# Patient Record
Sex: Male | Born: 1945 | Race: White | Hispanic: No | Marital: Married | Smoking: Former smoker
Health system: Southern US, Community
[De-identification: ages and names within clinical notes are randomized; demographics above are authoritative.]

## PROBLEM LIST (undated history)

## (undated) DIAGNOSIS — S6991XA Unspecified injury of right wrist, hand and finger(s), initial encounter: Secondary | ICD-10-CM

## (undated) DIAGNOSIS — K219 Gastro-esophageal reflux disease without esophagitis: Secondary | ICD-10-CM

## (undated) DIAGNOSIS — H269 Unspecified cataract: Secondary | ICD-10-CM

## (undated) DIAGNOSIS — IMO0002 Reserved for concepts with insufficient information to code with codable children: Secondary | ICD-10-CM

## (undated) DIAGNOSIS — Z87442 Personal history of urinary calculi: Secondary | ICD-10-CM

## (undated) DIAGNOSIS — I4892 Unspecified atrial flutter: Secondary | ICD-10-CM

## (undated) DIAGNOSIS — M1990 Unspecified osteoarthritis, unspecified site: Secondary | ICD-10-CM

## (undated) DIAGNOSIS — J8410 Pulmonary fibrosis, unspecified: Secondary | ICD-10-CM

## (undated) DIAGNOSIS — T7840XA Allergy, unspecified, initial encounter: Secondary | ICD-10-CM

## (undated) DIAGNOSIS — Z8719 Personal history of other diseases of the digestive system: Secondary | ICD-10-CM

## (undated) DIAGNOSIS — E785 Hyperlipidemia, unspecified: Secondary | ICD-10-CM

## (undated) DIAGNOSIS — I499 Cardiac arrhythmia, unspecified: Secondary | ICD-10-CM

## (undated) DIAGNOSIS — N200 Calculus of kidney: Secondary | ICD-10-CM

## (undated) HISTORY — DX: Reserved for concepts with insufficient information to code with codable children: IMO0002

## (undated) HISTORY — PX: HAND SURGERY: SHX662

## (undated) HISTORY — PX: UPPER GASTROINTESTINAL ENDOSCOPY: SHX188

## (undated) HISTORY — PX: TONSILLECTOMY: SUR1361

## (undated) HISTORY — DX: Gastro-esophageal reflux disease without esophagitis: K21.9

## (undated) HISTORY — DX: Hyperlipidemia, unspecified: E78.5

## (undated) HISTORY — DX: Unspecified cataract: H26.9

## (undated) HISTORY — DX: Unspecified atrial flutter: I48.92

## (undated) HISTORY — DX: Unspecified injury of right wrist, hand and finger(s), initial encounter: S69.91XA

## (undated) HISTORY — DX: Allergy, unspecified, initial encounter: T78.40XA

## (undated) HISTORY — DX: Pulmonary fibrosis, unspecified: J84.10

## (undated) HISTORY — PX: POLYPECTOMY: SHX149

---

## 2005-12-14 HISTORY — PX: ESOPHAGOGASTRODUODENOSCOPY: SHX1529

## 2005-12-14 LAB — HM COLONOSCOPY: HM Colonoscopy: 2007

## 2007-12-29 ENCOUNTER — Emergency Department (HOSPITAL_COMMUNITY): Admission: EM | Admit: 2007-12-29 | Discharge: 2007-12-29 | Payer: Self-pay | Admitting: Emergency Medicine

## 2007-12-30 ENCOUNTER — Emergency Department (HOSPITAL_COMMUNITY): Admission: EM | Admit: 2007-12-30 | Discharge: 2007-12-30 | Payer: Self-pay | Admitting: Emergency Medicine

## 2009-02-05 ENCOUNTER — Encounter: Payer: Self-pay | Admitting: Family Medicine

## 2010-01-29 ENCOUNTER — Ambulatory Visit: Payer: Self-pay | Admitting: Family Medicine

## 2010-01-29 DIAGNOSIS — K219 Gastro-esophageal reflux disease without esophagitis: Secondary | ICD-10-CM | POA: Insufficient documentation

## 2010-12-17 ENCOUNTER — Other Ambulatory Visit: Payer: Self-pay | Admitting: Family Medicine

## 2010-12-17 ENCOUNTER — Ambulatory Visit
Admission: RE | Admit: 2010-12-17 | Discharge: 2010-12-17 | Payer: Self-pay | Source: Home / Self Care | Attending: Family Medicine | Admitting: Family Medicine

## 2010-12-17 LAB — LIPID PANEL
Cholesterol: 212 mg/dL — ABNORMAL HIGH (ref 0–200)
HDL: 53.6 mg/dL (ref >39.00)
Total CHOL/HDL Ratio: 4
Triglycerides: 81 mg/dL (ref 0.0–149.0)
VLDL: 16.2 mg/dL (ref 0.0–40.0)

## 2010-12-17 LAB — CBC WITH DIFFERENTIAL/PLATELET
Basophils Absolute: 0 10*3/uL (ref 0.0–0.1)
Basophils Relative: 0.6 % (ref 0.0–3.0)
Eosinophils Absolute: 0.2 10*3/uL (ref 0.0–0.7)
Eosinophils Relative: 3 % (ref 0.0–5.0)
HCT: 45.5 % (ref 39.0–52.0)
Hemoglobin: 15.5 g/dL (ref 13.0–17.0)
Lymphocytes Relative: 34.6 % (ref 12.0–46.0)
Lymphs Abs: 2.8 10*3/uL (ref 0.7–4.0)
MCHC: 34.1 g/dL (ref 30.0–36.0)
MCV: 96.8 fl (ref 78.0–100.0)
Monocytes Absolute: 0.9 10*3/uL (ref 0.1–1.0)
Monocytes Relative: 10.6 % (ref 3.0–12.0)
Neutro Abs: 4.1 10*3/uL (ref 1.4–7.7)
Neutrophils Relative %: 51.2 % (ref 43.0–77.0)
Platelets: 262 10*3/uL (ref 150.0–400.0)
RBC: 4.7 Mil/uL (ref 4.22–5.81)
RDW: 13.8 % (ref 11.5–14.6)
WBC: 8.1 10*3/uL (ref 4.5–10.5)

## 2010-12-17 LAB — CONVERTED CEMR LAB
Blood in Urine, dipstick: NEGATIVE
Glucose, Urine, Semiquant: NEGATIVE
Nitrite: NEGATIVE
Specific Gravity, Urine: >=1.03
Urobilinogen, UA: 0.2
WBC Urine, dipstick: NEGATIVE
pH: 5.5

## 2010-12-17 LAB — TSH: TSH: 1.58 u[IU]/mL (ref 0.35–5.50)

## 2010-12-17 LAB — PSA: PSA: 0.67 ng/mL (ref 0.10–4.00)

## 2010-12-17 LAB — LDL CHOLESTEROL, DIRECT: Direct LDL: 137.9 mg/dL

## 2010-12-17 LAB — BASIC METABOLIC PANEL
BUN: 17 mg/dL (ref 6–23)
CO2: 30 mEq/L (ref 19–32)
Calcium: 9.3 mg/dL (ref 8.4–10.5)
Chloride: 101 mEq/L (ref 96–112)
Creatinine, Ser: 0.9 mg/dL (ref 0.4–1.5)
GFR: 89.28 mL/min (ref >60.00)
Glucose, Bld: 93 mg/dL (ref 70–99)
Potassium: 4.7 mEq/L (ref 3.5–5.1)
Sodium: 138 mEq/L (ref 135–145)

## 2010-12-17 LAB — HEPATIC FUNCTION PANEL
ALT: 16 U/L (ref 0–53)
AST: 26 U/L (ref 0–37)
Albumin: 3.9 g/dL (ref 3.5–5.2)
Alkaline Phosphatase: 54 U/L (ref 39–117)
Bilirubin, Direct: 0.1 mg/dL (ref 0.0–0.3)
Total Bilirubin: 0.8 mg/dL (ref 0.3–1.2)
Total Protein: 7.3 g/dL (ref 6.0–8.3)

## 2010-12-24 ENCOUNTER — Ambulatory Visit: Admit: 2010-12-24 | Payer: Self-pay | Admitting: Family Medicine

## 2011-01-10 ENCOUNTER — Encounter: Payer: Self-pay | Admitting: Family Medicine

## 2011-01-15 NOTE — Letter (Signed)
Summary: Records from [facility] [place]  Records from [facility] [place]   Imported By: Laural Benes 02/18/2010 11:09:19  _____________________________________________________________________  External Attachment:    Type:   Image     Comment:   External Document

## 2011-01-15 NOTE — Assessment & Plan Note (Signed)
Summary: NEW PT EST // RS   Vital Signs:  Patient profile:   65 year old male Height:      67.5 inches Weight:      188 pounds BMI:     29.12 Temp:     98.4 degrees F oral Pulse rate:   87 / minute BP sitting:   122 / 74  (left arm) Cuff size:   large  Vitals Entered By: Townsend Roger, [place] (January 29, 2010 2:49 PM) CC: establish   History of Present Illness: 65 yr old male to establish with [place] after transfering from Dr. Carolann Littler at what was then called [facility]. He is doing well in general but he is still strugglig with his right hand and wrist. This was damaged on 12-29-07 on the job when the hand was caught in a piece of equipment. He sustained severe crush injuries, fractures, and burns. he had a number of surgeries to repair the damage, and he lost the distal 2/3 of the right 4th finger. This was taken care of by Dr. Vernona Rieger of the Plastic Surgery Dept. at [facility]. The last time he saw Dr. Vernona Rieger was in June 2010. 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 He attempted to return to work with limitations, but he was unable to do anything since he is right handed, and this injury has left him with almost no use of the hand. He has numbness, weakness, and very limited ROM of the hand and fingers. He cannot grip very well. Of course he had extensive PT and OT for over a year, but he was able to regain almost no use of this hand. He is considering applying for permanent total disability but has not done so yet. Also, he has a hx of severe GERD. He has been taking Pantoprazole once a day for some time in the mornings, but he gets a lot of breakthrough symptoms in the evenings and during the nights.   Preventive Screening-Counseling & Management  Alcohol-Tobacco     Smoking Status: quit  Current Medications (verified): 1)  Protonix 40 Mg  Tbec (Pantoprazole Sodium) .... Once Daily  Allergies  (verified): No Known Drug Allergies  Past History:  Past Medical History: Ulcers, resolved GERD permanent damage to the right hand after a workplace injury on 12-29-07  Past Surgical History: multiple surgeries to the right hand in 2009 and 2010 per Dr. Vernona Rieger at [facility] colonoscopy 2007, several benign polyps removed, repeat in 5 yrs EGD in 2007, GERD only  Family History: Reviewed history and no changes required. Family History Diabetes 1st degree relative Family History of Prostate CA 1st degree relative <50  Social History: Reviewed history and no changes required. Married Former Smoker (quit in late 1980s) Alcohol use-no disabled Smoking Status:  quit  Review of Systems  The patient denies anorexia, fever, weight loss, weight gain, vision loss, decreased hearing, hoarseness, chest pain, syncope, dyspnea on exertion, peripheral edema, prolonged cough, headaches, hemoptysis, abdominal pain, melena, hematochezia, hematuria, incontinence, genital sores, muscle weakness, suspicious skin lesions, transient blindness, difficulty walking, depression, unusual weight change, abnormal bleeding, enlarged lymph nodes, angioedema, breast masses, and testicular masses.    Physical Exam  General:  Well-developed,well-nourished,in no acute distress; alert,appropriate and cooperative throughout examination Neck:  No deformities, masses, or tenderness noted. Lungs:  Normal respiratory effort, chest expands symmetrically. Lungs are clear to auscultation, no crackles or wheezes. Heart:  Normal rate and regular rhythm. S1 and S2 normal without gallop, murmur, click, rub or other extra sounds. Abdomen:  Bowel sounds positive,abdomen soft and non-tender without masses, organomegaly or hernias noted. Extremities:  right hand shows extensive burn scars, is missing the distal end of the 4th finger, fair  grip strength, unable to fully flex or extend the fingers   Impression &  Recommendations:  Problem # 1:  CRUSHING INJURY OF HAND (ICD-927.20)  Problem # 2:  BURN, THIRD DEGREE, HAND (ICD-944.30)  Problem # 3:  GERD (ICD-530.81)  His updated medication list for this problem includes:    Protonix 40 Mg Tbec (Pantoprazole sodium) .Marland Kitchen..Marland Kitchen Two times a day  Complete Medication List: 1)  Protonix 40 Mg Tbec (Pantoprazole sodium) .... Two times a day  Patient Instructions: 1)  get old records. we will have him come back soon for a cpx with labs. Increase Pantoprazole to two times a day  Prescriptions: PROTONIX 40 MG  TBEC (PANTOPRAZOLE SODIUM) two times a day  #180 x 3   Entered and Authorized by:   [name] MD   Signed by:   [name] MD on 01/29/2010   Method used:   Print then Give to Patient   RxID:   [id number]

## 2011-01-26 ENCOUNTER — Encounter: Payer: Self-pay | Admitting: Family Medicine

## 2011-01-26 ENCOUNTER — Ambulatory Visit (INDEPENDENT_AMBULATORY_CARE_PROVIDER_SITE_OTHER): Payer: PRIVATE HEALTH INSURANCE | Admitting: Family Medicine

## 2011-01-26 VITALS — BP 130/80 | HR 70 | Ht 68.0 in | Wt 192.0 lb

## 2011-01-26 DIAGNOSIS — K219 Gastro-esophageal reflux disease without esophagitis: Secondary | ICD-10-CM

## 2011-01-26 DIAGNOSIS — M79609 Pain in unspecified limb: Secondary | ICD-10-CM

## 2011-01-26 DIAGNOSIS — M79643 Pain in unspecified hand: Secondary | ICD-10-CM

## 2011-01-26 DIAGNOSIS — G4700 Insomnia, unspecified: Secondary | ICD-10-CM

## 2011-01-26 DIAGNOSIS — Z0000 Encounter for general adult medical examination without abnormal findings: Secondary | ICD-10-CM

## 2011-01-26 MED ORDER — PANTOPRAZOLE SODIUM 40 MG PO TBEC: 40.0000 mg | DELAYED_RELEASE_TABLET | Freq: Every day | ORAL | Status: DC

## 2011-01-26 MED ORDER — ZOLPIDEM TARTRATE 5 MG PO TABS: 5.0000 mg | ORAL_TABLET | Freq: Every evening | ORAL | Status: DC | PRN

## 2011-01-26 MED ORDER — CELECOXIB 200 MG PO CAPS: 200.0000 mg | ORAL_CAPSULE | Freq: Every day | ORAL | Status: DC

## 2011-01-26 NOTE — Progress Notes (Signed)
  Subjective:    Patient ID: Tanner Bishop, male    DOB: 1946/11/17, 65 y.o.   MRN: [id number]  HPI 65 yr old male for a cpx. He is doing well except for his chronic right hand pain. He was recently approved for disability, and he has settled his Worker's Comp claim.    Review of Systems  Constitutional: Negative.  Negative for fever, diaphoresis, activity change, appetite change, fatigue and unexpected weight change.  HENT: Negative.  Negative for hearing loss, ear pain, nosebleeds, congestion, sore throat, trouble swallowing, neck pain, neck stiffness, voice change and tinnitus.   Eyes: Negative.  Negative for photophobia, pain, discharge, redness and visual disturbance.  Respiratory: Negative.  Negative for apnea, cough, choking, chest tightness, shortness of breath, wheezing and stridor.   Cardiovascular: Negative.  Negative for chest pain, palpitations and leg swelling.  Gastrointestinal: Negative.  Negative for nausea, vomiting, abdominal pain, diarrhea, constipation, blood in stool, abdominal distention and rectal pain.  Genitourinary: Negative.  Negative for dysuria, urgency, frequency, flank pain, scrotal swelling, enuresis, difficulty urinating and testicular pain.  Musculoskeletal: Negative.  Negative for myalgias, back pain, joint swelling, arthralgias and gait problem.  Skin: Negative.  Negative for color change, pallor, rash and wound.  Neurological: Negative.  Negative for dizziness, tremors, seizures, syncope, speech difficulty, weakness, light-headedness, numbness and headaches.  Hematological: Negative.  Negative for adenopathy. Does not bruise/bleed easily.  Psychiatric/Behavioral: Negative.  Negative for hallucinations, behavioral problems, confusion, sleep disturbance, dysphoric mood and agitation. The patient is not nervous/anxious.        Objective:   Physical Exam  Constitutional: He appears well-developed and well-nourished. No distress.  HENT:  Head: Normocephalic  and atraumatic.  Right Ear: External ear normal.  Left Ear: External ear normal.  Nose: Nose normal.  Mouth/Throat: Oropharynx is clear and moist. No oropharyngeal exudate.  Eyes: Conjunctivae and EOM are normal. Pupils are equal, round, and reactive to light. Right eye exhibits no discharge. Left eye exhibits no discharge. No scleral icterus.  Neck: Normal range of motion. Neck supple. No JVD present. No thyromegaly present.  Cardiovascular: Normal rate, regular rhythm, normal heart sounds and intact distal pulses.  Exam reveals no gallop and no friction rub.   No murmur heard.      EKG normal   Pulmonary/Chest: Effort normal and breath sounds normal. No stridor. No respiratory distress. He has no wheezes. He has no rales. He exhibits no tenderness.  Abdominal: Soft. Normal appearance and bowel sounds are normal. He exhibits no distension, no abdominal bruit, no ascites and no mass. There is no hepatosplenomegaly. There is no tenderness. There is no rigidity, no rebound and no guarding. No hernia.  Musculoskeletal: Normal range of motion. He exhibits no edema and no tenderness.  Lymphadenopathy:    He has no cervical adenopathy.  Neurological: He is alert. He has normal reflexes. No cranial nerve deficit. He exhibits normal muscle tone. Coordination normal.  Skin: Skin is warm and dry. No rash noted. He is not diaphoretic. No erythema. No pallor.  Psychiatric: He has a normal mood and affect. His behavior is normal. Judgment and thought content normal.          Assessment & Plan:  Doing well. Refilled his meds.

## 2011-01-26 NOTE — Progress Notes (Signed)
  Subjective:    Patient ID: Tanner Bishop, male    DOB: July 03, 1946, 65 y.o.   MRN: [id number]  HPI    Review of Systems     Objective:   Physical Exam  Cardiovascular:       EKG normal           Assessment & Plan:

## 2011-02-23 ENCOUNTER — Other Ambulatory Visit: Payer: Self-pay | Admitting: *Deleted

## 2011-02-23 DIAGNOSIS — K219 Gastro-esophageal reflux disease without esophagitis: Secondary | ICD-10-CM

## 2011-02-23 NOTE — Telephone Encounter (Signed)
Protonix rx refill was written for qd but should have been written for bid.  Please correct and call in a new rx.

## 2011-02-24 MED ORDER — PANTOPRAZOLE SODIUM 40 MG PO TBEC: 40.0000 mg | DELAYED_RELEASE_TABLET | Freq: Two times a day (BID) | ORAL | Status: DC

## 2011-02-24 NOTE — Telephone Encounter (Signed)
rx for protonix changed to bid and resubmitted.

## 2011-07-06 ENCOUNTER — Telehealth: Payer: Self-pay | Admitting: Family Medicine

## 2011-07-06 NOTE — Telephone Encounter (Signed)
Pt now uses [employer] ins,and ins will not cover Pantoprazole 40 mg #60 without dr approval. Pt says that the insurance allowed pt 1 refill of this med, but will not fill any more until rcvng approval from dr.

## 2011-07-08 NOTE — Telephone Encounter (Signed)
I spoke with pt and he is going to call insurance company and get them to fax a form that states what they need.

## 2012-01-25 ENCOUNTER — Other Ambulatory Visit (INDEPENDENT_AMBULATORY_CARE_PROVIDER_SITE_OTHER): Payer: Medicare Other

## 2012-01-25 DIAGNOSIS — Z125 Encounter for screening for malignant neoplasm of prostate: Secondary | ICD-10-CM

## 2012-01-25 DIAGNOSIS — Z79899 Other long term (current) drug therapy: Secondary | ICD-10-CM

## 2012-01-25 DIAGNOSIS — Z0000 Encounter for general adult medical examination without abnormal findings: Secondary | ICD-10-CM

## 2012-01-25 LAB — BASIC METABOLIC PANEL
BUN: 16 mg/dL (ref 6–23)
CO2: 31 mEq/L (ref 19–32)
Calcium: 9 mg/dL (ref 8.4–10.5)
Chloride: 102 mEq/L (ref 96–112)
Creatinine, Ser: 1 mg/dL (ref 0.4–1.5)
GFR: 79.79 mL/min (ref >60.00)
Glucose, Bld: 98 mg/dL (ref 70–99)
Potassium: 4.8 mEq/L (ref 3.5–5.1)
Sodium: 138 mEq/L (ref 135–145)

## 2012-01-25 LAB — HEPATIC FUNCTION PANEL
ALT: 13 U/L (ref 0–53)
AST: 23 U/L (ref 0–37)
Albumin: 3.8 g/dL (ref 3.5–5.2)
Alkaline Phosphatase: 47 U/L (ref 39–117)
Bilirubin, Direct: 0.1 mg/dL (ref 0.0–0.3)
Total Bilirubin: 0.6 mg/dL (ref 0.3–1.2)
Total Protein: 6.9 g/dL (ref 6.0–8.3)

## 2012-01-25 LAB — POCT URINALYSIS DIPSTICK
Bilirubin, UA: NEGATIVE
Blood, UA: NEGATIVE
Glucose, UA: NEGATIVE
Ketones, UA: NEGATIVE
Leukocytes, UA: NEGATIVE
Nitrite, UA: NEGATIVE
Protein, UA: NEGATIVE
Spec Grav, UA: <=1.005
Urobilinogen, UA: 0.2
pH, UA: 6.5

## 2012-01-25 LAB — CBC WITH DIFFERENTIAL/PLATELET
Basophils Absolute: 0 10*3/uL (ref 0.0–0.1)
Basophils Relative: 0.6 % (ref 0.0–3.0)
Eosinophils Absolute: 0.2 10*3/uL (ref 0.0–0.7)
Eosinophils Relative: 2.3 % (ref 0.0–5.0)
HCT: 42.7 % (ref 39.0–52.0)
Hemoglobin: 14.4 g/dL (ref 13.0–17.0)
Lymphocytes Relative: 28.1 % (ref 12.0–46.0)
Lymphs Abs: 2.3 10*3/uL (ref 0.7–4.0)
MCHC: 33.8 g/dL (ref 30.0–36.0)
MCV: 96.5 fl (ref 78.0–100.0)
Monocytes Absolute: 0.8 10*3/uL (ref 0.1–1.0)
Monocytes Relative: 10.4 % (ref 3.0–12.0)
Neutro Abs: 4.7 10*3/uL (ref 1.4–7.7)
Neutrophils Relative %: 58.6 % (ref 43.0–77.0)
Platelets: 239 10*3/uL (ref 150.0–400.0)
RBC: 4.43 Mil/uL (ref 4.22–5.81)
RDW: 13.9 % (ref 11.5–14.6)
WBC: 8 10*3/uL (ref 4.5–10.5)

## 2012-01-25 LAB — PSA: PSA: 0.58 ng/mL (ref 0.10–4.00)

## 2012-01-25 LAB — TSH: TSH: 1.68 u[IU]/mL (ref 0.35–5.50)

## 2012-01-25 LAB — LIPID PANEL
Cholesterol: 189 mg/dL (ref 0–200)
HDL: 56.2 mg/dL (ref >39.00)
LDL Cholesterol: 122 mg/dL — ABNORMAL HIGH (ref 0–99)
Total CHOL/HDL Ratio: 3
Triglycerides: 53 mg/dL (ref 0.0–149.0)
VLDL: 10.6 mg/dL (ref 0.0–40.0)

## 2012-01-27 NOTE — Progress Notes (Signed)
Quick Note:  Spoke with pt ______

## 2012-02-01 ENCOUNTER — Ambulatory Visit (INDEPENDENT_AMBULATORY_CARE_PROVIDER_SITE_OTHER): Payer: Medicare Other | Admitting: Family Medicine

## 2012-02-01 ENCOUNTER — Encounter: Payer: Self-pay | Admitting: Family Medicine

## 2012-02-01 VITALS — BP 112/68 | HR 89 | Temp 98.2°F | Ht 69.0 in | Wt 188.0 lb

## 2012-02-01 DIAGNOSIS — K219 Gastro-esophageal reflux disease without esophagitis: Secondary | ICD-10-CM

## 2012-02-01 DIAGNOSIS — D126 Benign neoplasm of colon, unspecified: Secondary | ICD-10-CM

## 2012-02-01 DIAGNOSIS — Z0000 Encounter for general adult medical examination without abnormal findings: Secondary | ICD-10-CM

## 2012-02-01 DIAGNOSIS — K635 Polyp of colon: Secondary | ICD-10-CM

## 2012-02-01 DIAGNOSIS — G4700 Insomnia, unspecified: Secondary | ICD-10-CM

## 2012-02-01 MED ORDER — CELECOXIB 200 MG PO CAPS: 200.0000 mg | ORAL_CAPSULE | Freq: Every day | ORAL | Status: DC | PRN

## 2012-02-01 MED ORDER — ZOLPIDEM TARTRATE 5 MG PO TABS: 5.0000 mg | ORAL_TABLET | Freq: Every evening | ORAL | Status: DC | PRN

## 2012-02-01 MED ORDER — DICLOFENAC SODIUM 1 % TD GEL: 1.0000 "application " | Freq: Four times a day (QID) | TRANSDERMAL | Status: DC

## 2012-02-01 MED ORDER — PANTOPRAZOLE SODIUM 40 MG PO TBEC: 40.0000 mg | DELAYED_RELEASE_TABLET | Freq: Two times a day (BID) | ORAL | Status: DC

## 2012-02-01 NOTE — Progress Notes (Signed)
  Subjective:    Patient ID: Tanner Bishop, male    DOB: 12-03-1946, 66 y.o.   MRN: [id number]  HPI 66 yr old male for a cpx. He feels well but asks if there is a topical pain medicine he can use on his right hand. He has been on Celebrex for years.    Review of Systems  Constitutional: Negative.   HENT: Negative.   Eyes: Negative.   Respiratory: Negative.   Cardiovascular: Negative.   Gastrointestinal: Negative.   Genitourinary: Negative.   Musculoskeletal: Negative.   Skin: Negative.   Neurological: Negative.   Hematological: Negative.   Psychiatric/Behavioral: Negative.        Objective:   Physical Exam  Constitutional: He is oriented to person, place, and time. He appears well-developed and well-nourished. No distress.  HENT:  Head: Normocephalic and atraumatic.  Right Ear: External ear normal.  Left Ear: External ear normal.  Nose: Nose normal.  Mouth/Throat: Oropharynx is clear and moist. No oropharyngeal exudate.  Eyes: Conjunctivae and EOM are normal. Pupils are equal, round, and reactive to light. Right eye exhibits no discharge. Left eye exhibits no discharge. No scleral icterus.  Neck: Neck supple. No JVD present. No tracheal deviation present. No thyromegaly present.  Cardiovascular: Normal rate, regular rhythm, normal heart sounds and intact distal pulses.  Exam reveals no gallop and no friction rub.   No murmur heard.      EKG normal   Pulmonary/Chest: Effort normal and breath sounds normal. No respiratory distress. He has no wheezes. He has no rales. He exhibits no tenderness.  Abdominal: Soft. Bowel sounds are normal. He exhibits no distension and no mass. There is no tenderness. There is no rebound and no guarding.  Genitourinary: Rectum normal, prostate normal and penis normal. Guaiac negative stool. No penile tenderness.  Musculoskeletal: Normal range of motion. He exhibits no edema and no tenderness.  Lymphadenopathy:    He has no cervical adenopathy.    Neurological: He is alert and oriented to person, place, and time. He has normal reflexes. No cranial nerve deficit. He exhibits normal muscle tone. Coordination normal.  Skin: Skin is warm and dry. No rash noted. He is not diaphoretic. No erythema. No pallor.  Psychiatric: He has a normal mood and affect. His behavior is normal. Judgment and thought content normal.          Assessment & Plan:  Well exam. Try Diclofenac gel for his hand pain. Set up another colonoscopy.

## 2012-02-04 ENCOUNTER — Encounter: Payer: Self-pay | Admitting: Gastroenterology

## 2012-02-10 ENCOUNTER — Encounter: Payer: Self-pay | Admitting: Gastroenterology

## 2012-02-10 ENCOUNTER — Ambulatory Visit (AMBULATORY_SURGERY_CENTER): Payer: Medicare Other | Admitting: *Deleted

## 2012-02-10 VITALS — Ht 69.0 in | Wt 188.2 lb

## 2012-02-10 DIAGNOSIS — Z1211 Encounter for screening for malignant neoplasm of colon: Secondary | ICD-10-CM

## 2012-02-10 MED ORDER — MOVIPREP 100 G PO SOLR: ORAL | Status: DC

## 2012-02-24 ENCOUNTER — Encounter: Payer: Self-pay | Admitting: Gastroenterology

## 2012-02-24 ENCOUNTER — Ambulatory Visit (AMBULATORY_SURGERY_CENTER): Payer: Medicare Other | Admitting: Gastroenterology

## 2012-02-24 VITALS — BP 126/65 | HR 61 | Temp 96.0°F | Resp 22 | Ht 69.0 in | Wt 188.0 lb

## 2012-02-24 DIAGNOSIS — D126 Benign neoplasm of colon, unspecified: Secondary | ICD-10-CM

## 2012-02-24 DIAGNOSIS — Z800 Family history of malignant neoplasm of digestive organs: Secondary | ICD-10-CM

## 2012-02-24 DIAGNOSIS — Z1211 Encounter for screening for malignant neoplasm of colon: Secondary | ICD-10-CM

## 2012-02-24 MED ORDER — SODIUM CHLORIDE 0.9 % IV SOLN: 500.0000 mL | INTRAVENOUS | Status: DC

## 2012-02-24 NOTE — Patient Instructions (Signed)
YOU HAD AN ENDOSCOPIC PROCEDURE TODAY AT [facility]: Refer to the procedure report that was given to you for any specific questions about what was found during the examination.  If the procedure report does not answer your questions, please call your gastroenterologist to clarify.  If you requested that your care partner not be given the details of your procedure findings, then the procedure report has been included in a sealed envelope for you to review at your convenience later.  YOU SHOULD EXPECT: Some feelings of bloating in the abdomen. Passage of more gas than usual.  Walking can help get rid of the air that was put into your GI tract during the procedure and reduce the bloating. If you had a lower endoscopy (such as a colonoscopy or flexible sigmoidoscopy) you may notice spotting of blood in your stool or on the toilet paper. If you underwent a bowel prep for your procedure, then you may not have a normal bowel movement for a few days.  DIET: Your first meal following the procedure should be a light meal and then it is ok to progress to your normal diet.  A half-sandwich or bowl of soup is an example of a good first meal.  Heavy or fried foods are harder to digest and may make you feel nauseous or bloated.  Likewise meals heavy in dairy and vegetables can cause extra gas to form and this can also increase the bloating.  Drink plenty of fluids but you should avoid alcoholic beverages for 24 hours.  ACTIVITY: Your care partner should take you home directly after the procedure.  You should plan to take it easy, moving slowly for the rest of the day.  You can resume normal activity the day after the procedure however you should NOT DRIVE or use heavy machinery for 24 hours (because of the sedation medicines used during the test).    SYMPTOMS TO REPORT IMMEDIATELY: A gastroenterologist can be reached at any hour.  During normal business hours, 8:30 AM to 5:00 PM Monday through Friday,  call [phone].  After hours and on weekends, please call the GI answering service at [phone] who will take a message and have the physician on call contact you.   Following lower endoscopy (colonoscopy or flexible sigmoidoscopy):  Excessive amounts of blood in the stool  Significant tenderness or worsening of abdominal pains  Swelling of the abdomen that is new, acute  Fever of 100F or higher            FOLLOW UP: If any biopsies were taken you will be contacted by phone or by letter within the next 1-3 weeks.  Call your gastroenterologist if you have not heard about the biopsies in 3 weeks.  Our staff will call the home number listed on your records the next business day following your procedure to check on you and address any questions or concerns that you may have at that time regarding the information given to you following your procedure. This is a courtesy call and so if there is no answer at the home number and we have not heard from you through the emergency physician on call, we will assume that you have returned to your regular daily activities without incident.  SIGNATURES/CONFIDENTIALITY: You and/or your care partner have signed paperwork which will be entered into your electronic medical record.  These signatures attest to the fact that that the information above on your After Visit Summary has been reviewed and  is understood.  Full responsibility of the confidentiality of this discharge information lies with you and/or your care-partner.    Information on polyps given to you.

## 2012-02-24 NOTE — Progress Notes (Signed)
Propofol was administered by Levin Erp, CRNA. Maw  The pt tolerated the colonoscopy very well. Maw  Hung second bag of normal saline 0.9% 500 ml @ 09:15. Maw

## 2012-02-24 NOTE — Op Note (Signed)
[place] [address]. [place],   [id number]  COLONOSCOPY PROCEDURE REPORT  PATIENT:  Tanner Bishop, Tanner Bishop  MR#:  [id number] BIRTHDATE:  09/26/1946, 65 yrs. old  GENDER:  male ENDOSCOPIST:  Sandy Salaam. Deatra Ina, MD REF. BY: PROCEDURE DATE:  02/24/2012 PROCEDURE:  Colonoscopy with snare polypectomy ASA CLASS:  Class II INDICATIONS:  Screening, family history of colon cancer, history of pre-cancerous (adenomatous) colon polyps Sister 2008 colonoscopy - polyps MEDICATIONS:   MAC sedation, administered by CRNA propofol 193mIV  DESCRIPTION OF PROCEDURE:   After the risks benefits and alternatives of the procedure were thoroughly explained, informed consent was obtained.  No rectal exam performed. The [id number] was introduced through the anus and advanced to the cecum, which was identified by the ileocecal valve, without limitations.  The quality of the prep was good, using MoviPrep. The instrument was then slowly withdrawn as the colon was fully examined. <<PROCEDUREIMAGES>>  FINDINGS:  Four polyps were found. 1 polyp measuring 363min hepatic flexure, transverse, descending colon and sigmoid - removed with cold polypectomy snare and submitted to pathology (see image4, image5, image6, and image7).  Melanosis coli was found.  This was otherwise a normal examination of the colon (see image2 and image8).   Retroflexed views in the rectum revealed no abnormalities.    The time to cecum =  1) 7.0  minutes. The scope was then withdrawn in  1) 11.25  minutes from the cecum and the procedure completed. COMPLICATIONS:  None ENDOSCOPIC IMPRESSION: 1) Four polyps 2) Melanosis 3) Otherwise normal examination RECOMMENDATIONS: 1) Given your significant family history of colon cancer, you should have a repeat colonoscopy in 5 years REPEAT EXAM:  In 5 year(s) for Colonoscopy.  ______________________________ RoSandy SalaamKaDeatra InaMD  CC:  StLaurey Morale MD  n. eSLorrin MaisSandy SalaamKaplan at 02/24/2012 09:30 AM  OxGayla [id number]

## 2012-02-24 NOTE — Progress Notes (Signed)
Patient did not experience any of the following events: a burn prior to discharge; a fall within the facility; wrong site/side/patient/procedure/implant event; or a hospital transfer or hospital admission upon discharge from the facility. [phone]) Patient did not have preoperative order for IV antibiotic SSI prophylaxis. [phone])

## 2012-02-25 ENCOUNTER — Telehealth: Payer: Self-pay | Admitting: *Deleted

## 2012-02-25 NOTE — Telephone Encounter (Signed)
  Follow up Call-  Call back number 02/24/2012  Post procedure Call Back phone  # [phone]  Permission to leave phone message Yes     Patient questions:  Do you have a fever, pain , or abdominal swelling? no Pain Score  0 *  Have you tolerated food without any problems? yes  Have you been able to return to your normal activities? no  Do you have any questions about your discharge instructions: Diet   no Medications  no Follow up visit  no  Do you have questions or concerns about your Care? yes  Actions: * If pain score is 4 or above: No action needed, pain <4.

## 2012-02-29 ENCOUNTER — Telehealth: Payer: Self-pay | Admitting: Gastroenterology

## 2012-02-29 MED ORDER — HYDROCORTISONE ACETATE 25 MG RE SUPP: 25.0000 mg | Freq: Two times a day (BID) | RECTAL | Status: AC

## 2012-02-29 NOTE — Telephone Encounter (Signed)
Anusol HC suppositories one every morning and each bedtime for 7-10 days

## 2012-02-29 NOTE — Telephone Encounter (Signed)
Pt states that since his colon he is having rectal itching. He has tried prep. h and vaseline and states they have not helped. Pt wants to know what he can use. Please advise.

## 2012-02-29 NOTE — Telephone Encounter (Signed)
Pt aware and rx sent to pharmacy.

## 2012-03-01 ENCOUNTER — Encounter: Payer: Self-pay | Admitting: Gastroenterology

## 2012-09-13 DIAGNOSIS — Z0279 Encounter for issue of other medical certificate: Secondary | ICD-10-CM

## 2013-02-01 ENCOUNTER — Other Ambulatory Visit (INDEPENDENT_AMBULATORY_CARE_PROVIDER_SITE_OTHER): Payer: Medicare Other

## 2013-02-01 DIAGNOSIS — Z0000 Encounter for general adult medical examination without abnormal findings: Secondary | ICD-10-CM

## 2013-02-01 LAB — LIPID PANEL
Cholesterol: 184 mg/dL (ref 0–200)
HDL: 51.4 mg/dL (ref >39.00)
LDL Cholesterol: 117 mg/dL — ABNORMAL HIGH (ref 0–99)
Total CHOL/HDL Ratio: 4
Triglycerides: 77 mg/dL (ref 0.0–149.0)
VLDL: 15.4 mg/dL (ref 0.0–40.0)

## 2013-02-01 LAB — HEPATIC FUNCTION PANEL
ALT: 15 U/L (ref 0–53)
AST: 26 U/L (ref 0–37)
Albumin: 3.9 g/dL (ref 3.5–5.2)
Alkaline Phosphatase: 49 U/L (ref 39–117)
Bilirubin, Direct: 0.1 mg/dL (ref 0.0–0.3)
Total Bilirubin: 0.7 mg/dL (ref 0.3–1.2)
Total Protein: 7.1 g/dL (ref 6.0–8.3)

## 2013-02-01 LAB — CBC WITH DIFFERENTIAL/PLATELET
Basophils Absolute: 0 10*3/uL (ref 0.0–0.1)
Basophils Relative: 0.4 % (ref 0.0–3.0)
Eosinophils Absolute: 0.3 10*3/uL (ref 0.0–0.7)
Eosinophils Relative: 3.1 % (ref 0.0–5.0)
HCT: 43.5 % (ref 39.0–52.0)
Hemoglobin: 14.6 g/dL (ref 13.0–17.0)
Lymphocytes Relative: 28 % (ref 12.0–46.0)
Lymphs Abs: 2.3 10*3/uL (ref 0.7–4.0)
MCHC: 33.6 g/dL (ref 30.0–36.0)
MCV: 94.9 fl (ref 78.0–100.0)
Monocytes Absolute: 0.9 10*3/uL (ref 0.1–1.0)
Monocytes Relative: 11 % (ref 3.0–12.0)
Neutro Abs: 4.6 10*3/uL (ref 1.4–7.7)
Neutrophils Relative %: 57.5 % (ref 43.0–77.0)
Platelets: 274 10*3/uL (ref 150.0–400.0)
RBC: 4.58 Mil/uL (ref 4.22–5.81)
RDW: 13.9 % (ref 11.5–14.6)
WBC: 8.1 10*3/uL (ref 4.5–10.5)

## 2013-02-01 LAB — POCT URINALYSIS DIPSTICK
Bilirubin, UA: NEGATIVE
Blood, UA: NEGATIVE
Glucose, UA: NEGATIVE
Ketones, UA: NEGATIVE
Leukocytes, UA: NEGATIVE
Nitrite, UA: NEGATIVE
Protein, UA: NEGATIVE
Spec Grav, UA: 1.01
Urobilinogen, UA: 0.2
pH, UA: 6.5

## 2013-02-01 LAB — BASIC METABOLIC PANEL
BUN: 15 mg/dL (ref 6–23)
CO2: 28 mEq/L (ref 19–32)
Calcium: 9.3 mg/dL (ref 8.4–10.5)
Chloride: 102 mEq/L (ref 96–112)
Creatinine, Ser: 1 mg/dL (ref 0.4–1.5)
GFR: 78.63 mL/min (ref >60.00)
Glucose, Bld: 101 mg/dL — ABNORMAL HIGH (ref 70–99)
Potassium: 4.8 mEq/L (ref 3.5–5.1)
Sodium: 137 mEq/L (ref 135–145)

## 2013-02-01 LAB — PSA: PSA: 0.76 ng/mL (ref 0.10–4.00)

## 2013-02-01 LAB — TSH: TSH: 1.66 u[IU]/mL (ref 0.35–5.50)

## 2013-02-06 NOTE — Progress Notes (Signed)
Quick Note:  Pt has CPE on 02/08/13 will go over then. ______

## 2013-02-08 ENCOUNTER — Ambulatory Visit (INDEPENDENT_AMBULATORY_CARE_PROVIDER_SITE_OTHER)
Admission: RE | Admit: 2013-02-08 | Discharge: 2013-02-08 | Disposition: A | Discharge status: Home / Self Care | Payer: Medicare Other | Source: Ambulatory Visit | Attending: Family Medicine | Admitting: Family Medicine

## 2013-02-08 ENCOUNTER — Encounter: Payer: Self-pay | Admitting: Family Medicine

## 2013-02-08 ENCOUNTER — Ambulatory Visit (INDEPENDENT_AMBULATORY_CARE_PROVIDER_SITE_OTHER): Payer: Medicare Other | Admitting: Family Medicine

## 2013-02-08 VITALS — BP 114/80 | HR 90 | Temp 98.2°F | Ht 68.5 in | Wt 185.0 lb

## 2013-02-08 DIAGNOSIS — R059 Cough, unspecified: Secondary | ICD-10-CM

## 2013-02-08 DIAGNOSIS — K219 Gastro-esophageal reflux disease without esophagitis: Secondary | ICD-10-CM

## 2013-02-08 DIAGNOSIS — Z0000 Encounter for general adult medical examination without abnormal findings: Secondary | ICD-10-CM

## 2013-02-08 DIAGNOSIS — J849 Interstitial pulmonary disease, unspecified: Secondary | ICD-10-CM

## 2013-02-08 DIAGNOSIS — R05 Cough: Secondary | ICD-10-CM

## 2013-02-08 DIAGNOSIS — J8410 Pulmonary fibrosis, unspecified: Secondary | ICD-10-CM

## 2013-02-08 DIAGNOSIS — G4700 Insomnia, unspecified: Secondary | ICD-10-CM

## 2013-02-08 MED ORDER — ZOLPIDEM TARTRATE 5 MG PO TABS: 5.0000 mg | ORAL_TABLET | Freq: Every evening | ORAL | Status: DC | PRN

## 2013-02-08 MED ORDER — PANTOPRAZOLE SODIUM 40 MG PO TBEC: 40.0000 mg | DELAYED_RELEASE_TABLET | Freq: Two times a day (BID) | ORAL | Status: DC

## 2013-02-08 NOTE — Progress Notes (Signed)
  Subjective:    Patient ID: Tanner Bishop, male    DOB: [birth year], 67 y.o.   MRN: [id number]  HPI 67 yr old male for a cpx. He feel well in general but he again mentions a dry cough. He has had this for several years but it had not been much of a concern for him until lately. Over the past few months the cough has become more pronounced, and it mainly bothers him after exercise. He walks about 5 miles on his treadmill every day, and he has no problems while he is exercising. No coughing, no chest pain, no wheezing, no SOB. Then after he stops he will often begin to cough. This is never productive. He has never smoked and has never been exposed to smoke.    Review of Systems  Constitutional: Negative.   HENT: Negative.   Eyes: Negative.   Respiratory: Positive for cough. Negative for apnea, choking, chest tightness, shortness of breath, wheezing and stridor.   Cardiovascular: Negative.   Gastrointestinal: Negative.   Genitourinary: Negative.   Musculoskeletal: Negative.   Skin: Negative.   Neurological: Negative.   Psychiatric/Behavioral: Negative.        Objective:   Physical Exam  Constitutional: He is oriented to person, place, and time. He appears well-developed and well-nourished. No distress.  HENT:  Head: Normocephalic and atraumatic.  Right Ear: External ear normal.  Left Ear: External ear normal.  Nose: Nose normal.  Mouth/Throat: Oropharynx is clear and moist. No oropharyngeal exudate.  Eyes: Conjunctivae and EOM are normal. Pupils are equal, round, and reactive to light. Right eye exhibits no discharge. Left eye exhibits no discharge. No scleral icterus.  Neck: Neck supple. No JVD present. No tracheal deviation present. No thyromegaly present.  Cardiovascular: Normal rate, regular rhythm, normal heart sounds and intact distal pulses.  Exam reveals no gallop and no friction rub.   No murmur heard. EKG normal   Pulmonary/Chest: Effort normal and breath sounds normal. No  respiratory distress. He has no wheezes. He exhibits no tenderness.  There is a patch of dry crackles at the left posterior lung base which is new   Abdominal: Soft. Bowel sounds are normal. He exhibits no distension and no mass. There is no tenderness. There is no rebound and no guarding.  Genitourinary: Rectum normal, prostate normal and penis normal. Guaiac negative stool. No penile tenderness.  Musculoskeletal: Normal range of motion. He exhibits no edema and no tenderness.  Lymphadenopathy:    He has no cervical adenopathy.  Neurological: He is alert and oriented to person, place, and time. He has normal reflexes. No cranial nerve deficit. He exhibits normal muscle tone. Coordination normal.  Skin: Skin is warm and dry. No rash noted. He is not diaphoretic. No erythema. No pallor.  Psychiatric: He has a normal mood and affect. His behavior is normal. Judgment and thought content normal.          Assessment & Plan:  Well exam. He seems to be doing well except for the cough and now some new crackles in the LLL. We will get a CXR today to evaluate

## 2013-02-09 NOTE — Addendum Note (Signed)
Addended by: Alysia Penna A on: 02/09/2013 12:35 PM   Modules accepted: Orders

## 2013-02-10 NOTE — Progress Notes (Signed)
Quick Note:  I spoke with pt ______ 

## 2013-02-14 ENCOUNTER — Ambulatory Visit (INDEPENDENT_AMBULATORY_CARE_PROVIDER_SITE_OTHER)
Admission: RE | Admit: 2013-02-14 | Discharge: 2013-02-14 | Disposition: A | Discharge status: Home / Self Care | Payer: Medicare Other | Source: Ambulatory Visit | Attending: Family Medicine | Admitting: Family Medicine

## 2013-02-14 DIAGNOSIS — R05 Cough: Secondary | ICD-10-CM

## 2013-02-14 DIAGNOSIS — R059 Cough, unspecified: Secondary | ICD-10-CM

## 2013-02-14 MED ORDER — IOHEXOL 300 MG/ML  SOLN
80.0000 mL | Freq: Once | INTRAMUSCULAR | Status: AC | PRN
  Administered 2013-02-14: 80 mL via INTRAVENOUS

## 2013-02-15 NOTE — Progress Notes (Signed)
Quick Note:  I spoke with pt ______

## 2013-02-15 NOTE — Addendum Note (Signed)
Addended by: Alysia Penna A on: 02/15/2013 10:44 AM   Modules accepted: Orders

## 2013-02-16 ENCOUNTER — Other Ambulatory Visit (INDEPENDENT_AMBULATORY_CARE_PROVIDER_SITE_OTHER): Payer: Medicare Other

## 2013-02-16 ENCOUNTER — Ambulatory Visit (INDEPENDENT_AMBULATORY_CARE_PROVIDER_SITE_OTHER): Payer: Medicare Other | Admitting: Internal Medicine

## 2013-02-16 ENCOUNTER — Encounter: Payer: Self-pay | Admitting: Internal Medicine

## 2013-02-16 VITALS — BP 118/72 | HR 77 | Temp 98.0°F | Ht 68.0 in | Wt 188.2 lb

## 2013-02-16 DIAGNOSIS — R05 Cough: Secondary | ICD-10-CM

## 2013-02-16 DIAGNOSIS — J8410 Pulmonary fibrosis, unspecified: Secondary | ICD-10-CM

## 2013-02-16 DIAGNOSIS — R059 Cough, unspecified: Secondary | ICD-10-CM

## 2013-02-16 DIAGNOSIS — R053 Chronic cough: Secondary | ICD-10-CM

## 2013-02-16 DIAGNOSIS — J849 Interstitial pulmonary disease, unspecified: Secondary | ICD-10-CM

## 2013-02-16 LAB — RHEUMATOID FACTOR: Rhuematoid fact SerPl-aCnc: <10 IU/mL (ref <=14)

## 2013-02-16 LAB — CK: Total CK: 204 U/L (ref 7–232)

## 2013-02-16 LAB — SEDIMENTATION RATE: Sed Rate: 29 mm/hr — ABNORMAL HIGH (ref 0–22)

## 2013-02-16 MED ORDER — FLUTICASONE PROPIONATE 50 MCG/ACT NA SUSP: 2.0000 | Freq: Every day | NASAL | Status: DC

## 2013-02-16 NOTE — Patient Instructions (Addendum)
You have a condition called pulmonary fibrosis otherwise called interstitial lung diseaes But we do not know what type this is To figure this out do blood work and breathing test Return to see me after tests  Please  take generic fluticasone inhaler 2 squirts each nostril daily to see if this can help with cough  #Followup after tests, next 3 weeks

## 2013-02-16 NOTE — Assessment & Plan Note (Signed)
Probably due to interstitial lung disease. For the time being we'll start inhaled nasal steroid to cover for postnasal drip and empiric treatment

## 2013-02-16 NOTE — Addendum Note (Signed)
Addended by: Lilli Few on: 02/16/2013 04:35 PM   Modules accepted: Orders

## 2013-02-16 NOTE — Progress Notes (Signed)
Subjective:    Patient ID: Tanner Bishop, male    DOB: 1946/11/14, 67 y.o.   MRN: [id number]  HPI  PCP is Tanner Morale, MD   IOV 02/16/2013 c/o nagging cough. Insidious onset x 2 years. Progressive past year. Cough rated as severe at time. Triggered by aerosol sprays and lying flat. Cough improved nothing. Cough not improved with GERD Rx. Cough is associated with clearing but no tickle. He feels is deeper in lungs  Last year feb 2013 apparently cough was present but lung exam was clear and ppi doubled but cough did not clear. This year annual physical stated cough not better (feb 2014). Exam revealed crackles for first time per hx. This resuled in imaging and therefore here (imaging showed ILD)  #Sinus  - denies sinus issues or pnd  #allergies   - denies allergies  #GI  - has GERD x several years since 54s. On ppi that keeps it under control. 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 Started at age 9. 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, "different finishing chemicals" - Denies asbestos exposure, gold plate work, berrylium, oil and gas, titanium exposure - Denies mold exposure at home, denies using humidifier use - Denis birds as pet - Denies amiodarone use, or nitrafurantoin, chemotherapy or radiation   #Systemic disease  - denies RA, SLE,collagen vascular disease   Tanner Bishop Reflux Symptom Index (> 13-15 suggestive of LPR cough) 0 -> 5  =  none ->severe problem  Hoarseness of problem with voice 0  Clearing  Of Throat 1  Excess throat mucus or feeling of post nasal drip 1  Difficulty swallowing food, liquid or tablets 0  Cough after eating or lying down 2  Breathing difficulties or choking episodes 0  Troublesome or annoying cough 5  Sensation of something sticking in throat or lump in  throat 0  Heartburn, chest pain, indigestion, or stomach acid coming up 0  TOTAL 9     Kouffman Reflux v Neurogenic Cough Differentiator Reflux  Do you awaken from a sound sleep coughing violently?                            With trouble breathing? n  Do you have choking episodes when you cannot  Get enough air, gasping for air ?              n  Do you usually cough when you lie down into  The bed, or when you just lie down to rest ?                          n  Do you usually cough after meals or eating?         y  Do you cough when (or after) you bend over?    n  GERD SCORE  1  Kouffman Reflux v Neurogenic Cough Differentiator Neurogenic  Do you more-or-less cough all day long? y  Does change of temperature make you cough? y  Does laughing or chuckling cause you to cough? y  Do fumes (perfume, automobile fumes, burned  Toast, etc.,) cause you to cough ?      n  Does speaking, singing, or talking on the phone  cause you to cough   ?               n  Neurogenic/Airway score 3        Past Medical History  Diagnosis Date  . Ulcer     unresolved  . GERD (gastroesophageal reflux disease)   . Injury of right hand     permanent damage after workplace 2009 and 2010 [facility]     Family History  Problem Relation Age of Onset  . Diabetes    . Cancer      prostate  . Liver cancer Mother   . Diabetes Sister   . Lung cancer Sister   . Heart disease Brother   . Diabetes Brother   . Colon cancer Brother      History   Social History  . Marital Status: Married    Spouse Name: N/A    Number of Children: N/A  . Years of Education: N/A   Occupational History  . LABORER   . Disabled     Social History Main Topics  . Smoking status: Former Smoker -- 1.00 packs/day for 16 years    Types: Cigarettes    Quit date: 12/14/1978  . Smokeless tobacco: Never Used  . Alcohol Use: No  . Drug Use: No  . Sexually Active: Not on file   Other Topics Concern   . Not on file   Social History Narrative  . No narrative on file     No Known Allergies   Outpatient Prescriptions Prior to Visit  Medication Sig Dispense Refill  . bacitracin ophthalmic ointment Topically as needed to rt. hand      . diclofenac sodium (VOLTAREN) 1 % GEL Apply 1 application topically 4 (four) times daily.  100 g  11  . MOVIPREP 100 G SOLR movi prep as directed  1 each  0  . pantoprazole (PROTONIX) 40 MG tablet Take 1 tablet (40 mg total) by mouth 2 (two) times daily.  60 tablet  11  . zolpidem (AMBIEN) 5 MG tablet Take 1 tablet (5 mg total) by mouth at bedtime as needed.  30 tablet  5   No facility-administered medications prior to visit.       Review of Systems  Constitutional: Negative for fever and unexpected weight change.  HENT: Negative for ear pain, nosebleeds, congestion, sore throat, rhinorrhea, sneezing, trouble swallowing, dental problem, postnasal drip and sinus pressure.   Eyes: Negative for redness and itching.  Respiratory: Positive for cough. Negative for chest tightness, shortness of breath and wheezing.   Cardiovascular: Negative for palpitations and leg swelling.  Gastrointestinal: Negative for nausea and vomiting.  Genitourinary: Negative for dysuria.  Musculoskeletal: Negative for joint swelling.  Skin: Negative for rash.  Neurological: Negative for headaches.  Hematological: Does not bruise/bleed easily.  Psychiatric/Behavioral: Negative for dysphoric mood. The patient is not nervous/anxious.        Objective:   Physical Exam  Nursing note and vitals reviewed. Constitutional: He is oriented to person, place, and time. He appears well-developed and well-nourished. No distress.  HENT:  Head: Normocephalic and atraumatic.  Right Ear: External ear normal.  Left Ear: External ear normal.  Mouth/Throat: Oropharynx is clear and moist. No oropharyngeal exudate.  Mild post nasal drip  Eyes: Conjunctivae and EOM are normal. Pupils are  equal, round, and reactive to light. Right eye exhibits no discharge. Left eye exhibits no discharge. No scleral icterus.  Neck: Normal range of motion.  Neck supple. No JVD present. No tracheal deviation present. No thyromegaly present.  Cardiovascular: Normal rate, regular rhythm and intact distal pulses.  Exam reveals no gallop and no friction rub.   No murmur heard. Pulmonary/Chest: Effort normal. No respiratory distress. He has no wheezes. He has rales. He exhibits no tenderness.  Crackles half way up posteriorly  Abdominal: Soft. Bowel sounds are normal. He exhibits no distension and no mass. There is no tenderness. There is no rebound and no guarding.  Musculoskeletal: Normal range of motion. He exhibits no edema and no tenderness.  Lymphadenopathy:    He has no cervical adenopathy.  Neurological: He is alert and oriented to person, place, and time. He has normal reflexes. No cranial nerve deficit. Coordination normal.  Skin: Skin is warm and dry. No rash noted. He is not diaphoretic. No erythema. No pallor.  Psychiatric: He has a normal mood and affect. His behavior is normal. Judgment and thought content normal.          Assessment & Plan:

## 2013-02-16 NOTE — Assessment & Plan Note (Signed)
Discussed disease 1. Gave diagnosis of interstitial lung disease commonly pulmonary fibrosis 2. Explained that mouth is  An umbrella term with varied etiology and differential diagnosis (denies dust, fumes, birds, smoking, asbestos exposure) IPF, NSIP, autoimmune and various prognosis. 3. Explained etiologic workup involves autoimmune panel  4. seveity. determination needs pulmonary function testing 5. explained that new therapeutic options are available (aka pirfenidone)  Once he heard all this he became overwhelmed and in a matter of minutes was in tears. He said that he has always been healthy and is the first time he is dealt with her chronic disease. After this, had to get his wife from the waiting room and go over all of the above again. They both are in tears.  With time and therapeutic listening and validating emotions they were able to get some understanding of the above   PLAN   - Get autoimmune panel  - Get pulmonary function test -Review after the above to discuss further at which time I will address possible lung biopsy [currently that too overwhelmed to have this discussion]

## 2013-02-17 LAB — ANGIOTENSIN CONVERTING ENZYME: Angiotensin-Converting Enzyme: 31 U/L (ref 8–52)

## 2013-02-19 LAB — ANA: Anti Nuclear Antibody(ANA): NEGATIVE

## 2013-02-19 LAB — CYCLIC CITRUL PEPTIDE ANTIBODY, IGG: Cyclic Citrullin Peptide Ab: <2 U/mL (ref 0.0–5.0)

## 2013-02-20 LAB — SJOGRENS SYNDROME-A EXTRACTABLE NUCLEAR ANTIBODY: SSA (Ro) (ENA) Antibody, IgG: 16 AU/mL (ref <30)

## 2013-02-20 LAB — ANCA SCREEN W REFLEX TITER
Atypical p-ANCA Screen: NEGATIVE
c-ANCA Screen: NEGATIVE
p-ANCA Screen: NEGATIVE

## 2013-02-20 LAB — SJOGRENS SYNDROME-B EXTRACTABLE NUCLEAR ANTIBODY: SSB (La) (ENA) Antibody, IgG: 9 AU/mL (ref <30)

## 2013-02-20 LAB — ANTI-SCLERODERMA ANTIBODY: Scleroderma (Scl-70) (ENA) Antibody, IgG: <1 AU/mL (ref <30)

## 2013-02-23 LAB — HYPERSENSITIVITY PNUEMONITIS PROFILE

## 2013-03-03 ENCOUNTER — Ambulatory Visit (INDEPENDENT_AMBULATORY_CARE_PROVIDER_SITE_OTHER): Payer: Medicare Other | Admitting: Internal Medicine

## 2013-03-03 ENCOUNTER — Ambulatory Visit (HOSPITAL_COMMUNITY)
Admission: RE | Admit: 2013-03-03 | Discharge: 2013-03-03 | Disposition: A | Discharge status: Home / Self Care | Payer: Medicare Other | Source: Ambulatory Visit | Attending: Internal Medicine | Admitting: Internal Medicine

## 2013-03-03 ENCOUNTER — Encounter: Payer: Self-pay | Admitting: Internal Medicine

## 2013-03-03 VITALS — BP 130/70 | HR 76 | Temp 97.9°F | Ht 68.0 in | Wt 188.0 lb

## 2013-03-03 DIAGNOSIS — K219 Gastro-esophageal reflux disease without esophagitis: Secondary | ICD-10-CM

## 2013-03-03 DIAGNOSIS — J849 Interstitial pulmonary disease, unspecified: Secondary | ICD-10-CM

## 2013-03-03 DIAGNOSIS — R0989 Other specified symptoms and signs involving the circulatory and respiratory systems: Secondary | ICD-10-CM | POA: Insufficient documentation

## 2013-03-03 DIAGNOSIS — R059 Cough, unspecified: Secondary | ICD-10-CM | POA: Insufficient documentation

## 2013-03-03 DIAGNOSIS — R0609 Other forms of dyspnea: Secondary | ICD-10-CM | POA: Insufficient documentation

## 2013-03-03 DIAGNOSIS — J8410 Pulmonary fibrosis, unspecified: Secondary | ICD-10-CM | POA: Insufficient documentation

## 2013-03-03 DIAGNOSIS — R1310 Dysphagia, unspecified: Secondary | ICD-10-CM

## 2013-03-03 DIAGNOSIS — R05 Cough: Secondary | ICD-10-CM | POA: Insufficient documentation

## 2013-03-03 LAB — PULMONARY FUNCTION TEST

## 2013-03-03 MED ORDER — HYDROCOD POLST-CHLORPHEN POLST 10-8 MG/5ML PO LQCR: 5.0000 mL | Freq: Two times a day (BID) | ORAL | Status: DC | PRN

## 2013-03-03 MED ORDER — ALBUTEROL SULFATE (5 MG/ML) 0.5% IN NEBU
2.5000 mg | INHALATION_SOLUTION | Freq: Once | RESPIRATORY_TRACT | Status: AC
  Administered 2013-03-03: 2.5 mg via RESPIRATORY_TRACT

## 2013-03-03 NOTE — Patient Instructions (Addendum)
You have Intersititial Lung disease - Ddx is IPF, NSIP, Hypersensitivity Pneumonitis, Chronic eosinophilic pneumonia Refer you to thoracic surgery for lung biopsy Refer you to GI for choking issues Watch what you eat and eat slowly Please try tussionex 29m bid for cough Continue prilosec Return to see me after lung biopsy

## 2013-03-06 ENCOUNTER — Telehealth: Payer: Self-pay | Admitting: Gastroenterology

## 2013-03-06 DIAGNOSIS — R1310 Dysphagia, unspecified: Secondary | ICD-10-CM | POA: Insufficient documentation

## 2013-03-06 NOTE — Assessment & Plan Note (Signed)
Discussed disease 1. Gave diagnosis of interstitial lung disease not otherwise specified. Explained this is really an umbrella term the does not mean anything specific in terms of etiology, prognosis, treatment options without establishing a specific diagnosis 2. Explained varied etiology and varied differential diagnosis such as autoimmune lung disease [ruled out by blood test] and other differential diagnoses that includes diseases that most patients have never heard of. These are NSIP, chronic eosinophilic pneumonitis, hypersensitivity pneumonitis, sarcoidosis, idiopathic pulmonary fibrosis (IPF) which has a genetic component, BOOP/COP, and that smokers specific diagnosis such as RB-ILD, DIP. These are only a few of the many. Of these, explained IPF is the most severe disease which is progressive and fatal with a median survival of 3-7 years. 3. Explained etiologic workup involves autoimmune panel but this is negative in [place] 4. Explained because  autoimmune panel is negative patient definitely needs lung biopsy   - Gen. process of video-assisted thoracoscopic lung biopsy with the heel of greater than 90% and risk of general anesthesia, pain, hospitalization for a few days, bronchopleural fistula, local site infection, post biopsy neuralgia and rarely death and risk for pulmonary embolism explained 6. Explained symptom control possible; for cough with antitussives. For dyspnea with rehabilitation and hypoxemia with oxygen. But because currently his main symptom is cough we will prescribe Tussionex 7. explained specific treatment depends on specific diagnosis. Explained IPF has a new treatment called Pirfenidone as of 03/06/2013 the delay is a progression of fibrosis with potential mortality benefit  Based on all this they want to proceed with lung biopsy which I have highly recommended Followup after lung biopsy  > 50% of this > 25 min visit spent in face to face counseling (15 min visit  converted to 25 min)

## 2013-03-06 NOTE — Assessment & Plan Note (Signed)
It is known that acid reflux is associated with interstitial lung disease. There is no hiatal hernia and a CT scan of the chest. However he is describing possible dysphagia. In addition he is on daily proton pump inhibitor. Given the dysphagia for which the daughter is expressing significant concern I will refer him to gastroenterology services. He should have an expeditious workup. Family and patient agree with plan

## 2013-03-06 NOTE — Telephone Encounter (Signed)
Spoke with pt to schedule an OV with Dr. Deatra Ina. Pt states he does not want to schedule an appt until after his biopsy is done. Pt will call back to schedule.

## 2013-03-06 NOTE — Progress Notes (Signed)
Subjective:    Patient ID: Tanner Bishop, male    DOB: 1946-11-22, 67 y.o.   MRN: [id number]  HPI   IOV 02/16/2013 c/o nagging cough. Insidious onset x 2 years. Progressive past year. Cough rated as severe at time. Triggered by aerosol sprays and lying flat. Cough improved nothing. Cough not improved with GERD Rx. Cough is associated with clearing but no tickle. He feels is deeper in lungs  Last year feb 2013 apparently cough was present but lung exam was clear and ppi doubled but cough did not clear. This year annual physical stated cough not better (feb 2014). Exam revealed crackles for first time per hx. This resuled in imaging and therefore here (imaging showed ILD)  #Sinus  - denies sinus issues or pnd  #allergies   - denies allergies  #GI  - has GERD x several years since 28s. On ppi that keeps it under control. 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 Started at age 70. 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, "different finishing chemicals" - Denies asbestos exposure, gold plate work, berrylium, oil and gas, titanium exposure - Denies mold exposure at home, denies using humidifier use - Denis birds as pet - Denies amiodarone use, or nitrafurantoin, chemotherapy or radiation   #Systemic disease  - denies RA, SLE,collagen vascular disease   Dr Lorenza Cambridge Reflux Symptom Index (> 13-15 suggestive of LPR cough) 0 -> 5  =  none ->severe problem  Hoarseness of problem with voice 0  Clearing  Of Throat 1  Excess throat mucus or feeling of post nasal drip 1  Difficulty swallowing food, liquid or tablets 0  Cough after eating or lying down 2  Breathing difficulties or choking episodes 0  Troublesome or annoying cough 5  Sensation of something sticking in throat or lump in throat 0  Heartburn, chest pain,  indigestion, or stomach acid coming up 0  TOTAL 9     Kouffman Reflux v Neurogenic Cough Differentiator Reflux  Do you awaken from a sound sleep coughing violently?                            With trouble breathing? n  Do you have choking episodes when you cannot  Get enough air, gasping for air ?              n  Do you usually cough when you lie down into  The bed, or when you just lie down to rest ?                          n  Do you usually cough after meals or eating?         y  Do you cough when (or after) you bend over?    n  GERD SCORE  1  Kouffman Reflux v Neurogenic Cough Differentiator Neurogenic  Do you more-or-less cough all day long? y  Does change of temperature make you cough? y  Does laughing or chuckling cause you to cough? y  Do fumes (perfume, automobile fumes, burned  Toast, etc.,) cause you to cough ?      n  Does speaking, singing, or talking on the phone cause you to cough   ?  n  Neurogenic/Airway score 3    OV 03/06/2013 [name] presents. following workup for interstitial lung disease. He had serum autoimmune antibodies and hypersensitivity pneumonitis profile. All this is negative. Essentially he does not have autoimmune lung disease. He did a pulmonary function test with a forced vital capacity 66%. However he coughed a lot that the accuracy of this is questionable. He did not have diffusion capacity assessment because of his cough.  He is here to discuss the next step in his evaluation and management. Wife is here with him. Daughter Ladell Heads accompanies them for the first time. Daughter is extremely concerned about the possibility of idiopathic pulmonary fibrosis [IPF]. Daughter has medical background and asks very very intelligent questions. She has extremely good insight about interstitial lung disease in general. They're also requesting palliative treatment of cough while going to work up.  The also bring up a new issue that apparently  when patient eats his significant amount of dysphagia/choking. It is unclear at this stage if this is related to cough as the primary phenomena or dysphagia as primary phenomena. He is never been evaluated for this.  Review of Systems    positive for cough otherwise negative Objective:   Physical Exam  Pleasant, alert and oriented. Has capacity. Has basal crackles.  Rest of  exam not done. This is a discussion only visit         Assessment & Plan:

## 2013-03-13 ENCOUNTER — Other Ambulatory Visit: Payer: Self-pay | Admitting: *Deleted

## 2013-03-13 DIAGNOSIS — J984 Other disorders of lung: Secondary | ICD-10-CM

## 2013-03-14 ENCOUNTER — Institutional Professional Consult (permissible substitution) (INDEPENDENT_AMBULATORY_CARE_PROVIDER_SITE_OTHER): Payer: Self-pay | Admitting: Thoracic Surgery (Cardiothoracic Vascular Surgery)

## 2013-03-14 ENCOUNTER — Other Ambulatory Visit: Payer: Self-pay | Admitting: *Deleted

## 2013-03-14 ENCOUNTER — Encounter: Payer: Self-pay | Admitting: Thoracic Surgery (Cardiothoracic Vascular Surgery)

## 2013-03-14 VITALS — BP 132/82 | HR 65 | Resp 16 | Ht 69.0 in | Wt 185.0 lb

## 2013-03-14 DIAGNOSIS — J849 Interstitial pulmonary disease, unspecified: Secondary | ICD-10-CM

## 2013-03-14 DIAGNOSIS — J8410 Pulmonary fibrosis, unspecified: Secondary | ICD-10-CM

## 2013-03-14 DIAGNOSIS — K219 Gastro-esophageal reflux disease without esophagitis: Secondary | ICD-10-CM

## 2013-03-14 NOTE — Progress Notes (Signed)
PCP is [name], MD Referring Provider is Brand Males, MD  Chief Complaint  Patient presents with  . Cough    ILD...referred for VATS BX    HPI: 67 year old male presents with chief complaint of cough.  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 About 2 years ago he developed a dry cough. This has been persistent ever since. He tried cough medication which helped a little. But it never completely resolved. Sometimes aggravated by exertion and lying flat. He also has paroxysms of coughing. He denies any shortness of breath except towards the end of his 5 miles on the treadmill that he does daily. He's not had any chest pain. He denies any travel to any  unusual locations. He does not have birds for pets. He does have a history of reflux dating back to 88s. Doubling of his proton pump inhibitor did not improve his cough.  He saw [name] and had a CT of the chest done. It showed interstitial lung disease bilaterally. He now presents for consideration for lung biopsy.  Past Medical History  Diagnosis Date  . Ulcer     unresolved  . GERD (gastroesophageal reflux disease)   . Injury of right hand     permanent damage after workplace 2009 and 2010 Dr Langston [facility]    Past Surgical History  Procedure Laterality Date  . Colonoscopy  02-24-12    adenomatous polyps removed, repeat in 5 yrs, per Dr. Deatra Ina   . Esophagogastroduodenoscopy  2007    Family History  Problem Relation Age of Onset  . Diabetes    . Cancer      prostate  . Liver cancer Mother   . Diabetes Sister   . Lung cancer Sister   . Heart disease Brother   . Diabetes Brother   . Colon cancer Brother     Social History History  Substance Use Topics  . Smoking status: Former Smoker -- 1.00 packs/day for 16 years    Types: Cigarettes    Quit date: 12/14/1978  . Smokeless tobacco: Never Used  . Alcohol Use: No    Current  Outpatient Prescriptions  Medication Sig Dispense Refill  . bacitracin ophthalmic ointment Topically as needed to rt. hand      . chlorpheniramine-HYDROcodone (TUSSIONEX) 10-8 MG/5ML LQCR Take 5 mLs by mouth every 12 (twelve) hours as needed.  140 mL  0  . diclofenac sodium (VOLTAREN) 1 % GEL Apply 1 application topically 4 (four) times daily.  100 g  11  . fluticasone (FLONASE) 50 MCG/ACT nasal spray Place 2 sprays into the nose daily.  16 g  2  . pantoprazole (PROTONIX) 40 MG tablet Take 1 tablet (40 mg total) by mouth 2 (two) times daily.  60 tablet  11  . zolpidem (AMBIEN) 5 MG tablet Take 1 tablet (5 mg total) by mouth at bedtime as needed.  30 tablet  5   No current facility-administered medications for this visit.    No Known Allergies  Review of Systems  Constitutional: Negative for activity change and appetite change.  Respiratory: Positive for cough.   Gastrointestinal:       Reflux and some difficulty swallowing  All other systems reviewed and are negative.    BP 132/82  Pulse 65  Resp 16  Ht 5' 9" (1.753 m)  Wt 185 lb (83.915 kg)  BMI 27.31 kg/m2  SpO2 99% Physical Exam  Vitals reviewed. Constitutional:  He is oriented to person, place, and time. He appears well-developed and well-nourished. No distress.  HENT:  Head: Normocephalic and atraumatic.  Eyes: EOM are normal. Pupils are equal, round, and reactive to light.  Neck: Neck supple. No thyromegaly present.  Cardiovascular: Normal rate, regular rhythm, normal heart sounds and intact distal pulses.  Exam reveals no gallop and no friction rub.   No murmur heard. Pulmonary/Chest: Effort normal. He has no wheezes. He has rales.  Abdominal: Soft. There is no tenderness.  Musculoskeletal: He exhibits no edema.  Lymphadenopathy:    He has no cervical adenopathy.  Neurological: He is alert and oriented to person, place, and time. No cranial nerve deficit.  Skin: Skin is warm and dry.     Diagnostic Tests: CT of  chest 02/14/2013 CT CHEST WITH CONTRAST  Technique: Multidetector CT imaging of the chest was performed  following the standard protocol during bolus administration of  intravenous contrast.  Contrast: 52m OMNIPAQUE IOHEXOL 300 MG/ML SOLN  Comparison: Chest x-ray 02/08/2013.  Findings:  Mediastinum: Heart size is mildly enlarged (with dilatation of the  left ventricle). There is no significant pericardial fluid,  thickening or pericardial calcification. Numerous reactive size  mediastinal and bilateral hilar lymph nodes. No pathologically  enlarged mediastinal or hilar lymph nodes. Esophagus is  unremarkable in appearance.  Lungs/Pleura: Throughout the lungs bilaterally in a strong basal  distribution there are patchy areas of ground-glass attenuation  with some subpleural reticulation and traction bronchiectasis as  well as peripheral bronchiolectasis. At this time, there is no  definite honeycombing confidently identified (the findings in the  lower lobes are attributable entirely to bronchiectasis at this  time). No acute consolidative air space disease. No pleural  effusions. Inspiratory and expiratory imaging is unremarkable.  Upper Abdomen: Unremarkable.  Musculoskeletal: There are no aggressive appearing lytic or blastic  lesions noted in the visualized portions of the skeleton.  IMPRESSION:  1. The appearance of the lungs is compatible with an underlying  interstitial lung disease, as detailed above. Given the  predominance of ground-glass attenuation and lack of frank  honeycombing at this time, the findings are most concerning for  nonspecific interstitial pneumonia (NSIP). However, there is a  very strong craniocaudal gradient such that the possibility of  early usual interstitial pneumonia (UIP) is not entirely excluded.  Attention on a repeat high resolution chest CT in 1 year would be  useful to assess for temporal progression of disease.  2. Borderline enlarged  mediastinal and hilar lymph nodes are  presumably reactive in the setting of interstitial lung disease.  3. Cardiomegaly with left ventricular dilatation.  Original Report Authenticated By: DVinnie Langton M.D.   Impression: 67year old gentleman with interstitial lung disease of uncertain etiology. He needs a lung biopsy to clarify the type of interstitial lung disease and guide treatment. I discussed the general nature of the procedure with the patient and his wife. They understand that it is a diagnostic and therapeutic procedure. We discussed the indications, risks, benefits, and alternatives. They understand that the risks include, but are not limited to, death, MI, DVT, PE, bleeding, possible need for transfusion, infection, cardiac arrhythmias, prolonged air leaks, as well as the possibility of unforeseeable complications. They do understand it is possible that there could be a failure to make a definitive diagnosis. She understands and accepts the risks and wishes to proceed.  Plan: Left VATS, lung biopsy on Wednesday, 03/22/2013.

## 2013-03-17 ENCOUNTER — Encounter (HOSPITAL_COMMUNITY): Payer: Self-pay

## 2013-03-20 ENCOUNTER — Ambulatory Visit (HOSPITAL_COMMUNITY)
Admission: RE | Admit: 2013-03-20 | Discharge: 2013-03-20 | Disposition: A | Discharge status: Home / Self Care | Payer: Medicare Other | Source: Ambulatory Visit | Attending: Thoracic Surgery (Cardiothoracic Vascular Surgery) | Admitting: Thoracic Surgery (Cardiothoracic Vascular Surgery)

## 2013-03-20 ENCOUNTER — Encounter (HOSPITAL_COMMUNITY)
Admission: RE | Admit: 2013-03-20 | Discharge: 2013-03-20 | Disposition: A | Discharge status: Home / Self Care | Payer: Medicare Other | Source: Ambulatory Visit | Attending: Thoracic Surgery (Cardiothoracic Vascular Surgery) | Admitting: Thoracic Surgery (Cardiothoracic Vascular Surgery)

## 2013-03-20 ENCOUNTER — Encounter (HOSPITAL_COMMUNITY): Payer: Self-pay

## 2013-03-20 VITALS — BP 137/77 | HR 67 | Temp 97.6°F | Resp 18 | Ht 69.0 in | Wt 188.1 lb

## 2013-03-20 DIAGNOSIS — Z01810 Encounter for preprocedural cardiovascular examination: Secondary | ICD-10-CM | POA: Insufficient documentation

## 2013-03-20 DIAGNOSIS — Z01812 Encounter for preprocedural laboratory examination: Secondary | ICD-10-CM | POA: Insufficient documentation

## 2013-03-20 DIAGNOSIS — J849 Interstitial pulmonary disease, unspecified: Secondary | ICD-10-CM

## 2013-03-20 DIAGNOSIS — J8410 Pulmonary fibrosis, unspecified: Secondary | ICD-10-CM | POA: Insufficient documentation

## 2013-03-20 DIAGNOSIS — Z01818 Encounter for other preprocedural examination: Secondary | ICD-10-CM | POA: Insufficient documentation

## 2013-03-20 HISTORY — DX: Unspecified osteoarthritis, unspecified site: M19.90

## 2013-03-20 LAB — SURGICAL PCR SCREEN
MRSA, PCR: NEGATIVE
Staphylococcus aureus: NEGATIVE

## 2013-03-20 LAB — BLOOD GAS, ARTERIAL
Acid-Base Excess: 2.2 mmol/L — ABNORMAL HIGH (ref 0.0–2.0)
Bicarbonate: 26.7 mEq/L — ABNORMAL HIGH (ref 20.0–24.0)
Drawn by: 206361
FIO2: 0.21 %
O2 Saturation: 95.5 %
Patient temperature: 98.6
TCO2: 28 mmol/L (ref 0–100)
pCO2 arterial: 45 mmHg (ref 35.0–45.0)
pH, Arterial: 7.39 (ref 7.350–7.450)
pO2, Arterial: 77.3 mmHg — ABNORMAL LOW (ref 80.0–100.0)

## 2013-03-20 LAB — CBC
HCT: 43.2 % (ref 39.0–52.0)
Hemoglobin: 14.9 g/dL (ref 13.0–17.0)
MCH: 31.5 pg (ref 26.0–34.0)
MCHC: 34.5 g/dL (ref 30.0–36.0)
MCV: 91.3 fL (ref 78.0–100.0)
Platelets: 203 10*3/uL (ref 150–400)
RBC: 4.73 MIL/uL (ref 4.22–5.81)
RDW: 13.1 % (ref 11.5–15.5)
WBC: 7.9 10*3/uL (ref 4.0–10.5)

## 2013-03-20 LAB — COMPREHENSIVE METABOLIC PANEL
ALT: 9 U/L (ref 0–53)
AST: 24 U/L (ref 0–37)
Albumin: 3.8 g/dL (ref 3.5–5.2)
Alkaline Phosphatase: 54 U/L (ref 39–117)
BUN: 14 mg/dL (ref 6–23)
CO2: 26 mEq/L (ref 19–32)
Calcium: 9.3 mg/dL (ref 8.4–10.5)
Chloride: 104 mEq/L (ref 96–112)
Creatinine, Ser: 0.97 mg/dL (ref 0.50–1.35)
GFR calc Af Amer: >90 mL/min (ref >90)
GFR calc non Af Amer: 85 mL/min — ABNORMAL LOW (ref >90)
Glucose, Bld: 102 mg/dL — ABNORMAL HIGH (ref 70–99)
Potassium: 4.8 mEq/L (ref 3.5–5.1)
Sodium: 138 mEq/L (ref 135–145)
Total Bilirubin: 0.2 mg/dL — ABNORMAL LOW (ref 0.3–1.2)
Total Protein: 7.5 g/dL (ref 6.0–8.3)

## 2013-03-20 LAB — PROTIME-INR
INR: 0.96 (ref 0.00–1.49)
Prothrombin Time: 12.7 seconds (ref 11.6–15.2)

## 2013-03-20 LAB — URINALYSIS, ROUTINE W REFLEX MICROSCOPIC
Bilirubin Urine: NEGATIVE
Glucose, UA: NEGATIVE mg/dL
Hgb urine dipstick: NEGATIVE
Ketones, ur: NEGATIVE mg/dL
Leukocytes, UA: NEGATIVE
Nitrite: NEGATIVE
Protein, ur: NEGATIVE mg/dL
Specific Gravity, Urine: 1.011 (ref 1.005–1.030)
Urobilinogen, UA: 0.2 mg/dL (ref 0.0–1.0)
pH: 6 (ref 5.0–8.0)

## 2013-03-20 LAB — TYPE AND SCREEN
ABO/RH(D): A POS
Antibody Screen: NEGATIVE

## 2013-03-20 LAB — APTT: aPTT: 30 seconds (ref 24–37)

## 2013-03-20 LAB — ABO/RH: ABO/RH(D): A POS

## 2013-03-20 NOTE — Pre-Procedure Instructions (Signed)
Cortlin Votta  03/20/2013   Your procedure is scheduled on:  03-22-2013  Wednesday   Report to [facility] at 6:30 AM. [place] to 3rd floor   Call this number if you have problems the morning of surgery: [phone]   Remember:   Do not eat food or drink liquids after midnight.   Take these medicines the morning of surgery with A SIP OF WATER: pantoprazole(Protonix)   Do not wear jewelry  Do not wear lotions, powders, or perfumes. You may wear deodorant.  Do not shave 48 hours prior to surgery. Men may shave face and neck.  Do not bring valuables to the hospital.  Contacts, dentures or bridgework may not be worn into surgery.  Leave suitcase in the car. After surgery it may be brought to your room.   For patients admitted to the hospital, checkout time is 11:00 AM the day of discharge.    Patients discharged the day of surgery will not be allowed to drive home.   Name and phone number of your driver:    Special Instructions: Shower using CHG 2 nights before surgery and the night before surgery.  If you shower the day of surgery use CHG.  Use special wash - you have one bottle of CHG for all showers.  You should use approximately 1/3 of the bottle for each shower.   Please read over the following fact sheets that you were given: Pain Booklet, Coughing and Deep Breathing and Surgical Site Infection Prevention

## 2013-03-21 MED ORDER — DEXTROSE 5 % IV SOLN
1.5000 g | INTRAVENOUS | Status: AC
  Administered 2013-03-22: 1.5 g via INTRAVENOUS
  Filled 2013-03-21: qty 1.5

## 2013-03-22 ENCOUNTER — Encounter (HOSPITAL_COMMUNITY): Payer: Self-pay | Admitting: Certified Registered"

## 2013-03-22 ENCOUNTER — Inpatient Hospital Stay (HOSPITAL_COMMUNITY): Payer: Medicare Other

## 2013-03-22 ENCOUNTER — Encounter (HOSPITAL_COMMUNITY)
Admission: RE | Disposition: A | Discharge status: Home / Self Care | Payer: Self-pay | Source: Ambulatory Visit | Attending: Thoracic Surgery (Cardiothoracic Vascular Surgery)

## 2013-03-22 ENCOUNTER — Ambulatory Visit (HOSPITAL_COMMUNITY): Payer: Medicare Other

## 2013-03-22 ENCOUNTER — Inpatient Hospital Stay (HOSPITAL_COMMUNITY)
Admission: RE | Admit: 2013-03-22 | Discharge: 2013-03-24 | DRG: 168 | Disposition: A | Discharge status: Home / Self Care | Payer: Medicare Other | Source: Ambulatory Visit | Attending: Thoracic Surgery (Cardiothoracic Vascular Surgery) | Admitting: Thoracic Surgery (Cardiothoracic Vascular Surgery)

## 2013-03-22 ENCOUNTER — Ambulatory Visit (HOSPITAL_COMMUNITY): Payer: Medicare Other | Admitting: Certified Registered"

## 2013-03-22 DIAGNOSIS — Z87891 Personal history of nicotine dependence: Secondary | ICD-10-CM

## 2013-03-22 DIAGNOSIS — J84112 Idiopathic pulmonary fibrosis: Secondary | ICD-10-CM | POA: Insufficient documentation

## 2013-03-22 DIAGNOSIS — J849 Interstitial pulmonary disease, unspecified: Secondary | ICD-10-CM

## 2013-03-22 DIAGNOSIS — Z79899 Other long term (current) drug therapy: Secondary | ICD-10-CM

## 2013-03-22 DIAGNOSIS — K219 Gastro-esophageal reflux disease without esophagitis: Secondary | ICD-10-CM | POA: Diagnosis present

## 2013-03-22 DIAGNOSIS — J8410 Pulmonary fibrosis, unspecified: Principal | ICD-10-CM | POA: Diagnosis present

## 2013-03-22 DIAGNOSIS — R599 Enlarged lymph nodes, unspecified: Secondary | ICD-10-CM | POA: Diagnosis present

## 2013-03-22 DIAGNOSIS — Z801 Family history of malignant neoplasm of trachea, bronchus and lung: Secondary | ICD-10-CM

## 2013-03-22 DIAGNOSIS — I517 Cardiomegaly: Secondary | ICD-10-CM | POA: Diagnosis present

## 2013-03-22 HISTORY — PX: VIDEO ASSISTED THORACOSCOPY: SHX5073

## 2013-03-22 HISTORY — PX: LUNG BIOPSY: SHX5088

## 2013-03-22 SURGERY — VIDEO ASSISTED THORACOSCOPY
Anesthesia: General | Site: Chest | Laterality: Left | Wound class: Clean Contaminated

## 2013-03-22 MED ORDER — KETOROLAC TROMETHAMINE 30 MG/ML IJ SOLN
30.0000 mg | Freq: Four times a day (QID) | INTRAMUSCULAR | Status: DC
  Administered 2013-03-22 – 2013-03-24 (×7): 30 mg via INTRAVENOUS
  Filled 2013-03-22 (×15): qty 1

## 2013-03-22 MED ORDER — OXYCODONE-ACETAMINOPHEN 5-325 MG PO TABS: 1.0000 | ORAL_TABLET | ORAL | Status: DC | PRN

## 2013-03-22 MED ORDER — 0.9 % SODIUM CHLORIDE (POUR BTL) OPTIME
TOPICAL | Status: DC | PRN
  Administered 2013-03-22: 1000 mL

## 2013-03-22 MED ORDER — FENTANYL 10 MCG/ML IV SOLN
INTRAVENOUS | Status: DC
  Administered 2013-03-22: 13:00:00 via INTRAVENOUS
  Administered 2013-03-22: 170 ug via INTRAVENOUS
  Administered 2013-03-22: 80 ug via INTRAVENOUS
  Administered 2013-03-22: 20 ug via INTRAVENOUS
  Administered 2013-03-23: 03:00:00 via INTRAVENOUS
  Administered 2013-03-23: 70 ug via INTRAVENOUS
  Administered 2013-03-23 (×2): 30 ug via INTRAVENOUS
  Administered 2013-03-23: 210 ug via INTRAVENOUS
  Administered 2013-03-24: 70 ug via INTRAVENOUS
  Filled 2013-03-22 (×2): qty 50

## 2013-03-22 MED ORDER — VECURONIUM BROMIDE 10 MG IV SOLR
INTRAVENOUS | Status: DC | PRN
  Administered 2013-03-22 (×2): 2 mg via INTRAVENOUS

## 2013-03-22 MED ORDER — ROCURONIUM BROMIDE 100 MG/10ML IV SOLN
INTRAVENOUS | Status: DC | PRN
Start: 2013-03-22 — End: 2013-03-22
  Administered 2013-03-22: 50 mg via INTRAVENOUS

## 2013-03-22 MED ORDER — MIDAZOLAM HCL 5 MG/5ML IJ SOLN
INTRAMUSCULAR | Status: DC | PRN
  Administered 2013-03-22 (×2): 1 mg via INTRAVENOUS

## 2013-03-22 MED ORDER — ONDANSETRON HCL 4 MG/2ML IJ SOLN
INTRAMUSCULAR | Status: DC | PRN
  Administered 2013-03-22: 4 mg via INTRAVENOUS

## 2013-03-22 MED ORDER — TRAMADOL HCL 50 MG PO TABS: 50.0000 mg | ORAL_TABLET | Freq: Four times a day (QID) | ORAL | Status: DC | PRN

## 2013-03-22 MED ORDER — ACETAMINOPHEN 10 MG/ML IV SOLN
1000.0000 mg | Freq: Four times a day (QID) | INTRAVENOUS | Status: AC
  Administered 2013-03-22 – 2013-03-23 (×4): 1000 mg via INTRAVENOUS
  Filled 2013-03-22 (×4): qty 100

## 2013-03-22 MED ORDER — SODIUM CHLORIDE 0.9 % IJ SOLN: 9.0000 mL | INTRAMUSCULAR | Status: DC | PRN

## 2013-03-22 MED ORDER — LACTATED RINGERS IV SOLN
INTRAVENOUS | Status: DC | PRN
  Administered 2013-03-22: 08:00:00 via INTRAVENOUS

## 2013-03-22 MED ORDER — KETOROLAC TROMETHAMINE 30 MG/ML IJ SOLN
INTRAMUSCULAR | Status: AC
  Administered 2013-03-22: 30 mg
  Filled 2013-03-22: qty 1

## 2013-03-22 MED ORDER — BISACODYL 5 MG PO TBEC
10.0000 mg | DELAYED_RELEASE_TABLET | Freq: Every day | ORAL | Status: DC
  Administered 2013-03-23: 10 mg via ORAL
  Filled 2013-03-22: qty 2

## 2013-03-22 MED ORDER — HYDROMORPHONE HCL PF 1 MG/ML IJ SOLN
INTRAMUSCULAR | Status: AC
  Filled 2013-03-22: qty 1

## 2013-03-22 MED ORDER — LIDOCAINE HCL (CARDIAC) 20 MG/ML IV SOLN
INTRAVENOUS | Status: DC | PRN
  Administered 2013-03-22: 50 mg via INTRAVENOUS

## 2013-03-22 MED ORDER — ACETAMINOPHEN 10 MG/ML IV SOLN: 1000.0000 mg | Freq: Once | INTRAVENOUS | Status: DC | PRN

## 2013-03-22 MED ORDER — ALBUTEROL SULFATE (5 MG/ML) 0.5% IN NEBU
INHALATION_SOLUTION | RESPIRATORY_TRACT | Status: AC
  Administered 2013-03-22: 13:00:00
  Filled 2013-03-22: qty 0.5

## 2013-03-22 MED ORDER — MIDAZOLAM HCL 2 MG/2ML IJ SOLN
INTRAMUSCULAR | Status: AC
  Administered 2013-03-22: 0.5 mg
  Filled 2013-03-22: qty 2

## 2013-03-22 MED ORDER — DEXTROSE 5 % IV SOLN
1.5000 g | Freq: Two times a day (BID) | INTRAVENOUS | Status: AC
  Administered 2013-03-22 – 2013-03-23 (×2): 1.5 g via INTRAVENOUS
  Filled 2013-03-22 (×2): qty 1.5

## 2013-03-22 MED ORDER — SUFENTANIL CITRATE 50 MCG/ML IV SOLN
INTRAVENOUS | Status: DC | PRN
Start: 2013-03-22 — End: 2013-03-22
  Administered 2013-03-22 (×3): 5 ug via INTRAVENOUS
  Administered 2013-03-22: 15 ug via INTRAVENOUS

## 2013-03-22 MED ORDER — PANTOPRAZOLE SODIUM 40 MG PO TBEC
40.0000 mg | DELAYED_RELEASE_TABLET | Freq: Two times a day (BID) | ORAL | Status: DC
  Administered 2013-03-22 – 2013-03-23 (×3): 40 mg via ORAL
  Filled 2013-03-22 (×3): qty 1

## 2013-03-22 MED ORDER — HYDROMORPHONE HCL PF 1 MG/ML IJ SOLN
0.2500 mg | INTRAMUSCULAR | Status: DC | PRN
  Administered 2013-03-22 (×4): 0.5 mg via INTRAVENOUS

## 2013-03-22 MED ORDER — ZOLPIDEM TARTRATE 5 MG PO TABS
5.0000 mg | ORAL_TABLET | Freq: Every evening | ORAL | Status: DC | PRN
  Administered 2013-03-22 – 2013-03-23 (×2): 5 mg via ORAL
  Filled 2013-03-22 (×2): qty 1

## 2013-03-22 MED ORDER — OXYCODONE HCL 5 MG PO TABS: 5.0000 mg | ORAL_TABLET | ORAL | Status: AC | PRN

## 2013-03-22 MED ORDER — ONDANSETRON HCL 4 MG/2ML IJ SOLN: 4.0000 mg | Freq: Once | INTRAMUSCULAR | Status: DC | PRN

## 2013-03-22 MED ORDER — DEXTROSE-NACL 5-0.9 % IV SOLN
INTRAVENOUS | Status: DC
  Administered 2013-03-23: 12:00:00 via INTRAVENOUS

## 2013-03-22 MED ORDER — DICLOFENAC SODIUM 1 % TD GEL
2.0000 g | Freq: Every day | TRANSDERMAL | Status: DC
  Administered 2013-03-22 – 2013-03-23 (×2): 2 g via TOPICAL
  Filled 2013-03-22: qty 100

## 2013-03-22 MED ORDER — DIPHENHYDRAMINE HCL 25 MG PO TABS
25.0000 mg | ORAL_TABLET | Freq: Four times a day (QID) | ORAL | Status: DC | PRN
  Filled 2013-03-22: qty 1

## 2013-03-22 MED ORDER — POTASSIUM CHLORIDE 10 MEQ/50ML IV SOLN: 10.0000 meq | Freq: Every day | INTRAVENOUS | Status: DC | PRN

## 2013-03-22 MED ORDER — PROPOFOL 10 MG/ML IV BOLUS
INTRAVENOUS | Status: DC | PRN
  Administered 2013-03-22: 125 mg via INTRAVENOUS

## 2013-03-22 MED ORDER — NALOXONE HCL 0.4 MG/ML IJ SOLN: 0.4000 mg | INTRAMUSCULAR | Status: DC | PRN

## 2013-03-22 MED ORDER — ONDANSETRON HCL 4 MG/2ML IJ SOLN: 4.0000 mg | Freq: Four times a day (QID) | INTRAMUSCULAR | Status: DC | PRN

## 2013-03-22 SURGICAL SUPPLY — 73 items
APPLIER CLIP ROT 10 11.4 M/L (STAPLE)
APR CLP MED LRG 11.4X10 (STAPLE)
BAG SPEC RTRVL LRG 6X4 10 (ENDOMECHANICALS)
CANISTER SUCTION 2500CC (MISCELLANEOUS) ×5 IMPLANT
CATH KIT ON Q 5IN SLV (PAIN MANAGEMENT) IMPLANT
CATH THORACIC 28FR (CATHETERS) IMPLANT
CATH THORACIC 28FR RT ANG (CATHETERS) ×2 IMPLANT
CATH THORACIC 36FR (CATHETERS) IMPLANT
CATH THORACIC 36FR RT ANG (CATHETERS) IMPLANT
CLIP APPLIE ROT 10 11.4 M/L (STAPLE) IMPLANT
CLIP TI MEDIUM 6 (CLIP) ×2 IMPLANT
CLOTH BEACON ORANGE TIMEOUT ST (SAFETY) ×3 IMPLANT
CONN Y 3/8X3/8X3/8  BEN (MISCELLANEOUS)
CONN Y 3/8X3/8X3/8 BEN (MISCELLANEOUS) ×2 IMPLANT
CONT SPEC 4OZ CLIKSEAL STRL BL (MISCELLANEOUS) ×7 IMPLANT
DRAPE LAPAROSCOPIC ABDOMINAL (DRAPES) ×3 IMPLANT
DRAPE WARM FLUID 44X44 (DRAPE) ×4 IMPLANT
ELECT REM PT RETURN 9FT ADLT (ELECTROSURGICAL) ×3
ELECTRODE REM PT RTRN 9FT ADLT (ELECTROSURGICAL) ×2 IMPLANT
GLOVE BIOGEL PI IND STRL 6 (GLOVE) IMPLANT
GLOVE BIOGEL PI IND STRL 7.0 (GLOVE) IMPLANT
GLOVE BIOGEL PI INDICATOR 6 (GLOVE) ×1
GLOVE BIOGEL PI INDICATOR 7.0 (GLOVE) ×4
GLOVE EUDERMIC 7 POWDERFREE (GLOVE) ×6 IMPLANT
GOWN PREVENTION PLUS XLARGE (GOWN DISPOSABLE) ×3 IMPLANT
GOWN STRL NON-REIN LRG LVL3 (GOWN DISPOSABLE) ×7 IMPLANT
HANDLE STAPLE ENDO GIA SHORT (STAPLE) ×1
HEMOSTAT SURGICEL 2X14 (HEMOSTASIS) IMPLANT
KIT BASIN OR (CUSTOM PROCEDURE TRAY) ×3 IMPLANT
KIT ROOM TURNOVER OR (KITS) ×3 IMPLANT
KIT SUCTION CATH 14FR (SUCTIONS) ×3 IMPLANT
NS IRRIG 1000ML POUR BTL (IV SOLUTION) ×6 IMPLANT
PACK CHEST (CUSTOM PROCEDURE TRAY) ×3 IMPLANT
PAD ARMBOARD 7.5X6 YLW CONV (MISCELLANEOUS) ×6 IMPLANT
POUCH ENDO CATCH II 15MM (MISCELLANEOUS) IMPLANT
POUCH SPECIMEN RETRIEVAL 10MM (ENDOMECHANICALS) IMPLANT
RELOAD EGIA 45 MED/THCK PURPLE (STAPLE) ×2 IMPLANT
RELOAD EGIA 60 MED/THCK PURPLE (STAPLE) ×9 IMPLANT
RELOAD STAPLE 60 MED/THCK ART (STAPLE) IMPLANT
SEALANT PROGEL (MISCELLANEOUS) IMPLANT
SEALANT SURG COSEAL 4ML (VASCULAR PRODUCTS) IMPLANT
SEALANT SURG COSEAL 8ML (VASCULAR PRODUCTS) IMPLANT
SOLUTION ANTI FOG 6CC (MISCELLANEOUS) ×6 IMPLANT
SPECIMEN JAR MEDIUM (MISCELLANEOUS) ×3 IMPLANT
SPONGE GAUZE 4X4 12PLY (GAUZE/BANDAGES/DRESSINGS) ×3 IMPLANT
SPONGE INTESTINAL PEANUT (DISPOSABLE) IMPLANT
STAPLER ENDO GIA 12 SHRT THIN (STAPLE) IMPLANT
STAPLER ENDO GIA 12MM SHORT (STAPLE) ×2 IMPLANT
SUT PROLENE 4 0 RB 1 (SUTURE)
SUT PROLENE 4-0 RB1 .5 CRCL 36 (SUTURE) IMPLANT
SUT SILK  1 MH (SUTURE) ×2
SUT SILK 1 MH (SUTURE) ×4 IMPLANT
SUT SILK 2 0SH CR/8 30 (SUTURE) IMPLANT
SUT SILK 3 0SH CR/8 30 (SUTURE) IMPLANT
SUT VIC AB 1 CTX 36 (SUTURE) ×3
SUT VIC AB 1 CTX36XBRD ANBCTR (SUTURE) IMPLANT
SUT VIC AB 2-0 CTX 36 (SUTURE) ×1 IMPLANT
SUT VIC AB 2-0 UR6 27 (SUTURE) ×1 IMPLANT
SUT VIC AB 3-0 MH 27 (SUTURE) IMPLANT
SUT VIC AB 3-0 X1 27 (SUTURE) ×5 IMPLANT
SUT VICRYL 2 TP 1 (SUTURE) IMPLANT
SWAB COLLECTION DEVICE MRSA (MISCELLANEOUS) IMPLANT
SYSTEM SAHARA CHEST DRAIN ATS (WOUND CARE) ×3 IMPLANT
TAPE CLOTH 4X10 WHT NS (GAUZE/BANDAGES/DRESSINGS) ×3 IMPLANT
TAPE CLOTH SURG 4X10 WHT LF (GAUZE/BANDAGES/DRESSINGS) ×1 IMPLANT
TIP APPLICATOR SPRAY EXTEND 16 (VASCULAR PRODUCTS) IMPLANT
TOWEL OR 17X24 6PK STRL BLUE (TOWEL DISPOSABLE) ×3 IMPLANT
TOWEL OR 17X26 10 PK STRL BLUE (TOWEL DISPOSABLE) ×3 IMPLANT
TRAP SPECIMEN MUCOUS 40CC (MISCELLANEOUS) IMPLANT
TRAY FOLEY CATH 14FRSI W/METER (CATHETERS) ×3 IMPLANT
TUBE ANAEROBIC SPECIMEN COL (MISCELLANEOUS) IMPLANT
TUNNELER SHEATH ON-Q 16GX12 DP (PAIN MANAGEMENT) IMPLANT
WATER STERILE IRR 1000ML POUR (IV SOLUTION) ×6 IMPLANT

## 2013-03-22 NOTE — H&P (View-Only) (Signed)
PCP is [name], MD Referring Provider is Brand Males, MD  Chief Complaint  Patient presents with  . Cough    ILD...referred for VATS BX    HPI: 67 year old male presents with chief complaint of cough.  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 About 2 years ago he developed a dry cough. This has been persistent ever since. He tried cough medication which helped a little. But it never completely resolved. Sometimes aggravated by exertion and lying flat. He also has paroxysms of coughing. He denies any shortness of breath except towards the end of his 5 miles on the treadmill that he does daily. He's not had any chest pain. He denies any travel to any  unusual locations. He does not have birds for pets. He does have a history of reflux dating back to 88s. Doubling of his proton pump inhibitor did not improve his cough.  He saw [name] and had a CT of the chest done. It showed interstitial lung disease bilaterally. He now presents for consideration for lung biopsy.  Past Medical History  Diagnosis Date  . Ulcer     unresolved  . GERD (gastroesophageal reflux disease)   . Injury of right hand     permanent damage after workplace 2009 and 2010 Dr Langston [facility]    Past Surgical History  Procedure Laterality Date  . Colonoscopy  02-24-12    adenomatous polyps removed, repeat in 5 yrs, per Dr. Deatra Ina   . Esophagogastroduodenoscopy  2007    Family History  Problem Relation Age of Onset  . Diabetes    . Cancer      prostate  . Liver cancer Mother   . Diabetes Sister   . Lung cancer Sister   . Heart disease Brother   . Diabetes Brother   . Colon cancer Brother     Social History History  Substance Use Topics  . Smoking status: Former Smoker -- 1.00 packs/day for 16 years    Types: Cigarettes    Quit date: 12/14/1978  . Smokeless tobacco: Never Used  . Alcohol Use: No    Current  Outpatient Prescriptions  Medication Sig Dispense Refill  . bacitracin ophthalmic ointment Topically as needed to rt. hand      . chlorpheniramine-HYDROcodone (TUSSIONEX) 10-8 MG/5ML LQCR Take 5 mLs by mouth every 12 (twelve) hours as needed.  140 mL  0  . diclofenac sodium (VOLTAREN) 1 % GEL Apply 1 application topically 4 (four) times daily.  100 g  11  . fluticasone (FLONASE) 50 MCG/ACT nasal spray Place 2 sprays into the nose daily.  16 g  2  . pantoprazole (PROTONIX) 40 MG tablet Take 1 tablet (40 mg total) by mouth 2 (two) times daily.  60 tablet  11  . zolpidem (AMBIEN) 5 MG tablet Take 1 tablet (5 mg total) by mouth at bedtime as needed.  30 tablet  5   No current facility-administered medications for this visit.    No Known Allergies  Review of Systems  Constitutional: Negative for activity change and appetite change.  Respiratory: Positive for cough.   Gastrointestinal:       Reflux and some difficulty swallowing  All other systems reviewed and are negative.    BP 132/82  Pulse 65  Resp 16  Ht 5' 9" (1.753 m)  Wt 185 lb (83.915 kg)  BMI 27.31 kg/m2  SpO2 99% Physical Exam  Vitals reviewed. Constitutional:  He is oriented to person, place, and time. He appears well-developed and well-nourished. No distress.  HENT:  Head: Normocephalic and atraumatic.  Eyes: EOM are normal. Pupils are equal, round, and reactive to light.  Neck: Neck supple. No thyromegaly present.  Cardiovascular: Normal rate, regular rhythm, normal heart sounds and intact distal pulses.  Exam reveals no gallop and no friction rub.   No murmur heard. Pulmonary/Chest: Effort normal. He has no wheezes. He has rales.  Abdominal: Soft. There is no tenderness.  Musculoskeletal: He exhibits no edema.  Lymphadenopathy:    He has no cervical adenopathy.  Neurological: He is alert and oriented to person, place, and time. No cranial nerve deficit.  Skin: Skin is warm and dry.     Diagnostic Tests: CT of  chest 02/14/2013 CT CHEST WITH CONTRAST  Technique: Multidetector CT imaging of the chest was performed  following the standard protocol during bolus administration of  intravenous contrast.  Contrast: 52m OMNIPAQUE IOHEXOL 300 MG/ML SOLN  Comparison: Chest x-ray 02/08/2013.  Findings:  Mediastinum: Heart size is mildly enlarged (with dilatation of the  left ventricle). There is no significant pericardial fluid,  thickening or pericardial calcification. Numerous reactive size  mediastinal and bilateral hilar lymph nodes. No pathologically  enlarged mediastinal or hilar lymph nodes. Esophagus is  unremarkable in appearance.  Lungs/Pleura: Throughout the lungs bilaterally in a strong basal  distribution there are patchy areas of ground-glass attenuation  with some subpleural reticulation and traction bronchiectasis as  well as peripheral bronchiolectasis. At this time, there is no  definite honeycombing confidently identified (the findings in the  lower lobes are attributable entirely to bronchiectasis at this  time). No acute consolidative air space disease. No pleural  effusions. Inspiratory and expiratory imaging is unremarkable.  Upper Abdomen: Unremarkable.  Musculoskeletal: There are no aggressive appearing lytic or blastic  lesions noted in the visualized portions of the skeleton.  IMPRESSION:  1. The appearance of the lungs is compatible with an underlying  interstitial lung disease, as detailed above. Given the  predominance of ground-glass attenuation and lack of frank  honeycombing at this time, the findings are most concerning for  nonspecific interstitial pneumonia (NSIP). However, there is a  very strong craniocaudal gradient such that the possibility of  early usual interstitial pneumonia (UIP) is not entirely excluded.  Attention on a repeat high resolution chest CT in 1 year would be  useful to assess for temporal progression of disease.  2. Borderline enlarged  mediastinal and hilar lymph nodes are  presumably reactive in the setting of interstitial lung disease.  3. Cardiomegaly with left ventricular dilatation.  Original Report Authenticated By: DVinnie Langton M.D.   Impression: 67year old gentleman with interstitial lung disease of uncertain etiology. He needs a lung biopsy to clarify the type of interstitial lung disease and guide treatment. I discussed the general nature of the procedure with the patient and his wife. They understand that it is a diagnostic and therapeutic procedure. We discussed the indications, risks, benefits, and alternatives. They understand that the risks include, but are not limited to, death, MI, DVT, PE, bleeding, possible need for transfusion, infection, cardiac arrhythmias, prolonged air leaks, as well as the possibility of unforeseeable complications. They do understand it is possible that there could be a failure to make a definitive diagnosis. She understands and accepts the risks and wishes to proceed.  Plan: Left VATS, lung biopsy on Wednesday, 03/22/2013.

## 2013-03-22 NOTE — Brief Op Note (Addendum)
03/22/2013  10:30 AM  PATIENT:  Tanner Bishop  67 y.o. male  PRE-OPERATIVE DIAGNOSIS:  interstital lung disease  POST-OPERATIVE DIAGNOSIS:  Interstitial lung disease  PROCEDURE:  Procedure(s): VIDEO ASSISTED THORACOSCOPY LUNG BIOPSY(WEDGE RESECTION X2 LINGULA AND LLL)  SURGEON:  Surgeon(s): Melrose Nakayama, MD  PHYSICIAN ASSISTANT: WAYNE GOLD PA-C  ANESTHESIA:   general  SPECIMEN:  Source of Specimen:  LEFT LINGULA  AND LLL  DISPOSITION OF SPECIMEN:  Pathology  DRAINS: 1 Chest Tube(s) in the LEFT CHEST   PATIENT CONDITION:  PACU - hemodynamically stable.  PRE-OPERATIVE WEIGHT: 85.3kg  FROZEN:  ILD  COMPLICATIONS: NO KNOWN  Frozen + interstitial lung disease

## 2013-03-22 NOTE — Interval H&P Note (Signed)
History and Physical Interval Note:  03/22/2013 8:19 AM  Tanner Bishop  has presented today for surgery, with the diagnosis of interstital lung disease  The various methods of treatment have been discussed with the patient and family. After consideration of risks, benefits and other options for treatment, the patient has consented to  Procedure(s): VIDEO ASSISTED THORACOSCOPY (Left) LUNG BIOPSY (Left) as a surgical intervention .  The patient's history has been reviewed, patient examined, no change in status, stable for surgery.  I have reviewed the patient's chart and labs.  Questions were answered to the patient's satisfaction.     HENDRICKSON,STEVEN C

## 2013-03-22 NOTE — Anesthesia Preprocedure Evaluation (Addendum)
Anesthesia Evaluation  Patient identified by MRN, date of birth, ID band Patient awake    Reviewed: Allergy & Precautions, H&P , NPO status , Patient's Chart, lab work & pertinent test results  Airway Mallampati: II TM Distance: >3 FB Neck ROM: Full    Dental  (+) Teeth Intact, Partial Upper and Dental Advisory Given,    Pulmonary          Cardiovascular     Neuro/Psych    GI/Hepatic GERD-  Medicated and Controlled,  Endo/Other    Renal/GU      Musculoskeletal   Abdominal   Peds  Hematology   Anesthesia Other Findings   Reproductive/Obstetrics                           Anesthesia Physical Anesthesia Plan  ASA: II  Anesthesia Plan: General   Post-op Pain Management:    Induction: Intravenous  Airway Management Planned: Double Lumen EBT  Additional Equipment: Arterial line and CVP  Intra-op Plan:   Post-operative Plan: Extubation in OR  Informed Consent: I have reviewed the patients History and Physical, chart, labs and discussed the procedure including the risks, benefits and alternatives for the proposed anesthesia with the patient or authorized representative who has indicated his/her understanding and acceptance.   Dental advisory given  Plan Discussed with: CRNA, Anesthesiologist and Surgeon  Anesthesia Plan Comments:         Anesthesia Quick Evaluation

## 2013-03-22 NOTE — Anesthesia Postprocedure Evaluation (Signed)
  Anesthesia Post-op Note  Patient: Tanner Bishop  Procedure(s) Performed: Procedure(s): VIDEO ASSISTED THORACOSCOPY (Left) LUNG BIOPSY (Left)  Patient Location: PACU  Anesthesia Type:General  Level of Consciousness: awake, alert  and oriented  Airway and Oxygen Therapy: Patient Spontanous Breathing and Patient connected to nasal cannula oxygen  Post-op Pain: mild  Post-op Assessment: Post-op Vital signs reviewed, Patient's Cardiovascular Status Stable, Respiratory Function Stable and Pain level controlled  Post-op Vital Signs: stable  Complications: No apparent anesthesia complications

## 2013-03-22 NOTE — Preoperative (Signed)
Beta Blockers   Reason not to administer Beta Blockers:Not Applicable 

## 2013-03-22 NOTE — Anesthesia Procedure Notes (Signed)
ProceduresRIJ Dual Lumen CVP: [id number]: The patient was identified and consent obtained.  TO was performed, and full barrier precautions were used.  The skin was anesthetized with lidocaine.  Once the vein was located with the 22 ga. needle using ultrasound guidance , the wire was inserted into the vein.  The wire location was confirmed with ultrasound.  The tissue was dilated and the catheter was carefully inserted, then sutured in place. A dressing was applied. The patient tolerated the procedure well. CE

## 2013-03-22 NOTE — Transfer of Care (Signed)
Immediate Anesthesia Transfer of Care Note  Patient: Tanner Bishop  Procedure(s) Performed: Procedure(s): VIDEO ASSISTED THORACOSCOPY (Left) LUNG BIOPSY (Left)  Patient Location: PACU  Anesthesia Type:General  Level of Consciousness: awake  Airway & Oxygen Therapy: Patient Spontanous Breathing and non-rebreather face mask  Post-op Assessment: Report given to PACU RN, Post -op Vital signs reviewed and stable and Patient moving all extremities  Post vital signs: Reviewed and stable  Complications: No apparent anesthesia complications

## 2013-03-22 NOTE — Progress Notes (Signed)
ngt placed without incident and patient tolerated well. It was placed to decompress the abdomen. kub was done.  Dr. Roxan Hockey dcd the ngt while i was at lunch.

## 2013-03-22 NOTE — Progress Notes (Signed)
Utilization review completed

## 2013-03-23 ENCOUNTER — Inpatient Hospital Stay (HOSPITAL_COMMUNITY): Payer: Medicare Other

## 2013-03-23 ENCOUNTER — Encounter (HOSPITAL_COMMUNITY): Payer: Self-pay | Admitting: Thoracic Surgery (Cardiothoracic Vascular Surgery)

## 2013-03-23 ENCOUNTER — Telehealth: Payer: Self-pay | Admitting: Internal Medicine

## 2013-03-23 LAB — CBC
HCT: 37.7 % — ABNORMAL LOW (ref 39.0–52.0)
Hemoglobin: 12.8 g/dL — ABNORMAL LOW (ref 13.0–17.0)
MCH: 31.8 pg (ref 26.0–34.0)
MCHC: 34 g/dL (ref 30.0–36.0)
MCV: 93.5 fL (ref 78.0–100.0)
Platelets: 209 10*3/uL (ref 150–400)
RBC: 4.03 MIL/uL — ABNORMAL LOW (ref 4.22–5.81)
RDW: 13 % (ref 11.5–15.5)
WBC: 10.2 10*3/uL (ref 4.0–10.5)

## 2013-03-23 LAB — BASIC METABOLIC PANEL
BUN: 12 mg/dL (ref 6–23)
CO2: 27 mEq/L (ref 19–32)
Calcium: 8.3 mg/dL — ABNORMAL LOW (ref 8.4–10.5)
Chloride: 103 mEq/L (ref 96–112)
Creatinine, Ser: 0.9 mg/dL (ref 0.50–1.35)
GFR calc Af Amer: >90 mL/min (ref >90)
GFR calc non Af Amer: 87 mL/min — ABNORMAL LOW (ref >90)
Glucose, Bld: 119 mg/dL — ABNORMAL HIGH (ref 70–99)
Potassium: 3.6 mEq/L (ref 3.5–5.1)
Sodium: 135 mEq/L (ref 135–145)

## 2013-03-23 LAB — BLOOD GAS, ARTERIAL
Acid-Base Excess: 2.2 mmol/L — ABNORMAL HIGH (ref 0.0–2.0)
Bicarbonate: 27.1 mEq/L — ABNORMAL HIGH (ref 20.0–24.0)
Drawn by: 283401
O2 Content: 3 L/min
O2 Saturation: 99.5 %
Patient temperature: 98.6
TCO2: 28.5 mmol/L (ref 0–100)
pCO2 arterial: 48.2 mmHg — ABNORMAL HIGH (ref 35.0–45.0)
pH, Arterial: 7.368 (ref 7.350–7.450)
pO2, Arterial: 110 mmHg — ABNORMAL HIGH (ref 80.0–100.0)

## 2013-03-23 MED ORDER — LEVALBUTEROL HCL 0.63 MG/3ML IN NEBU
0.6300 mg | INHALATION_SOLUTION | Freq: Three times a day (TID) | RESPIRATORY_TRACT | Status: DC
  Administered 2013-03-23 (×2): 0.63 mg via RESPIRATORY_TRACT
  Filled 2013-03-23 (×6): qty 3

## 2013-03-23 MED ORDER — PANTOPRAZOLE SODIUM 40 MG PO TBEC: 40.0000 mg | DELAYED_RELEASE_TABLET | Freq: Two times a day (BID) | ORAL | Status: DC

## 2013-03-23 NOTE — Telephone Encounter (Signed)
Had VATS lung bx yesterday. Doing well today. Pls give fu appt to dsicuss bx results; first avail < 3 weeks - 4 weeks.  Thanks  Dr. Brand Males, M.D., [facility].C.P Pulmonary and Critical Care Medicine Staff Physician [facility] Pager: [phone], If no answer or between  15:00h - 7:00h: call [phone]  03/23/2013 1:39 PM

## 2013-03-23 NOTE — Op Note (Signed)
NAME:  JUNE, VACHA NO.:  [id number]  MEDICAL RECORD NO.:  [id number]  LOCATION:  3306                         FACILITY:  [facility]  PHYSICIAN:  Revonda Standard. Roxan Hockey, M.D.DATE OF BIRTH:  1946-08-23  DATE OF PROCEDURE:  03/22/2013 DATE OF DISCHARGE:                              OPERATIVE REPORT   PREOPERATIVE DIAGNOSIS:  Interstitial lung disease.  POSTOPERATIVE DIAGNOSIS:  Interstitial lung disease.  PROCEDURE:  Left video-assisted thoracoscopy and lung biopsy x2 (lingula and left lower lobe.)  SURGEON:  Remo Lipps C. Roxan Hockey, M.D.  ASSISTANT:  Margues Giovanni, P.A.-C.  ANESTHESIA:  General.  FINDINGS:  Adhesions of lower lobe to diaphragm and lateral chest wall. Lingula and lower lobe areas that were biopsied were relatively grayish and faintly nodular.  There were no discrete masses.  Frozen section revealed interstitial lung disease, will need to be sent out for final pathologic diagnosis.  CLINICAL NOTE:  Tanner Bishop is a 67 year old gentleman with a chronic cough.  Workup has revealed interstitial lung disease, and he now presents for a lung biopsy for diagnostic purposes.  The indications, risks, benefits, and alternatives were discussed in detail with the patient.  He understood and accepted the risks of surgery and understood the surgery was strictly diagnostic, and wished to proceed.  OPERATIVE NOTE:  Tanner Bishop was brought to the preoperative holding area on March 22, 2013.  There Anesthesia placed a central line and arterial blood pressure monitoring line.  He was taken to the operating room, anesthetized, and intubated.  Intravenous antibiotics were administered.  PAS hose were placed for DVT prophylaxis.  A Foley catheter was placed.  He was placed in a right lateral decubitus position and the left chest was prepped and draped in usual sterile fashion.  Single lung ventilation of the right lung was carried out and was tolerated well  throughout the procedure.  A small port incision was made in the 8th intercostal space in the midaxillary line and was carried through the skin and subcutaneous tissue.  The chest was entered bluntly using a hemostat.  Port was inserted.  Thoracoscope was placed in the chest.  There was no cross ventilation of the left lung.  The entire lung had a slightly grayish appearance particularly more notable at the bases.  There were some adhesions of the lower lobe to the diaphragm as well as the lateral chest wall.  A small utility incision was made approximately 4 cm in length.  It was carried through the skin and subcutaneous tissue.  The serratus muscles were separated.  The intercostal muscles were divided.  The lingula was grasped, this was one the more prominent areas of disease on the CT scan, and biopsy was performed with sequential firings of an Endo-GIA stapler.  Purple cartridges were used for all staple lines.  The specimen was removed and sent for frozen section.  There was good hemostasis at the staple line. Next, the left lower lobe was inspected.  There were dense adhesions posteriorly and laterally and no attempt was made to take these down. Biopsy was obtained of the lower anterior portion of the lower lobe with sequential firings of an Endo-GIA stapler.  The  specimen was removed and sent for permanent pathology.  There was good hemostasis of the staple line.  The frozen section subsequently returned with interstitial lung disease.  There were some atypical cells noted as well.  Final diagnosis will depend on permanent pathology.  Final inspection was made of the staple line.  There was again good hemostasis.  A 28-French chest tube was placed through the original port incision and secured with a #1 silk suture.  The left lung was reinflated.  The scope was removed.  The small utility incision was closed with #1 Vicryl fascial closure followed by a 2-0 Vicryl subcutaneous  tissue closure and 3-0 Vicryl subcuticular skin closure.  All sponge, needle, and instrument counts were correct at the end of the procedure.  The patient was extubated in the operating room and taken to the postanesthetic care unit in good condition.     Revonda Standard Roxan Hockey, M.D.     SCH/MEDQ  D:  03/22/2013  T:  03/23/2013  Job:  [id number]

## 2013-03-23 NOTE — Progress Notes (Addendum)
TCTS DAILY PROGRESS NOTE                   [address]            [place] [address]          [phone]      1 Day Post-Op Procedure(s) (LRB): VIDEO ASSISTED THORACOSCOPY (Left) LUNG BIOPSY (Left)  Total Length of Stay:  LOS: 1 day   Subjective: Feels pretty well, minimal pain, not SOB  Objective: Vital signs in last 24 hours: Temp:  [97.6 F (36.4 C)-98.8 F (37.1 C)] 98.8 F (37.1 C) (04/10 0400) Pulse Rate:  [64-91] 70 (04/10 0400) Cardiac Rhythm:  [-] Normal sinus rhythm (04/10 0400) Resp:  [14-25] 19 (04/10 0719) BP: (99-136)/(55-66) 102/56 mmHg (04/10 0400) SpO2:  [90 %-100 %] 98 % (04/10 0719) Arterial Line BP: (82-162)/(53-71) 108/71 mmHg (04/10 0400) Weight:  [197 lb 15.6 oz (89.8 kg)] 197 lb 15.6 oz (89.8 kg) (04/09 1436)  Filed Weights   03/22/13 1436  Weight: 197 lb 15.6 oz (89.8 kg)    Weight change:    Hemodynamic parameters for last 24 hours:    Intake/Output from previous day: 04/09 0701 - 04/10 0700 In: 2840 [P.O.:240; I.V.:2600] Out: 995 [Urine:775; Blood:50; Chest Tube:170]  Intake/Output this shift:    Current Meds: Scheduled Meds: . acetaminophen  1,000 mg Intravenous Q6H  . bisacodyl  10 mg Oral Daily  . cefUROXime (ZINACEF)  IV  1.5 g Intravenous Q12H  . diclofenac sodium  2 g Topical QHS  . fentaNYL   Intravenous Q4H  . ketorolac  30 mg Intravenous Q6H  . pantoprazole  40 mg Oral BID   Continuous Infusions: . dextrose 5 % and 0.9% NaCl 100 mL/hr at 03/23/13 0600   PRN Meds:.diphenhydrAMINE, naloxone, ondansetron (ZOFRAN) IV, oxyCODONE, oxyCODONE-acetaminophen, potassium chloride, sodium chloride, traMADol, zolpidem  General appearance: alert, cooperative and no distress Heart: regular rate and rhythm Lungs: + wheeze mostly in lower fields Abdomen: benign Extremities: no  edema Wound: dressings ok  Lab Results: CBC: Recent Labs  03/20/13 1324 03/23/13 0400  WBC 7.9 10.2  HGB 14.9 12.8*  HCT 43.2  37.7*  PLT 203 209   BMET:  Recent Labs  03/20/13 1324 03/23/13 0400  NA 138 135  K 4.8 3.6  CL 104 103  CO2 26 27  GLUCOSE 102* 119*  BUN 14 12  CREATININE 0.97 0.90  CALCIUM 9.3 8.3*    PT/INR:  Recent Labs  03/20/13 1324  LABPROT 12.7  INR 0.96   Radiology: Dg Chest Portable 1 View  03/22/2013  *RADIOLOGY REPORT*  Clinical Data: Follow-up video assisted thoracoscopy.  PORTABLE CHEST - 1 VIEW  Comparison: Same day  Findings: Left chest tube remains in place.  Nasogastric tube has been placed, entering the stomach.  Right internal jugular central line is in place with its tip in the SVC above the right atrium. There is a tiny amount of pleural air at the apex on the left. Chest wall air remains evident.  There is mild basilar volume loss.  IMPRESSION: No complications seen postoperatively.  Tiny amount of pleural air on the left at the apex.  Mild basilar volume loss.  Nasogastric tube tip enters the stomach.   Original Report Authenticated By: Nelson Chimes, M.D.    Dg Chest Portable 1 View  03/22/2013  *RADIOLOGY REPORT*  Clinical Data: Postop, shortness of breath  PORTABLE CHEST - 1 VIEW  Comparison: 03/20/2013  Findings:  Small left apical pneumothorax.  Indwelling left apical chest tube. Associated subcutaneous emphysema along the left lateral chest wall.  Low lung volumes.  Chronic interstitial markings.  Left basilar opacity, possibly atelectasis.  The heart is top normal in size.  Right IJ venous catheter with its tip at the cavoatrial junction.  IMPRESSION: Small left apical pneumothorax.  Indwelling left chest tube.   Original Report Authenticated By: Julian Hy, M.D.    Dg Abd Portable 1v  03/22/2013  *RADIOLOGY REPORT*  Clinical Data: 67 year old male NG tube placement.  Postop.  PORTABLE ABDOMEN - 1 VIEW  Comparison: Portable chest radiograph from the same day.  Findings: Portable AP supine view 1338 hours.  NG tube tip in the midline epigastric region. Visualized bowel  gas pattern is nonobstructed.  Pelvic phleboliths. No acute osseous abnormality identified.  IMPRESSION: NG tube tip projects in the midline at the level of the gastric body.   Original Report Authenticated By: Roselyn Reef, M.D.    Chest tube-  no air leak, 170 cc  Assessment/Plan: S/P Procedure(s) (LRB): VIDEO ASSISTED THORACOSCOPY (Left) LUNG BIOPSY (Left)  1 doing well 2 pulm toilet/xopenex nebs 3 chest tube to H2O seal 4 d/c foley, a line 5 decrease ivf to 50cc/hr 6 cont pca for now    GOLD,WAYNE E 03/23/2013 7:31 AM  patietn seen and examined. Agree with above No air leak on water seal- will dc CT

## 2013-03-24 ENCOUNTER — Inpatient Hospital Stay (HOSPITAL_COMMUNITY): Payer: Medicare Other

## 2013-03-24 LAB — CBC
HCT: 36.3 % — ABNORMAL LOW (ref 39.0–52.0)
Hemoglobin: 12.7 g/dL — ABNORMAL LOW (ref 13.0–17.0)
MCH: 32.4 pg (ref 26.0–34.0)
MCHC: 35 g/dL (ref 30.0–36.0)
MCV: 92.6 fL (ref 78.0–100.0)
Platelets: 196 10*3/uL (ref 150–400)
RBC: 3.92 MIL/uL — ABNORMAL LOW (ref 4.22–5.81)
RDW: 13 % (ref 11.5–15.5)
WBC: 9.3 10*3/uL (ref 4.0–10.5)

## 2013-03-24 LAB — COMPREHENSIVE METABOLIC PANEL
ALT: 9 U/L (ref 0–53)
AST: 22 U/L (ref 0–37)
Albumin: 2.9 g/dL — ABNORMAL LOW (ref 3.5–5.2)
Alkaline Phosphatase: 47 U/L (ref 39–117)
BUN: 10 mg/dL (ref 6–23)
CO2: 30 mEq/L (ref 19–32)
Calcium: 8.4 mg/dL (ref 8.4–10.5)
Chloride: 105 mEq/L (ref 96–112)
Creatinine, Ser: 0.96 mg/dL (ref 0.50–1.35)
GFR calc Af Amer: >90 mL/min (ref >90)
GFR calc non Af Amer: 85 mL/min — ABNORMAL LOW (ref >90)
Glucose, Bld: 108 mg/dL — ABNORMAL HIGH (ref 70–99)
Potassium: 3.9 mEq/L (ref 3.5–5.1)
Sodium: 139 mEq/L (ref 135–145)
Total Bilirubin: 0.4 mg/dL (ref 0.3–1.2)
Total Protein: 6.1 g/dL (ref 6.0–8.3)

## 2013-03-24 MED ORDER — OXYCODONE-ACETAMINOPHEN 5-325 MG PO TABS: 1.0000 | ORAL_TABLET | ORAL | Status: DC | PRN

## 2013-03-24 NOTE — Discharge Summary (Signed)
[facility] [facility]       [place] [address]             [phone]     Tanner Bishop 24-Sep-1946 67 y.o. [id number]  03/22/2013   Tanner Nakayama, MD  interstital lung disease  HPI: At time of consultation 67 year old male presents with chief complaint of cough.  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 About 2 years ago he developed a dry cough. This has been persistent ever since. He tried cough medication which helped a little. But it never completely resolved. Sometimes aggravated by exertion and lying flat. He also has paroxysms of coughing. He denies any shortness of breath except towards the end of his 5 miles on the treadmill that he does daily. He's not had any chest pain. He denies any travel to any unusual locations. He does not have birds for pets. He does have a history of reflux dating back to 11s. Doubling of his proton pump inhibitor did not improve his cough.  He saw Tanner Bishop and had a CT of the chest done. It showed interstitial lung disease bilaterally. He now presents for consideration for lung biopsy. Tanner Bishop evaluated the patient and his studies and he was admitted for the procedure.  Past Medical History   Diagnosis  Date   .  Ulcer      unresolved   .  GERD (gastroesophageal reflux disease)    .  Injury of right hand      permanent damage after workplace 2009 and 2010 Dr [facility]    Past Surgical History   Procedure  Laterality  Date   .  Colonoscopy   02-24-12     adenomatous polyps removed, repeat in 5 yrs, per Dr. Deatra Ina   .  Esophagogastroduodenoscopy   2007    Family History   Problem  Relation  Age of Onset   .  Diabetes     .  Cancer       prostate   .  Liver cancer  Mother    .  Diabetes  Sister    .  Lung cancer  Sister    .  Heart disease  Brother    .  Diabetes  Brother    .  Colon cancer  Brother     Social History  History     Substance Use Topics   .  Smoking status:  Former Smoker -- 1.00 packs/day for 16 years     Types:  Cigarettes     Quit date:  12/14/1978   .  Smokeless tobacco:  Never Used   .  Alcohol Use:  No    Current Outpatient Prescriptions   Medication  Sig  Dispense  Refill   .  bacitracin ophthalmic ointment  Topically as needed to rt. hand     .  chlorpheniramine-HYDROcodone (TUSSIONEX) 10-8 MG/5ML LQCR  Take 5 mLs by mouth every 12 (twelve) hours as needed.  140 mL  0   .  diclofenac sodium (VOLTAREN) 1 % GEL  Apply 1 application topically 4 (four) times daily.  100 g  11   .  fluticasone (FLONASE) 50 MCG/ACT nasal spray  Place 2 sprays into the nose daily.  16 g  2   .  pantoprazole (PROTONIX) 40 MG tablet  Take 1 tablet (40 mg total) by mouth 2 (two) times daily.  60 tablet  11   .  zolpidem (AMBIEN) 5 MG tablet  Take 1 tablet (5 mg total) by mouth at bedtime as needed.  30 tablet  5    No current facility-administered medications for this visit.    No Known Allergies  Hospital Course:  The patient was admitted to the hospital and taken to the operating room on 03/22/2013 and underwent Procedure(s): DATE OF PROCEDURE: 03/22/2013  DATE OF DISCHARGE:  OPERATIVE REPORT  PREOPERATIVE DIAGNOSIS: Interstitial lung disease.  POSTOPERATIVE DIAGNOSIS: Interstitial lung disease.  PROCEDURE: Left video-assisted thoracoscopy and lung biopsy x2 (lingula  and left lower lobe.)  SURGEON: Remo Lipps C. Roxan Bishop, M.D.  ASSISTANT: Tanner Bishop, P.A.-C.  ANESTHESIA: General.  FINDINGS: Adhesions of lower lobe to diaphragm and lateral chest wall.  Lingula and lower lobe areas that were biopsied were relatively grayish  and faintly nodular. There were no discrete masses. Frozen section  revealed interstitial lung disease, will need to be sent out for final  pathologic diagnosis.  Postoperative hospital course:  Patient progressed nicely. All routine lines, monitors and drainage devices were  discontinued in the standard fashion. He was weaned from oxygen. Incisions were healing without evidence of infection. He is tolerating gradually increasing activities using standard protocols. Laboratory values were stable. Pathology is currently pending and was sent to an outside consultation laboratory . He was felt to be stable for discharge.    Recent Labs  03/23/13 0400 03/24/13 0430  NA 135 139  K 3.6 3.9  CL 103 105  CO2 27 30  GLUCOSE 119* 108*  BUN 12 10  CALCIUM 8.3* 8.4    Recent Labs  03/23/13 0400 03/24/13 0430  WBC 10.2 9.3  HGB 12.8* 12.7*  HCT 37.7* 36.3*  PLT 209 196   No results found for this basename: INR,  in the last 72 hours   Discharge Instructions:  The patient is discharged to home with extensive instructions on wound care and progressive ambulation.  They are instructed not to drive or perform any heavy lifting until returning to see the physician in his office.  Discharge Diagnosis:  interstital lung disease  Secondary Diagnosis: Patient Active Problem List  Diagnosis  . GERD  . CRUSHING INJURY OF HAND  . BURN, THIRD DEGREE, HAND  . ILD (interstitial lung disease)  . Chronic cough  . Dysphagia, unspecified   Past Medical History  Diagnosis Date  . Ulcer     unresolved  . GERD (gastroesophageal reflux disease)   . Injury of right hand     permanent damage after workplace 2009 and 2010 Dr Tanner Bishop [facility]  . Arthritis        Medication List    TAKE these medications       diclofenac sodium 1 % Gel  Commonly known as:  VOLTAREN  Apply 1 g topically at bedtime. To hands     diphenhydrAMINE 25 MG tablet  Commonly known as:  BENADRYL  Take 25 mg by mouth every 6 (six) hours as needed for allergies or sleep.     oxyCODONE-acetaminophen 5-325 MG per tablet  Commonly known as:  PERCOCET/ROXICET  Take 1-2 tablets by mouth every 4 (four) hours as needed.     pantoprazole 40 MG tablet  Commonly known as:  PROTONIX   Take 1 tablet (40 mg total) by mouth 2 (two) times daily.     zolpidem 5 MG tablet  Commonly known as:  AMBIEN  Take 1 tablet (5 mg total) by mouth at bedtime as needed.  Follow-up Information   Follow up with Tanner Nakayama, MD. (office will contact you about appointment and suture removal)    Contact information:   [address] [place] [address] [phone]       Follow up with [facility], MD. (call for appt 3 weeks)    Contact information:   [place] [address] [phone]      Disposition: Discharge home  Patient's condition is [place], PA-C 03/24/2013  7:42 AM

## 2013-03-24 NOTE — Progress Notes (Addendum)
[facility] DAILY PROGRESS NOTE                   [address]            [place] [address]          [phone]      2 Days Post-Op Procedure(s) (LRB): VIDEO ASSISTED THORACOSCOPY (Left) LUNG BIOPSY (Left)  Total Length of Stay:  LOS: 2 days   Subjective: Looks and feels well  Objective: Vital signs in last 24 hours: Temp:  [97.3 F (36.3 C)-99.4 F (37.4 C)] 99.4 F (37.4 C) (04/11 0400) Pulse Rate:  [67-72] 72 (04/10 1956) Cardiac Rhythm:  [-] Normal sinus rhythm (04/11 0359) Resp:  [17-25] 17 (04/11 0400) BP: (113-137)/(54-82) 137/82 mmHg (04/11 0359) SpO2:  [94 %-98 %] 95 % (04/11 0400) Arterial Line BP: (97)/(70) 97/70 mmHg (04/10 0725)  Filed Weights   03/22/13 1436  Weight: 197 lb 15.6 oz (89.8 kg)    Weight change:    Hemodynamic parameters for last 24 hours:    Intake/Output from previous day: 04/10 0701 - 04/11 0700 In: 2339.7 [P.O.:840; I.V.:1499.7] Out: 840 [Urine:800; Chest Tube:40]  Intake/Output this shift:    Current Meds: Scheduled Meds: . bisacodyl  10 mg Oral Daily  . diclofenac sodium  2 g Topical QHS  . fentaNYL   Intravenous Q4H  . ketorolac  30 mg Intravenous Q6H  . levalbuterol  0.63 mg Nebulization TID  . pantoprazole  40 mg Oral BID   Continuous Infusions: . dextrose 5 % and 0.9% NaCl 50 mL/hr at 03/24/13 0600   PRN Meds:.diphenhydrAMINE, naloxone, ondansetron (ZOFRAN) IV, oxyCODONE-acetaminophen, potassium chloride, sodium chloride, traMADol, zolpidem  General appearance: alert, cooperative and no distress Heart: regular rate and rhythm Lungs: crackles L>R bases Abdomen: benign Extremities: no edema Wound: healing well  Lab Results: CBC: Recent Labs  03/23/13 0400 03/24/13 0430  WBC 10.2 9.3  HGB 12.8* 12.7*  HCT 37.7* 36.3*  PLT 209 196   BMET:  Recent Labs  03/23/13 0400 03/24/13 0430  NA 135 139  K 3.6 3.9  CL 103 105  CO2 27 30  GLUCOSE 119* 108*  BUN 12 10  CREATININE 0.90 0.96    CALCIUM 8.3* 8.4    PT/INR: No results found for this basename: LABPROT, INR,  in the last 72 hours Radiology: Dg Chest Port 1 View  03/23/2013  *RADIOLOGY REPORT*  Clinical Data: Chest tube removal.  PORTABLE CHEST - 1 VIEW  Comparison: Plain film of the chest 03/23/2013 and 01/20/2019 7:00 a.m.  Findings: Left chest tube has been removed.  There is a small left pneumothorax, 5% or less, which is unchanged.  Left effusion basilar airspace disease are also unchanged.  Right lung is clear. Heart size appears enlarged.  IMPRESSION: Status post left chest tube removal with a small left pneumothorax, 5% less, which does not appear notably changed.   Original Report Authenticated By: Orlean Patten, M.D.    Dg Chest Port 1 View  03/23/2013  *RADIOLOGY REPORT*  Clinical Data: Chest tube, follow-up  PORTABLE CHEST - 1 VIEW  Comparison: Portable chest x-ray of 03/22/2013  Findings: A small left apical pneumothorax remains with left chest tube present.  The lungs are not as well aerated and there may be mild pulmonary vascular congestion present with basilar atelectasis as well.  The right IJ central venous line tip overlies the right atrium and NG tube has been removed. Mild cardiomegaly is stable.  IMPRESSION:  1.  No change in small left apical pneumothorax with left chest tube remaining. 2.  Diminished aeration with increased basilar atelectasis. 3.  Probable mild pulmonary vascular congestion.   Original Report Authenticated By: Ivar Drape, M.D.    Dg Chest Portable 1 View  03/22/2013  *RADIOLOGY REPORT*  Clinical Data: Follow-up video assisted thoracoscopy.  PORTABLE CHEST - 1 VIEW  Comparison: Same day  Findings: Left chest tube remains in place.  Nasogastric tube has been placed, entering the stomach.  Right internal jugular central line is in place with its tip in the SVC above the right atrium. There is a tiny amount of pleural air at the apex on the left. Chest wall air remains evident.  There is mild  basilar volume loss.  IMPRESSION: No complications seen postoperatively.  Tiny amount of pleural air on the left at the apex.  Mild basilar volume loss.  Nasogastric tube tip enters the stomach.   Original Report Authenticated By: Nelson Chimes, M.D.    Dg Chest Portable 1 View  03/22/2013  *RADIOLOGY REPORT*  Clinical Data: Postop, shortness of breath  PORTABLE CHEST - 1 VIEW  Comparison: 03/20/2013  Findings: Small left apical pneumothorax.  Indwelling left apical chest tube. Associated subcutaneous emphysema along the left lateral chest wall.  Low lung volumes.  Chronic interstitial markings.  Left basilar opacity, possibly atelectasis.  The heart is top normal in size.  Right IJ venous catheter with its tip at the cavoatrial junction.  IMPRESSION: Small left apical pneumothorax.  Indwelling left chest tube.   Original Report Authenticated By: Julian Hy, M.D.    Dg Abd Portable 1v  03/22/2013  *RADIOLOGY REPORT*  Clinical Data: 67 year old male NG tube placement.  Postop.  PORTABLE ABDOMEN - 1 VIEW  Comparison: Portable chest radiograph from the same day.  Findings: Portable AP supine view 1338 hours.  NG tube tip in the midline epigastric region. Visualized bowel gas pattern is nonobstructed.  Pelvic phleboliths. No acute osseous abnormality identified.  IMPRESSION: NG tube tip projects in the midline at the level of the gastric body.   Original Report Authenticated By: Roselyn Reef, M.D.      Assessment/Plan: S/P Procedure(s) (LRB): VIDEO ASSISTED THORACOSCOPY (Left) LUNG BIOPSY (Left) Plan for discharge: see discharge orders     GOLD,WAYNE E 03/24/2013 7:24 AM  Patient seen and examined. Agree with above Path still pending

## 2013-03-24 NOTE — Progress Notes (Signed)
Discharge instructions explained and discussed with patient and wife. Pt discharge home with wife and belongings via w/c. Prescriptions, f/u appointment, exit care note on after care for thorascopy given. No complains voiced at this time. Suezanne Cheshire

## 2013-03-29 NOTE — Telephone Encounter (Signed)
ATCx2, line rang several times, NA no voicemail. WCB.  Bing, CMA

## 2013-04-03 ENCOUNTER — Encounter (INDEPENDENT_AMBULATORY_CARE_PROVIDER_SITE_OTHER): Payer: Medicare Other

## 2013-04-03 DIAGNOSIS — D381 Neoplasm of uncertain behavior of trachea, bronchus and lung: Secondary | ICD-10-CM

## 2013-04-04 NOTE — Telephone Encounter (Signed)
[id number]. [place] Bing, CMA

## 2013-04-04 NOTE — Telephone Encounter (Signed)
appt set for 04-18-13. [place] Bing, CMA

## 2013-04-11 ENCOUNTER — Ambulatory Visit: Payer: Medicare Other | Admitting: Thoracic Surgery (Cardiothoracic Vascular Surgery)

## 2013-04-14 ENCOUNTER — Ambulatory Visit
Admission: RE | Admit: 2013-04-14 | Discharge: 2013-04-14 | Disposition: A | Discharge status: Home / Self Care | Payer: Medicare Other | Source: Ambulatory Visit | Attending: Thoracic Surgery (Cardiothoracic Vascular Surgery) | Admitting: Thoracic Surgery (Cardiothoracic Vascular Surgery)

## 2013-04-14 ENCOUNTER — Encounter: Payer: Self-pay | Admitting: Thoracic Surgery (Cardiothoracic Vascular Surgery)

## 2013-04-14 ENCOUNTER — Ambulatory Visit (INDEPENDENT_AMBULATORY_CARE_PROVIDER_SITE_OTHER): Payer: Self-pay | Admitting: Thoracic Surgery (Cardiothoracic Vascular Surgery)

## 2013-04-14 ENCOUNTER — Telehealth: Payer: Self-pay | Admitting: Internal Medicine

## 2013-04-14 VITALS — BP 132/85 | HR 77 | Resp 20 | Ht 68.0 in | Wt 197.0 lb

## 2013-04-14 DIAGNOSIS — Z9889 Other specified postprocedural states: Secondary | ICD-10-CM

## 2013-04-14 DIAGNOSIS — J849 Interstitial pulmonary disease, unspecified: Secondary | ICD-10-CM

## 2013-04-14 DIAGNOSIS — J984 Other disorders of lung: Secondary | ICD-10-CM

## 2013-04-14 DIAGNOSIS — J8410 Pulmonary fibrosis, unspecified: Secondary | ICD-10-CM

## 2013-04-14 NOTE — Telephone Encounter (Signed)
xxx

## 2013-04-14 NOTE — Progress Notes (Signed)
  HPI:  [name] returns for a scheduled postoperative followup visit. He had a left lung biopsy for interstitial disease on April 9. He did well postoperatively. Since discharge she's been doing well. He does have some incisional pain but is not taking any narcotics. He is back to walking 5 miles a day. He has started driving without any problems. He wants to know if he can begin lifting.pm.  Past Medical History  Diagnosis Date  . Ulcer     unresolved  . GERD (gastroesophageal reflux disease)   . Injury of right hand     permanent damage after workplace 2009 and 2010 Dr Vernona Rieger [facility]  . Arthritis       Current Outpatient Prescriptions  Medication Sig Dispense Refill  . diclofenac sodium (VOLTAREN) 1 % GEL Apply 1 g topically at bedtime. To hands      . diphenhydrAMINE (BENADRYL) 25 MG tablet Take 25 mg by mouth every 6 (six) hours as needed for allergies or sleep.      . pantoprazole (PROTONIX) 40 MG tablet Take 1 tablet (40 mg total) by mouth 2 (two) times daily.  60 tablet  11  . zolpidem (AMBIEN) 5 MG tablet Take 1 tablet (5 mg total) by mouth at bedtime as needed.  30 tablet  5   No current facility-administered medications for this visit.    Physical Exam BP 132/85  Pulse 77  Resp 20  Ht _0  (1.727 m)  Wt 197 lb (89.359 kg)  BMI 29.96 kg/m2  SpO72 80% 67 year old male in no acute distress Incisions well healed Lungs with basilar crackles  Diagnostic Tests: Chest x-ray 04/14/13 CHEST - 2 VIEW  Comparison: Portable chest x-rays 03/24/2013 dating back to  03/22/2013. Preoperative two-view chest x-ray 03/20/2013.  Findings: Cardiac silhouette mildly enlarged but stable. Hilar and  mediastinal contours otherwise unremarkable and unchanged.  Interval resolution of the left pneumothorax since 04/11.  Atelectasis at the left lung base. Stable chronic mild  interstitial lung disease. Lungs otherwise clear. No visible  pleural effusions. Degenerative  changes involving the thoracic  spine.  IMPRESSION:  Mild atelectasis in the left lower lobe. No acute cardiopulmonary  disease otherwise. Interval resolution of the left pneumothorax  since 03/24/2013.  Pathology  Usual interstitial pneumonia   Impression: 67 year old gentleman status post lung biopsy for interstitial lung disease. He is doing very well at this point in time. He does have some incisional discomfort, but is not requiring any narcotics. He has started driving and has had no issues related to that. At this point he can resume full activities but did advise him to build into new activities gradually.   Plan:  He has an appointment with [name] next week to discuss treatment of UIP  I will be happy to see him back any time if I can be of any further assistance with his care.

## 2013-04-18 ENCOUNTER — Ambulatory Visit (INDEPENDENT_AMBULATORY_CARE_PROVIDER_SITE_OTHER): Payer: Medicare Other | Admitting: Internal Medicine

## 2013-04-18 ENCOUNTER — Encounter: Payer: Self-pay | Admitting: Internal Medicine

## 2013-04-18 VITALS — BP 128/78 | HR 78 | Temp 98.0°F | Ht 69.0 in | Wt 185.6 lb

## 2013-04-18 DIAGNOSIS — R059 Cough, unspecified: Secondary | ICD-10-CM

## 2013-04-18 DIAGNOSIS — R053 Chronic cough: Secondary | ICD-10-CM

## 2013-04-18 DIAGNOSIS — J84112 Idiopathic pulmonary fibrosis: Secondary | ICD-10-CM

## 2013-04-18 DIAGNOSIS — R05 Cough: Secondary | ICD-10-CM

## 2013-04-18 MED ORDER — MORPHINE SULFATE 10 MG/5ML PO SOLN: 4.0000 mg | Freq: Two times a day (BID) | ORAL | Status: DC | PRN

## 2013-04-18 NOTE — Progress Notes (Signed)
Subjective:    Patient ID: Tanner Bishop, male    DOB: 1946/08/06, 67 y.o.   MRN: [id number]  HPI IOV 02/16/2013 c/o nagging cough. Insidious onset x 2 years. Progressive past year. Cough rated as severe at time. Triggered by aerosol sprays and lying flat. Cough improved nothing. Cough not improved with GERD Rx. Cough is associated with clearing but no tickle. He feels is deeper in lungs  Last year feb 2013 apparently cough was present but lung exam was clear and ppi doubled but cough did not clear. This year annual physical stated cough not better (feb 2014). Exam revealed crackles for first time per hx. This resuled in imaging and therefore here (imaging showed ILD)  #Sinus  - denies sinus issues or pnd  #allergies   - denies allergies  #GI  - has GERD x several years since 65s. On ppi that keeps it under control. 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 Started at age 86. 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, "different finishing chemicals" - Denies asbestos exposure, gold plate work, berrylium, oil and gas, titanium exposure - Denies mold exposure at home, denies using humidifier use - Denis birds as pet - Denies amiodarone use, or nitrafurantoin, chemotherapy or radiation   #Systemic disease  - denies RA, SLE,collagen vascular disease   Dr Lorenza Cambridge Reflux Symptom Index (> 13-15 suggestive of LPR cough) 0 -> 5  =  none ->severe problem  Hoarseness of problem with voice 0  Clearing  Of Throat 1  Excess throat mucus or feeling of post nasal drip 1  Difficulty swallowing food, liquid or tablets 0  Cough after eating or lying down 2  Breathing difficulties or choking episodes 0  Troublesome or annoying cough 5  Sensation of something sticking in throat or lump in throat 0  Heartburn, chest pain,  indigestion, or stomach acid coming up 0  TOTAL 9     Kouffman Reflux v Neurogenic Cough Differentiator Reflux  Do you awaken from a sound sleep coughing violently?                            With trouble breathing? n  Do you have choking episodes when you cannot  Get enough air, gasping for air ?              n  Do you usually cough when you lie down into  The bed, or when you just lie down to rest ?                          n  Do you usually cough after meals or eating?         y  Do you cough when (or after) you bend over?    n  GERD SCORE  1  Kouffman Reflux v Neurogenic Cough Differentiator Neurogenic  Do you more-or-less cough all day long? y  Does change of temperature make you cough? y  Does laughing or chuckling cause you to cough? y  Do fumes (perfume, automobile fumes, burned  Toast, etc.,) cause you to cough ?      n  Does speaking, singing, or talking on the phone cause you to cough   ?  n  Neurogenic/Airway score 3    OV 03/06/2013 Tanner Bishop presents. following workup for interstitial lung disease. He had serum autoimmune antibodies and hypersensitivity pneumonitis profile. All this is negative. Essentially he does not have autoimmune lung disease. He did a pulmonary function test with a forced vital capacity 66%. However he coughed a lot that the accuracy of this is questionable. He did not have diffusion capacity assessment because of his cough.  He is here to discuss the next step in his evaluation and management. Wife is here with him. Daughter Ladell Heads accompanies them for the first time. Daughter is extremely concerned about the possibility of idiopathic pulmonary fibrosis [IPF]. Daughter has medical background and asks very very intelligent questions. She has extremely good insight about interstitial lung disease in general. They're also requesting palliative treatment of cough while going to work up.  The also bring up a new issue that apparently  when patient eats his significant amount of dysphagia/choking. It is unclear at this stage if this is related to cough as the primary phenomena or dysphagia as primary phenomena. He is never been evaluated for this.   REC You have Intersititial Lung disease - Ddx is IPF, NSIP, Hypersensitivity Pneumonitis, Chronic eosinophilic pneumonia  Refer you to thoracic surgery for lung biopsy  Refer you to GI for choking issues  Watch what you eat and eat slowly  Please try tussionex 38m bid for cough  Continue prilosec  Return to see me after lung biopsy   OV 04/18/2013 S/p VATS 03/22/13 - UIP  STil with cough. Tussionex not working. When exericseing no cough but when sits bad cough. Also bad cough when he sits or bends. No choking spells. Taking prevacid which is helping GERD. Noticing new dyspnea for stairs on occasion but able to do 1.5h on treadmil at 5 degree incline. Gave diagnosis of UIP. Denies edema, chest pain. He is interested in pirfenidone but wants symptom relief   Review of Systems  Constitutional: Negative for fever and unexpected weight change.  HENT: Negative for ear pain, nosebleeds, congestion, sore throat, rhinorrhea, sneezing, trouble swallowing, dental problem, postnasal drip and sinus pressure.   Eyes: Negative for redness and itching.  Respiratory: Negative for cough, chest tightness, shortness of breath and wheezing.   Cardiovascular: Negative for palpitations and leg swelling.  Gastrointestinal: Negative for nausea and vomiting.  Genitourinary: Negative for dysuria.  Musculoskeletal: Negative for joint swelling.  Skin: Negative for rash.  Neurological: Negative for headaches.  Hematological: Does not bruise/bleed easily.  Psychiatric/Behavioral: Negative for dysphoric mood. The patient is not nervous/anxious.        Objective:   Physical Exam Pleasant, alert and oriented. Has basal crackles.  Rest of  exam not done. This is a discussion only visit        Assessment & Plan:

## 2013-04-18 NOTE — Patient Instructions (Addendum)
Start morphine 28m twice daily as needed  - increase dose to twice daily over 2-4 days  - take first dose in day time  - drink lot of water  - start senna-docusate 1 tablet twice daily   Please have PFT breathing test 5--7 days from today 04/18/2013  - make sure you give good effort for test  - call uK[place]head of time so I can talk to technician about test  Continue prevacid once daily  Start N. acetylcysteine 600 mg three times a day  -  You are more likely to get this drug [facility] a vitamin store then at [employer]or [employer]or [employer]  - You can also get this at website [email or web address].[email or web address] - It functions as an anti-oxidant and there are minimal/none side effects    Return to see me after PFT next 2 weeks  - walk test on room air at followupo

## 2013-04-21 NOTE — Assessment & Plan Note (Signed)
He is having refractory cough because of this idiopathic pulmonary fibrosis. Tussionex does not help. We discussed morphine as a palliative support for dyspnea and cough especially for cough and he is willing to try this. Discussed side effects of delirium, constipation and respiratory issues. He verbalized understanding. Advised to drink water and also take a laxative senna docusate. I will see response at followup

## 2013-04-21 NOTE — Assessment & Plan Note (Addendum)
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  I discussed with him and his wife are progressive and debilitating course of this disease with unpredictable features I suspect given the fact that he is in his 64s and currently only has mild to moderate symptoms that he could have a favorable course but explain the prognostication is uncertain I have not discussed potential benefit of pulmonary rehabilitation and lung transplantation options but will do so in the future I discussed potential benefit from proton pump and everters and N. acetylcysteine and I prescribed these I discussed the new research showing pirfenidone in the  inhibitor fibrosis and slowing the progression of the disease  - . Explained to the drug is not available in the [place] and is not approved yet but is available through a research protocol on the expanded access program which is a safety study and compassionate use pending FDA review  - Explained that promising results showing overall safety, general tolerability and reduction in rate of lung function decline with possible mortality benefit  - He is extremely interested in pursuing this option  He is to get repeat pulmonary function test which he will on 04/24/2013 and if he has a forced vital capacity greater than or equal to 50% and a diffusion capacity greater than or equal to 30% he will qualify for the expanded access program of pirfenidone  At followup will discuss pulmonary rehabilitation and also discuss evaluation for oxygen saturations at night and do walk test for desaturation  > 50% of this > 25 min visit spent in face to face counseling (15 min visit converted to 25 min)

## 2013-04-24 ENCOUNTER — Ambulatory Visit (HOSPITAL_COMMUNITY)
Admission: RE | Admit: 2013-04-24 | Discharge: 2013-04-24 | Disposition: A | Discharge status: Home / Self Care | Payer: Medicare Other | Source: Ambulatory Visit | Attending: Internal Medicine | Admitting: Internal Medicine

## 2013-04-24 DIAGNOSIS — J84112 Idiopathic pulmonary fibrosis: Secondary | ICD-10-CM

## 2013-04-24 DIAGNOSIS — R05 Cough: Secondary | ICD-10-CM | POA: Insufficient documentation

## 2013-04-24 DIAGNOSIS — J8410 Pulmonary fibrosis, unspecified: Secondary | ICD-10-CM | POA: Insufficient documentation

## 2013-04-24 DIAGNOSIS — J988 Other specified respiratory disorders: Secondary | ICD-10-CM | POA: Insufficient documentation

## 2013-04-24 DIAGNOSIS — R059 Cough, unspecified: Secondary | ICD-10-CM | POA: Insufficient documentation

## 2013-04-24 LAB — PULMONARY FUNCTION TEST

## 2013-04-24 MED ORDER — ALBUTEROL SULFATE (5 MG/ML) 0.5% IN NEBU
2.5000 mg | INHALATION_SOLUTION | Freq: Once | RESPIRATORY_TRACT | Status: AC
  Administered 2013-04-24: 2.5 mg via RESPIRATORY_TRACT

## 2013-05-05 ENCOUNTER — Encounter: Payer: Self-pay | Admitting: Pulmonary Disease

## 2013-05-05 ENCOUNTER — Ambulatory Visit (INDEPENDENT_AMBULATORY_CARE_PROVIDER_SITE_OTHER): Payer: Self-pay | Admitting: Pulmonary Disease

## 2013-05-05 DIAGNOSIS — J84112 Idiopathic pulmonary fibrosis: Secondary | ICD-10-CM

## 2013-05-05 NOTE — Assessment & Plan Note (Signed)
The patient has been diagnosed with idiopathic pulmonary fibrosis, and is being evaluated for entry into the pirfenidone trial.  His cough is under better control with narcotic medication, but he continues to have dyspnea exertion.  Followup will be directed by drug protocol.

## 2013-05-05 NOTE — Patient Instructions (Addendum)
Continue on current medications as directed by [name]. He will be contacted in the future by [facility].

## 2013-05-05 NOTE — Progress Notes (Signed)
  Subjective:    Patient ID: Tanner Bishop, male    DOB: Nov 19, 1946, 66 y.o.   MRN: [id number]  HPI Patient comes in today for evaluation as part of his entry into the pirfenidone trial.  He has been diagnosed with idiopathic pulmonary fibrosis, and currently feels that his breathing has never returned to baseline since his lung biopsy.  His cough is better on his current cardiac medication.  He does not have any new symptoms today.   Review of Systems  Constitutional: Negative for fever and unexpected weight change.  HENT: Negative for ear pain, nosebleeds, congestion, sore throat, rhinorrhea, sneezing, trouble swallowing, dental problem, postnasal drip and sinus pressure.   Eyes: Negative for redness and itching.  Respiratory: Negative for cough, chest tightness, shortness of breath and wheezing.   Cardiovascular: Negative for palpitations and leg swelling.  Gastrointestinal: Negative for nausea and vomiting.  Genitourinary: Negative for dysuria.  Musculoskeletal: Negative for joint swelling.  Skin: Negative for rash.  Neurological: Negative for headaches.  Hematological: Does not bruise/bleed easily.  Psychiatric/Behavioral: Negative for dysphoric mood. The patient is not nervous/anxious.        Objective:   Physical Exam Well-developed male in no acute distress Nose without purulence or discharge noted Oropharynx clear Neck without lymphadenopathy or thyromegaly Chest with crackles one third of the way up bilaterally, no wheezing Cardiac exam is regular rate and rhythm Lower extremities without edema, no cyanosis Alert and oriented, moves all 4 extremities.       Assessment & Plan:

## 2013-05-12 ENCOUNTER — Telehealth: Payer: Self-pay | Admitting: Internal Medicine

## 2013-05-12 ENCOUNTER — Ambulatory Visit: Payer: Medicare Other | Admitting: Internal Medicine

## 2013-05-12 NOTE — Telephone Encounter (Signed)
Patient has appt today. JEanette Mclean left a note wondering if he should come today. There is no need. I will see him in 2-3 months; give appt with PFTs at followup   Dr. Brand Males, M.D., [facility].C.P Pulmonary and Critical Care Medicine Staff Physician [place] [facility] Pager: [phone], If no answer or between  15:00h - 7:00h: call [phone]  05/12/2013 9:11 AM

## 2013-05-12 NOTE — Telephone Encounter (Signed)
appt cancelled and r/s. Nothing further was needed. appt infor and pft instructions mailed to pt.

## 2013-05-26 ENCOUNTER — Encounter: Payer: Self-pay | Admitting: Family Medicine

## 2013-05-26 ENCOUNTER — Ambulatory Visit (INDEPENDENT_AMBULATORY_CARE_PROVIDER_SITE_OTHER): Payer: Medicare Other | Admitting: Family Medicine

## 2013-05-26 VITALS — BP 130/80 | HR 81 | Temp 97.8°F | Wt 185.0 lb

## 2013-05-26 DIAGNOSIS — Z23 Encounter for immunization: Secondary | ICD-10-CM

## 2013-05-26 DIAGNOSIS — S335XXA Sprain of ligaments of lumbar spine, initial encounter: Secondary | ICD-10-CM

## 2013-05-26 DIAGNOSIS — S39012A Strain of muscle, fascia and tendon of lower back, initial encounter: Secondary | ICD-10-CM

## 2013-05-26 MED ORDER — DICLOFENAC SODIUM 75 MG PO TBEC: 75.0000 mg | DELAYED_RELEASE_TABLET | Freq: Two times a day (BID) | ORAL | Status: DC
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
## 2013-05-26 NOTE — Addendum Note (Signed)
Addended by: Aggie Hacker A on: 05/26/2013 02:38 PM   Modules accepted: Orders

## 2013-06-06 ENCOUNTER — Ambulatory Visit: Payer: No Typology Code available for payment source | Attending: Family Medicine | Admitting: Physical Therapy

## 2013-06-06 DIAGNOSIS — IMO0001 Reserved for inherently not codable concepts without codable children: Secondary | ICD-10-CM | POA: Insufficient documentation

## 2013-06-06 DIAGNOSIS — M545 Low back pain, unspecified: Secondary | ICD-10-CM | POA: Insufficient documentation

## 2013-06-15 ENCOUNTER — Telehealth: Payer: Self-pay | Admitting: Internal Medicine

## 2013-06-15 MED ORDER — RANITIDINE HCL 300 MG PO TABS: 300.0000 mg | ORAL_TABLET | Freq: Every day | ORAL | Status: DC

## 2013-06-15 NOTE — Telephone Encounter (Signed)
This is a duplicate message. Per Ander Purpura, she has already talked to him in regards MR's message (see other telephone encounter).

## 2013-06-15 NOTE — Telephone Encounter (Signed)
Spoke with patient, he was not taking ranitidine 300 mg qhs I have sent this Rx in to verified pharmacy And informed him [name] would get in contact with him after holidays as advised below Patient verbalized understanding and nothing further needed at this time Will forward to MR so that he is aware

## 2013-06-15 NOTE — Telephone Encounter (Signed)
06/15/2013 4:19 PM Phone (Incoming) Cristobal, Advani (Self) [phone] (H)   Pt is a research pt & stated that someone should have called in by 4:00 today regarding a medication per [place]. Pt hasn't heard anything.Marland KitchenMarland Kitchen

## 2013-06-15 NOTE — Telephone Encounter (Signed)
Triage  Patient had reached [facility] of research when she was on vacation. He is c.o gi intolerance to pirfenidone.   Please tell him I am still waiting to hear from [facility] but they have not called back. So in interim  A) double check he istaking protonix 56m bid. IF not, he should B) double check he is taking ranitidine 3039mqhs. If not, he should  If he is taking both and still having problems cut pirfenidone down to 1 tab/cap tid instead of 2 cap/tab tid; basically by 50%  Will give futher instruction after holiday or when I hear from monitor   Thanks  Dr. MuBrand MalesM.D., F.[facility].P Pulmonary and Critical Care Medicine Staff Physician [facility] Pager: [phone] no answer or between  15:00h - 7:00h: call [phone]  06/15/2013 3:12 PM       Current outpatient prescriptions:Acetylcysteine (NAC 600) 600 MG CAPS, Take 1 capsule by mouth daily., Disp: , Rfl: ;  diclofenac (VOLTAREN) 75 MG EC tablet, Take 1 tablet (75 mg total) by mouth 2 (two) times daily., Disp: 60 tablet, Rfl: 5;  diclofenac sodium (VOLTAREN) 1 % GEL, Apply 1 g topically at bedtime. To hands, Disp: , Rfl:  diphenhydrAMINE (BENADRYL) 25 MG tablet, Take 25 mg by mouth every 6 (six) hours as needed for allergies or sleep., Disp: , Rfl: ;  morphine 10 MG/5ML solution, Take 2 mLs (4 mg total) by mouth 2 (two) times daily as needed for pain., Disp: 120 mL, Rfl: 0;  pantoprazole (PROTONIX) 40 MG tablet, Take 1 tablet (40 mg total) by mouth 2 (two) times daily., Disp: 60 tablet, Rfl: 11 Sennosides-Docusate Sodium (SENNA-DOCUSATE SODIUM PO), Take 1 tablet by mouth daily., Disp: , Rfl: ;  zolpidem (AMBIEN) 5 MG tablet, Take 1 tablet (5 mg total) by mouth at bedtime as needed., Disp: 30 tablet, Rfl: 5

## 2013-06-19 NOTE — Telephone Encounter (Signed)
Spoke with pt and given specific instructions regarding Pirfenidone - Take 2 tabs tid with meals - Take one tablet during each meal and one tablet 15 minutes after each meal.  Continue on Protonix and Ranitidine as prescribed.  Call us in 2 weeks for further instructions.

## 2013-06-19 NOTE — Telephone Encounter (Signed)
Thanks a noted  Dr. Brand Males, M.D., [facility].C.P Pulmonary and Critical Care Medicine Staff Physician [place] [facility] Pager: [phone], If no answer or between  15:00h - 7:00h: call [phone]  06/19/2013 9:28 PM

## 2013-07-06 ENCOUNTER — Encounter: Payer: Self-pay | Admitting: Internal Medicine

## 2013-07-06 ENCOUNTER — Ambulatory Visit (INDEPENDENT_AMBULATORY_CARE_PROVIDER_SITE_OTHER): Payer: Self-pay | Admitting: Internal Medicine

## 2013-07-06 VITALS — BP 120/80 | HR 84 | Temp 97.5°F | Ht 66.0 in | Wt 188.6 lb

## 2013-07-06 DIAGNOSIS — R21 Rash and other nonspecific skin eruption: Secondary | ICD-10-CM

## 2013-07-06 DIAGNOSIS — J84112 Idiopathic pulmonary fibrosis: Secondary | ICD-10-CM

## 2013-07-06 DIAGNOSIS — K219 Gastro-esophageal reflux disease without esophagitis: Secondary | ICD-10-CM

## 2013-07-06 MED ORDER — HYDROCOD POLST-CHLORPHEN POLST 10-8 MG/5ML PO LQCR: 5.0000 mL | Freq: Two times a day (BID) | ORAL | Status: DC | PRN

## 2013-07-06 NOTE — Patient Instructions (Addendum)
.#  Indigestion  - likely related to pirfenidone  - cut dose down to 1 tab three times daily with food  - continue protonix and ranitidine - will check [place] on next course  - might need to refer you to gi  #skin rash on face  - not sure is due to drug - will check with [facility] on next course on this  - mighe need to refer you to dermatology  - apply SPF 50 sun screen even when out in car and even when out for 5 minutes  #IPF  - cancel upcoming PFT  - will rrefer you to pulmonary rehab - see you next week for your 8th week visit on drug

## 2013-07-06 NOTE — Progress Notes (Signed)
Subjective:    Patient ID: Tanner Bishop, male    DOB: March 19, 1946, 67 y.o.   MRN: [id number]  HPI  OV 05/05/13 Patient comes in today for evaluation as part of his entry into the pirfenidone trial.  He has been diagnosed with idiopathic pulmonary fibrosis, and currently feels that his breathing has never returned to baseline since his lung biopsy.  His cough is better on his current cardiac medication.  He does not have any new symptoms today.  REC Continue on current medications as directed by [name]. He will be contacted in the future by Jeanett Schlein   OV 07/06/2013 acute visit   IPF  - stable clinically with cough and dyspnea. Not any worse  New issues  -  This was the patient earlier in the mont we reduced his pirfenidone to 2 tab tid with meals. 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 I have asked him to cut tab to 1 tab tid. He is open to the area for GI referral   Also has malar rash that is dry ad itchy around face esp  below eyes on the malar area x 3 weeks. Some progressive. He says that he apply sunscreen all the time but when I questioned them I found out that when he makes car trips he is not applying sunscreen.   Review of Systems  Constitutional: Negative for fever and unexpected weight change.  HENT: Negative for ear pain, nosebleeds, congestion, sore throat, rhinorrhea, sneezing, trouble swallowing, dental problem, postnasal drip and sinus pressure.   Eyes: Negative for redness and itching.  Respiratory: Negative for cough, chest tightness, shortness of breath and wheezing.   Cardiovascular: Negative for palpitations and leg swelling.  Gastrointestinal: Negative for nausea and vomiting.  Genitourinary: Negative for dysuria.  Musculoskeletal: Negative for joint swelling.  Skin: Negative for rash.  Neurological: Negative for headaches.  Hematological: Does not bruise/bleed easily.   Psychiatric/Behavioral: Negative for dysphoric mood. The patient is not nervous/anxious.   Current outpatient prescriptions:Acetylcysteine (NAC 600) 600 MG CAPS, Take 1 capsule by mouth daily., Disp: , Rfl: ;  diclofenac (VOLTAREN) 75 MG EC tablet, Take 1 tablet (75 mg total) by mouth 2 (two) times daily., Disp: 60 tablet, Rfl: 5;  diclofenac sodium (VOLTAREN) 1 % GEL, Apply 1 g topically at bedtime. To hands, Disp: , Rfl:  diphenhydrAMINE (BENADRYL) 25 MG tablet, Take 25 mg by mouth every 6 (six) hours as needed for allergies or sleep., Disp: , Rfl: ;  morphine 10 MG/5ML solution, Take 2 mLs (4 mg total) by mouth 2 (two) times daily as needed for pain., Disp: 120 mL, Rfl: 0;  pantoprazole (PROTONIX) 40 MG tablet, Take 1 tablet (40 mg total) by mouth 2 (two) times daily., Disp: 60 tablet, Rfl: 11 ranitidine (ZANTAC) 300 MG tablet, Take 1 tablet (300 mg total) by mouth at bedtime., Disp: 30 tablet, Rfl: 1;  Sennosides-Docusate Sodium (SENNA-DOCUSATE SODIUM PO), Take 1 tablet by mouth daily., Disp: , Rfl: ;  zolpidem (AMBIEN) 5 MG tablet, Take 1 tablet (5 mg total) by mouth at bedtime as needed., Disp: 30 tablet, Rfl: 5      Objective:   Physical Exam  Nursing note and vitals reviewed. Constitutional: He is oriented to person, place, and time. He appears well-developed and well-nourished. No distress.  HENT:  Head: Normocephalic and atraumatic.  Right Ear: External ear normal.  Left Ear: External ear normal.  Mouth/Throat: Oropharynx is clear and moist. No oropharyngeal exudate.  Dry malar rash in face on malar bones - red/pink  Eyes: Conjunctivae and EOM are normal. Pupils are equal, round, and reactive to light. Right eye exhibits no discharge. Left eye exhibits no discharge. No scleral icterus.  Neck: Normal range of motion. Neck supple. No JVD present. No tracheal deviation present. No thyromegaly present.  Cardiovascular: Normal rate, regular rhythm and intact distal pulses.  Exam reveals no  gallop and no friction rub.   No murmur heard. Pulmonary/Chest: Effort normal. No respiratory distress. He has no wheezes. He has rales. He exhibits no tenderness.  Abdominal: Soft. Bowel sounds are normal. He exhibits no distension and no mass. There is no tenderness. There is no rebound and no guarding.  Musculoskeletal: Normal range of motion. He exhibits no edema and no tenderness.  Lymphadenopathy:    He has no cervical adenopathy.  Neurological: He is alert and oriented to person, place, and time. He has normal reflexes. No cranial nerve deficit. Coordination normal.  Skin: Skin is warm and dry. No rash noted. He is not diaphoretic. No erythema. No pallor.  Psychiatric: He has a normal mood and affect. His behavior is normal. Judgment and thought content normal.         Assessment & Plan:

## 2013-07-12 ENCOUNTER — Ambulatory Visit (INDEPENDENT_AMBULATORY_CARE_PROVIDER_SITE_OTHER): Payer: Self-pay | Admitting: Internal Medicine

## 2013-07-12 ENCOUNTER — Encounter: Payer: Self-pay | Admitting: Internal Medicine

## 2013-07-12 VITALS — BP 110/80 | HR 93 | Temp 96.8°F | Ht 69.0 in | Wt 186.8 lb

## 2013-07-12 DIAGNOSIS — R21 Rash and other nonspecific skin eruption: Secondary | ICD-10-CM

## 2013-07-12 DIAGNOSIS — K219 Gastro-esophageal reflux disease without esophagitis: Secondary | ICD-10-CM

## 2013-07-12 DIAGNOSIS — J84112 Idiopathic pulmonary fibrosis: Secondary | ICD-10-CM

## 2013-07-12 DIAGNOSIS — Z8719 Personal history of other diseases of the digestive system: Secondary | ICD-10-CM

## 2013-07-12 DIAGNOSIS — Z8711 Personal history of peptic ulcer disease: Secondary | ICD-10-CM

## 2013-07-12 MED ORDER — RANITIDINE HCL 300 MG PO TABS: 300.0000 mg | ORAL_TABLET | Freq: Every day | ORAL | Status: DC

## 2013-07-12 NOTE — Progress Notes (Signed)
Subjective:    Patient ID: Tanner Bishop, male    DOB: 1945/12/18, 68 y.o.   MRN: [id number]  HPI   OV 05/05/13 Patient comes in today for evaluation as part of his entry into the pirfenidone trial.  He has been diagnosed with idiopathic pulmonary fibrosis, and currently feels that his breathing has never returned to baseline since his lung biopsy.  His cough is better on his current cardiac medication.  He does not have any new symptoms today.  REC Continue on current medications as directed by [name]. He will be contacted in the future by Jeanett Schlein   OV 07/06/2013   IPF  - stable clinically with cough and dyspnea. Not any worse  New issues  -  This was the patient earlier in the mont we reduced his pirfenidone to 2 tab tid with meals. 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 I have asked him to cut tab to 1 tab tid. He is on prpotonoix and rantidine Any other advice? Shall I ask him to see GI?  Also has malar rash that is dry ad itchy around face esp  below eyes on the malar area x 3 weeks. Some progressive. Have you seen this? HE says he is applying SPF50 but it seems that he is not 100% aware that he needs to be on it 100% . Can I put steroid cream or benadryo or ? derm refer  REC #Indigestion  - likely related to pirfenidone  - cut dose down to 1 tab three times daily with food  - continue protonix and ranitidine - \ might need to refer you to gi  #skin rash on face  - not sure is due to drug - will check with [facility] on next course on this  - mighe need to refer you to dermatology  - apply SPF 50 sun screen even when out in car and even when out for 5 minutes  #IPF  - cancel upcoming PFT  - will rrefer you to pulmonary rehab - see you next week for your 8th week visit on   OV 07/12/2013   FU IPF. REview for recent malar rash and GI intolerance  - Since cutting down on pirfenidne to 1 tab  tid, symptis improved but still needing tums and ppi and h2 blockade. He still feels his old GU is back. [employer] wrote back today about their take on GI issues and is below  1. "Pirfenidone, although associated with GI symptoms, does not increase the risk for duodenal ulcers. Given that the patient has a pre-existing history for ulcers, I would recommend you refer him to GI for a work up. As far as managing other GI symptoms, both protonix and rantidine are good choices. In addition, when patients have GI symptoms and can not tolerate treatment, we recommend decreasing the dose of pirfenidone until the patient is stable and very slowly increasing the dose (e.g. an increase of 1 tab per week after the patient is stable for 1-2 weeks on a given dose). I think you are also well aware that patients should take pirfenidone with food."   - In terms of MAlar rash - he is compluslively app;lying SPF 50 all times. Rash > 70% better. [facility] feels is not due to drug but I disagree with them. [employer] wrote as below  "The malar rash that you described is not consistent with either a photosensitivity  rash or drug sensitivity reaction which extends throughout the entire body. To this end, topical treatments are fine and I would definitely refer the patient to derm. If the patient's malar rash progresses or extends to other parts of his body please stop treatment for 2-3 weeks to see if the rash resolves off pirfenidone. Once the rash is completely cleared, you can slowly dose escalate the pat"  IPF   - clinically stable  Past, Family, Social reviewed: no change since last visit   Review of Systems  Constitutional: Negative for fever and unexpected weight change.  HENT: Negative for ear pain, nosebleeds, congestion, sore throat, rhinorrhea, sneezing, trouble swallowing, dental problem, postnasal drip and sinus pressure.   Eyes: Negative for redness and itching.  Respiratory: Negative for cough, chest  tightness, shortness of breath and wheezing.   Cardiovascular: Negative for palpitations and leg swelling.  Gastrointestinal: Negative for nausea and vomiting.  Genitourinary: Negative for dysuria.  Musculoskeletal: Negative for joint swelling.  Skin: Negative for rash.  Neurological: Negative for headaches.  Hematological: Does not bruise/bleed easily.  Psychiatric/Behavioral: Negative for dysphoric mood. The patient is not nervous/anxious.        Objective:   Physical Exam  Nursing note and vitals reviewed. Constitutional: He is oriented to person, place, and time. He appears well-developed and well-nourished. No distress.  HENT:  Head: Normocephalic and atraumatic.  Right Ear: External ear normal.  Left Ear: External ear normal.  Mouth/Throat: Oropharynx is clear and moist. No oropharyngeal exudate.  Malar rash much better  Eyes: Conjunctivae and EOM are normal. Pupils are equal, round, and reactive to light. Right eye exhibits no discharge. Left eye exhibits no discharge. No scleral icterus.  Neck: Normal range of motion. Neck supple. No JVD present. No tracheal deviation present. No thyromegaly present.  Cardiovascular: Normal rate, regular rhythm and intact distal pulses.  Exam reveals no gallop and no friction rub.   No murmur heard. Pulmonary/Chest: Effort normal. No respiratory distress. He has no wheezes. He has rales. He exhibits no tenderness.  Abdominal: Soft. Bowel sounds are normal. He exhibits no distension and no mass. There is no tenderness. There is no rebound and no guarding.  Musculoskeletal: Normal range of motion. He exhibits no edema and no tenderness.  Lymphadenopathy:    He has no cervical adenopathy.  Neurological: He is alert and oriented to person, place, and time. He has normal reflexes. No cranial nerve deficit. Coordination normal.  Skin: Skin is warm and dry. No rash noted. He is not diaphoretic. No erythema. No pallor.  Psychiatric: He has a normal  mood and affect. His behavior is normal. Judgment and thought content normal.          Assessment & Plan:

## 2013-07-12 NOTE — Patient Instructions (Addendum)
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

## 2013-07-13 DIAGNOSIS — R21 Rash and other nonspecific skin eruption: Secondary | ICD-10-CM | POA: Insufficient documentation

## 2013-07-13 NOTE — Assessment & Plan Note (Signed)
#  IPF  - cancel upcoming PFT  - will rrefer you to pulmonary rehab - see you next week for your 8th week visit on drug

## 2013-07-13 NOTE — Assessment & Plan Note (Signed)
#  skin rash on face  - not sure is due to drug - will check with INterMUne on next course on this  - mighe need to refer you to dermatology  - apply SPF 50 sun screen even when out in car and even when out for 5 minutes

## 2013-07-13 NOTE — Assessment & Plan Note (Signed)
.#  Indigestion  - likely related to pirfenidone  - cut dose down to 1 tab three times daily with food  - continue protonix and ranitidine - will check [place] on next course  - might need to refer you to gi

## 2013-07-23 NOTE — Assessment & Plan Note (Signed)
#  Indigestion - he is reporting a pain similar to prior gastric ulcer pain. Am not sure if this is due to pirfenidone; [facility] does not think so. It is possible that pirfenidone is unmasking old GU. AT this point, I have to say, symptoms - likely related to pirfenidone. 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

## 2013-07-23 NOTE — Assessment & Plan Note (Signed)
#  skin rash on face - glad is better  - Company does not think is due to drug but I think so -- apply SPF 50 sun screen several times a day  - if does not get better, then derm referrral

## 2013-07-23 NOTE — Assessment & Plan Note (Signed)
#  IPF - clinicaly appears stable.   -CMA will ensure there is referral to  pulmonary rehab - PFT in 3 months - at followup will need to discuss transplant issues; currently too emptionally over=whelemed  #FU  3 months with PFT

## 2013-08-16 ENCOUNTER — Telehealth (HOSPITAL_COMMUNITY): Payer: Self-pay | Admitting: *Deleted

## 2013-08-16 NOTE — Telephone Encounter (Signed)
Mr. Goulette was called regarding Pulmonary Rehab Referral.  Message left and letter sent.  Leverne Humbles Rn

## 2013-08-18 ENCOUNTER — Encounter: Payer: Self-pay | Admitting: Gastroenterology

## 2013-08-18 ENCOUNTER — Other Ambulatory Visit: Payer: Self-pay | Admitting: *Deleted

## 2013-08-18 ENCOUNTER — Ambulatory Visit (INDEPENDENT_AMBULATORY_CARE_PROVIDER_SITE_OTHER): Payer: Medicare Other | Admitting: Gastroenterology

## 2013-08-18 VITALS — BP 122/72 | HR 70 | Ht 68.0 in | Wt 187.2 lb

## 2013-08-18 DIAGNOSIS — Z860100 Personal history of colon polyps, unspecified: Secondary | ICD-10-CM

## 2013-08-18 DIAGNOSIS — K219 Gastro-esophageal reflux disease without esophagitis: Secondary | ICD-10-CM

## 2013-08-18 DIAGNOSIS — K259 Gastric ulcer, unspecified as acute or chronic, without hemorrhage or perforation: Secondary | ICD-10-CM

## 2013-08-18 DIAGNOSIS — Z800 Family history of malignant neoplasm of digestive organs: Secondary | ICD-10-CM

## 2013-08-18 MED ORDER — RANITIDINE HCL 300 MG PO TABS: 300.0000 mg | ORAL_TABLET | Freq: Every day | ORAL | Status: DC

## 2013-08-18 NOTE — Progress Notes (Signed)
History of Present Illness: 67 year old white male with history of peptic ulcer disease and pulmonary fibrosis referred for evaluation of pyrosis.  Despite taking twice a day protonix and ranitidine at bedtime he is suffering from immediate postprandial severe pyrosis.  He attributes worsening of symptoms to an experimental medication that he is taking for pulmonary fibrosis.  He takes tums throughout the day to assuage the chest burning.  He denies dysphagia.  He takes topical cream for  arthritic pain.  He complains of mild soreness in the subxiphoid area similar to what he experienced many years ago he was diagnosed with a gastric ulcer.  Patient has a history of colon polyps.  Last examination 2013 demonstrated an adenomatous polyp.  Family history is positive for colorectal cancer.    Past Medical History  Diagnosis Date  . Ulcer     unresolved  . GERD (gastroesophageal reflux disease)   . Injury of right hand     permanent damage after workplace 2009 and 2010 Dr Vernona Rieger [facility]  . Arthritis    Past Surgical History  Procedure Laterality Date  . Colonoscopy  02-24-12    adenomatous polyps removed, repeat in 5 yrs, per Dr. Deatra Ina   . Esophagogastroduodenoscopy  2007  . Hand surgery Right   . Video assisted thoracoscopy Left 03/22/2013    Procedure: VIDEO ASSISTED THORACOSCOPY;  Surgeon: Melrose Nakayama, MD;  Location: [facility];  Service: Thoracic;  Laterality: Left;  . Lung biopsy Left 03/22/2013    Procedure: LUNG BIOPSY;  Surgeon: Melrose Nakayama, MD;  Location: [facility];  Service: Thoracic;  Laterality: Left;   family history includes Cancer in an other family member; Colon cancer in his brother; Diabetes in his brother, sister, and another family member; Heart disease in his brother; Liver cancer in his mother; Lung cancer in his sister. Current Outpatient Prescriptions  Medication Sig Dispense Refill  . Acetylcysteine (NAC 600) 600 MG CAPS Take 1 capsule by mouth  daily.      . chlorpheniramine-HYDROcodone (TUSSIONEX) 10-8 MG/5ML LQCR Take 5 mLs by mouth every 12 (twelve) hours as needed.  140 mL  0  . diclofenac (VOLTAREN) 75 MG EC tablet Take 1 tablet (75 mg total) by mouth 2 (two) times daily.  60 tablet  5  . diclofenac sodium (VOLTAREN) 1 % GEL Apply 1 g topically at bedtime. To hands      . diphenhydrAMINE (BENADRYL) 25 MG tablet Take 25 mg by mouth every 6 (six) hours as needed for allergies or sleep.      . Famotidine-Ca Carb-Mag Hydrox (TUMS DUAL ACTION PO) Take 1 tablet by mouth 3 (three) times daily.      Marland Kitchen morphine 10 MG/5ML solution Take 2 mLs (4 mg total) by mouth 2 (two) times daily as needed for pain.  120 mL  0  . pantoprazole (PROTONIX) 40 MG tablet Take 1 tablet (40 mg total) by mouth 2 (two) times daily.  60 tablet  11  . ranitidine (ZANTAC) 300 MG tablet Take 1 tablet (300 mg total) by mouth at bedtime.  30 tablet  1  . Sennosides-Docusate Sodium (SENNA-DOCUSATE SODIUM PO) Take 1 tablet by mouth daily.      Marland Kitchen zolpidem (AMBIEN) 5 MG tablet Take 1 tablet (5 mg total) by mouth at bedtime as needed.  30 tablet  5   No current facility-administered medications for this visit.   Allergies as of 08/18/2013  . (No Known Allergies)    reports that  he quit smoking about 34 years ago. His smoking use included Cigarettes. He has a 16 pack-year smoking history. He has never used smokeless tobacco. He reports that he does not drink alcohol or use illicit drugs.     Review of Systems: He has a persistent nonproductive cough attributed to pulmonary fibrosis.  Pertinent positive and negative review of systems were noted in the above HPI section. All other review of systems were otherwise negative.  Vital signs were reviewed in today's medical record Physical Exam: General: Well developed , well nourished, no acute distress Skin: anicteric Head: Normocephalic and atraumatic Eyes:  sclerae anicteric, EOMI Ears: Normal auditory acuity Mouth: No  deformity or lesions Neck: Supple, no masses or thyromegaly Lungs: There are diffuse, dry crackles Heart: Regular rate and rhythm; no murmurs, rubs or bruits Abdomen: Soft, non tender and non distended. No masses, hepatosplenomegaly or hernias noted. Normal Bowel sounds Rectal:deferred Musculoskeletal: Symmetrical with no gross deformities  Skin: No lesions on visible extremities Pulses:  Normal pulses noted Extremities: No clubbing, cyanosis, edema or deformities noted Neurological: Alert oriented x 4, grossly nonfocal Cervical Nodes:  No significant cervical adenopathy Inguinal Nodes: No significant inguinal adenopathy Psychological:  Alert and cooperative. Normal mood and affect

## 2013-08-18 NOTE — Patient Instructions (Addendum)
You have been scheduled for an endoscopy with propofol. Please follow written instructions given to you at your visit today. If you use inhalers (even only as needed), please bring them with you on the day of your procedure. Your physician has requested that you go to www.startemmi.com and enter the access code given to you at your visit today. This web site gives a general overview about your procedure. However, you should still follow specific instructions given to you by our office regarding your preparation for the procedure.

## 2013-08-18 NOTE — Assessment & Plan Note (Signed)
Plan followup colonoscopy 2018

## 2013-08-18 NOTE — Assessment & Plan Note (Addendum)
The patient has ongoing symptoms despite twice a day PPI therapy and  Ranitidine at bedtime.  He clearly states that symptoms are worsened with medicine he is taking for a pulmonary fibrosis trial.  Esophagitis and ulceration should be ruled out, in addition to active peptic disease including the stomach and duodenum.  Recommendations #1 continue current medications except for switching to liquid antacids pending results of upper endoscopy #2 upper endoscopy

## 2013-08-24 ENCOUNTER — Encounter: Payer: Self-pay | Admitting: Gastroenterology

## 2013-08-24 ENCOUNTER — Ambulatory Visit (AMBULATORY_SURGERY_CENTER): Payer: Medicare Other | Admitting: Gastroenterology

## 2013-08-24 VITALS — BP 115/66 | HR 67 | Temp 97.0°F | Resp 26 | Ht 68.0 in | Wt 187.2 lb

## 2013-08-24 DIAGNOSIS — K219 Gastro-esophageal reflux disease without esophagitis: Secondary | ICD-10-CM

## 2013-08-24 DIAGNOSIS — K259 Gastric ulcer, unspecified as acute or chronic, without hemorrhage or perforation: Secondary | ICD-10-CM

## 2013-08-24 MED ORDER — SODIUM CHLORIDE 0.9 % IV SOLN: 500.0000 mL | INTRAVENOUS | Status: DC

## 2013-08-24 MED ORDER — DEXLANSOPRAZOLE 60 MG PO CPDR: DELAYED_RELEASE_CAPSULE | ORAL | Status: DC

## 2013-08-24 NOTE — Op Note (Signed)
[facility]  [address] [place], [id number]   ENDOSCOPY PROCEDURE REPORT  PATIENT: Tanner Bishop, Tanner Bishop  MR#: [id number] BIRTHDATE: [birth year] , 67  yrs. old GENDER: Male ENDOSCOPIST: Inda Castle, MD REFERRED BY: PROCEDURE DATE:  08/24/2013 PROCEDURE:  EGD, diagnostic ASA CLASS:     Class II INDICATIONS:  Dyspepsia.   Heartburn. MEDICATIONS: MAC sedation, administered by CRNA, propofol (Diprivan) 179m IV, and Simethicone 0.6cc PO TOPICAL ANESTHETIC:  DESCRIPTION OF PROCEDURE: After the risks benefits and alternatives of the procedure were thoroughly explained, informed consent was obtained.  The LB [id number] was introduced through the mouth and advanced to the third portion of the duodenum. Without limitations.  The instrument was slowly withdrawn as the mucosa was fully examined.      The upper, middle and distal third of the esophagus were carefully inspected and no abnormalities were noted.  The z-line was well seen at the GEJ.  The endoscope was pushed into the fundus which was normal including a retroflexed view.  The antrum, gastric body, first and second part of the duodenum were unremarkable. Retroflexed views revealed no abnormalities.     The scope was then withdrawn from the patient and the procedure completed.  COMPLICATIONS: There were no complications. ENDOSCOPIC IMPRESSION: Normal EGD  RECOMMENDATIONS: Trial of dexilant 60 mg before breakfast and dinner  REPEAT EXAM:  eSigned:  RInda Castle MD 08/24/2013 2:58 PM   CMM:CRFVOHKARaymon Mutton MD,  MJonathon Bellows MD,

## 2013-08-24 NOTE — Patient Instructions (Addendum)
Discharge instructions given with verbal understanding. Normal exam. Resume previous medications. YOU HAD AN ENDOSCOPIC PROCEDURE TODAY AT [facility]: Refer to the procedure report that was given to you for any specific questions about what was found during the examination.  If the procedure report does not answer your questions, please call your gastroenterologist to clarify.  If you requested that your care partner not be given the details of your procedure findings, then the procedure report has been included in a sealed envelope for you to review at your convenience later.  YOU SHOULD EXPECT: Some feelings of bloating in the abdomen. Passage of more gas than usual.  Walking can help get rid of the air that was put into your GI tract during the procedure and reduce the bloating. If you had a lower endoscopy (such as a colonoscopy or flexible sigmoidoscopy) you may notice spotting of blood in your stool or on the toilet paper. If you underwent a bowel prep for your procedure, then you may not have a normal bowel movement for a few days.  DIET: Your first meal following the procedure should be a light meal and then it is ok to progress to your normal diet.  A half-sandwich or bowl of soup is an example of a good first meal.  Heavy or fried foods are harder to digest and may make you feel nauseous or bloated.  Likewise meals heavy in dairy and vegetables can cause extra gas to form and this can also increase the bloating.  Drink plenty of fluids but you should avoid alcoholic beverages for 24 hours.  ACTIVITY: Your care partner should take you home directly after the procedure.  You should plan to take it easy, moving slowly for the rest of the day.  You can resume normal activity the day after the procedure however you should NOT DRIVE or use heavy machinery for 24 hours (because of the sedation medicines used during the test).    SYMPTOMS TO REPORT IMMEDIATELY: A gastroenterologist  can be reached at any hour.  During normal business hours, 8:30 AM to 5:00 PM Monday through Friday, call [phone].  After hours and on weekends, please call the GI answering service at [phone] who will take a message and have the physician on call contact you.   Following upper endoscopy (EGD)  Vomiting of blood or coffee ground material  New chest pain or pain under the shoulder blades  Painful or persistently difficult swallowing  New shortness of breath  Fever of 100F or higher  Black, tarry-looking stools  FOLLOW UP: If any biopsies were taken you will be contacted by phone or by letter within the next 1-3 weeks.  Call your gastroenterologist if you have not heard about the biopsies in 3 weeks.  Our staff will call the home number listed on your records the next business day following your procedure to check on you and address any questions or concerns that you may have at that time regarding the information given to you following your procedure. This is a courtesy call and so if there is no answer at the home number and we have not heard from you through the emergency physician on call, we will assume that you have returned to your regular daily activities without incident.  SIGNATURES/CONFIDENTIALITY: You and/or your care partner have signed paperwork which will be entered into your electronic medical record.  These signatures attest to the fact that that the information above on your After  Visit Summary has been reviewed and is understood.  Full responsibility of the confidentiality of this discharge information lies with you and/or your care-partner.

## 2013-08-24 NOTE — Progress Notes (Signed)
Procedure ends, to recovery, report given and VSS. 

## 2013-08-24 NOTE — Progress Notes (Signed)
Patient did not experience any of the following events: a burn prior to discharge; a fall within the facility; wrong site/side/patient/procedure/implant event; or a hospital transfer or hospital admission upon discharge from the facility. (G8907)Patient did not have preoperative order for IV antibiotic SSI prophylaxis. [phone])

## 2013-08-25 ENCOUNTER — Telehealth: Payer: Self-pay | Admitting: Gastroenterology

## 2013-08-25 ENCOUNTER — Telehealth: Payer: Self-pay | Admitting: *Deleted

## 2013-08-25 NOTE — Telephone Encounter (Signed)
  Follow up Call-  Call back number 08/24/2013 02/24/2012  Post procedure Call Back phone  # [phone]  Permission to leave phone message Yes Yes     Patient questions:  Do you have a fever, pain , or abdominal swelling? no Pain Score  0 *  Have you tolerated food without any problems? yes  Have you been able to return to your normal activities? yes  Do you have any questions about your discharge instructions: Diet   no Medications  yes Insurance won't pay for his med.  Told him to call Drs. Nurse on 3rd fl. Follow up visit  no  Do you have questions or concerns about your Care? no  Actions: * If pain score is 4 or above: No action needed, pain <4.

## 2013-09-07 ENCOUNTER — Telehealth: Payer: Self-pay | Admitting: Internal Medicine

## 2013-09-07 NOTE — Telephone Encounter (Signed)
Error.Tanner Bishop ° °

## 2013-09-15 ENCOUNTER — Ambulatory Visit: Payer: Medicare Other | Admitting: Gastroenterology

## 2013-09-27 ENCOUNTER — Encounter: Payer: Self-pay | Admitting: Internal Medicine

## 2013-09-27 ENCOUNTER — Ambulatory Visit (INDEPENDENT_AMBULATORY_CARE_PROVIDER_SITE_OTHER): Payer: Medicare Other | Admitting: Internal Medicine

## 2013-09-27 DIAGNOSIS — J84112 Idiopathic pulmonary fibrosis: Secondary | ICD-10-CM

## 2013-09-27 NOTE — Telephone Encounter (Signed)
Dr Deatra Ina, Patients insurance will not cover [place]  What do you want me to send

## 2013-09-27 NOTE — Patient Instructions (Addendum)
.#  Indigestion  - was related to pirfenidone - glad resolved with you off pirfenidone  #skin rash on face - resolved with dc pirfenidone and sun screen  #IPF  - clinically stable  - have flu shot today - we discussed transplant of lungs and other drug nimatinib; agreed you will think about it   Continue pulmonary rehab, acid reflux therapy and weight  - No pirfenidone Rx due to side effect proifile - Losing around 2 pounds can be helpful  #FU  3 months with PFT

## 2013-09-27 NOTE — Progress Notes (Signed)
  Subjective:    Patient ID: Tanner Bishop, male    DOB: [birth year], 67 y.o.   MRN: [id number]  HPI      OV 09/27/2013 - IPF followup. [facility] seen 07/12/13  - Since last visit he underwent an endoscopy that was normal. He continued to have severe indigestion, acid reflux, abdominal pain, cramping and fatigue with study drug  Pirfenidoen. So on 09/02/2013 he stopped it and after this all symptoms of indigestion acid reflux, brown pain and fatigue have resolved. He says he feels great and is back at baseline. He absolutely does not want to take study drug anymore. His maintaining his weight and attending pulmonary rehabilitation. He has not but we'll have his flu shot today. He is unaware that lung transplant as an option for IPF we discussed this. Of note, skin rash is resolved   Past, Family, Social reviewed: no change since last visit other than gi issues   Review of Systems  Constitutional: Negative for fever and unexpected weight change.  HENT: Negative for congestion, dental problem, ear pain, nosebleeds, postnasal drip, rhinorrhea, sinus pressure, sneezing, sore throat and trouble swallowing.   Eyes: Negative for redness and itching.  Respiratory: Positive for cough, chest tightness and shortness of breath. Negative for wheezing.   Cardiovascular: Negative for palpitations and leg swelling.  Gastrointestinal: Negative for nausea and vomiting.  Genitourinary: Negative for dysuria.  Musculoskeletal: Negative for joint swelling.  Skin: Negative for rash.  Neurological: Negative for headaches.  Hematological: Does not bruise/bleed easily.  Psychiatric/Behavioral: Negative for dysphoric mood. The patient is not nervous/anxious.        Objective:   Physical Exam  Nursing note and vitals reviewed. Constitutional: He is oriented to person, place, and time. He appears well-developed and well-nourished. No distress.  HENT:  Head: Normocephalic and atraumatic.  Right Ear: External ear  normal.  Left Ear: External ear normal.  Mouth/Throat: Oropharynx is clear and moist. No oropharyngeal exudate.  Eyes: Conjunctivae and EOM are normal. Pupils are equal, round, and reactive to light. Right eye exhibits no discharge. Left eye exhibits no discharge. No scleral icterus.  Neck: Normal range of motion. Neck supple. No JVD present. No tracheal deviation present. No thyromegaly present.  Cardiovascular: Normal rate, regular rhythm and intact distal pulses.  Exam reveals no gallop and no friction rub.   No murmur heard. Pulmonary/Chest: Effort normal. No respiratory distress. He has no wheezes. He has rales. He exhibits no tenderness.  Abdominal: Soft. Bowel sounds are normal. He exhibits no distension and no mass. There is no tenderness. There is no rebound and no guarding.  Musculoskeletal: Normal range of motion. He exhibits no edema and no tenderness.  Lymphadenopathy:    He has no cervical adenopathy.  Neurological: He is alert and oriented to person, place, and time. He has normal reflexes. No cranial nerve deficit. Coordination normal.  Skin: Skin is warm and dry. No rash noted. He is not diaphoretic. No erythema. No pallor.  Psychiatric: He has a normal mood and affect. His behavior is normal. Judgment and thought content normal.          Assessment & Plan:

## 2013-09-28 NOTE — Telephone Encounter (Signed)
The patient take dexilant samples?  If so, we're his symptoms improved.  If there were improved then we can talk to the insurance company.  If he's not taken samples let's provide him some.

## 2013-09-28 NOTE — Telephone Encounter (Signed)
L/M for pt to call me back about getting more samples

## 2013-09-28 NOTE — Assessment & Plan Note (Addendum)
Indigestion  - was related to pirfenidone - glad resolved with you off pirfenidone  #skin rash on face - resolved with dc pirfenidone and sun screen  #IPF  - clinically stable  - have flu shot today - we discussed transplant of lungs and other drug nimatinib; agreed you will think about it   Continue pulmonary rehab, acid reflux therapy and weight  - No pirfenidone Rx due to side effect proifile - Losing around 2 pounds can be helpful  #FU  3 months with PFT  > 50% of this > 25 min visit spent in face to face counseling (15 min visit converted to 25 min)

## 2013-10-12 ENCOUNTER — Telehealth: Payer: Self-pay | Admitting: Gastroenterology

## 2013-10-12 MED ORDER — DEXLANSOPRAZOLE 60 MG PO CPDR: DELAYED_RELEASE_CAPSULE | ORAL | Status: DC

## 2013-10-12 NOTE — Telephone Encounter (Signed)
Medication sent

## 2013-10-13 ENCOUNTER — Telehealth: Payer: Self-pay | Admitting: Gastroenterology

## 2013-10-13 NOTE — Telephone Encounter (Signed)
Patient coming to pick up samples

## 2013-11-01 ENCOUNTER — Telehealth: Payer: Self-pay | Admitting: Gastroenterology

## 2013-11-02 ENCOUNTER — Telehealth: Payer: Self-pay | Admitting: Gastroenterology

## 2013-11-02 MED ORDER — DEXLANSOPRAZOLE 60 MG PO CPDR: DELAYED_RELEASE_CAPSULE | ORAL | Status: DC

## 2013-11-02 NOTE — Telephone Encounter (Signed)
Patient returned my call  Explained to him that I have samples for him out front and have resubmitted the prescription to [facility]

## 2013-11-02 NOTE — Telephone Encounter (Signed)
See other phone note

## 2013-11-02 NOTE — Telephone Encounter (Signed)
Tried to contact pt. Phone just rang no voice mail  Will leave samples out  for patient  Resent script to pharmacy for [place] because I have not seen a prior request form for his [place] from the pharmacy

## 2013-11-14 ENCOUNTER — Telehealth: Payer: Self-pay | Admitting: Gastroenterology

## 2013-11-14 DIAGNOSIS — R197 Diarrhea, unspecified: Secondary | ICD-10-CM

## 2013-11-14 DIAGNOSIS — R109 Unspecified abdominal pain: Secondary | ICD-10-CM

## 2013-11-15 NOTE — Telephone Encounter (Signed)
Working on prior [employer] for samples

## 2013-11-16 NOTE — Telephone Encounter (Signed)
Patient to pick up samples today

## 2013-11-17 MED ORDER — DICYCLOMINE HCL 10 MG PO CAPS: 10.0000 mg | ORAL_CAPSULE | Freq: Three times a day (TID) | ORAL | Status: DC

## 2013-11-17 NOTE — Telephone Encounter (Signed)
Called pt to let him know I spoke with insurance and they are faxing me a form to fill out today

## 2013-11-17 NOTE — Telephone Encounter (Signed)
Medication approved

## 2014-01-10 ENCOUNTER — Telehealth: Payer: Self-pay | Admitting: Internal Medicine

## 2014-01-10 NOTE — Telephone Encounter (Signed)
called patient to make appointment x3. Sent letter 1/28

## 2014-02-06 ENCOUNTER — Other Ambulatory Visit: Payer: Medicare Other

## 2014-02-07 ENCOUNTER — Other Ambulatory Visit (INDEPENDENT_AMBULATORY_CARE_PROVIDER_SITE_OTHER): Payer: Commercial Managed Care - HMO

## 2014-02-07 DIAGNOSIS — Z0000 Encounter for general adult medical examination without abnormal findings: Secondary | ICD-10-CM

## 2014-02-07 LAB — CBC WITH DIFFERENTIAL/PLATELET
Basophils Absolute: 0.1 10*3/uL (ref 0.0–0.1)
Basophils Relative: 0.7 % (ref 0.0–3.0)
Eosinophils Absolute: 0.2 10*3/uL (ref 0.0–0.7)
Eosinophils Relative: 2.7 % (ref 0.0–5.0)
HCT: 47.1 % (ref 39.0–52.0)
Hemoglobin: 15.4 g/dL (ref 13.0–17.0)
Lymphocytes Relative: 30.9 % (ref 12.0–46.0)
Lymphs Abs: 2.6 10*3/uL (ref 0.7–4.0)
MCHC: 32.6 g/dL (ref 30.0–36.0)
MCV: 96.8 fl (ref 78.0–100.0)
Monocytes Absolute: 0.9 10*3/uL (ref 0.1–1.0)
Monocytes Relative: 10.6 % (ref 3.0–12.0)
Neutro Abs: 4.7 10*3/uL (ref 1.4–7.7)
Neutrophils Relative %: 55.1 % (ref 43.0–77.0)
Platelets: 260 10*3/uL (ref 150.0–400.0)
RBC: 4.87 Mil/uL (ref 4.22–5.81)
RDW: 13.5 % (ref 11.5–14.6)
WBC: 8.5 10*3/uL (ref 4.5–10.5)

## 2014-02-07 LAB — BASIC METABOLIC PANEL
BUN: 17 mg/dL (ref 6–23)
CO2: 30 mEq/L (ref 19–32)
Calcium: 9.4 mg/dL (ref 8.4–10.5)
Chloride: 100 mEq/L (ref 96–112)
Creatinine, Ser: 1 mg/dL (ref 0.4–1.5)
GFR: 81.16 mL/min (ref >60.00)
Glucose, Bld: 95 mg/dL (ref 70–99)
Potassium: 4.6 mEq/L (ref 3.5–5.1)
Sodium: 136 mEq/L (ref 135–145)

## 2014-02-07 LAB — HEPATIC FUNCTION PANEL
ALT: 13 U/L (ref 0–53)
AST: 23 U/L (ref 0–37)
Albumin: 4 g/dL (ref 3.5–5.2)
Alkaline Phosphatase: 80 U/L (ref 39–117)
Bilirubin, Direct: 0.1 mg/dL (ref 0.0–0.3)
Total Bilirubin: 0.7 mg/dL (ref 0.3–1.2)
Total Protein: 7.4 g/dL (ref 6.0–8.3)

## 2014-02-07 LAB — LIPID PANEL
Cholesterol: 190 mg/dL (ref 0–200)
HDL: 50.4 mg/dL (ref >39.00)
LDL Cholesterol: 121 mg/dL — ABNORMAL HIGH (ref 0–99)
Total CHOL/HDL Ratio: 4
Triglycerides: 93 mg/dL (ref 0.0–149.0)
VLDL: 18.6 mg/dL (ref 0.0–40.0)

## 2014-02-07 LAB — POCT URINALYSIS DIPSTICK
Bilirubin, UA: NEGATIVE
Blood, UA: NEGATIVE
Clarity, UA: NEGATIVE
Color, UA: NEGATIVE
Glucose, UA: NEGATIVE
Ketones, UA: NEGATIVE
Leukocytes, UA: NEGATIVE
Nitrite, UA: NEGATIVE
Protein, UA: NEGATIVE
Spec Grav, UA: 1.01
Urobilinogen, UA: 0.2
pH, UA: 6

## 2014-02-07 LAB — PSA: PSA: 0.81 ng/mL (ref 0.10–4.00)

## 2014-02-07 LAB — TSH: TSH: 1.91 u[IU]/mL (ref 0.35–5.50)

## 2014-02-12 ENCOUNTER — Encounter: Payer: Self-pay | Admitting: Family Medicine

## 2014-02-12 ENCOUNTER — Ambulatory Visit (INDEPENDENT_AMBULATORY_CARE_PROVIDER_SITE_OTHER): Payer: Commercial Managed Care - HMO | Admitting: Family Medicine

## 2014-02-12 VITALS — BP 120/80 | HR 76 | Temp 98.2°F | Ht 68.0 in | Wt 182.0 lb

## 2014-02-12 DIAGNOSIS — K219 Gastro-esophageal reflux disease without esophagitis: Secondary | ICD-10-CM

## 2014-02-12 DIAGNOSIS — Z0000 Encounter for general adult medical examination without abnormal findings: Secondary | ICD-10-CM

## 2014-02-12 DIAGNOSIS — G4700 Insomnia, unspecified: Secondary | ICD-10-CM

## 2014-02-12 MED ORDER — PANTOPRAZOLE SODIUM 40 MG PO TBEC: 40.0000 mg | DELAYED_RELEASE_TABLET | Freq: Two times a day (BID) | ORAL | Status: DC

## 2014-02-12 MED ORDER — ZOLPIDEM TARTRATE 5 MG PO TABS: 5.0000 mg | ORAL_TABLET | Freq: Every evening | ORAL | Status: DC | PRN

## 2014-02-12 NOTE — Progress Notes (Signed)
   Subjective:    Patient ID: Tanner Bishop, male    DOB: 23-Apr-1946, 68 y.o.   MRN: [id number]  HPI 68 yr old male for a cpx. He is doing well. His IPF seems to be stable. He still walks 5 miles about 6 days a week.    Review of Systems  Constitutional: Negative.   HENT: Negative.   Eyes: Negative.   Respiratory: Negative.   Cardiovascular: Negative.   Gastrointestinal: Negative.   Genitourinary: Negative.   Musculoskeletal: Negative.   Skin: Negative.   Neurological: Negative.   Psychiatric/Behavioral: Negative.        Objective:   Physical Exam  Constitutional: He is oriented to person, place, and time. He appears well-developed and well-nourished. No distress.  HENT:  Head: Normocephalic and atraumatic.  Right Ear: External ear normal.  Left Ear: External ear normal.  Nose: Nose normal.  Mouth/Throat: Oropharynx is clear and moist. No oropharyngeal exudate.  Eyes: Conjunctivae and EOM are normal. Pupils are equal, round, and reactive to light. Right eye exhibits no discharge. Left eye exhibits no discharge. No scleral icterus.  Neck: Neck supple. No JVD present. No tracheal deviation present. No thyromegaly present.  Cardiovascular: Normal rate, regular rhythm, normal heart sounds and intact distal pulses.  Exam reveals no gallop and no friction rub.   No murmur heard. Pulmonary/Chest: Effort normal and breath sounds normal. No respiratory distress. He has no wheezes. He has no rales. He exhibits no tenderness.  Abdominal: Soft. Bowel sounds are normal. He exhibits no distension and no mass. There is no tenderness. There is no rebound and no guarding.  Genitourinary: Rectum normal, prostate normal and penis normal. Guaiac negative stool. No penile tenderness.  Musculoskeletal: Normal range of motion. He exhibits no edema and no tenderness.  Lymphadenopathy:    He has no cervical adenopathy.  Neurological: He is alert and oriented to person, place, and time. He has normal  reflexes. No cranial nerve deficit. He exhibits normal muscle tone. Coordination normal.  Skin: Skin is warm and dry. No rash noted. He is not diaphoretic. No erythema. No pallor.  Psychiatric: He has a normal mood and affect. His behavior is normal. Judgment and thought content normal.          Assessment & Plan:  Well exam.

## 2014-02-12 NOTE — Progress Notes (Signed)
Pre visit review using our clinic review tool, if applicable. No additional management support is needed unless otherwise documented below in the visit note.

## 2014-02-13 ENCOUNTER — Telehealth: Payer: Self-pay | Admitting: Family Medicine

## 2014-02-13 NOTE — Telephone Encounter (Signed)
Pt called insurance company about shingles vac. Pt needs rx for the vacc in order for them to pay. Pt prefers to PU the rx (his grandaughter works at [employer])

## 2014-02-14 NOTE — Telephone Encounter (Signed)
Script is ready for pick up and I spoke with pt.

## 2014-02-14 NOTE — Telephone Encounter (Signed)
The rx is ready

## 2014-03-02 ENCOUNTER — Other Ambulatory Visit: Payer: Self-pay | Admitting: Family Medicine

## 2014-03-12 ENCOUNTER — Telehealth: Payer: Self-pay | Admitting: Family Medicine

## 2014-03-12 NOTE — Telephone Encounter (Signed)
Pt in needing a referral for his pulmonary doctor. Dr. Shary Key, pt states insurance changed and they are requiring a referral.

## 2014-03-13 NOTE — Telephone Encounter (Signed)
Pt now has [employer] and we just need to fill out another form for him. He is already established with the below doctor.

## 2014-03-13 NOTE — Telephone Encounter (Signed)
Referral information faxed to [facility]

## 2014-04-04 ENCOUNTER — Other Ambulatory Visit (INDEPENDENT_AMBULATORY_CARE_PROVIDER_SITE_OTHER): Payer: Commercial Managed Care - HMO

## 2014-04-04 ENCOUNTER — Encounter: Payer: Self-pay | Admitting: Internal Medicine

## 2014-04-04 ENCOUNTER — Ambulatory Visit (INDEPENDENT_AMBULATORY_CARE_PROVIDER_SITE_OTHER): Payer: Commercial Managed Care - HMO | Admitting: Internal Medicine

## 2014-04-04 DIAGNOSIS — J84112 Idiopathic pulmonary fibrosis: Secondary | ICD-10-CM

## 2014-04-04 LAB — HEPATIC FUNCTION PANEL
ALT: 19 U/L (ref 0–53)
AST: 27 U/L (ref 0–37)
Albumin: 4.1 g/dL (ref 3.5–5.2)
Alkaline Phosphatase: 67 U/L (ref 39–117)
Bilirubin, Direct: 0.2 mg/dL (ref 0.0–0.3)
Total Bilirubin: 0.7 mg/dL (ref 0.3–1.2)
Total Protein: 7.8 g/dL (ref 6.0–8.3)

## 2014-04-04 LAB — BASIC METABOLIC PANEL
BUN: 19 mg/dL (ref 6–23)
CO2: 29 mEq/L (ref 19–32)
Calcium: 9.5 mg/dL (ref 8.4–10.5)
Chloride: 100 mEq/L (ref 96–112)
Creatinine, Ser: 1.1 mg/dL (ref 0.4–1.5)
GFR: 71 mL/min (ref >60.00)
Glucose, Bld: 90 mg/dL (ref 70–99)
Potassium: 4 mEq/L (ref 3.5–5.1)
Sodium: 136 mEq/L (ref 135–145)

## 2014-04-04 LAB — CBC
HCT: 46.5 % (ref 39.0–52.0)
Hemoglobin: 15.6 g/dL (ref 13.0–17.0)
MCHC: 33.6 g/dL (ref 30.0–36.0)
MCV: 94.9 fl (ref 78.0–100.0)
Platelets: 275 10*3/uL (ref 150.0–400.0)
RBC: 4.9 Mil/uL (ref 4.22–5.81)
RDW: 13.7 % (ref 11.5–14.6)
WBC: 12.4 10*3/uL — ABNORMAL HIGH (ref 4.5–10.5)

## 2014-04-04 MED ORDER — FLUTICASONE PROPIONATE 50 MCG/ACT NA SUSP: 2.0000 | Freq: Every day | NASAL | Status: DC

## 2014-04-04 NOTE — Progress Notes (Signed)
Subjective:    Patient ID: Tanner Bishop, male    DOB: 03-May-1946, 68 y.o.   MRN: [id number]  HPI    OV 09/27/2013 - IPF followup. LASt seen 07/12/13  - Since last visit he underwent an endoscopy that was normal. He continued to have severe indigestion, acid reflux, abdominal pain, cramping and fatigue with study drug  Pirfenidoen. So on 09/02/2013 he stopped it and after this all symptoms of indigestion acid reflux, brown pain and fatigue have resolved. He says he feels great and is back at baseline. He absolutely does not want to take study drug anymore. His maintaining his weight and attending pulmonary rehabilitation. He has not but we'll have his flu shot today. He is unaware that lung transplant as an option for IPF we discussed this. Of note, skin rash is resolved  .#Indigestion  - was related to pirfenidone - glad resolved with you off pirfenidone  #skin rash on face - resolved with dc pirfenidone and sun screen  #IPF  - clinically stable  - have flu shot today - we discussed transplant of lungs and other drug nimatinib; agreed you will think about it   Continue pulmonary rehab, acid reflux therapy and weight  - No pirfenidone Rx due to side effect proifile - Losing around 2 pounds can be helpful  #FU  3 months with PFT   OV 04/04/2014  Chief Complaint  Patient presents with  . Follow-up    for IPF. Pt states he has been having an increase in dry cough x 1 month.    Followup idiopathic pulmonary fibrosis. He returns after 6 months. At last visit he was intolerant to Pirfenidone and because of his asymptomatic state he refuse to switch to ofev . Now for the last 1 month is reporting insidious onset of cough and shortness of breath/fatigue/chest tightness. He still is able to do his treadmill exercises without a problem but at the end does feel symptomatic. Cough is present day and night. He mows the yard and the pollen does not seem to make cough worse. He and his wife  are concerned that symptoms are due to pulmonary fibrosis. He is now very receptive to starting preventative treatment with OFEV. He did inquire about the side effects of OFEV. He also wants to use albuterol prn  Discussed OFEV and explained  Both drugs OFEV and Esbriet only slow down progression, 1 out of 6 patients  - this means extension in quality of life but no difference in symptoms    - OFEV  - twice daily, no titration,   - no need for sunscreen but high chance of diarrhea and need lomotil , slight increase in heart attack risk and theoretical increase in bleeding risk,   - need monthly blood work for 3 months and then every 6 months  - time to firse exacerbation possibly reduced in one trial but not in another - limited worldwide experience   - ESBRIET  - he did not tolerate  Review of Systems  Constitutional: Negative for fever and unexpected weight change.  HENT: Negative for congestion, dental problem, ear pain, nosebleeds, postnasal drip, rhinorrhea, sinus pressure, sneezing, sore throat and trouble swallowing.   Eyes: Negative for redness and itching.  Respiratory: Positive for cough. Negative for chest tightness, shortness of breath and wheezing.   Cardiovascular: Negative for palpitations and leg swelling.  Gastrointestinal: Negative for nausea and vomiting.  Genitourinary: Negative for dysuria.  Musculoskeletal: Negative for joint swelling.  Skin: Negative  for rash.  Neurological: Negative for headaches.  Hematological: Does not bruise/bleed easily.  Psychiatric/Behavioral: Negative for dysphoric mood. The patient is not nervous/anxious.        Objective:   Physical Exam  Nursing note and vitals reviewed. Constitutional: He is oriented to person, place, and time. He appears well-developed and well-nourished. No distress.  HENT:  Head: Normocephalic and atraumatic.  Right Ear: External ear normal.  Left Ear: External ear normal.  Mouth/Throat: Oropharynx is  clear and moist. No oropharyngeal exudate.  Eyes: Conjunctivae and EOM are normal. Pupils are equal, round, and reactive to light. Right eye exhibits no discharge. Left eye exhibits no discharge. No scleral icterus.  Neck: Normal range of motion. Neck supple. No JVD present. No tracheal deviation present. No thyromegaly present.  Cardiovascular: Normal rate, regular rhythm and intact distal pulses.  Exam reveals no gallop and no friction rub.   No murmur heard. Pulmonary/Chest: Effort normal. No respiratory distress. He has no wheezes. He has rales. He exhibits no tenderness.  Abdominal: Soft. Bowel sounds are normal. He exhibits no distension and no mass. There is no tenderness. There is no rebound and no guarding.  Musculoskeletal: Normal range of motion. He exhibits no edema and no tenderness.  Lymphadenopathy:    He has no cervical adenopathy.  Neurological: He is alert and oriented to person, place, and time. He has normal reflexes. No cranial nerve deficit. Coordination normal.  Skin: Skin is warm and dry. No rash noted. He is not diaphoretic. No erythema. No pallor.  Psychiatric: He has a normal mood and affect. His behavior is normal. Judgment and thought content normal.          Assessment & Plan:

## 2014-04-04 NOTE — Patient Instructions (Signed)
#  IPF Likely worse; you need treatment to prevent it from getting worse Check LFT and CBC and bMET blood test today Start Ofev 185m po twice daily; [place]  - call if side effects If diarrhea, start   Diphenoxylate 5 mg 4 times/day as needed Repeat LFT in 1 month Do PFT and walk test in 1 month Continue acid reflux treament Start  take generic fluticasone inhaler 2 squirts each nostril daily Use albuterol 2 puff prn - learn technique from my CMA   #Followup Repeat LFT in 5 weeks Do full PFT in 5 weeks Do walk test in 5 weeks REturn to see me or my NP Tammy in 5 weeks

## 2014-04-06 NOTE — Assessment & Plan Note (Addendum)
Clinically progressive and now symptomatic. No lung function available this visit  PLAN Discussed OFEV and explained  Both drugs OFEV and Esbriet only slow down progression, 1 out of 6 patients  - this means extension in quality of life but no difference in symptoms    - OFEV  - twice daily, no titration,   - no need for sunscreen but high chance of diarrhea and need lomotil , slight increase in heart attack risk and theoretical increase in bleeding risk,   - need monthly blood work for 3 months and then every 6 months  - time to firse exacerbation possibly reduced in one trial but not in another - limited worldwide experience compared to esbriet   - ESBRIET  - he did not tolerate   PLAN  #IPF Likely worse; you need treatment to prevent it from getting worse Check LFT and CBC and bMET blood test today Start Ofev 154m po twice daily; [place]  - call if side effects If diarrhea, start   Diphenoxylate 5 mg 4 times/day as needed Repeat LFT in 1 month Do PFT and walk test in 1 month Continue acid reflux treament Start  take generic fluticasone inhaler 2 squirts each nostril daily Use albuterol 2 puff prn - learn technique from my CMA   #Followup Repeat LFT in 5 weeks Do full PFT in 5 weeks Do walk test in 5 weeks REturn to see me or my NP Tammy in 5 weeks

## 2014-04-11 ENCOUNTER — Telehealth: Payer: Self-pay | Admitting: Internal Medicine

## 2014-04-11 NOTE — Telephone Encounter (Signed)
Called the pharmacy back and they stated that the ofev needs a PA done.  They are faxing the papers to the fax machine up front.  Will wait for these papers.

## 2014-04-13 NOTE — Telephone Encounter (Signed)
Fas has been received and placed in MR look at for signature. Will forward to [place] to follow up on. thanks

## 2014-04-16 NOTE — Telephone Encounter (Signed)
Signed and given back to Gerline Legacy 1:58 PM 04/16/2014    Dr. Brand Males, M.D., [facility].C.P Pulmonary and Critical Care Medicine Staff Physician [facility] Pager: [phone], If no answer or between  15:00h - 7:00h: call [phone]  04/16/2014 1:58 PM

## 2014-04-17 NOTE — Telephone Encounter (Signed)
Faxed on 04-16-14. I have placed a copy in green misc folder at desk to follow-up on approval/denial. Farrell Bing, CMA

## 2014-04-20 NOTE — Telephone Encounter (Signed)
Received fax from [place]. This was approved until 12/13/14.  This has been placed in MR folder to be signed for scan.  I spoke with Sissy Hoff from # above. Made aware of approval.

## 2014-05-14 ENCOUNTER — Ambulatory Visit: Payer: Commercial Managed Care - HMO | Admitting: Internal Medicine

## 2014-05-14 ENCOUNTER — Ambulatory Visit: Payer: Commercial Managed Care - HMO

## 2014-05-14 ENCOUNTER — Other Ambulatory Visit (INDEPENDENT_AMBULATORY_CARE_PROVIDER_SITE_OTHER): Payer: Commercial Managed Care - HMO

## 2014-05-14 ENCOUNTER — Encounter: Payer: Self-pay | Admitting: Internal Medicine

## 2014-05-14 VITALS — BP 126/84 | HR 72 | Ht 67.0 in | Wt 185.0 lb

## 2014-05-14 DIAGNOSIS — J84112 Idiopathic pulmonary fibrosis: Secondary | ICD-10-CM | POA: Insufficient documentation

## 2014-05-14 LAB — CBC WITH DIFFERENTIAL/PLATELET
Basophils Absolute: 0 10*3/uL (ref 0.0–0.1)
Basophils Relative: 0.3 % (ref 0.0–3.0)
Eosinophils Absolute: 0.2 10*3/uL (ref 0.0–0.7)
Eosinophils Relative: 1.6 % (ref 0.0–5.0)
HCT: 46.5 % (ref 39.0–52.0)
Hemoglobin: 15.6 g/dL (ref 13.0–17.0)
Lymphocytes Relative: 26.7 % (ref 12.0–46.0)
Lymphs Abs: 2.9 10*3/uL (ref 0.7–4.0)
MCHC: 33.6 g/dL (ref 30.0–36.0)
MCV: 94.3 fl (ref 78.0–100.0)
Monocytes Absolute: 1 10*3/uL (ref 0.1–1.0)
Monocytes Relative: 9.4 % (ref 3.0–12.0)
Neutro Abs: 6.8 10*3/uL (ref 1.4–7.7)
Neutrophils Relative %: 62 % (ref 43.0–77.0)
Platelets: 263 10*3/uL (ref 150.0–400.0)
RBC: 4.93 Mil/uL (ref 4.22–5.81)
RDW: 13.6 % (ref 11.5–15.5)
WBC: 10.9 10*3/uL — ABNORMAL HIGH (ref 4.0–10.5)

## 2014-05-14 LAB — PULMONARY FUNCTION TEST
DL/VA % pred: 120 %
DL/VA: 5.32 ml/min/mmHg/L
DLCO unc % pred: 71 %
DLCO unc: 20.24 ml/min/mmHg
FEF 25-75 Post: 3.97 L/sec
FEF 25-75 Pre: 3.31 L/sec
FEF2575-%Change-Post: 19 %
FEF2575-%Pred-Post: 169 %
FEF2575-%Pred-Pre: 140 %
FEV1-%Change-Post: 3 %
FEV1-%Pred-Post: 80 %
FEV1-%Pred-Pre: 78 %
FEV1-Post: 2.41 L
FEV1-Pre: 2.34 L
FEV1FVC-%Change-Post: 4 %
FEV1FVC-%Pred-Pre: 116 %
FEV6-%Change-Post: -1 %
FEV6-%Pred-Post: 69 %
FEV6-%Pred-Pre: 70 %
FEV6-Post: 2.66 L
FEV6-Pre: 2.7 L
FEV6FVC-%Change-Post: 0 %
FEV6FVC-%Pred-Post: 106 %
FEV6FVC-%Pred-Pre: 105 %
FVC-%Change-Post: -1 %
FVC-%Pred-Post: 65 %
FVC-%Pred-Pre: 67 %
FVC-Post: 2.66 L
FVC-Pre: 2.71 L
Post FEV1/FVC ratio: 91 %
Post FEV6/FVC ratio: 100 %
Pre FEV1/FVC ratio: 86 %
Pre FEV6/FVC Ratio: 100 %
RV % pred: 43 %
RV: 0.96 L
TLC % pred: 57 %
TLC: 3.68 L

## 2014-05-14 LAB — BASIC METABOLIC PANEL
BUN: 14 mg/dL (ref 6–23)
CO2: 30 mEq/L (ref 19–32)
Calcium: 9.5 mg/dL (ref 8.4–10.5)
Chloride: 101 mEq/L (ref 96–112)
Creatinine, Ser: 1.1 mg/dL (ref 0.4–1.5)
GFR: 68.11 mL/min (ref >60.00)
Glucose, Bld: 96 mg/dL (ref 70–99)
Potassium: 4.6 mEq/L (ref 3.5–5.1)
Sodium: 139 mEq/L (ref 135–145)

## 2014-05-14 LAB — HEPATIC FUNCTION PANEL
ALT: 16 U/L (ref 0–53)
AST: 27 U/L (ref 0–37)
Albumin: 4.2 g/dL (ref 3.5–5.2)
Alkaline Phosphatase: 55 U/L (ref 39–117)
Bilirubin, Direct: 0.1 mg/dL (ref 0.0–0.3)
Total Bilirubin: 0.6 mg/dL (ref 0.2–1.2)
Total Protein: 7.7 g/dL (ref 6.0–8.3)

## 2014-05-14 NOTE — Progress Notes (Signed)
Subjective:    Patient ID: Tanner Bishop, male    DOB: Jul 08, 1946, 68 y.o.   MRN: [id number]  HPI  OV 09/27/2013 - IPF followup. LASt seen 07/12/13  - Since last visit he underwent an endoscopy that was normal. He continued to have severe indigestion, acid reflux, abdominal pain, cramping and fatigue with study drug  Pirfenidoen. So on 09/02/2013 he stopped it and after this all symptoms of indigestion acid reflux, brown pain and fatigue have resolved. He says he feels great and is back at baseline. He absolutely does not want to take study drug anymore. His maintaining his weight and attending pulmonary rehabilitation. He has not but we'll have his flu shot today. He is unaware that lung transplant as an option for IPF we discussed this. Of note, skin rash is resolved  .#Indigestion  - was related to pirfenidone - glad resolved with you off pirfenidone  #skin rash on face - resolved with dc pirfenidone and sun screen  #IPF  - clinically stable  - have flu shot today - we discussed transplant of lungs and other drug nimatinib; agreed you will think about it   Continue pulmonary rehab, acid reflux therapy and weight  - No pirfenidone Rx due to side effect proifile - Losing around 2 pounds can be helpful  #FU  3 months with PFT   OV 04/04/2014  Chief Complaint  Patient presents with  . Follow-up    for IPF. Pt states he has been having an increase in dry cough x 1 month.    Followup idiopathic pulmonary fibrosis. He returns after 6 months. At last visit he was intolerant to Pirfenidone and because of his asymptomatic state he refuse to switch to ofev . Now for the last 1 month is reporting insidious onset of cough and shortness of breath/fatigue/chest tightness. He still is able to do his treadmill exercises without a problem but at the end does feel symptomatic. Cough is present day and night. He mows the yard and the pollen does not seem to make cough worse. He and his wife are  concerned that symptoms are due to pulmonary fibrosis. He is now very receptive to starting preventative treatment with OFEV. He did inquire about the side effects of OFEV. He also wants to use albuterol prn  Discussed OFEV and explained  Both drugs OFEV and Esbriet only slow down progression, 1 out of 6 patients  - this means extension in quality of life but no difference in symptoms    - OFEV  - twice daily, no titration,   - no need for sunscreen but high chance of diarrhea and need lomotil , slight increase in heart attack risk and theoretical increase in bleeding risk,   - need monthly blood work for 3 months and then every 6 months  - time to firse exacerbation possibly reduced in one trial but not in another - limited worldwide experience   - ESBRIET  - he did not tolerate  #IPF Likely worse; you need treatment to prevent it from getting worse Check LFT and CBC and bMET blood test today Start Ofev 176m po twice daily; [facility] [place]  - call if side effects If diarrhea, start   Diphenoxylate 5 mg 4 times/day as needed Repeat LFT in 1 month Do PFT and walk test in 1 month Continue acid reflux treament Start  take generic fluticasone inhaler 2 squirts each nostril daily Use albuterol 2 puff prn - learn technique from my  CMA   #Followup Repeat LFT in 5 weeks Do full PFT in 5 weeks Do walk test in 5 weeks REturn to see me or my NP Tammy in 5 weeks   OV 05/14/2014  Chief Complaint  Patient presents with  . Follow-up    Pt states breathing is unchanged. C/o SOB in the evening, dry cough. Denies wheeizng and CP.     Followup idiopathic pulmonary fibrosis. He completed one month of OFEV treatment. Denies any side effects other than fatigue. Overall shortness of breath and cough is the same at mild to moderate level. Over the next month he plans to go to the [place] in [place] which is at a height higher than actual. We discussed transplant issues. He says  that he has thought about it but has made no conclusions. He is worried that having being 68 years of age transplant might increase his mortality rate (Body mass index is 28.97 kg/(m^2). and is slightly high for transplant)    Pulmonary function test 05/14/2014: FVC 2.7 L or 67%. FEV1 2.3 the/70%. Ratio of 86/116%. Total lung capacity 2.7 L or 57%. The is 20.2/71%. Do not know how it compares to before; cannot find past ones. OVerll c/w Moderate COPD  Walking desaturation to starting in her feet x3 labs on room at in the office: 05/14/2014: Did not desaturate. Heart rate stayed below 85/min   Review of Systems  Constitutional: Negative for fever and unexpected weight change.  HENT: Negative for congestion, dental problem, ear pain, nosebleeds, postnasal drip, rhinorrhea, sinus pressure, sneezing, sore throat and trouble swallowing.   Eyes: Negative for redness and itching.  Respiratory: Positive for shortness of breath. Negative for cough, chest tightness and wheezing.   Cardiovascular: Negative for palpitations and leg swelling.  Gastrointestinal: Negative for nausea and vomiting.  Genitourinary: Negative for dysuria.  Musculoskeletal: Negative for joint swelling.  Skin: Negative for rash.  Neurological: Negative for headaches.  Hematological: Does not bruise/bleed easily.  Psychiatric/Behavioral: Negative for dysphoric mood. The patient is not nervous/anxious.        Objective:   Physical Exam  Nursing note and vitals reviewed. Constitutional: He is oriented to person, place, and time. He appears well-developed and well-nourished. No distress.  HENT:  Head: Normocephalic and atraumatic.  Right Ear: External ear normal.  Left Ear: External ear normal.  Mouth/Throat: Oropharynx is clear and moist. No oropharyngeal exudate.  Eyes: Conjunctivae and EOM are normal. Pupils are equal, round, and reactive to light. Right eye exhibits no discharge. Left eye exhibits no discharge. No  scleral icterus.  Neck: Normal range of motion. Neck supple. No JVD present. No tracheal deviation present. No thyromegaly present.  Cardiovascular: Normal rate, regular rhythm and intact distal pulses.  Exam reveals no gallop and no friction rub.   No murmur heard. Pulmonary/Chest: Effort normal. No respiratory distress. He has no wheezes. He has rales. He exhibits no tenderness.  Basal crackles  Abdominal: Soft. Bowel sounds are normal. He exhibits no distension and no mass. There is no tenderness. There is no rebound and no guarding.  Musculoskeletal: Normal range of motion. He exhibits no edema and no tenderness.  Lymphadenopathy:    He has no cervical adenopathy.  Neurological: He is alert and oriented to person, place, and time. He has normal reflexes. No cranial nerve deficit. Coordination normal.  Skin: Skin is warm and dry. No rash noted. He is not diaphoretic. No erythema. No pallor.  Psychiatric: He has a normal mood and affect. His  behavior is normal. Judgment and thought content normal.    Filed Vitals:   05/14/14 1632  BP: 126/84  Pulse: 72  Height: 5' 7" (1.702 m)  Weight: 185 lb (83.915 kg)  SpO2: 96%         Assessment & Plan:   #IPF CLinically stable compared to last visit Glad you are tolerating OFEV well Fatigue could be due to OFEVheck LFT and CBC and bMET blood test today Continue  Ofev 145m po twice daily; [place]  - call if side effects If diarrhea, start   Diphenoxylate 5 mg 4 times/day as needed Repeat CBC, BMET and LFT today in 1 month Continue acid reflux treament Continue  take generic fluticasone inhaler 2 squirts each nostril daily Use albuterol 2 puff prn - learn technique from my [place] of [place] Think about the transplant issues we discussed   #Followup Repeat LFT, BMET and CBC  in 4 weeks Do spirometry with CMA in office (not Tammy Wilson) in 3 monts Do walk test in office (not  TJune Leap  in 3 months REturn to see me  In 3 months

## 2014-05-14 NOTE — Progress Notes (Signed)
PFT done today. 

## 2014-05-14 NOTE — Patient Instructions (Addendum)
#  IPF CLinically stable compared to last visit Glad you are tolerating OFEV well Fatigue could be due to OFEVheck LFT and CBC and bMET blood test today Continue  Ofev 131m po twice daily; through [place]  - call if side effects If diarrhea, start   Diphenoxylate 5 mg 4 times/day as needed Repeat CBC, BMET and LFT today in 1 month Continue acid reflux treament Continue  take generic fluticasone inhaler 2 squirts each nostril daily Use albuterol 2 puff prn - learn technique from my [place]part[place]OK [place] expect shortness of breath Think about the transplant issues we discussed   #Followup Repeat LFT, BMET and CBC  in 4 weeks Do spirometry with CMA in office (not TJune Leap in 3 monts Do walk test in office (not TJune Leap  in 3 months REturn to see me  In 3 months

## 2014-05-25 NOTE — Assessment & Plan Note (Signed)
#  IPF CLinically stable compared to last visit Glad you are tolerating OFEV well Fatigue could be due to OFEVheck LFT and CBC and bMET blood test today Continue  Ofev 150mg po twice daily; through [facility]  - call if side effects If diarrhea, start   Diphenoxylate 5 mg 4 times/day as needed Repeat CBC, BMET and LFT today in 1 month Continue acid reflux treament Continue  take generic fluticasone inhaler 2 squirts each nostril daily Use albuterol 2 puff prn - learn technique from my CMA OK to go [place] mountains but expect shortness of breath Think about the transplant issues we discussed   #Followup Repeat LFT, BMET and CBC  in 4 weeks Do spirometry with CMA in office (not Tammy Wilson) in 3 monts Do walk test in office (not Tammy Wilson)  in 3 months REturn to see me  In 3 months   

## 2014-06-10 ENCOUNTER — Emergency Department (HOSPITAL_COMMUNITY): Payer: Medicare HMO

## 2014-06-10 ENCOUNTER — Emergency Department (HOSPITAL_COMMUNITY)
Admission: EM | Admit: 2014-06-10 | Discharge: 2014-06-10 | Disposition: A | Discharge status: Home / Self Care | Payer: Medicare HMO | Attending: Emergency Medicine | Admitting: Emergency Medicine

## 2014-06-10 ENCOUNTER — Encounter (HOSPITAL_COMMUNITY): Payer: Self-pay | Admitting: Emergency Medicine

## 2014-06-10 DIAGNOSIS — K219 Gastro-esophageal reflux disease without esophagitis: Secondary | ICD-10-CM | POA: Insufficient documentation

## 2014-06-10 DIAGNOSIS — Z79899 Other long term (current) drug therapy: Secondary | ICD-10-CM | POA: Insufficient documentation

## 2014-06-10 DIAGNOSIS — Z87828 Personal history of other (healed) physical injury and trauma: Secondary | ICD-10-CM | POA: Insufficient documentation

## 2014-06-10 DIAGNOSIS — Z8739 Personal history of other diseases of the musculoskeletal system and connective tissue: Secondary | ICD-10-CM | POA: Insufficient documentation

## 2014-06-10 DIAGNOSIS — R0682 Tachypnea, not elsewhere classified: Secondary | ICD-10-CM | POA: Insufficient documentation

## 2014-06-10 DIAGNOSIS — Z8709 Personal history of other diseases of the respiratory system: Secondary | ICD-10-CM | POA: Insufficient documentation

## 2014-06-10 DIAGNOSIS — N200 Calculus of kidney: Secondary | ICD-10-CM | POA: Insufficient documentation

## 2014-06-10 DIAGNOSIS — Z87891 Personal history of nicotine dependence: Secondary | ICD-10-CM | POA: Insufficient documentation

## 2014-06-10 DIAGNOSIS — R112 Nausea with vomiting, unspecified: Secondary | ICD-10-CM | POA: Insufficient documentation

## 2014-06-10 LAB — CBC WITH DIFFERENTIAL/PLATELET
Basophils Absolute: 0.1 10*3/uL (ref 0.0–0.1)
Basophils Relative: 1 % (ref 0–1)
Eosinophils Absolute: 0.1 10*3/uL (ref 0.0–0.7)
Eosinophils Relative: 1 % (ref 0–5)
HCT: 45.1 % (ref 39.0–52.0)
Hemoglobin: 15.4 g/dL (ref 13.0–17.0)
Lymphocytes Relative: 38 % (ref 12–46)
Lymphs Abs: 3.5 10*3/uL (ref 0.7–4.0)
MCH: 31.9 pg (ref 26.0–34.0)
MCHC: 34.1 g/dL (ref 30.0–36.0)
MCV: 93.4 fL (ref 78.0–100.0)
Monocytes Absolute: 0.8 10*3/uL (ref 0.1–1.0)
Monocytes Relative: 8 % (ref 3–12)
Neutro Abs: 4.8 10*3/uL (ref 1.7–7.7)
Neutrophils Relative %: 52 % (ref 43–77)
Platelets: 284 10*3/uL (ref 150–400)
RBC: 4.83 MIL/uL (ref 4.22–5.81)
RDW: 12.9 % (ref 11.5–15.5)
WBC: 9.2 10*3/uL (ref 4.0–10.5)

## 2014-06-10 LAB — LIPASE, BLOOD: Lipase: 22 U/L (ref 11–59)

## 2014-06-10 LAB — URINALYSIS, ROUTINE W REFLEX MICROSCOPIC
Bilirubin Urine: NEGATIVE
Glucose, UA: NEGATIVE mg/dL
Ketones, ur: 15 mg/dL — AB
Nitrite: NEGATIVE
Protein, ur: 30 mg/dL — AB
Specific Gravity, Urine: 1.023 (ref 1.005–1.030)
Urobilinogen, UA: 1 mg/dL (ref 0.0–1.0)
pH: 7.5 (ref 5.0–8.0)

## 2014-06-10 LAB — COMPREHENSIVE METABOLIC PANEL
ALT: 15 U/L (ref 0–53)
AST: 25 U/L (ref 0–37)
Albumin: 4 g/dL (ref 3.5–5.2)
Alkaline Phosphatase: 55 U/L (ref 39–117)
BUN: 14 mg/dL (ref 6–23)
CO2: 21 mEq/L (ref 19–32)
Calcium: 9.3 mg/dL (ref 8.4–10.5)
Chloride: 98 mEq/L (ref 96–112)
Creatinine, Ser: 1.13 mg/dL (ref 0.50–1.35)
GFR calc Af Amer: 76 mL/min — ABNORMAL LOW (ref >90)
GFR calc non Af Amer: 65 mL/min — ABNORMAL LOW (ref >90)
Glucose, Bld: 116 mg/dL — ABNORMAL HIGH (ref 70–99)
Potassium: 3.6 mEq/L — ABNORMAL LOW (ref 3.7–5.3)
Sodium: 139 mEq/L (ref 137–147)
Total Bilirubin: 0.4 mg/dL (ref 0.3–1.2)
Total Protein: 7.7 g/dL (ref 6.0–8.3)

## 2014-06-10 LAB — URINE MICROSCOPIC-ADD ON

## 2014-06-10 MED ORDER — TAMSULOSIN HCL 0.4 MG PO CAPS: 0.4000 mg | ORAL_CAPSULE | Freq: Every day | ORAL | Status: DC

## 2014-06-10 MED ORDER — MORPHINE SULFATE 4 MG/ML IJ SOLN
4.0000 mg | Freq: Once | INTRAMUSCULAR | Status: AC
  Administered 2014-06-10: 4 mg via INTRAVENOUS
  Filled 2014-06-10: qty 1

## 2014-06-10 MED ORDER — HYDROMORPHONE HCL PF 1 MG/ML IJ SOLN
0.5000 mg | INTRAMUSCULAR | Status: DC | PRN
Start: 2014-06-10 — End: 2014-06-10
  Administered 2014-06-10: 0.5 mg via INTRAVENOUS
  Filled 2014-06-10: qty 1

## 2014-06-10 MED ORDER — HYDROMORPHONE HCL PF 1 MG/ML IJ SOLN
1.0000 mg | INTRAMUSCULAR | Status: DC | PRN
  Administered 2014-06-10 (×2): 1 mg via INTRAVENOUS
  Filled 2014-06-10 (×2): qty 1

## 2014-06-10 MED ORDER — FENTANYL CITRATE 0.05 MG/ML IJ SOLN
50.0000 ug | Freq: Once | INTRAMUSCULAR | Status: AC
  Administered 2014-06-10: 50 ug via INTRAVENOUS
  Filled 2014-06-10: qty 2

## 2014-06-10 MED ORDER — ONDANSETRON HCL 4 MG PO TABS: 4.0000 mg | ORAL_TABLET | Freq: Four times a day (QID) | ORAL | Status: DC

## 2014-06-10 MED ORDER — ONDANSETRON HCL 4 MG/2ML IJ SOLN
4.0000 mg | Freq: Once | INTRAMUSCULAR | Status: AC
  Administered 2014-06-10: 4 mg via INTRAVENOUS
  Filled 2014-06-10: qty 2

## 2014-06-10 MED ORDER — TAMSULOSIN HCL 0.4 MG PO CAPS
0.4000 mg | ORAL_CAPSULE | Freq: Every day | ORAL | Status: DC
  Administered 2014-06-10: 0.4 mg via ORAL
  Filled 2014-06-10: qty 1

## 2014-06-10 MED ORDER — SODIUM CHLORIDE 0.9 % IV BOLUS (SEPSIS)
1000.0000 mL | Freq: Once | INTRAVENOUS | Status: AC
  Administered 2014-06-10: 1000 mL via INTRAVENOUS

## 2014-06-10 MED ORDER — KETOROLAC TROMETHAMINE 30 MG/ML IJ SOLN
30.0000 mg | Freq: Once | INTRAMUSCULAR | Status: AC
  Administered 2014-06-10: 30 mg via INTRAVENOUS
  Filled 2014-06-10: qty 1

## 2014-06-10 MED ORDER — ONDANSETRON 4 MG PO TBDP
4.0000 mg | ORAL_TABLET | Freq: Once | ORAL | Status: AC
  Administered 2014-06-10: 4 mg via ORAL
  Filled 2014-06-10: qty 1

## 2014-06-10 MED ORDER — OXYCODONE-ACETAMINOPHEN 5-325 MG PO TABS: 1.0000 | ORAL_TABLET | Freq: Four times a day (QID) | ORAL | Status: DC | PRN

## 2014-06-10 NOTE — Discharge Instructions (Signed)
Kidney Stones Kidney stones (urolithiasis) are deposits that form inside your kidneys. The intense pain is caused by the stone moving through the urinary tract. When the stone moves, the ureter goes into spasm around the stone. The stone is usually passed in the urine.  CAUSES   A disorder that makes certain neck glands produce too much parathyroid hormone (primary hyperparathyroidism).  A buildup of uric acid crystals, similar to gout in your joints.  Narrowing (stricture) of the ureter.  A kidney obstruction present at birth (congenital obstruction).  Previous surgery on the kidney or ureters.  Numerous kidney infections. SYMPTOMS   Feeling sick to your stomach (nauseous).  Throwing up (vomiting).  Blood in the urine (hematuria).  Pain that usually spreads (radiates) to the groin.  Frequency or urgency of urination. DIAGNOSIS   Taking a history and physical exam.  Blood or urine tests.  CT scan.  Occasionally, an examination of the inside of the urinary bladder (cystoscopy) is performed. TREATMENT   Observation.  Increasing your fluid intake.  Extracorporeal shock wave lithotripsy--This is a noninvasive procedure that uses shock waves to break up kidney stones.  Surgery may be needed if you have severe pain or persistent obstruction. There are various surgical procedures. Most of the procedures are performed with the use of small instruments. Only small incisions are needed to accommodate these instruments, so recovery time is minimized. The size, location, and chemical composition are all important variables that will determine the proper choice of action for you. Talk to your health care provider to better understand your situation so that you will minimize the risk of injury to yourself and your kidney.  HOME CARE INSTRUCTIONS   Drink enough water and fluids to keep your urine clear or pale yellow. This will help you to pass the stone or stone fragments.  Strain  all urine through the provided strainer. Keep all particulate matter and stones for your health care provider to see. The stone causing the pain may be as small as a grain of salt. It is very important to use the strainer each and every time you pass your urine. The collection of your stone will allow your health care provider to analyze it and verify that a stone has actually passed. The stone analysis will often identify what you can do to reduce the incidence of recurrences.  Only take over-the-counter or prescription medicines for pain, discomfort, or fever as directed by your health care provider.  Make a follow-up appointment with your health care provider as directed.  Get follow-up X-rays if required. The absence of pain does not always mean that the stone has passed. It may have only stopped moving. If the urine remains completely obstructed, it can cause loss of kidney function or even complete destruction of the kidney. It is your responsibility to make sure X-rays and follow-ups are completed. Ultrasounds of the kidney can show blockages and the status of the kidney. Ultrasounds are not associated with any radiation and can be performed easily in a matter of minutes. SEEK MEDICAL CARE IF:  You experience pain that is progressive and unresponsive to any pain medicine you have been prescribed. SEEK IMMEDIATE MEDICAL CARE IF:   Pain cannot be controlled with the prescribed medicine.  You have a fever or shaking chills.  The severity or intensity of pain increases over 18 hours and is not relieved by pain medicine.  You develop a new onset of abdominal pain.  You feel faint or pass out.  You are unable to urinate. MAKE SURE YOU:   Understand these instructions.  Will watch your condition.  Will get help right away if you are not doing well or get worse. Document Released: 11/30/2005 Document Revised: 08/02/2013 Document Reviewed: 05/03/2013 [facility] Patient Information 2015  [place], [place]. This information is not intended to replace advice given to you by your health care provider. Make sure you discuss any questions you have with your health care provider.

## 2014-06-10 NOTE — ED Notes (Signed)
Pt states, about an hour ago started having severe right flank pain, N/V. Denies urinary sypmtoms. Pt in distress, moaning in pain. Pt is AAOX4, skin warm and dry.

## 2014-06-10 NOTE — ED Provider Notes (Signed)
CSN: [id number]     Arrival date & time 06/10/14  1544 History   First MD Initiated Contact with Patient 06/10/14 1557     Chief Complaint  Patient presents with  . Abdominal Pain  . Nausea  . Emesis     (Consider location/radiation/quality/duration/timing/severity/associated sxs/prior Treatment) HPI  This is a 68 year old male with a history of GERD and idiopathic pulmonary fibrosis who presents with right flank pain. Patient reports onset of severe right-sided flank pain approximately one hour prior to arrival. He was feeling well prior to onset of pain. He reports the sharp and radiates into his right groin. He had one episode of nonbilious nonbloody emesis and reports persistent nausea. He rates his pain a 10 out of 10. He denies any hematuria or dysuria. No history of kidney stones.  Patient denies any fevers.  Past Medical History  Diagnosis Date  . Ulcer     unresolved  . GERD (gastroesophageal reflux disease)   . Injury of right hand     permanent damage after workplace 2009 and 2010 Dr Vernona Rieger [facility]  . Arthritis   . Pulmonary fibrosis    Past Surgical History  Procedure Laterality Date  . Colonoscopy  02-24-12    adenomatous polyps removed, repeat in 5 yrs, per Dr. Deatra Ina   . Esophagogastroduodenoscopy  2007  . Hand surgery Right   . Video assisted thoracoscopy Left 03/22/2013    Procedure: VIDEO ASSISTED THORACOSCOPY;  Surgeon: Melrose Nakayama, MD;  Location: [facility];  Service: Thoracic;  Laterality: Left;  . Lung biopsy Left 03/22/2013    Procedure: LUNG BIOPSY;  Surgeon: Melrose Nakayama, MD;  Location: [facility];  Service: Thoracic;  Laterality: Left;   Family History  Problem Relation Age of Onset  . Diabetes    . Cancer      prostate  . Liver cancer Mother   . Diabetes Sister   . Lung cancer Sister   . Heart disease Brother   . Diabetes Brother   . Colon cancer Brother    History  Substance Use Topics  . Smoking status: Former Smoker --  1.00 packs/day for 16 years    Types: Cigarettes    Quit date: 12/14/1978  . Smokeless tobacco: Never Used  . Alcohol Use: No    Review of Systems  Constitutional: Negative.  Negative for fever.  Respiratory: Negative.  Negative for chest tightness and shortness of breath.   Cardiovascular: Negative.  Negative for chest pain.  Gastrointestinal: Positive for nausea and vomiting. Negative for abdominal pain and diarrhea.  Genitourinary: Positive for flank pain. Negative for dysuria and hematuria.  Musculoskeletal: Negative for back pain.  All other systems reviewed and are negative.     Allergies  Review of patient's allergies indicates no known allergies.  Home Medications   Prior to Admission medications   Medication Sig Start Date End Date Taking? Authorizing Josephus Harriger  diphenhydrAMINE (BENADRYL) 25 MG tablet Take 25 mg by mouth every 6 (six) hours as needed for allergies or sleep.   Yes Historical Leonore Frankson, MD  OFEV 150 MG CAPS Take 1 tablet by mouth 2 (two) times daily.    Yes Historical Taliya Mcclard, MD  OVER THE COUNTER MEDICATION Take 1 each by mouth at bedtime. Powdered Swiss herb laxative   Yes Historical Mark Hassey, MD  pantoprazole (PROTONIX) 40 MG tablet Take 40 mg by mouth 2 (two) times daily.   Yes Historical Mae Cianci, MD  ranitidine (ZANTAC) 150 MG tablet Take 150 mg  by mouth at bedtime.   Yes Historical Vittoria Noreen, MD  zolpidem (AMBIEN) 5 MG tablet Take 1 tablet (5 mg total) by mouth at bedtime as needed. 02/12/14  Yes [name], MD  ondansetron (ZOFRAN) 4 MG tablet Take 1 tablet (4 mg total) by mouth every 6 (six) hours. 06/10/14   Merryl Hacker, MD  oxyCODONE-acetaminophen (PERCOCET/ROXICET) 5-325 MG per tablet Take 1-2 tablets by mouth every 6 (six) hours as needed for moderate pain or severe pain. 06/10/14   Merryl Hacker, MD  tamsulosin (FLOMAX) 0.4 MG CAPS capsule Take 1 capsule (0.4 mg total) by mouth daily. 06/10/14   Merryl Hacker, MD   BP 144/74  Pulse  60  Temp(Src) 97.7 F (36.5 C) (Oral)  Resp 20  Ht _0  (1.778 m)  Wt 180 lb (81.647 kg)  BMI 25.83 kg/m2  SpO2 97% Physical Exam  Nursing note and vitals reviewed. Constitutional: He is oriented to person, place, and time. No distress.  Uncomfortable appearing  HENT:  Head: Normocephalic and atraumatic.  Mouth/Throat: Oropharynx is clear and moist.  Eyes: Pupils are equal, round, and reactive to light.  Cardiovascular: Normal rate, regular rhythm and normal heart sounds.   No murmur heard. Pulmonary/Chest: Effort normal and breath sounds normal. No respiratory distress. He has no wheezes.  Mild tachypnea  Abdominal: Soft. Bowel sounds are normal. There is no tenderness. There is no rebound and no guarding.  No CVA tenderness noted  Musculoskeletal: He exhibits no edema.  Lymphadenopathy:    He has no cervical adenopathy.  Neurological: He is alert and oriented to person, place, and time.  Skin: Skin is warm and dry.  Psychiatric: He has a normal mood and affect.    ED Course  Procedures (including critical care time) Labs Review Labs Reviewed  COMPREHENSIVE METABOLIC PANEL - Abnormal; Notable for the following:    Potassium 3.6 (*)    Glucose, Bld 116 (*)    GFR calc non Af Amer 65 (*)    GFR calc Af Amer 76 (*)    All other components within normal limits  URINALYSIS, ROUTINE W REFLEX MICROSCOPIC - Abnormal; Notable for the following:    Color, Urine AMBER (*)    APPearance CLOUDY (*)    Hgb urine dipstick MODERATE (*)    Ketones, ur 15 (*)    Protein, ur 30 (*)    Leukocytes, UA TRACE (*)    All other components within normal limits  CBC WITH DIFFERENTIAL  LIPASE, BLOOD  URINE MICROSCOPIC-ADD ON    Imaging Review Ct Abdomen Pelvis Wo Contrast  06/10/2014   CLINICAL DATA:  Left flank pain.  Nausea and vomiting  EXAM: CT ABDOMEN AND PELVIS WITHOUT CONTRAST  TECHNIQUE: Multidetector CT imaging of the abdomen and pelvis was performed following the standard  protocol without IV contrast.  COMPARISON:  None  FINDINGS: Peripheral interstitial reticulation, traction bronchiectasis and subpleural honeycombing noted in the lung bases. No pleural or pericardial effusion noted.  No focal liver abnormality identified. The gallbladder is normal. No biliary dilatation. Normal appearance of the pancreas and spleen.  The adrenal glands are both normal. The left kidney is unremarkable. Right-sided hydronephrosis and perinephric fat stranding identified. Stone within the right UPJ measures 3 mm, image 46/ series 201. The urinary bladder appears normal. The prostate gland and seminal vesicles are unremarkable. Normal caliber of the abdominal aorta. There is no upper abdominal adenopathy. No pelvic or inguinal adenopathy identified.  The stomach is normal.  The small bowel loops are normal and course and caliber. No obstruction. Normal appearance of the colon  No free fluid or fluid collections within the abdomen or pelvis. There is degenerative disc disease identified at the L5-S1 level.  IMPRESSION: 1. Right UPJ calculus measures 3 mm. There is associated right hydronephrosis. 2. Pulmonary fibrosis. 3. Lumbar degenerative disc disease.   Electronically Signed   By: Kerby Moors M.D.   On: 06/10/2014 18:13     EKG Interpretation None      MDM   Final diagnoses:  Kidney stone    Patient presents with right flank pain. He is uncomfortable on exam but nontoxic. Vital signs are reassuring. History and physical exam no suspicious for kidney stone. No history of kidney stone.  Basic labwork obtained. No evidence of urinary tract infection with hematuria noted. Basic labwork reassuring including kidney function. Patient was given multiple doses of pain medication, Toradol, and 2 L of fluids. On recheck, patient had improvement of pain. Kidney status 3 mm and likely to pass on its own. Discussed the patient plan of care. Patient stated understanding. Will discharge home with  expectant management.  After history, exam, and medical workup I feel the patient has been appropriately medically screened and is safe for discharge home. Pertinent diagnoses were discussed with the patient. Patient was given return precautions.     Merryl Hacker, MD 06/10/14 2003

## 2014-06-10 NOTE — ED Notes (Signed)
The patient tried to void but was unable to.

## 2014-06-10 NOTE — ED Notes (Signed)
Dr. Dina Rich at bedside.

## 2014-06-11 ENCOUNTER — Telehealth: Payer: Self-pay | Admitting: Internal Medicine

## 2014-06-11 ENCOUNTER — Telehealth: Payer: Self-pay | Admitting: Family Medicine

## 2014-06-11 DIAGNOSIS — N200 Calculus of kidney: Secondary | ICD-10-CM

## 2014-06-11 NOTE — Telephone Encounter (Signed)
I atc pt daughter , LM. Called the pt due to recs and time. He states his doctor gave him some meds for his kidney stones and he wants to take this to see if it help before he comes in. [place] Bing, [place]

## 2014-06-11 NOTE — Telephone Encounter (Signed)
Called and spoke with brandi and she stated that the pt is really sick right now.  She stated that they had him in the ER last night with a kidney stone.  She stated that he has a rash on his face  Only and she is not sure if its from the new meds or from something else.  She stated that he was having PVC's last night and his respirations were 33.  He stated that they did give him some pain meds last night and that he did get into see the urologist today for the kidney stones.  brandi was not sure if MR thought the pt needed to come in for appt since he has been so sick recently.  MR please advise. Thanks  No Known Allergies   Current Outpatient Prescriptions on File Prior to Visit  Medication Sig Dispense Refill  . diphenhydrAMINE (BENADRYL) 25 MG tablet Take 25 mg by mouth every 6 (six) hours as needed for allergies or sleep.      Marland Kitchen OFEV 150 MG CAPS Take 1 tablet by mouth 2 (two) times daily.       . ondansetron (ZOFRAN) 4 MG tablet Take 1 tablet (4 mg total) by mouth every 6 (six) hours.  12 tablet  0  . OVER THE COUNTER MEDICATION Take 1 each by mouth at bedtime. Powdered Swiss herb laxative      . oxyCODONE-acetaminophen (PERCOCET/ROXICET) 5-325 MG per tablet Take 1-2 tablets by mouth every 6 (six) hours as needed for moderate pain or severe pain.  20 tablet  0  . pantoprazole (PROTONIX) 40 MG tablet Take 40 mg by mouth 2 (two) times daily.      . ranitidine (ZANTAC) 150 MG tablet Take 150 mg by mouth at bedtime.      . tamsulosin (FLOMAX) 0.4 MG CAPS capsule Take 1 capsule (0.4 mg total) by mouth daily.  10 capsule  0  . zolpidem (AMBIEN) 5 MG tablet Take 1 tablet (5 mg total) by mouth at bedtime as needed.  30 tablet  5   No current facility-administered medications on file prior to visit.

## 2014-06-11 NOTE — Telephone Encounter (Signed)
lmomtcb x 1 for brandi pegg.

## 2014-06-11 NOTE — Telephone Encounter (Signed)
Pt was seen at [facility] today for a kidney stone. Pt needs referral. [place] This is a new pt appt.

## 2014-06-11 NOTE — Telephone Encounter (Signed)
Pt was referred by [facility]

## 2014-06-11 NOTE — Telephone Encounter (Signed)
Rash on his face: he had it with Esbriet. Ofev not reported to cause rash.   Pain : ;likel urology issue  Respiration - 33 could be from pain   IF he is not better or getting worse, he needs to come in and see one of [place]  Dr. Brand Males, M.D., [facility].C.P Pulmonary and Critical Care Medicine Staff Physician [facility] Pager: [phone], If no answer or between  15:00h - 7:00h: call [phone]  06/11/2014 5:52 PM

## 2014-06-11 NOTE — Telephone Encounter (Signed)
Pt's daughter, Eartha Inch, is returning triage's call.  erythromycin

## 2014-06-13 DIAGNOSIS — N200 Calculus of kidney: Secondary | ICD-10-CM

## 2014-06-13 HISTORY — DX: Calculus of kidney: N20.0

## 2014-06-17 HISTORY — PX: EXTRACORPOREAL SHOCK WAVE LITHOTRIPSY: SHX1557

## 2014-06-18 NOTE — Telephone Encounter (Signed)
I tried to call Threasa Beards back to let her know that the referral was done, was put on hold.

## 2014-06-18 NOTE — Telephone Encounter (Signed)
Referral was done

## 2014-07-10 ENCOUNTER — Encounter (HOSPITAL_COMMUNITY): Payer: Self-pay | Admitting: *Deleted

## 2014-07-10 NOTE — Progress Notes (Signed)
Patient denies any history of heart disease denies any chest pain . On phone  interview.

## 2014-07-11 ENCOUNTER — Other Ambulatory Visit: Payer: Self-pay | Admitting: Urology

## 2014-07-11 ENCOUNTER — Encounter (HOSPITAL_COMMUNITY): Payer: Self-pay | Admitting: Pharmacy Technician

## 2014-07-11 NOTE — H&P (Signed)
Active Problems Problems  1. Calculus of right ureter (592.1)  History of Present Illness Mr. Basher returns today in f/u. He has a history of a 2.45m right proximal stone for which I saw him on 6/29. He was back in on 7/14 and was told the stone was at the UVJ but on my review of the films from then and today all I see in the pelvis are phleboliths. He has no associated signs or symptoms.   Past Medical History Problems  1. History of Degenerative lumbar disc (722.52) 2. History of arthritis (V13.4) 3. History of esophageal reflux (V12.79) 4. History of gastric ulcer (V12.79) 5. History of pulmonary fibrosis (V12.69)  Surgical History Problems  1. History of Biopsy Pleural  Current Meds 1. Ofev 150 MG Oral Capsule;  Therapy: (Recorded:29Jun2015) to Recorded 2. Protonix TBEC;  Therapy: (Recorded:29Jun2015) to Recorded 3. Ranitidine HCl TABS;  Therapy: (Recorded:29Jun2015) to Recorded 4. Tamsulosin HCl - 0.4 MG Oral Capsule; TAKE 1 CAPSULE Daily;  Therapy: [id number] (Evaluate:13Aug2015)  Requested for: [id number] Last  Rx:14Jul2015 Ordered  Allergies Medication  1. No Known Drug Allergies  Family History Problems  1. Family history of Deceased : Mother, Father 2. Family history of kidney stones (V18.69) : Sibling 3. Family history of liver cancer (V16.0) : Mother  Social History Problems  1. Caffeine use (V49.89) 2. Former smoker (V15.82) 3. Married 4. No alcohol use 5. Retired  Review of Systems  Genitourinary: no feelings of urinary urgency.  Gastrointestinal: nausea (last week ).  Constitutional: no fever.    Vitals Vital Signs [Data Includes: Last 1 Day]  Recorded: 28Jul2015 10:33AM  Height: 5 ft 10 in Weight: 180 lb  BMI Calculated: 25.83 BSA Calculated: 2 Blood Pressure: 130 / 81 Temperature: 97.4 F Heart Rate: 73  Physical Exam Constitutional: Well nourished and well developed . No acute distress.  Pulmonary: No respiratory distress and  normal respiratory rhythm and effort.  Cardiovascular: Heart rate and rhythm are normal . No peripheral edema.    Results/Data  10 Jul 2014 10:12 AM  UA With REFLEX    COLOR YELLOW     APPEARANCE CLEAR     SPECIFIC GRAVITY 1.010     pH 6.0     GLUCOSE NEG     BILIRUBIN NEG     KETONE NEG     BLOOD NEG     PROTEIN NEG     UROBILINOGEN 0.2     NITRITE NEG     LEUKOCYTE ESTERASE NEG   The following images/tracing/specimen were independently visualized:  KUB today shows no evidence of ureteral stone on the right and there are stable phleboliths in the pelvis. There is a possible stone overlying the right mid kidney that is about 31min size. No other significant abnormalities are noted. I repeated the CT urogram since I couldn't see a ureteral stone and the prior UPJ stone is now in the mid kidney confirming the findings on CT. No other stones are seen and a full report is pending.  The following clinical lab reports were reviewed:  UA reviewed.    Assessment Assessed  1. Calculus of right ureter (592.1)  His stone is now back up in the right kidney.   Plan Calculus of right ureter  1. Follow-up Schedule Surgery Office  Follow-up  Status: Hold For - Appointment   Requested for: [phone]. AU CT-STONE PROTOCOL; Status:In Progress - Specimen/Data Collected;   Done:  [id number]:00AM  I discussed the management options  and will get him set up for right ESWL. I reviewed the risks of bleeding, infection, obstructing fragments with the need for additional therapy, inability to see the stone, injury to the kidney or adjacent structures that can be life threatening, thrombotic events and anesthetic complications.

## 2014-07-12 ENCOUNTER — Encounter (HOSPITAL_COMMUNITY): Payer: Self-pay | Admitting: *Deleted

## 2014-07-12 ENCOUNTER — Encounter (HOSPITAL_COMMUNITY)
Admission: RE | Disposition: A | Discharge status: Home / Self Care | Payer: Self-pay | Source: Ambulatory Visit | Attending: Urology

## 2014-07-12 ENCOUNTER — Ambulatory Visit (HOSPITAL_COMMUNITY)
Admission: RE | Admit: 2014-07-12 | Discharge: 2014-07-12 | Disposition: A | Discharge status: Home / Self Care | Payer: Medicare HMO | Source: Ambulatory Visit | Attending: Urology | Admitting: Urology

## 2014-07-12 ENCOUNTER — Ambulatory Visit (HOSPITAL_COMMUNITY): Payer: Medicare HMO

## 2014-07-12 DIAGNOSIS — M5137 Other intervertebral disc degeneration, lumbosacral region: Secondary | ICD-10-CM | POA: Insufficient documentation

## 2014-07-12 DIAGNOSIS — N200 Calculus of kidney: Secondary | ICD-10-CM | POA: Diagnosis present

## 2014-07-12 DIAGNOSIS — Z87891 Personal history of nicotine dependence: Secondary | ICD-10-CM | POA: Insufficient documentation

## 2014-07-12 DIAGNOSIS — M51379 Other intervertebral disc degeneration, lumbosacral region without mention of lumbar back pain or lower extremity pain: Secondary | ICD-10-CM | POA: Insufficient documentation

## 2014-07-12 DIAGNOSIS — J8410 Pulmonary fibrosis, unspecified: Secondary | ICD-10-CM | POA: Insufficient documentation

## 2014-07-12 DIAGNOSIS — K219 Gastro-esophageal reflux disease without esophagitis: Secondary | ICD-10-CM | POA: Diagnosis not present

## 2014-07-12 DIAGNOSIS — Z79899 Other long term (current) drug therapy: Secondary | ICD-10-CM | POA: Diagnosis not present

## 2014-07-12 DIAGNOSIS — N201 Calculus of ureter: Secondary | ICD-10-CM | POA: Diagnosis not present

## 2014-07-12 HISTORY — DX: Calculus of kidney: N20.0

## 2014-07-12 HISTORY — DX: Personal history of other diseases of the digestive system: Z87.19

## 2014-07-12 SURGERY — LITHOTRIPSY, ESWL
Anesthesia: LOCAL | Laterality: Right

## 2014-07-12 MED ORDER — DIAZEPAM 5 MG PO TABS
10.0000 mg | ORAL_TABLET | ORAL | Status: AC
  Administered 2014-07-12: 10 mg via ORAL
  Filled 2014-07-12: qty 2

## 2014-07-12 MED ORDER — FENTANYL CITRATE 0.05 MG/ML IJ SOLN: 25.0000 ug | INTRAMUSCULAR | Status: DC | PRN

## 2014-07-12 MED ORDER — DIPHENHYDRAMINE HCL 25 MG PO CAPS
25.0000 mg | ORAL_CAPSULE | ORAL | Status: AC
  Administered 2014-07-12: 25 mg via ORAL

## 2014-07-12 MED ORDER — ACETAMINOPHEN 325 MG PO TABS: 650.0000 mg | ORAL_TABLET | ORAL | Status: DC | PRN

## 2014-07-12 MED ORDER — DEXTROSE-NACL 5-0.45 % IV SOLN
INTRAVENOUS | Status: DC
  Administered 2014-07-12: 07:00:00 via INTRAVENOUS

## 2014-07-12 MED ORDER — SODIUM CHLORIDE 0.9 % IJ SOLN: 3.0000 mL | Freq: Two times a day (BID) | INTRAMUSCULAR | Status: DC

## 2014-07-12 MED ORDER — SODIUM CHLORIDE 0.9 % IJ SOLN: 3.0000 mL | INTRAMUSCULAR | Status: DC | PRN

## 2014-07-12 MED ORDER — CIPROFLOXACIN HCL 500 MG PO TABS
500.0000 mg | ORAL_TABLET | ORAL | Status: AC
  Administered 2014-07-12: 500 mg via ORAL
  Filled 2014-07-12: qty 1

## 2014-07-12 MED ORDER — ACETAMINOPHEN 650 MG RE SUPP
650.0000 mg | RECTAL | Status: DC | PRN
  Filled 2014-07-12: qty 1

## 2014-07-12 MED ORDER — SODIUM CHLORIDE 0.9 % IV SOLN: 250.0000 mL | INTRAVENOUS | Status: DC | PRN

## 2014-07-12 MED ORDER — OXYCODONE HCL 5 MG PO TABS: 5.0000 mg | ORAL_TABLET | ORAL | Status: DC | PRN

## 2014-07-12 NOTE — Interval H&P Note (Signed)
History and Physical Interval Note: stone is unchanged  07/12/2014 7:51 AM  Tanner Bishop  has presented today for surgery, with the diagnosis of right renal stone  The various methods of treatment have been discussed with the patient and family. After consideration of risks, benefits and other options for treatment, the patient has consented to  Procedure(s): RIGHT EXTRACORPOREAL SHOCK WAVE LITHOTRIPSY (ESWL) (Right) as a surgical intervention .  The patient's history has been reviewed, patient examined, no change in status, stable for surgery.  I have reviewed the patient's chart and labs.  Questions were answered to the patient's satisfaction.     [name],[place]

## 2014-07-12 NOTE — Discharge Instructions (Signed)
Lithotripsy, Care After Refer to this sheet in the next few weeks. These instructions provide you with information on caring for yourself after your procedure. Your health care provider may also give you more specific instructions. Your treatment has been planned according to current medical practices, but problems sometimes occur. Call your health care provider if you have any problems or questions after your procedure. WHAT TO EXPECT AFTER THE PROCEDURE   Your urine may have a red tinge for a few days after treatment. Blood loss is usually minimal.  You may have soreness in the back or flank area. This usually goes away after a few days. The procedure can cause blotches or bruises on the back where the pressure wave enters the skin. These marks usually cause only minimal discomfort and should disappear in a short time.  Stone fragments should begin to pass within 24 hours of treatment. However, a delayed passage is not unusual.  You may have pain, discomfort, and feel sick to your stomach (nauseated) when the crushed fragments of stone are passed down the tube from the kidney to the bladder. Stone fragments can pass soon after the procedure and may last for up to 4-8 weeks.  A small number of patients may have severe pain when stone fragments are not able to pass, which leads to an obstruction.  If your stone is greater than 1 inch (2.5 cm) in diameter or if you have multiple stones that have a combined diameter greater than 1 inch (2.5 cm), you may require more than one treatment.  If you had a stent placed prior to your procedure, you may experience some discomfort, especially during urination. You may experience the pain or discomfort in your flank or back, or you may experience a sharp pain or discomfort at the base of your penis or in your lower abdomen. The discomfort usually lasts only a few minutes after urinating. HOME CARE INSTRUCTIONS   Rest at home until you feel your energy  improving.  Only take over-the-counter or prescription medicines for pain, discomfort, or fever as directed by your health care provider. Depending on the type of lithotripsy, you may need to take antibiotics and anti-inflammatory medicines for a few days.  Drink enough water and fluids to keep your urine clear or pale yellow. This helps "flush" your kidneys. It helps pass any remaining pieces of stone and prevents stones from coming back.  Most people can resume daily activities within 1-2 days after standard lithotripsy. It can take longer to recover from laser and percutaneous lithotripsy.  If the stones are in your urinary system, you may be asked to strain your urine at home to look for stones. Any stones that are found can be sent to a medical lab for examination.  Visit your health care provider for a follow-up appointment in a few weeks. Your doctor may remove your stent if you have one. Your health care provider will also check to see whether stone particles still remain. SEEK MEDICAL CARE IF:   Your pain is not relieved by medicine.  You have a lasting nauseous feeling.  You feel there is too much blood in the urine.  You develop persistent problems with frequent or painful urination that does not at least partially improve after 2 days following the procedure.  You have a congested cough.  You feel lightheaded.  You develop a rash or any other signs that might suggest an allergic problem.  You develop any reaction or side effects to  your medicine(s). SEEK IMMEDIATE MEDICAL CARE IF:   You experience severe back or flank pain or both.  You see nothing but blood when you urinate.  You cannot pass any urine at all.  You have a fever or shaking chills.  You develop shortness of breath, difficulty breathing, or chest pain.  You develop vomiting that will not stop after 6-8 hours.  You have a fainting episode. Document Released: 12/20/2007 Document Revised: 09/20/2013  Document Reviewed: 06/15/2013 [facility] Patient Information 2015 [place], [place]. This information is not intended to replace advice given to you by your health care provider. Make sure you discuss any questions you have with your health care provider.

## 2014-08-15 ENCOUNTER — Other Ambulatory Visit: Payer: Commercial Managed Care - HMO

## 2014-08-15 ENCOUNTER — Encounter: Payer: Self-pay | Admitting: Internal Medicine

## 2014-08-15 ENCOUNTER — Ambulatory Visit (INDEPENDENT_AMBULATORY_CARE_PROVIDER_SITE_OTHER): Payer: Commercial Managed Care - HMO | Admitting: Internal Medicine

## 2014-08-15 VITALS — BP 116/74 | HR 71 | Ht 70.0 in | Wt 182.0 lb

## 2014-08-15 DIAGNOSIS — Z5181 Encounter for therapeutic drug level monitoring: Secondary | ICD-10-CM

## 2014-08-15 DIAGNOSIS — J84112 Idiopathic pulmonary fibrosis: Secondary | ICD-10-CM

## 2014-08-15 LAB — BASIC METABOLIC PANEL
BUN: 15 mg/dL (ref 6–23)
CO2: 31 mEq/L (ref 19–32)
Calcium: 9.6 mg/dL (ref 8.4–10.5)
Chloride: 101 mEq/L (ref 96–112)
Creatinine, Ser: 1.2 mg/dL (ref 0.4–1.5)
GFR: 66.05 mL/min (ref >60.00)
Glucose, Bld: 90 mg/dL (ref 70–99)
Potassium: 4.2 mEq/L (ref 3.5–5.1)
Sodium: 138 mEq/L (ref 135–145)

## 2014-08-15 LAB — HEPATIC FUNCTION PANEL
ALT: 17 U/L (ref 0–53)
AST: 29 U/L (ref 0–37)
Albumin: 4.2 g/dL (ref 3.5–5.2)
Alkaline Phosphatase: 44 U/L (ref 39–117)
Bilirubin, Direct: 0.2 mg/dL (ref 0.0–0.3)
Total Bilirubin: 0.8 mg/dL (ref 0.2–1.2)
Total Protein: 7.8 g/dL (ref 6.0–8.3)

## 2014-08-15 LAB — CBC
HCT: 44.4 % (ref 39.0–52.0)
Hemoglobin: 15.1 g/dL (ref 13.0–17.0)
MCHC: 34.1 g/dL (ref 30.0–36.0)
MCV: 95.2 fl (ref 78.0–100.0)
Platelets: 255 10*3/uL (ref 150.0–400.0)
RBC: 4.66 Mil/uL (ref 4.22–5.81)
RDW: 14 % (ref 11.5–15.5)
WBC: 10.3 10*3/uL (ref 4.0–10.5)

## 2014-08-15 NOTE — Assessment & Plan Note (Signed)
#  IPF CLinically stable compared to last visit Glad you are tolerating OFEV well Fatigue could be due to OFEV v disease ; unclear Check LFT and CBC and bMET blood test today Continue  Ofev 173m po twice daily; through LBodeprogram  - call if side effects  - If diarrhea, start   Diphenoxylate 5 mg 4 times/day as needed Continue acid reflux treament Continue  take generic fluticasone inhaler 2 squirts each nostril daily Use albuterol 2 puff prn - learn technique [place]Trans[place] team for early evaluation   #Followup Repeat LFT, BMET and CBC  in 3 months Do spirometry with CMA in office (not TJune Leap in 3 monts Do walk test in office (not TJune Leap  in 3 months REturn to see me  In 3 months

## 2014-08-15 NOTE — Progress Notes (Signed)
Subjective:    Patient ID: Tanner Bishop, male    DOB: Sep 30, 1946, 68 y.o.   MRN: [id number]  HPI     OV 08/15/2014  Chief Complaint  Patient presents with  . Follow-up    Pt states he has good days and bad days. Pt c/o increase in weakness and fatigue, pt also c/o dry cough and DOE. Pt denies CP/tightness.    OV 09/27/2013 - IPF followup. LASt seen 07/12/13  - Since last visit he underwent an endoscopy that was normal. He continued to have severe indigestion, acid reflux, abdominal pain, cramping and fatigue with study drug  Pirfenidoen. So on 09/02/2013 he stopped it and after this all symptoms of indigestion acid reflux, brown pain and fatigue have resolved. He says he feels great and is back at baseline. He absolutely does not want to take study drug anymore. His maintaining his weight and attending pulmonary rehabilitation. He has not but we'll have his flu shot today. He is unaware that lung transplant as an option for IPF we discussed this. Of note, skin rash is resolved  .#Indigestion  - was related to pirfenidone - glad resolved with you off pirfenidone  #skin rash on face - resolved with dc pirfenidone and sun screen  #IPF  - clinically stable  - have flu shot today - we discussed transplant of lungs and other drug nimatinib; agreed you will think about it   Continue pulmonary rehab, acid reflux therapy and weight  - No pirfenidone Rx due to side effect proifile - Losing around 2 pounds can be helpful  #FU  3 months with PFT   OV 04/04/2014  Chief Complaint  Patient presents with  . Follow-up    for IPF. Pt states he has been having an increase in dry cough x 1 month.    Followup idiopathic pulmonary fibrosis. He returns after 6 months. At last visit he was intolerant to Pirfenidone and because of his asymptomatic state he refuse to switch to ofev . Now for the last 1 month is reporting insidious onset of cough and shortness of breath/fatigue/chest tightness. He  still is able to do his treadmill exercises without a problem but at the end does feel symptomatic. Cough is present day and night. He mows the yard and the pollen does not seem to make cough worse. He and his wife are concerned that symptoms are due to pulmonary fibrosis. He is now very receptive to starting preventative treatment with OFEV. He did inquire about the side effects of OFEV. He also wants to use albuterol prn  Discussed OFEV and explained  Both drugs OFEV and Esbriet only slow down progression, 1 out of 6 patients  - this means extension in quality of life but no difference in symptoms    - OFEV  - twice daily, no titration,   - no need for sunscreen but high chance of diarrhea and need lomotil , slight increase in heart attack risk and theoretical increase in bleeding risk,   - need monthly blood work for 3 months and then every 6 months  - time to firse exacerbation possibly reduced in one trial but not in another - limited worldwide experience   - ESBRIET  - he did not tolerate  #IPF Likely worse; you need treatment to prevent it from getting worse Check LFT and CBC and bMET blood test today Start Ofev 157m po twice daily; [facility] [place]  - call if side effects If diarrhea, start  Diphenoxylate 5 mg 4 times/day as needed Repeat LFT in 1 month Do PFT and walk test in 1 month Continue acid reflux treament Start  take generic fluticasone inhaler 2 squirts each nostril daily Use albuterol 2 puff prn - learn technique from my CMA   #Followup Repeat LFT in 5 weeks Do full PFT in 5 weeks Do walk test in 5 weeks REturn to see me or my NP Tammy in 5 weeks   OV 05/14/2014  Chief Complaint  Patient presents with  . Follow-up    Pt states breathing is unchanged. C/o SOB in the evening, dry cough. Denies wheeizng and CP.     Followup idiopathic pulmonary fibrosis. He completed one month of OFEV treatment. Denies any side effects other than fatigue.  Overall shortness of breath and cough is the same at mild to moderate level. Over the next month he plans to go to the mountains in [place] which is at a height higher than actual. We discussed transplant issues. He says that he has thought about it but has made no conclusions. He is worried that having being 68 years of age transplant might increase his mortality rate (Body mass index is 28.97 kg/(m^2). and is slightly high for transplant)    Pulmonary function test 05/14/2014: FVC 2.7 L or 67%. FEV1 2.3 the/70%. Ratio of 86/116%. DLCO  20.2/71%. Do not know how it compares to before; cannot find past ones.  Walking desaturation to starting in her feet x3 labs on room at in the office: 05/14/2014: Did not desaturate. Heart rate stayed below 85/min   OV 08/15/2014 Tanner Bishop   Chief Complaint  Patient presents with  . Follow-up    Pt states he has good days and bad days. Pt c/o increase in weakness and fatigue, pt also c/o dry cough and DOE. Pt denies CP/tightness.     FU IPF. On OFEV since early may 2015  Overall well. Lost weight; BMI now 26 from 28. COmpliant with 128m bid OFEV. Walking 5 miles a day wihtout pulse ox and following good diet. Lifting weights. However, says bad days and good days with fatigue. Says overall effort tolerance ok but gets fatigued intermittently and is significant moderate in severity. Wonders if due to [facility]  Frustrated IPF has no cure. Wants to feel bettter. Open to talking and meeting with lung transplant team   WAlking desat test in offic3e 185 feet x 3 laps: did not desaturate but ? Got tachycardic  Last LFT on OFEV was in June 2015: None since then though was ordered   Past, Family, Social reviewed: no change since last visit   Review of Systems  Constitutional: Positive for fatigue. Negative for fever and unexpected weight change.  HENT: Negative for congestion, dental problem, ear pain, nosebleeds, postnasal drip, rhinorrhea, sinus  pressure, sneezing, sore throat and trouble swallowing.   Eyes: Negative for redness and itching.  Respiratory: Positive for cough and shortness of breath. Negative for chest tightness and wheezing.   Cardiovascular: Negative for palpitations and leg swelling.  Gastrointestinal: Negative for nausea and vomiting.  Genitourinary: Negative for dysuria.  Musculoskeletal: Negative for joint swelling.  Skin: Negative for rash.  Neurological: Negative for headaches.  Hematological: Does not bruise/bleed easily.  Psychiatric/Behavioral: Negative for dysphoric mood. The patient is not nervous/anxious.        Objective:   Physical Exam   Filed Vitals:   08/15/14 1330  BP: 116/74  Pulse: 71  Height: _0  (1.778 m)  Weight: 82.555 kg (182 lb)  SpO2: 98%   Estimated body mass index is 26.11 kg/(m^2) as calculated from the following:   Height as of this encounter: _0  (1.778 m).   Weight as of this encounter: 82.555 kg (182 lb).  ursing note and vitals reviewed. Constitutional: He is oriented to person, place, and time. He appears well-developed and well-nourished. No distress.  HENT:  Head: Normocephalic and atraumatic.  Right Ear: External ear normal.  Left Ear: External ear normal.  Mouth/Throat: Oropharynx is clear and moist. No oropharyngeal exudate.  Eyes: Conjunctivae and EOM are normal. Pupils are equal, round, and reactive to light. Right eye exhibits no discharge. Left eye exhibits no discharge. No scleral icterus.  Neck: Normal range of motion. Neck supple. No JVD present. No tracheal deviation present. No thyromegaly present.  Cardiovascular: Normal rate, regular rhythm and intact distal pulses.  Exam reveals no gallop and no friction rub.   No murmur heard. Pulmonary/Chest: Effort normal. No respiratory distress. He has no wheezes. He has rales. He exhibits no tenderness.  Basal crackles  Abdominal: Soft. Bowel sounds are normal. He exhibits no distension and no mass.  There is no tenderness. There is no rebound and no guarding.  Musculoskeletal: Normal range of motion. He exhibits no edema and no tenderness.  Lymphadenopathy:    He has no cervical adenopathy.  Neurological: He is alert and oriented to person, place, and time. He has normal reflexes. No cranial nerve deficit. Coordination normal.  Skin: Skin is warm and dry. No rash noted. He is not diaphoretic. No erythema. No pallor.  Psychiatric: He has a normal mood and affect. His behavior is normal. Judgment and thought content normal.       Assessment & Plan:  #IPF CLinically stable compared to last visit Glad you are tolerating OFEV well Fatigue could be due to OFEV v disease ; unclear Check LFT and CBC and bMET blood test today Continue  Ofev 162m po twice daily; through [facility]  - call if side effects  - If diarrhea, start   Diphenoxylate 5 mg 4 times/day as needed Continue acid reflux treament Continue  take generic fluticasone inhaler 2 squirts each nostril daily Use albuterol 2 puff prn - learn technique from my CMA REfer to dPastosTransplant team for early evaluation   #Followup Repeat LFT, BMET and CBC  in 3 months Do spirometry with CMA in office (not TJune Leap in 3 monts Do walk test in office (not TJune Leap  in 3 months REturn to see me  In 3 months

## 2014-08-15 NOTE — Patient Instructions (Addendum)
#  IPF CLinically stable compared to last visit Glad you are tolerating OFEV well Fatigue could be due to OFEV v disease ; unclear Check LFT and CBC and bMET blood test today Continue  Ofev 150mg po twice daily; through [facility]  - call if side effects  - If diarrhea, start   Diphenoxylate 5 mg 4 times/day as needed Continue acid reflux treament Continue  take generic fluticasone inhaler 2 squirts each nostril daily Use albuterol 2 puff prn - learn technique from my CMA REfer to dUke Transplant team for early evaluation   #Followup Repeat LFT, BMET and CBC  in 3 months Do spirometry with CMA in office (not Tammy Wilson) in 3 monts Do walk test in office (not Tammy Wilson)  in 3 months REturn to see me  In 3 months 

## 2014-08-23 NOTE — Progress Notes (Signed)
Quick Note:  lmtcb ______ 

## 2014-08-30 NOTE — Progress Notes (Signed)
6MW not done; had pulse ox walk in office at [place].   Charges were deleted for walk as this is with OV charge that day.

## 2014-09-03 ENCOUNTER — Other Ambulatory Visit: Payer: Self-pay | Admitting: Internal Medicine

## 2014-11-20 ENCOUNTER — Telehealth: Payer: Self-pay | Admitting: Internal Medicine

## 2014-11-20 NOTE — Telephone Encounter (Signed)
Spoke with [facility] [phone]) and gave  6 refills for Ofev.

## 2014-11-21 ENCOUNTER — Other Ambulatory Visit: Payer: Self-pay | Admitting: *Deleted

## 2014-11-21 MED ORDER — NINTEDANIB ESYLATE 150 MG PO CAPS: 1.0000 | ORAL_CAPSULE | Freq: Two times a day (BID) | ORAL | Status: DC

## 2014-12-14 HISTORY — PX: CATARACT EXTRACTION, BILATERAL: SHX1313

## 2014-12-24 ENCOUNTER — Other Ambulatory Visit (INDEPENDENT_AMBULATORY_CARE_PROVIDER_SITE_OTHER): Payer: Commercial Managed Care - HMO

## 2014-12-24 ENCOUNTER — Ambulatory Visit (INDEPENDENT_AMBULATORY_CARE_PROVIDER_SITE_OTHER): Payer: Commercial Managed Care - HMO | Admitting: Internal Medicine

## 2014-12-24 ENCOUNTER — Encounter: Payer: Self-pay | Admitting: Internal Medicine

## 2014-12-24 VITALS — BP 118/72 | HR 76 | Ht 70.0 in | Wt 174.0 lb

## 2014-12-24 DIAGNOSIS — J84112 Idiopathic pulmonary fibrosis: Secondary | ICD-10-CM

## 2014-12-24 DIAGNOSIS — Z5181 Encounter for therapeutic drug level monitoring: Secondary | ICD-10-CM

## 2014-12-24 LAB — BASIC METABOLIC PANEL
BUN: 14 mg/dL (ref 6–23)
CO2: 29 mEq/L (ref 19–32)
Calcium: 9.3 mg/dL (ref 8.4–10.5)
Chloride: 102 mEq/L (ref 96–112)
Creatinine, Ser: 1 mg/dL (ref 0.4–1.5)
GFR: 80 mL/min (ref >60.00)
Glucose, Bld: 99 mg/dL (ref 70–99)
Potassium: 4 mEq/L (ref 3.5–5.1)
Sodium: 138 mEq/L (ref 135–145)

## 2014-12-24 LAB — HEPATIC FUNCTION PANEL
ALT: 17 U/L (ref 0–53)
AST: 24 U/L (ref 0–37)
Albumin: 4 g/dL (ref 3.5–5.2)
Alkaline Phosphatase: 43 U/L (ref 39–117)
Bilirubin, Direct: 0.1 mg/dL (ref 0.0–0.3)
Total Bilirubin: 0.6 mg/dL (ref 0.2–1.2)
Total Protein: 7.6 g/dL (ref 6.0–8.3)

## 2014-12-24 LAB — CBC
HCT: 46.9 % (ref 39.0–52.0)
Hemoglobin: 15.4 g/dL (ref 13.0–17.0)
MCHC: 32.8 g/dL (ref 30.0–36.0)
MCV: 96.5 fl (ref 78.0–100.0)
Platelets: 294 10*3/uL (ref 150.0–400.0)
RBC: 4.86 Mil/uL (ref 4.22–5.81)
RDW: 14 % (ref 11.5–15.5)
WBC: 10.6 10*3/uL — ABNORMAL HIGH (ref 4.0–10.5)

## 2014-12-24 NOTE — Progress Notes (Signed)
Subjective:    Patient ID: Tanner Bishop, male    DOB: 01-20-1946, 69 y.o.   MRN: [id number]  HPI      OV 08/15/2014  Chief Complaint  Patient presents with  . Follow-up    Pt states he has good days and bad days. Pt c/o increase in weakness and fatigue, pt also c/o dry cough and DOE. Pt denies CP/tightness.    OV 09/27/2013 - IPF followup. LASt seen 07/12/13  - Since last visit he underwent an endoscopy that was normal. He continued to have severe indigestion, acid reflux, abdominal pain, cramping and fatigue with study drug  Pirfenidoen. So on 09/02/2013 he stopped it and after this all symptoms of indigestion acid reflux, brown pain and fatigue have resolved. He says he feels great and is back at baseline. He absolutely does not want to take study drug anymore. His maintaining his weight and attending pulmonary rehabilitation. He has not but we'll have his flu shot today. He is unaware that lung transplant as an option for IPF we discussed this. Of note, skin rash is resolved  .#Indigestion  - was related to pirfenidone - glad resolved with you off pirfenidone  #skin rash on face - resolved with dc pirfenidone and sun screen  #IPF  - clinically stable  - have flu shot today - we discussed transplant of lungs and other drug nimatinib; agreed you will think about it   Continue pulmonary rehab, acid reflux therapy and weight  - No pirfenidone Rx due to side effect proifile - Losing around 2 pounds can be helpful  #FU  3 months with PFT   OV 04/04/2014  Chief Complaint  Patient presents with  . Follow-up    for IPF. Pt states he has been having an increase in dry cough x 1 month.    Followup idiopathic pulmonary fibrosis. He returns after 6 months. At last visit he was intolerant to Pirfenidone and because of his asymptomatic state he refuse to switch to ofev . Now for the last 1 month is reporting insidious onset of cough and shortness of breath/fatigue/chest tightness.  He still is able to do his treadmill exercises without a problem but at the end does feel symptomatic. Cough is present day and night. He mows the yard and the pollen does not seem to make cough worse. He and his wife are concerned that symptoms are due to pulmonary fibrosis. He is now very receptive to starting preventative treatment with OFEV. He did inquire about the side effects of OFEV. He also wants to use albuterol prn  Discussed OFEV and explained  Both drugs OFEV and Esbriet only slow down progression, 1 out of 6 patients  - this means extension in quality of life but no difference in symptoms    - OFEV  - twice daily, no titration,   - no need for sunscreen but high chance of diarrhea and need lomotil , slight increase in heart attack risk and theoretical increase in bleeding risk,   - need monthly blood work for 3 months and then every 6 months  - time to firse exacerbation possibly reduced in one trial but not in another - limited worldwide experience   - ESBRIET  - he did not tolerate  #IPF Likely worse; you need treatment to prevent it from getting worse Check LFT and CBC and bMET blood test today Start Ofev 118m po twice daily; through [place]  - call if side effects If diarrhea,  start   Diphenoxylate 5 mg 4 times/day as needed Repeat LFT in 1 month Do PFT and walk test in 1 month Continue acid reflux treament Start  take generic fluticasone inhaler 2 squirts each nostril daily Use albuterol 2 puff prn - learn technique from my CMA   #Followup Repeat LFT in 5 weeks Do full PFT in 5 weeks Do walk test in 5 weeks REturn to see me or my NP Tammy in 5 weeks   OV 05/14/2014  Chief Complaint  Patient presents with  . Follow-up    Pt states breathing is unchanged. C/o SOB in the evening, dry cough. Denies wheeizng and CP.     Followup idiopathic pulmonary fibrosis. He completed one month of OFEV treatment. Denies any side effects other than fatigue.  Overall shortness of breath and cough is the same at mild to moderate level. Over the next month he plans to go to the mountains in [place] which is at a height higher than actual. We discussed transplant issues. He says that he has thought about it but has made no conclusions. He is worried that having being 69 years of age transplant might increase his mortality rate (Body mass index is 28.97 kg/(m^2). and is slightly high for transplant)    Pulmonary function test 05/14/2014: FVC 2.7 L or 67%. FEV1 2.3 the/70%. Ratio of 86/116%. DLCO  20.2/71%. Do not know how it compares to before; cannot find past ones.  Walking desaturation to starting in her feet x3 labs on room at in the office: 05/14/2014: Did not desaturate. Heart rate stayed below 85/min   OV 08/15/2014 [name]   Chief Complaint  Patient presents with  . Follow-up    Pt states he has good days and bad days. Pt c/o increase in weakness and fatigue, pt also c/o dry cough and DOE. Pt denies CP/tightness.     FU IPF. On OFEV since early may 2015  Overall well. Lost weight; BMI now 26 from 28. COmpliant with 110m bid OFEV. Walking 5 miles a day wihtout pulse ox and following good diet. Lifting weights. However, says bad days and good days with fatigue. Says overall effort tolerance ok but gets fatigued intermittently and is significant moderate in severity. Wonders if due to [facility]  Frustrated IPF has no cure. Wants to feel bettter. Open to talking and meeting with lung transplant team   WAlking desat test in offic3e 185 feet x 3 laps: did not desaturate but ? Got tachycardic  Last LFT on OFEV was in June 2015: None since then though was ordered   OOV 12/24/2014  Chief Complaint  Patient presents with  . Follow-up    Pt stated he is to follow up with [facility] in 6 months. Pt stated his breathing is unchanged. Pt c/o DOE, dry cough. Pt denies CP/tightness.    FU IPF. On OFEV since early may 2015   Overall well. BMI  now less than 25. He continues daily exercise. Does not notice any change in his dyspnea or cough. Dyspnea is only class I mild. However he does have dry cough particularly in the evening hours leading up until bedtime. During this time severity is moderate at a scale of 6-8 out of 10. During sleep and rest of the day is hardly any cough. When he goes to church and comes out he does have a cough. He did see [facility] transplant services and they were pleased with the overall stability of his IPF and did  not deem him to be a transplant candidate at this point but will see him again in 6 months. His understanding is that transplant is not an exclusion for him. He is tolerating OFev well except for mild amount of loose stools 3 times a day which is well tolerable   Spirometry today shows an FVC of 2.5 L/55%, FEV1 of 2.1 L/60%, ratio of 84/108%. - slowly getting worse. FVC in Spring 2014 was 2.9L and June 2015 was 2.7L   Walking desaturation test in office 185 feet 3 laps: Did not desaturate but got tachycardic to a heart rate peak of 136/m  Review of Systems  Constitutional: Negative for fever and unexpected weight change.  HENT: Negative for congestion, dental problem, ear pain, nosebleeds, postnasal drip, rhinorrhea, sinus pressure, sneezing, sore throat and trouble swallowing.   Eyes: Negative for redness and itching.  Respiratory: Positive for cough and shortness of breath. Negative for chest tightness and wheezing.   Cardiovascular: Negative for palpitations and leg swelling.  Gastrointestinal: Negative for nausea and vomiting.  Genitourinary: Negative for dysuria.  Musculoskeletal: Negative for joint swelling.  Skin: Negative for rash.  Neurological: Negative for headaches.  Hematological: Does not bruise/bleed easily.  Psychiatric/Behavioral: Negative for dysphoric mood. The patient is not nervous/anxious.        Objective:   Physical Exam  Filed Vitals:   12/24/14 1458  BP: 118/72    Pulse: 76  Height: _0  (1.778 m)  Weight: 174 lb (78.926 kg)  SpO2: 99%   Estimated body mass index is 24.97 kg/(m^2) as calculated from the following:   Height as of this encounter: _1  (1.778 m).   Weight as of this encounter: 174 lb (78.926 kg).      Assessment & Plan:     ICD-9-CM ICD-10-CM   1. IPF (idiopathic pulmonary fibrosis) 516.31 J84.112 Spirometry with Graph     CBC     Hepatic function panel     Basic Metabolic Panel (BMET)  2. Encounter for therapeutic drug monitoring V58.83 Z51.81     I'm concerned that he is beginning to have early signs of decline in idiopathic pulmonary fibrosis. However he feels stable and somewhat reluctant to accept this possibility.  #IPF CLinically stable compared to last visit but maybe a bit worse or not Glad you met with [facility] transplant team Glad you are tolerating OFEV well Check LFT and CBC and bMET blood test today Continue  Ofev 133m po twice daily; through [facility] Continue acid reflux treament Continue  generic fluticasone inhaler 2 squirts each nostril daily We'll refer you to IPF research trials if we have any  #Followup Repeat LFT, BMET and CBC  in 3 months Do spirometry with CMA in office (not TJune Leap in 3 monts Do walk test in office (not TJune Leap  in 3 months REturn to see me  In 3 months    Dr. MBrand Males M.D., FSutter Auburn Surgery CenterC.P Pulmonary and Critical Care Medicine Staff Physician [facility]Pulmonary and Critical Care Pager: [phone] If no answer or between  15:00h - 7:00h: call [phone]  12/24/2014 3:33 PM

## 2014-12-24 NOTE — Patient Instructions (Addendum)
#  IPF CLinically stable compared to last visit but maybe a bit worse or not Glad you met with [facility] transplant team Glad you are tolerating OFEV well Check LFT and CBC and bMET blood test today Continue  Ofev 179m po twice daily; through [place] Continue acid reflux treament Continue  generic fluticasone inhaler 2 squirts each nostril daily We'll refer you to IPF research trials if we have any  #Followup Repeat LFT, BMET and CBC  in 3 months Do spirometry with CMA in office (not TJune Leap in 3 monts Do walk test in office (not TJune Leap  in 3 months REturn to see me  In 3 months

## 2014-12-26 ENCOUNTER — Telehealth: Payer: Self-pay | Admitting: Internal Medicine

## 2014-12-26 NOTE — Telephone Encounter (Signed)
Attempted to call pt. Line went dead after 4 rings. Will try back.

## 2014-12-27 ENCOUNTER — Telehealth: Payer: Self-pay | Admitting: Internal Medicine

## 2014-12-27 NOTE — Telephone Encounter (Signed)
I think it has to be recertified in a similar process to Mr Vira Blanco. I might need to redo prescription etc., let me know

## 2014-12-27 NOTE — Telephone Encounter (Signed)
Received letter. Within the letter, [employer] has changed how the medication (OFEV) is covered in 2016. [employer] is requesting we either switch to a new medicine, request PA or have [employer] make an exception to how they cover the medication.  MR please advise.Marland Kitchen

## 2014-12-27 NOTE — Telephone Encounter (Signed)
Notes Recorded by Brand Males, MD on 12/24/2014 at 5:47 PM Cbc. bmet lft normal with OFEV  -----  lmomtcb for pt

## 2014-12-27 NOTE — Telephone Encounter (Signed)
Pt returned call - [phone]

## 2014-12-27 NOTE — Telephone Encounter (Signed)
Pt is aware of results. 

## 2014-12-28 NOTE — Telephone Encounter (Signed)
The letter states that we need to do PA or prescribe something different Do you want to proceed with PA? Please advise, thanks!

## 2014-12-31 NOTE — Telephone Encounter (Signed)
MR please advise. Thanks! 

## 2015-01-01 NOTE — Telephone Encounter (Signed)
[place] [facility], [phone]. Spoke to rep and was informed pt will need PA and not to initiate a new order for OFEV (otherwise same process will be needed). PA form faxed to triage fax. Completed form. Form placed in MR's look at's to be signed.

## 2015-01-01 NOTE — Telephone Encounter (Signed)
We basically need to a full new script and go through the process again just like we did other patient for esbriet. Ask Daneil Dan. I will be in office at 4pm 01/01/2015 Tuesday and I can sign the form  Thanks  Dr. Brand Males, M.D., [facility].C.P Pulmonary and Critical Care Medicine Staff Physician [place] [facility] Critical [facility] Pager: [phone], If no answer or between  15:00h - 7:00h: call [phone]  01/01/2015 12:27 AM

## 2015-01-03 ENCOUNTER — Telehealth: Payer: Self-pay | Admitting: Internal Medicine

## 2015-01-03 NOTE — Telephone Encounter (Signed)
Error.

## 2015-01-03 NOTE — Telephone Encounter (Signed)
Signed and given to [facility] 01/03/2015 . Will close note but route to [facility]

## 2015-01-03 NOTE — Telephone Encounter (Signed)
Completed PA form faxed to [facility] at [phone]. Will await a decision.

## 2015-01-10 NOTE — Telephone Encounter (Signed)
Ofev 168m has been approved till 01/03/2017 [facility]

## 2015-01-10 NOTE — Telephone Encounter (Signed)
Called and made pt aware of the approval. Pt verbalized understanding and denied any further questions or concerns at this time.

## 2015-02-11 ENCOUNTER — Ambulatory Visit (INDEPENDENT_AMBULATORY_CARE_PROVIDER_SITE_OTHER): Payer: Commercial Managed Care - HMO | Admitting: Internal Medicine

## 2015-02-11 VITALS — BP 128/72 | HR 138

## 2015-02-11 DIAGNOSIS — L290 Pruritus ani: Secondary | ICD-10-CM

## 2015-02-11 DIAGNOSIS — Z006 Encounter for examination for normal comparison and control in clinical research program: Secondary | ICD-10-CM

## 2015-02-11 DIAGNOSIS — J84112 Idiopathic pulmonary fibrosis: Secondary | ICD-10-CM

## 2015-02-11 DIAGNOSIS — I4892 Unspecified atrial flutter: Secondary | ICD-10-CM | POA: Insufficient documentation

## 2015-02-11 MED ORDER — METOPROLOL TARTRATE 25 MG PO TABS: 25.0000 mg | ORAL_TABLET | Freq: Every day | ORAL | Status: DC

## 2015-02-11 NOTE — Patient Instructions (Addendum)
ICD-9-CM ICD-10-CM   1. Exam for clinical research V70.7 Z00.6   2. IPF (idiopathic pulmonary fibrosis) 516.31 J84.112 Spirometry with Graph     EKG 12-Lead     EKG 12-Lead  3. Atrial flutter, unspecified 427.32 I48.92   4. Perianal itch 698.0 L29.0     #IPF and research  - proceed with afferent study screening  #Atrial Flutter  - start lopressor XL 58m once a day  - refer Dr DLoralie Champagnecardiology - already spoke to him  #Perinanal itch  - due to loose stools of ofev - try sitz bath and wet wipes  #Followup  - per Afferent study procool

## 2015-02-11 NOTE — Progress Notes (Signed)
Subjective:    Patient ID: Tanner Bishop, male    DOB: [birth year], 69 y.o.   MRN: [id number]  HPI   #IPF  - On OFEV since early May 2015   PFT FVC fev1 ratio BD fev1 TLC DLCO Walk test 164f x 3 laps wt rx             Spring 2014 2.9:L L/% /%        June 2015 2.7L/67% 2.34L/78% 86  3.68/57% 20.24/71%   ofev start  [name]  2016 2.5L/55% 2.1L/60% 84/108%    No desat, Pk HR 130    02/11/2015  2.59L/56% 2.24L/63% 87/112%               OV 02/11/2015  Chief Complaint  Patient presents with  . Research    Screening for Afferent Study.     RESEARCH SUBJECT In - ClinicalTrials.gov Identifier: [id number]  Sponsored by [employer]#[phone]  A Phase 2  Randomized Placebo-Controlled, Cross-Over Study to Assess the Efficacy and Safety of AF-219,  P2X3 Receptor Antagonist, in Subjects With Idiopathic Pulmonary Fibrosis (IPF) With Persistent Cough.  Key (but not limited to) [employer] - 2 weeks of placebo v drug -> 2-3 week washout -> 2 weeks of placebo v drug - AF-219 at 547mtwice daily dosage v Placebo - Primary outcome: reduction in cough frequency; objective cough monitoring - Secondary outcome: subjective reduction & improvement in cough related quality of life - Side effects (main and not comprehensive)  - <10%: flank pain, cough, nasal dryness, rhinitis, dry mouth   - <50%: oral side effects ranging from dysgeusia, paresthesia, hypoestheia, hypogeusia, and ageusia - tends to attenuate over time  - published study in Lancet at higher dose of 60033mid: drop out rate of 25%  - at lower dose: drop out rate believed to be very low    This Visit 02/11/2015 is a  RESEARCH Visit for screening. Last seen [name] 2016. Since then no new complaints. Does daily exercise and limited by baseline mild dyspnea. Has chronic cough - feels 5 of 10 in severeity. COmpliant with OFEV. REports for 1st time:  perirectal itching due to baseline loose stools and is using wet  wipes   SpiArlyce Harman29/2016 _=restriction moderate similar to jan 2016 without change but worse compared to 2014 and 2015  EKG - new onset Atrial flutter but normal QTc < 450m57m     has a past medical history of Ulcer; GERD (gastroesophageal reflux disease); Injury of right hand; Arthritis; Pulmonary fibrosis; Renal stone (06/2014); and H/O hiatal hernia.   reports that he quit smoking about 36 years ago. His smoking use included Cigarettes. He has a 16 pack-year smoking history. He has never used smokeless tobacco.  Past Surgical History  Procedure Laterality Date  . Colonoscopy  02-24-12    adenomatous polyps removed, repeat in 5 yrs, per Dr. KaplDeatra Ina Esophagogastroduodenoscopy  2007  . Hand surgery Right   . Video assisted thoracoscopy Left 03/22/2013    Procedure: VIDEO ASSISTED THORACOSCOPY;  Surgeon: StevMelrose Nakayama;  [facility];  Laterality: Left;  . Lung biopsy Left 03/22/2013    Procedure: LUNG BIOPSY;  Surgeon: StevMelrose Nakayama;  [facility];  Laterality: Left;    No Known Allergies  Immunization History  Administered Date(s) Administered  . Influenza Split 09/13/2013  . Influenza-Unspecified 09/07/2014  . Pneumococcal Polysaccharide-23 05/26/2013  . Zoster 02/19/2014  Family History  Problem Relation Age of Onset  . Diabetes    . Cancer      prostate  . Liver cancer Mother   . Diabetes Sister   . Lung cancer Sister   . Heart disease Brother   . Diabetes Brother   . Colon cancer Brother      Current outpatient prescriptions:  .  diphenhydrAMINE (BENADRYL) 25 MG tablet, Take 25 mg by mouth every 6 (six) hours as needed for allergies or sleep., Disp: , Rfl:  .  Nintedanib Esylate (OFEV) 150 MG CAPS, Take 1 tablet by mouth 2 (two) times daily., Disp: 60 capsule, Rfl: 0 .  pantoprazole (PROTONIX) 40 MG tablet, Take 40 mg by mouth 2 (two) times daily., Disp: , Rfl:  .  ranitidine (ZANTAC) 300 MG  tablet, TAKE 1 TABLET BY MOUTH AT BEDTIME, Disp: 90 tablet, Rfl: 3 .  zolpidem (AMBIEN) 5 MG tablet, Take 5 mg by mouth at bedtime as needed for sleep., Disp: , Rfl:       Review of Systems  +ve cough, dyspnea Otherwise feels well  Objective:   Physical Exam   Filed Vitals:   02/11/15 1545  BP: 128/72  Pulse: 138  SpO2: 95%    Exam - non focal except basal crackles - research sheet filled out  EKG with QTc < 434mec but has new onset A FLutter +  - confirmed with Dr DLoralie Champagneof cards      Assessment & Plan:     ICD-9-CM ICD-10-CM   1. Exam for clinical research V70.7 Z00.6   2. IPF (idiopathic pulmonary fibrosis) 516.31 J84.112 Spirometry with Graph     EKG 12-Lead     EKG 12-Lead  3. Atrial flutter, unspecified 427.32 I48.92   4. Perianal itch 698.0 L29.0      #IPF and research  - proceed with afferent study screening  #Atrial Flutter  - start lopressor XL 252monce a day  - refer Dr DaLoralie Champagneardiology - already spoke to him  #Perinanal itch  - due to loose stools of ofev - try sitz bath and wet wipes  #Followup  - per Afferent study procool

## 2015-02-13 ENCOUNTER — Institutional Professional Consult (permissible substitution): Payer: Commercial Managed Care - HMO | Admitting: Internal Medicine

## 2015-02-20 ENCOUNTER — Other Ambulatory Visit (INDEPENDENT_AMBULATORY_CARE_PROVIDER_SITE_OTHER): Payer: Commercial Managed Care - HMO

## 2015-02-20 DIAGNOSIS — Z0000 Encounter for general adult medical examination without abnormal findings: Secondary | ICD-10-CM

## 2015-02-20 LAB — COMPREHENSIVE METABOLIC PANEL
ALT: 20 U/L (ref 0–53)
AST: 23 U/L (ref 0–37)
Albumin: 4.1 g/dL (ref 3.5–5.2)
Alkaline Phosphatase: 48 U/L (ref 39–117)
BUN: 17 mg/dL (ref 6–23)
CO2: 30 mEq/L (ref 19–32)
Calcium: 9.5 mg/dL (ref 8.4–10.5)
Chloride: 103 mEq/L (ref 96–112)
Creatinine, Ser: 1.07 mg/dL (ref 0.40–1.50)
GFR: 73.1 mL/min (ref >60.00)
Glucose, Bld: 108 mg/dL — ABNORMAL HIGH (ref 70–99)
Potassium: 4.3 mEq/L (ref 3.5–5.1)
Sodium: 139 mEq/L (ref 135–145)
Total Bilirubin: 0.5 mg/dL (ref 0.2–1.2)
Total Protein: 7.1 g/dL (ref 6.0–8.3)

## 2015-02-20 LAB — CBC WITH DIFFERENTIAL/PLATELET
Basophils Absolute: 0.1 10*3/uL (ref 0.0–0.1)
Basophils Relative: 0.6 % (ref 0.0–3.0)
Eosinophils Absolute: 0.3 10*3/uL (ref 0.0–0.7)
Eosinophils Relative: 3 % (ref 0.0–5.0)
HCT: 45.3 % (ref 39.0–52.0)
Hemoglobin: 15.2 g/dL (ref 13.0–17.0)
Lymphocytes Relative: 31.9 % (ref 12.0–46.0)
Lymphs Abs: 3.3 10*3/uL (ref 0.7–4.0)
MCHC: 33.6 g/dL (ref 30.0–36.0)
MCV: 95.4 fl (ref 78.0–100.0)
Monocytes Absolute: 1 10*3/uL (ref 0.1–1.0)
Monocytes Relative: 9.7 % (ref 3.0–12.0)
Neutro Abs: 5.7 10*3/uL (ref 1.4–7.7)
Neutrophils Relative %: 54.8 % (ref 43.0–77.0)
Platelets: 293 10*3/uL (ref 150.0–400.0)
RBC: 4.75 Mil/uL (ref 4.22–5.81)
RDW: 14.1 % (ref 11.5–15.5)
WBC: 10.4 10*3/uL (ref 4.0–10.5)

## 2015-02-20 LAB — POCT URINALYSIS DIPSTICK
Blood, UA: NEGATIVE
Glucose, UA: NEGATIVE
Ketones, UA: NEGATIVE
Leukocytes, UA: NEGATIVE
Nitrite, UA: NEGATIVE
Spec Grav, UA: 1.02
Urobilinogen, UA: 0.2
pH, UA: 5.5

## 2015-02-20 LAB — LIPID PANEL
Cholesterol: 206 mg/dL — ABNORMAL HIGH (ref 0–200)
HDL: 55 mg/dL (ref >39.00)
LDL Cholesterol: 134 mg/dL — ABNORMAL HIGH (ref 0–99)
NonHDL: 151
Total CHOL/HDL Ratio: 4
Triglycerides: 83 mg/dL (ref 0.0–149.0)
VLDL: 16.6 mg/dL (ref 0.0–40.0)

## 2015-02-20 LAB — PSA: PSA: 0.78 ng/mL (ref 0.10–4.00)

## 2015-02-20 LAB — TSH: TSH: 1.95 u[IU]/mL (ref 0.35–4.50)

## 2015-02-27 ENCOUNTER — Encounter: Payer: Self-pay | Admitting: Family Medicine

## 2015-02-27 ENCOUNTER — Ambulatory Visit (INDEPENDENT_AMBULATORY_CARE_PROVIDER_SITE_OTHER): Payer: Commercial Managed Care - HMO | Admitting: Family Medicine

## 2015-02-27 VITALS — BP 100/65 | HR 92 | Temp 98.5°F | Ht 70.0 in | Wt 174.0 lb

## 2015-02-27 DIAGNOSIS — Z0000 Encounter for general adult medical examination without abnormal findings: Secondary | ICD-10-CM

## 2015-02-27 DIAGNOSIS — R739 Hyperglycemia, unspecified: Secondary | ICD-10-CM

## 2015-02-27 DIAGNOSIS — E785 Hyperlipidemia, unspecified: Secondary | ICD-10-CM | POA: Insufficient documentation

## 2015-02-27 MED ORDER — ZOLPIDEM TARTRATE 5 MG PO TABS: 5.0000 mg | ORAL_TABLET | Freq: Every evening | ORAL | Status: DC | PRN

## 2015-02-27 MED ORDER — PANTOPRAZOLE SODIUM 40 MG PO TBEC: 40.0000 mg | DELAYED_RELEASE_TABLET | Freq: Two times a day (BID) | ORAL | Status: DC

## 2015-02-27 NOTE — Progress Notes (Signed)
   Subjective:    Patient ID: Tanner Bishop, male    DOB: [birth year], 69 y.o.   MRN: [id number]  HPI 69 yr old male for a cpx. He feels well in general. He still walks 5 miles a day. He checks his oxygen saturation frequently and it runs 95-97%. He sees Dr. Onnie Graham for his pulmonary fibrosis. He saw Dr. Queen Slough at [facility] last October to discuss possible lung transplant surgery but they said his lung function is adequate right now and that he does not require surgery at this time. He recently had an EKG in the pulmonary office which showed new onset atrial flutter with a ventricular rate of 138. He was started on metoprolol succinate 25 mg daily and this has kept his resting heart rate around 90. Tanner Bishop is concerned about this because his sister passed away earlier this year from complications related to atrial fibrillation. His recent labs work with [place] was remarkable for an LDL of 138 and a gulcose of 108.    Review of Systems  Constitutional: Negative.   HENT: Negative.   Eyes: Negative.   Respiratory: Negative.   Cardiovascular: Negative.   Gastrointestinal: Negative.   Genitourinary: Negative.   Musculoskeletal: Negative.   Skin: Negative.   Neurological: Negative.   Psychiatric/Behavioral: Negative.        Objective:   Physical Exam  Constitutional: He is oriented to person, place, and time. He appears well-developed and well-nourished. No distress.  HENT:  Head: Normocephalic and atraumatic.  Right Ear: External ear normal.  Left Ear: External ear normal.  Nose: Nose normal.  Mouth/Throat: Oropharynx is clear and moist. No oropharyngeal exudate.  Eyes: Conjunctivae and EOM are normal. Pupils are equal, round, and reactive to light. Right eye exhibits no discharge. Left eye exhibits no discharge. No scleral icterus.  Neck: Neck supple. No JVD present. No tracheal deviation present. No thyromegaly present.  Cardiovascular: Regular rhythm, normal heart sounds and intact distal  pulses.  Exam reveals no gallop and no friction rub.   No murmur heard. Rate is a little fast but rhythm is regular  Pulmonary/Chest: Effort normal and breath sounds normal. No respiratory distress. He has no wheezes. He has no rales. He exhibits no tenderness.  Abdominal: Soft. Bowel sounds are normal. He exhibits no distension and no mass. There is no tenderness. There is no rebound and no guarding.  Genitourinary: Rectum normal, prostate normal and penis normal. Guaiac negative stool. No penile tenderness.  Musculoskeletal: Normal range of motion. He exhibits no edema or tenderness.  Lymphadenopathy:    He has no cervical adenopathy.  Neurological: He is alert and oriented to person, place, and time. He has normal reflexes. No cranial nerve deficit. He exhibits normal muscle tone. Coordination normal.  Skin: Skin is warm and dry. No rash noted. He is not diaphoretic. No erythema. No pallor.  Psychiatric: He has a normal mood and affect. His behavior is normal. Judgment and thought content normal.          Assessment & Plan:  Well exam. We will refer him to meet with Nutrition to discuss lowering his cholesterol and glucose intake. He will see Dr. Aundra Dubin on 03-18-15.

## 2015-02-27 NOTE — Progress Notes (Signed)
Pre visit review using our clinic review tool, if applicable. No additional management support is needed unless otherwise documented below in the visit note.

## 2015-03-07 ENCOUNTER — Encounter: Payer: Self-pay | Admitting: *Deleted

## 2015-03-07 ENCOUNTER — Ambulatory Visit (INDEPENDENT_AMBULATORY_CARE_PROVIDER_SITE_OTHER): Payer: Commercial Managed Care - HMO | Admitting: Internal Medicine

## 2015-03-07 ENCOUNTER — Encounter: Payer: Self-pay | Admitting: Internal Medicine

## 2015-03-07 VITALS — BP 128/72 | HR 106 | Ht 69.0 in | Wt 172.0 lb

## 2015-03-07 DIAGNOSIS — E785 Hyperlipidemia, unspecified: Secondary | ICD-10-CM

## 2015-03-07 DIAGNOSIS — I4892 Unspecified atrial flutter: Secondary | ICD-10-CM

## 2015-03-07 MED ORDER — RIVAROXABAN 20 MG PO TABS: 20.0000 mg | ORAL_TABLET | Freq: Every day | ORAL | Status: DC

## 2015-03-07 NOTE — Assessment & Plan Note (Signed)
He has a rapid ventricular rate. He is on a beta blocker. I have discussed the treatment options. Will schedule catheter ablation of atrial flutter after he has had 3-4 weeks of systemic anti-coagulation.

## 2015-03-07 NOTE — Patient Instructions (Signed)
Your physician has recommended that you have an ablation. Catheter ablation is a medical procedure used to treat some cardiac arrhythmias (irregular heartbeats). During catheter ablation, a long, thin, flexible tube is put into a blood vessel in your groin (upper thigh), or neck. This tube is called an ablation catheter. It is then guided to your heart through the blood vessel. Radio frequency waves destroy small areas of heart tissue where abnormal heartbeats may cause an arrhythmia to start. Please see the instruction sheet given to you today.  See instruction sheet for procedure  Your physician recommends that you return for lab work on 04/01/15  Cardiac Ablation Cardiac ablation is a procedure to disable a small amount of heart tissue in very specific places. The heart has many electrical connections. Sometimes these connections are abnormal and can cause the heart to beat very fast or irregularly. By disabling some of the problem areas, heart rhythm can be improved or made normal. Ablation is done for people who:   Have Wolff-Parkinson-White syndrome.   Have other fast heart rhythms (tachycardia).   Have taken medicines for an abnormal heart rhythm (arrhythmia) that resulted in:   No success.   Side effects.   May have a high-risk heartbeat that could result in death.  LET [facility] CARE PROVIDER KNOW ABOUT:   Any allergies you have or any previous reactions you have had to X-ray dye, food (such as seafood), medicine, or tape.   All medicines you are taking, including vitamins, herbs, eye drops, creams, and over-the-counter medicines.   Previous problems you or members of your family have had with the use of anesthetics.   Any blood disorders you have.   Previous surgeries or procedures (such as a kidney transplant) you have had.   Medical conditions you have (such as kidney failure).  RISKS AND COMPLICATIONS Generally, cardiac ablation is a safe procedure.  However, problems can occur and include:   Increased risk of cancer. Depending on how long it takes to do the ablation, the dose of radiation can be high.  Bruising and bleeding where a thin, flexible tube (catheter) was inserted during the procedure.   Bleeding into the chest, especially into the sac that surrounds the heart (serious).  Need for a permanent pacemaker if the normal electrical system is damaged.   The procedure may not be fully effective, and this may not be recognized for months. Repeat ablation procedures are sometimes required. BEFORE THE PROCEDURE   Follow any instructions from your health care provider regarding eating and drinking before the procedure.   Take your medicines as directed at regular times with water, unless instructed otherwise by your health care provider. If you are taking diabetes medicine, including insulin, ask how you are to take it and if there are any special instructions you should follow. It is common to adjust insulin dosing the day of the ablation.  PROCEDURE  An ablation is usually performed in a catheterization laboratory with the guidance of fluoroscopy. Fluoroscopy is a type of X-ray that helps your health care provider see images of your heart during the procedure.   An ablation is a minimally invasive procedure. This means a small cut (incision) is made in either your neck or groin. Your health care provider will decide where to make the incision based on your medical history and physical exam.  An IV tube will be started before the procedure begins. You will be given an anesthetic or medicine to help you relax (sedative).  The skin on your neck or groin will be numbed. A needle will be inserted into a large vein in your neck or groin and catheters will be threaded to your heart.  A special dye that shows up on fluoroscopy pictures may be injected through the catheter. The dye helps your health care provider see the area of the  heart that needs treatment.  The catheter has electrodes on the tip. When the area of heart tissue that is causing the arrhythmia is found, the catheter tip will send an electrical current to the area and "scar" the tissue. Three types of energy can be used to ablate the heart tissue:   Heat (radiofrequency energy).   Laser energy.   Extreme cold (cryoablation).   When the area of the heart has been ablated, the catheter will be taken out. Pressure will be held on the insertion site. This will help the insertion site clot and keep it from bleeding. A bandage will be placed on the insertion site.  AFTER THE PROCEDURE   After the procedure, you will be taken to a recovery area where your vital signs (blood pressure, heart rate, and breathing) will be monitored. The insertion site will also be monitored for bleeding.   You will need to lie still for 4-6 hours. This is to ensure you do not bleed from the catheter insertion site.  Document Released: 04/18/2009 Document Revised: 04/16/2014 Document Reviewed: 04/24/2013 [facility] Patient Information 2015 [place], [place]. This information is not intended to replace advice given to you by your health care provider. Make sure you discuss any questions you have with your health care provider.

## 2015-03-07 NOTE — Progress Notes (Signed)
HPI Tanner Bishop is referred today by Dr. Purnell Shoemaker for evaluation of atrial flutter. He has a h/o IPF but has not required oxygen therapy. He was treated with metoprolol for rate control but did not get started on anti-coagulation. He does not feel palpitations. He thinks he may have some fatigue and gets tired more easily.  No Known Allergies   Current Outpatient Prescriptions  Medication Sig Dispense Refill  . diphenhydrAMINE (BENADRYL) 25 MG tablet Take 25 mg by mouth every 6 (six) hours as needed for allergies or sleep.    . metoprolol tartrate (LOPRESSOR) 25 MG tablet Take 1 tablet (25 mg total) by mouth daily. 30 tablet 1  . Nintedanib Esylate (OFEV) 150 MG CAPS Take 1 tablet by mouth 2 (two) times daily. 60 capsule 0  . pantoprazole (PROTONIX) 40 MG tablet Take 1 tablet (40 mg total) by mouth 2 (two) times daily. 180 tablet 3  . ranitidine (ZANTAC) 300 MG tablet TAKE 1 TABLET BY MOUTH AT BEDTIME 90 tablet 3  . zolpidem (AMBIEN) 5 MG tablet Take 1 tablet (5 mg total) by mouth at bedtime as needed for sleep. 30 tablet 5  . rivaroxaban (XARELTO) 20 MG TABS tablet Take 1 tablet (20 mg total) by mouth daily with supper. 30 tablet 0   No current facility-administered medications for this visit.     Past Medical History  Diagnosis Date  . Ulcer     unresolved  . GERD (gastroesophageal reflux disease)   . Injury of right hand     permanent damage after workplace 2009 and 2010 Dr Vernona Rieger [facility]  . Arthritis   . Pulmonary fibrosis     sees Dr. Onnie Graham   . Renal stone 06/2014  . H/O hiatal hernia   . Hyperlipidemia   . Atrial flutter     sees Dr. Loralie Champagne     ROS:   All systems reviewed and negative except as noted in the HPI.   Past Surgical History  Procedure Laterality Date  . Colonoscopy  02-24-12    adenomatous polyps removed, repeat in 5 yrs, per Dr. Deatra Ina   . Esophagogastroduodenoscopy  2007  . Hand surgery Right   . Video assisted  thoracoscopy Left 03/22/2013    Procedure: VIDEO ASSISTED THORACOSCOPY;  Surgeon: Melrose Nakayama, MD;  Location: [facility];  Service: Thoracic;  Laterality: Left;  . Lung biopsy Left 03/22/2013    Procedure: LUNG BIOPSY;  Surgeon: Melrose Nakayama, MD;  Location: [place];  Service: Thoracic;  Laterality: Left;  . Extracorporeal shock wave lithotripsy  06-17-14    per Dr. Jeffie Pollock      Family History  Problem Relation Age of Onset  . Diabetes    . Cancer      prostate  . Liver cancer Mother   . Diabetes Sister   . Lung cancer Sister   . Heart disease Brother   . Diabetes Brother   . Colon cancer Brother      History   Social History  . Marital Status: Married    Spouse Name: N/A  . Number of Children: N/A  . Years of Education: N/A   Occupational History  . LABORER   . Disabled     Social History Main Topics  . Smoking status: Former Smoker -- 1.00 packs/day for 16 years    Types: Cigarettes    Quit date: 12/14/1978  . Smokeless tobacco: Never Used  . Alcohol Use: No  .  Drug Use: No  . Sexual Activity: Not on file   Other Topics Concern  . Not on file   Social History Narrative     BP 128/72 mmHg  Pulse 106  Ht _0  (1.753 m)  Wt 172 lb (78.019 kg)  BMI 25.39 kg/m2  Physical Exam:  Well appearing 69 yo man, NAD HEENT: Unremarkable Neck:  No JVD, no thyromegally Lymphatics:  No adenopathy Back:  No CVA tenderness Lungs:  Clear with no wheezes HEART:  IRegular tachy rate and rhythm, no murmurs, no rubs, no clicks Abd:  soft, positive bowel sounds, no organomegally, no rebound, no guarding Ext:  2 plus pulses, no edema, no cyanosis, no clubbing Skin:  No rashes no nodules Neuro:  CN II through XII intact, motor grossly intact  EKG - atrial flutter with a RVR  Assess/Plan:

## 2015-03-07 NOTE — Assessment & Plan Note (Addendum)
He will maintain a low fat diet.

## 2015-03-07 NOTE — Assessment & Plan Note (Signed)
He appears to be doing well, and denies other symptoms. Will follow.

## 2015-03-26 ENCOUNTER — Ambulatory Visit (INDEPENDENT_AMBULATORY_CARE_PROVIDER_SITE_OTHER): Payer: Commercial Managed Care - HMO | Admitting: Internal Medicine

## 2015-03-26 ENCOUNTER — Telehealth: Payer: Self-pay | Admitting: Internal Medicine

## 2015-03-26 ENCOUNTER — Encounter: Payer: Self-pay | Admitting: Internal Medicine

## 2015-03-26 VITALS — BP 106/64 | HR 59 | Ht 69.0 in | Wt 175.8 lb

## 2015-03-26 DIAGNOSIS — J84112 Idiopathic pulmonary fibrosis: Secondary | ICD-10-CM | POA: Diagnosis not present

## 2015-03-26 NOTE — Progress Notes (Signed)
Subjective:    Patient ID: Tanner Bishop, male    DOB: Jun 29, 1946, 69 y.o.   MRN: [id number]  HPI    #IPF  - On OFEV since early May 2015   PFT FVC fev1 ratio BD fev1 TLC DLCO Walk test 158f x 3 laps wt rx             Spring 2014 2.9:L L/% /%        June 2015 2.7L/67% 2.34L/78% 86  3.68/57% 20.24/71%   ofev start  [name]  2016 2.5L/55% 2.1L/60% 84/108%    No desat, Pk HR 130    02/11/2015 Screening visit afferent cough study 2.59L/56% 2.24L/63% 87/112%    Dx with afluttter on ekg   Screen failed due to hx of stones  03/26/2015        No desat, PK HR 130         OV 03/26/2015  Chief Complaint  Patient presents with  . Follow-up    [name], dry cough. CATH procedure on 04/08/15. Denies any chest tightnes/congestion.    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 He is on Ofev after having failed Esbriet in the past due to side effects. He is tolerating Ofev well. Liver function test in March 2016 was normal. I last saw him in January 2016. Subsequently in February 2016 we screened him for a cough idiopathic pulmonary fibrosis study called afferent but he screen failed due to history of renal stones. At this time he was incidentally diagnosed with new onset atrial flutter. He has seen Dr. GCrissie Sicklesand has been started on anticoagulation with Xarelto and metoprolol. He is due to undergo a ablation. He is not aware of the increased bleeding risk with Ofev in the setting of anticoagulation. He assures me that the anti-coag and is only until he undergoes ablation and after that the plan is to stop it. Therefore only short-term anticoagulation with Xarelto. Overall his effort tolerance is fine. He has postponed his [facility] lung transplant until he completes his ablation.  Walking desaturation test in the office today he did not desaturate. This is stable. Spirometry not done   Past medical history: Atrial flutter new diagnoses as above     Current  outpatient prescriptions:  .  diphenhydrAMINE (BENADRYL) 25 MG tablet, Take 25 mg by mouth every 6 (six) hours as needed for allergies or sleep., Disp: , Rfl:  .  metoprolol tartrate (LOPRESSOR) 25 MG tablet, Take 1 tablet (25 mg total) by mouth daily., Disp: 30 tablet, Rfl: 1 .  Nintedanib Esylate (OFEV) 150 MG CAPS, Take 1 tablet by mouth 2 (two) times daily., Disp: 60 capsule, Rfl: 0 .  pantoprazole (PROTONIX) 40 MG tablet, Take 1 tablet (40 mg total) by mouth 2 (two) times daily., Disp: 180 tablet, Rfl: 3 .  ranitidine (ZANTAC) 300 MG tablet, TAKE 1 TABLET BY MOUTH AT BEDTIME, Disp: 90 tablet, Rfl: 3 .  rivaroxaban (XARELTO) 20 MG TABS tablet, Take 1 tablet (20 mg total) by mouth daily with supper., Disp: 30 tablet, Rfl: 0 .  zolpidem (AMBIEN) 5 MG tablet, Take 1 tablet (5 mg total) by mouth at bedtime as needed for sleep., Disp: 30 tablet, Rfl: 5    Review of Systems  Constitutional: Negative for fever and unexpected weight change.  HENT: Positive for ear pain and postnasal drip. Negative for congestion, dental problem, nosebleeds, rhinorrhea, sinus pressure, sneezing, sore throat and trouble swallowing.   Eyes: Negative for redness and  itching.  Respiratory: Positive for shortness of breath. Negative for cough, chest tightness and wheezing.   Cardiovascular: Negative for palpitations and leg swelling.  Gastrointestinal: Negative for nausea and vomiting.  Genitourinary: Negative for dysuria.  Musculoskeletal: Negative for joint swelling.  Skin: Negative for rash.  Neurological: Negative for headaches.  Hematological: Does not bruise/bleed easily.  Psychiatric/Behavioral: Negative for dysphoric mood. The patient is not nervous/anxious.        Objective:   Physical Exam  Constitutional: He is oriented to person, place, and time. He appears well-developed and well-nourished. No distress.  HENT:  Head: Normocephalic and atraumatic.  Right Ear: External ear normal.  Left Ear:  External ear normal.  Mouth/Throat: Oropharynx is clear and moist. No oropharyngeal exudate.  Eyes: Conjunctivae and EOM are normal. Pupils are equal, round, and reactive to light. Right eye exhibits no discharge. Left eye exhibits no discharge. No scleral icterus.  Neck: Normal range of motion. Neck supple. No JVD present. No tracheal deviation present. No thyromegaly present.  Cardiovascular: Normal rate, regular rhythm and intact distal pulses.  Exam reveals no gallop and no friction rub.   No murmur heard. Pulmonary/Chest: Effort normal. No respiratory distress. He has no wheezes. He has rales. He exhibits no tenderness.  Abdominal: Soft. Bowel sounds are normal. He exhibits no distension and no mass. There is no tenderness. There is no rebound and no guarding.  Musculoskeletal: Normal range of motion. He exhibits no edema or tenderness.  Lymphadenopathy:    He has no cervical adenopathy.  Neurological: He is alert and oriented to person, place, and time. He has normal reflexes. No cranial nerve deficit. Coordination normal.  Skin: Skin is warm and dry. No rash noted. He is not diaphoretic. No erythema. No pallor.  Psychiatric: He has a normal mood and affect. His behavior is normal. Judgment and thought content normal.  Nursing note and vitals reviewed.   Filed Vitals:   03/26/15 0921  BP: 106/64  Pulse: 59  Height: _0  (1.753 m)  Weight: 175 lb 12.8 oz (79.742 kg)  SpO2: 98%         Assessment & Plan:     ICD-9-CM ICD-10-CM   1. IPF (idiopathic pulmonary fibrosis) 516.31 J84.112 Spirometry with graph   #IPF CLinically stable compared to last visit Glad you are tolerating OFEV well WE discussed increased bleeding risk with Ofev esp in situation of Xarelto  - because plan is to come off xarelto in a few weeks ok to continue both Ofev and Xarelto but watch out for bleeding  LFT was normal in march 2016; so no blood work 03/26/2015 Continue  Ofev 160m po twice daily;  through [place] Continue acid reflux treament Continue  generic fluticasone inhaler 2 squirts each nostril daily  #A Flutter  #Followup Call uKoreain 1 months if Dr TLovena Lestill feels you should remain on Xarelto Repeat LFT, BMET and CBC  in 3 months Do spirometry with CMA in office (not TJune Leap in 3 monts Do walk test in office (not TJune Leap  in 3 months REturn to see me  In 3 months   Dr. MBrand Males M.D., FFlorence Hospital At AnthemC.P Pulmonary and Critical Care Medicine Staff Physician CMid[facility]alePulmonary and Critical Care Pager: [phone] If no answer or between  15:00h - 7:00h: call [phone]  03/26/2015 10:12 AM

## 2015-03-26 NOTE — Telephone Encounter (Signed)
Hi Tanner Bishop is a black box warning for anticoagulants with his IPF drug Ofev. Risk is low but given new drug status of Ofev it is emphasized. Patient Tanner Bishop tells me [name] is ony for short term, if so I will continue Ofev for now. Please advise  Thanks  Dr. Brand Males, M.D., [facility].C.P Pulmonary and Critical Care Medicine Staff Physician [place] [facility] Pager: [phone], If no answer or between  15:00h - 7:00h: call [phone]  03/26/2015 10:10 AM

## 2015-03-26 NOTE — Patient Instructions (Addendum)
#  IPF CLinically stable compared to last visit Glad you are tolerating OFEV well WE discussed increased bleeding risk with Ofev esp in situation of Xarelto  - because plan is to come off xarelto in a few weeks ok to continue both Ofev and Xarelto  LFT was normal in march 2016; so no blood work 03/26/2015 Continue  Ofev 138m po twice daily; [facility] Continue acid reflux treament Continue  generic fluticasone inhaler 2 squirts each nostril daily  #A Flutter  #Followup Call uKoreain 1 months if Dr TLovena Lestill feels you should remain on Xarelto Repeat LFT, BMET and CBC  in 3 months Do spirometry with CMA in office (not TJune Leap in 3 monts Do walk test in office (not TJune Leap  in 3 months REturn to see me  In 3 months

## 2015-04-01 ENCOUNTER — Telehealth: Payer: Self-pay

## 2015-04-01 ENCOUNTER — Other Ambulatory Visit (INDEPENDENT_AMBULATORY_CARE_PROVIDER_SITE_OTHER): Payer: Commercial Managed Care - HMO | Admitting: *Deleted

## 2015-04-01 DIAGNOSIS — I4892 Unspecified atrial flutter: Secondary | ICD-10-CM

## 2015-04-01 LAB — CBC WITH DIFFERENTIAL/PLATELET
Basophils Absolute: 0 10*3/uL (ref 0.0–0.1)
Basophils Relative: 0.4 % (ref 0.0–3.0)
Eosinophils Absolute: 0.4 10*3/uL (ref 0.0–0.7)
Eosinophils Relative: 3.4 % (ref 0.0–5.0)
HCT: 46.8 % (ref 39.0–52.0)
Hemoglobin: 15.5 g/dL (ref 13.0–17.0)
Lymphocytes Relative: 27.9 % (ref 12.0–46.0)
Lymphs Abs: 3.2 10*3/uL (ref 0.7–4.0)
MCHC: 33.2 g/dL (ref 30.0–36.0)
MCV: 95.1 fl (ref 78.0–100.0)
Monocytes Absolute: 1 10*3/uL (ref 0.1–1.0)
Monocytes Relative: 8.9 % (ref 3.0–12.0)
Neutro Abs: 6.7 10*3/uL (ref 1.4–7.7)
Neutrophils Relative %: 59.4 % (ref 43.0–77.0)
Platelets: 308 10*3/uL (ref 150.0–400.0)
RBC: 4.92 Mil/uL (ref 4.22–5.81)
RDW: 14.6 % (ref 11.5–15.5)
WBC: 11.3 10*3/uL — ABNORMAL HIGH (ref 4.0–10.5)

## 2015-04-01 LAB — BASIC METABOLIC PANEL
BUN: 15 mg/dL (ref 6–23)
CO2: 31 mEq/L (ref 19–32)
Calcium: 9.6 mg/dL (ref 8.4–10.5)
Chloride: 101 mEq/L (ref 96–112)
Creatinine, Ser: 1.12 mg/dL (ref 0.40–1.50)
GFR: 69.33 mL/min (ref >60.00)
Glucose, Bld: 70 mg/dL (ref 70–99)
Potassium: 4.1 mEq/L (ref 3.5–5.1)
Sodium: 136 mEq/L (ref 135–145)

## 2015-04-01 NOTE — Telephone Encounter (Signed)
Patient came to office for Labs and asked for samples gave them to him to front

## 2015-04-02 NOTE — Telephone Encounter (Signed)
He will likely only be on the anti-coagulation 6 weeks total. I would continue unless he start bleeding. GT

## 2015-04-03 NOTE — Telephone Encounter (Signed)
Perfect. That is fine. Closing note. No need to reply

## 2015-04-08 ENCOUNTER — Encounter (HOSPITAL_COMMUNITY)
Admission: RE | Disposition: A | Discharge status: Home / Self Care | Payer: Self-pay | Source: Ambulatory Visit | Attending: Internal Medicine

## 2015-04-08 ENCOUNTER — Encounter (HOSPITAL_COMMUNITY): Payer: Self-pay | Admitting: Nurse Practitioner

## 2015-04-08 ENCOUNTER — Ambulatory Visit (HOSPITAL_COMMUNITY)
Admission: RE | Admit: 2015-04-08 | Discharge: 2015-04-09 | Disposition: A | Discharge status: Home / Self Care | Payer: Commercial Managed Care - HMO | Source: Ambulatory Visit | Attending: Internal Medicine | Admitting: Internal Medicine

## 2015-04-08 DIAGNOSIS — I483 Typical atrial flutter: Secondary | ICD-10-CM | POA: Diagnosis not present

## 2015-04-08 DIAGNOSIS — J8410 Pulmonary fibrosis, unspecified: Secondary | ICD-10-CM | POA: Insufficient documentation

## 2015-04-08 DIAGNOSIS — E785 Hyperlipidemia, unspecified: Secondary | ICD-10-CM | POA: Insufficient documentation

## 2015-04-08 DIAGNOSIS — Z87891 Personal history of nicotine dependence: Secondary | ICD-10-CM | POA: Diagnosis not present

## 2015-04-08 DIAGNOSIS — K219 Gastro-esophageal reflux disease without esophagitis: Secondary | ICD-10-CM | POA: Diagnosis not present

## 2015-04-08 DIAGNOSIS — I4892 Unspecified atrial flutter: Secondary | ICD-10-CM | POA: Diagnosis present

## 2015-04-08 HISTORY — PX: ATRIAL FLUTTER ABLATION: SHX5733

## 2015-04-08 SURGERY — ATRIAL FLUTTER ABLATION
Anesthesia: LOCAL

## 2015-04-08 MED ORDER — ONDANSETRON HCL 4 MG/2ML IJ SOLN: 4.0000 mg | Freq: Four times a day (QID) | INTRAMUSCULAR | Status: DC | PRN

## 2015-04-08 MED ORDER — SODIUM CHLORIDE 0.9 % IJ SOLN: 3.0000 mL | INTRAMUSCULAR | Status: DC | PRN

## 2015-04-08 MED ORDER — BUPIVACAINE HCL (PF) 0.25 % IJ SOLN
INTRAMUSCULAR | Status: AC
  Filled 2015-04-08: qty 30

## 2015-04-08 MED ORDER — ACETAMINOPHEN 325 MG PO TABS: 650.0000 mg | ORAL_TABLET | ORAL | Status: DC | PRN

## 2015-04-08 MED ORDER — PANTOPRAZOLE SODIUM 40 MG PO TBEC
40.0000 mg | DELAYED_RELEASE_TABLET | Freq: Two times a day (BID) | ORAL | Status: DC
  Administered 2015-04-08 – 2015-04-09 (×2): 40 mg via ORAL
  Filled 2015-04-08 (×2): qty 1

## 2015-04-08 MED ORDER — SODIUM CHLORIDE 0.9 % IJ SOLN
3.0000 mL | Freq: Two times a day (BID) | INTRAMUSCULAR | Status: DC
  Administered 2015-04-08: 3 mL via INTRAVENOUS

## 2015-04-08 MED ORDER — SODIUM CHLORIDE 0.9 % IV SOLN
INTRAVENOUS | Status: DC
  Administered 2015-04-08: 11:00:00 via INTRAVENOUS

## 2015-04-08 MED ORDER — METOPROLOL TARTRATE 25 MG PO TABS
25.0000 mg | ORAL_TABLET | Freq: Every day | ORAL | Status: DC
  Administered 2015-04-08 – 2015-04-09 (×2): 25 mg via ORAL
  Filled 2015-04-08 (×2): qty 1

## 2015-04-08 MED ORDER — FENTANYL CITRATE (PF) 100 MCG/2ML IJ SOLN
INTRAMUSCULAR | Status: AC
  Filled 2015-04-08: qty 2

## 2015-04-08 MED ORDER — RIVAROXABAN 20 MG PO TABS
20.0000 mg | ORAL_TABLET | Freq: Every day | ORAL | Status: DC
  Administered 2015-04-08: 20 mg via ORAL
  Filled 2015-04-08: qty 1

## 2015-04-08 MED ORDER — NINTEDANIB ESYLATE 150 MG PO CAPS
150.0000 mg | ORAL_CAPSULE | Freq: Two times a day (BID) | ORAL | Status: DC
  Administered 2015-04-09: 150 mg via ORAL
  Filled 2015-04-08: qty 1

## 2015-04-08 MED ORDER — SODIUM CHLORIDE 0.9 % IV SOLN: 250.0000 mL | INTRAVENOUS | Status: DC | PRN

## 2015-04-08 MED ORDER — MIDAZOLAM HCL 5 MG/5ML IJ SOLN
INTRAMUSCULAR | Status: AC
  Filled 2015-04-08: qty 5

## 2015-04-08 MED ORDER — ZOLPIDEM TARTRATE 5 MG PO TABS
5.0000 mg | ORAL_TABLET | Freq: Every evening | ORAL | Status: DC | PRN
  Administered 2015-04-08: 5 mg via ORAL
  Filled 2015-04-08: qty 1

## 2015-04-08 MED ORDER — NINTEDANIB ESYLATE 150 MG PO CAPS
150.0000 mg | ORAL_CAPSULE | Freq: Two times a day (BID) | ORAL | Status: DC
  Administered 2015-04-08: 150 mg via ORAL
  Filled 2015-04-08: qty 1

## 2015-04-08 NOTE — CV Procedure (Signed)
SURGEON: Cristopher Peru, MD   PREPROCEDURE DIAGNOSIS: Typical Atrial flutter.   POSTPROCEDURE DIAGNOSIS: Typical Atrial flutter.   PROCEDURES:  1. Comprehensive electrophysiology study.  2. Coronary sinus pacing and recording.  3. Mapping of atrial flutter.  5. Ablation of atrial flutter.  6. Arrhythmia induction with pacing.   INTRODUCTION: Tanner Bishop is a 69 y.o. male with symptomatic typical atrial flutter. He presents today for EP study and radiofrequency ablation.   DESCRIPTION OF PROCEDURE: Informed written consent was obtained, and the patient was brought to the Electrophysiology Lab in the fasting state. He was sedated with IV Versed and Fentanyl. The patient's right groin and neck was prepped and draped in the usual sterile fashion by the EP lab staff. Using a modified Seldinger technique, one 6, and two 8-French hemostasis sheaths were placed in the right common femoral vein. A 7-French hemostasis sheath was placed in the right internal jugular vein. A 6-French hexapolar catheter was placed into the coronary sinus via the right internal jugular vein. A 6-French quadripolar Josephson catheter was introduced through the right common femoral vein and advanced into the right ventricle for recording and pacing, but this catheter was then pulled back to the His bundle location.   Initial Measurements: The patient presented to the electrophysiology lab in atrial flutter. The surface electrocardiogram was consistent with typical atrial flutter.  The atrial flutter cycle length was 225 milliseconds.  The coronary sinus catheter activation revealed proximal to distal activation and was therefore suggestive of right atrial flutter.  The patient's QRS duration was 96 milliseconds with a QT interval of 475 milliseconds and an HV interval of 36 milliseconds.   Entrainment and Mapping: Entrainment was performed from the left atrium, which revealed a long postpacing interval.  A Psychiatrist II XB 8-mm ablation catheter was introduced through the right common femoral vein and advanced into the right atrium.  The catheter was positioned along the cavotricuspid isthmus.  Entrainment mapping was performed from the cavotricuspid isthmus.  The postpacing interval was equal to the tachycardia cycle length when pacing in this location during entrainment.  The patient was therefore felt to have isthmus-dependent right atrial flutter.  Mapping was performed along the atrial side of the cavotricuspid isthmus.  This demonstrated a moderate-sized isthmus.     Ablation:  The ablation catheter was therefore positioned along the cavotricuspid isthmus and a series of radiofrequency applications were delivered with a target temperature of 60 degrees of 50 watts for 120 seconds each.  The tachycardia slowed and then terminated during radiofrequency application.  Additional mapping of the atrial signal was performed with  additional ablation performed.  Following bonus radiofrequency applications, complete bidirectional isthmus block was achieved as evident by differential atrial pacing from the low lateral right atrium.  The patient was observed for 20 minutes without return of conduction through the cavotricuspid isthmus.    Measurements Post ablation: Following ablation, the stimulus to earliest atrial activation recorded across the isthmus line measured 200 milliseconds.  The AH interval measured 69 milliseconds with an HV interval of 30 milliseconds.  Atrial pacing was performed, which revealed decremental AV conduction with no evidence of PR greater than RR.  The AV Wenckebach cycle length was 340 milliseconds.  Atrial pacing was continued down to a cycle length of 290 milliseconds with no arrhythmias induced.  Ventricular pacing was performed, which revealed midline decremental VA conduction with a retrograde VA block of 300 milliseconds.  No arrhythmias were induced. The procedure was therefore  considered completed.  All catheters were removed and the sheaths were aspirated and flushed.  The sheaths were removed and hemostasis was assured.  There were no early apparent complications.    CONCLUSIONS:  1. Isthmus-dependent right atrial flutter upon presentation.  2. Successful radiofrequency ablation of atrial flutter along the cavotricuspid isthmus with complete bidirectional isthmus block achieved with 5 RF energy applications over 3.7 min. 3. No inducible arrhythmias following ablation.  4. No early apparent complications.   Cristopher Peru, MD  1:28 PM 04/08/2015

## 2015-04-08 NOTE — Progress Notes (Signed)
Spoke with Tanner Bishop and his family to advise them procedure is delayed.   Gave him a small amount of water and will add IVF at 50 cc/hr.  Rosaria Ferries, PA-C 04/08/2015 11:06 AM Beeper [phone]

## 2015-04-08 NOTE — Progress Notes (Signed)
Site area: Right Internal Jugular a 7 french venous sheath was removed  Site Prior to Removal:  Level 0  Pressure Applied For 10 MINUTES    Minutes Beginning at 1340p  Manual:   Yes.    Patient Status During Pull:  stable  Post Pull Groin Site:  Level 0  Post Pull Instructions Given:  Yes.    Post Pull Pulses Present:  Yes.    Dressing Applied:  Yes.    Comments:  Pt VS remain stable during sheath pull.  Pt denies any discomfort at site at this time

## 2015-04-08 NOTE — Progress Notes (Addendum)
Site area: rt groin Site Prior to Removal:  Level  0 Pressure Applied For:  10 minutes Manual:   yes Patient Status During Pull:  stable Post Pull Site:  Level  0 Post Pull Instructions Given:  yes Post Pull Pulses Present: yes Dressing Applied:  small tegaderm Bedrest begins @ 2761 Comments: none. IV saline locked.

## 2015-04-08 NOTE — H&P (Signed)
HPI Tanner Bishop is referred today by Dr. Purnell Shoemaker for evaluation of atrial flutter. He has a h/o IPF but has not required oxygen therapy. He was treated with metoprolol for rate control but did not get started on anti-coagulation. He does not feel palpitations. He thinks he may have some fatigue and gets tired more easily.  No Known Allergies   Current Outpatient Prescriptions  Medication Sig Dispense Refill  . diphenhydrAMINE (BENADRYL) 25 MG tablet Take 25 mg by mouth every 6 (six) hours as needed for allergies or sleep.    . metoprolol tartrate (LOPRESSOR) 25 MG tablet Take 1 tablet (25 mg total) by mouth daily. 30 tablet 1  . Nintedanib Esylate (OFEV) 150 MG CAPS Take 1 tablet by mouth 2 (two) times daily. 60 capsule 0  . pantoprazole (PROTONIX) 40 MG tablet Take 1 tablet (40 mg total) by mouth 2 (two) times daily. 180 tablet 3  . ranitidine (ZANTAC) 300 MG tablet TAKE 1 TABLET BY MOUTH AT BEDTIME 90 tablet 3  . zolpidem (AMBIEN) 5 MG tablet Take 1 tablet (5 mg total) by mouth at bedtime as needed for sleep. 30 tablet 5  . rivaroxaban (XARELTO) 20 MG TABS tablet Take 1 tablet (20 mg total) by mouth daily with supper. 30 tablet 0   No current facility-administered medications for this visit.     Past Medical History  Diagnosis Date  . Ulcer     unresolved  . GERD (gastroesophageal reflux disease)   . Injury of right hand     permanent damage after workplace 2009 and 2010 Dr [facility]ona [facility]  . Arthritis   . Pulmonary fibrosis     sees Dr. Onnie Graham   . Renal stone 06/2014  . H/O hiatal hernia   . Hyperlipidemia   . Atrial flutter     sees Dr. Loralie Champagne     ROS:  All systems reviewed and negative except as noted in the HPI.   Past Surgical History  Procedure Laterality Date  . Colonoscopy  02-24-12    adenomatous polyps removed, repeat in  5 yrs, per Dr. Deatra Ina   . Esophagogastroduodenoscopy  2007  . Hand surgery Right   . Video assisted thoracoscopy Left 03/22/2013    Procedure: VIDEO ASSISTED THORACOSCOPY; Surgeon: Melrose Nakayama, MD; Location: [place]; Service: Thoracic; Laterality: Left;  . Lung biopsy Left 03/22/2013    Procedure: LUNG BIOPSY; Surgeon: Melrose Nakayama, MD; Location: [place]; Service: Thoracic; Laterality: Left;  . Extracorporeal shock wave lithotripsy  06-17-14    per Dr. Jeffie Pollock      Family History  Problem Relation Age of Onset  . Diabetes    . Cancer      prostate  . Liver cancer Mother   . Diabetes Sister   . Lung cancer Sister   . Heart disease Brother   . Diabetes Brother   . Colon cancer Brother      History   Social History  . Marital Status: Married    Spouse Name: N/A  . Number of Children: N/A  . Years of Education: N/A   Occupational History  . LABORER   . Disabled     Social History Main Topics  . Smoking status: Former Smoker -- 1.00 packs/day for 16 years    Types: Cigarettes    Quit date: 12/14/1978  . Smokeless tobacco: Never Used  . Alcohol Use: No  . Drug Use: No  . Sexual Activity:  Not on file   Other Topics Concern  . Not on file   Social History Narrative     BP 128/72 mmHg  Pulse 106  Ht _0  (1.753 m)  Wt 172 lb (78.019 kg)  BMI 25.39 kg/m2  Physical Exam:  Well appearing 69 yo man, NAD HEENT: Unremarkable Neck: No JVD, no thyromegally Lymphatics: No adenopathy Back: No CVA tenderness Lungs: Clear with no wheezes HEART: IRegular tachy rate and rhythm, no murmurs, no rubs, no clicks Abd: soft, positive bowel sounds, no organomegally, no rebound, no guarding Ext: 2 plus pulses, no edema, no cyanosis, no clubbing Skin: No rashes no nodules Neuro: CN II through XII intact, motor grossly  intact  EKG - atrial flutter with a RVR  Assess/Plan:            Atrial flutter, unspecified - Evans Lance, MD at 03/07/2015 5:48 PM     Status: Written Related Problem: Atrial flutter, unspecified   Expand All Collapse All   He has a rapid ventricular rate. He is on a beta blocker. I have discussed the treatment options. Will schedule catheter ablation of atrial flutter after he has had 3-4 weeks of systemic anti-coagulation.             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 Will follow.            Hyperlipidemia - Evans Lance, MD at 03/07/2015 5:49 PM     Status: Alison Stalling Related Problem: Hyperlipidemia   Expand All Collapse All   He will maintain a low fat diet       [name].D.

## 2015-04-08 NOTE — Discharge Summary (Signed)
ELECTROPHYSIOLOGY PROCEDURE DISCHARGE SUMMARY    Patient ID: Tanner Bishop,  MRN: [id number], DOB/AGE: Oct 26, 1946 69 y.o.  Admit date: 04/08/2015 Discharge date: 04/09/2015  Primary Care Physician: [name], MD Electrophysiologist: Allred  Primary Discharge Diagnosis:  Atrial flutter status post ablation this admission  Secondary Discharge Diagnosis:  1.  Pulmonary fibrosis 2.  Hyperlipidemia 3.  GERD  No Known Allergies   Procedures This Admission: 1.  Electrophysiology study and radiofrequency catheter ablation on 04/08/15 by Dr Lovena Le.  This study demonstrated typical atrial flutter with successful CTI ablation.  There were no inducible arrhythmias following ablation and no early apparent complications.   Brief HPI: Tanner Bishop is a 69 y.o. male with a past medical history as outlined above. He has documented atrial flutter. He has been appropriately anticoagulated for >4 weeks.  Risks, benefits, and alternatives to ablation were reviewed with the patient who wished to proceed.   Hospital Course:  The patient was admitted and underwent EPS/RFCA with details as outlined above. He was monitored on telemetry overnight which demonstrated sinus rhythm.  Groin and neck incisions were without complication.  They were examined by Dr Lovena Le who considered them stable for discharge to home.  Follow up will be arranged in 4 weeks.  Wound care and restrictions were reviewed with the patient prior to discharge.   CHADS2VASC score is 1 (age).  Physical Exam: Filed Vitals:   04/08/15 1549 04/08/15 1604 04/08/15 2039 04/09/15 0538  BP: 103/61 109/63 111/69 101/66  Pulse:   73 70  Temp:   98.3 F (36.8 C) 98.4 F (36.9 C)  TempSrc:   Oral Oral  Resp: 19 20    Height:      Weight:    172 lb 6.4 oz (78.2 kg)  SpO2:   96% 98%    GEN- The patient is well appearing, alert and oriented x 3 today.   HEENT: normocephalic, atraumatic; sclera clear, conjunctiva pink; hearing  intact; oropharynx clear; neck supple, no JVP Lymph- no cervical lymphadenopathy Lungs- Clear to ausculation bilaterally, normal work of breathing.  No wheezes, rales, rhonchi Heart- Regular rate and rhythm, no murmurs, rubs or gallops  GI- soft, non-tender, non-distended, bowel sounds present  Extremities- no clubbing, cyanosis, or edema; DP/PT/radial pulses 2+ bilaterally, groin without hematoma/bruit MS- no significant deformity or atrophy Skin- warm and dry, no rash or lesion Psych- euthymic mood, full affect Neuro- strength and sensation are intact   Labs:   Lab Results  Component Value Date   WBC 11.3* 04/01/2015   HGB 15.5 04/01/2015   HCT 46.8 04/01/2015   MCV 95.1 04/01/2015   PLT 308.0 04/01/2015   No results for input(s): NA, K, CL, CO2, BUN, CREATININE, CALCIUM, PROT, BILITOT, ALKPHOS, ALT, AST, GLUCOSE in the last 168 hours.  Invalid input(s): LABALBU  Discharge Medications:    Medication List    TAKE these medications        diphenhydrAMINE 25 MG tablet  Commonly known as:  BENADRYL  Take 25 mg by mouth every 6 (six) hours as needed for allergies or sleep.     metoprolol tartrate 25 MG tablet  Commonly known as:  LOPRESSOR  Take 1 tablet (25 mg total) by mouth daily.     Nintedanib Esylate 150 MG Caps  Commonly known as:  OFEV  Take 1 tablet by mouth 2 (two) times daily.     pantoprazole 40 MG tablet  Commonly known as:  PROTONIX  Take 1 tablet (40 mg  total) by mouth 2 (two) times daily.     ranitidine 300 MG tablet  Commonly known as:  ZANTAC  TAKE 1 TABLET BY MOUTH AT BEDTIME     rivaroxaban 20 MG Tabs tablet  Commonly known as:  XARELTO  Take 1 tablet (20 mg total) by mouth daily with supper. Stop Xarelto on 04/22/15     zolpidem 5 MG tablet  Commonly known as:  AMBIEN  Take 1 tablet (5 mg total) by mouth at bedtime as needed for sleep.        Disposition:  Discharge Instructions    Diet - low sodium heart healthy    Complete by:  As  directed      Increase activity slowly    Complete by:  As directed           Follow-up Information    Follow up with Cristopher Peru, MD On 05/07/2015.   Specialty:  Cardiology   Why:  at 11:45AM   Contact information:   [address] [address] [phone]       Duration of Discharge Encounter: Greater than 30 minutes including physician time.  Signed, Chanetta Marshall, NP 04/09/2015 7:53 AM    EP Attending  Patient seen and examined. Agree with above. The patient was on metoprolol only for his rate control in atrial flutter. Will stop metoprolol and stop Eliquis after 2 weeks.   [name].D.

## 2015-04-08 NOTE — Interval H&P Note (Signed)
History and Physical Interval Note:  04/08/2015 12:01 PM  Tanner Bishop  has presented today for surgery, with the diagnosis of atrial flutter  The various methods of treatment have been discussed with the patient and family. After consideration of risks, benefits and other options for treatment, the patient has consented to  Procedure(s): ATRIAL FLUTTER ABLATION (N/A) as a surgical intervention .  The patient's history has been reviewed, patient examined, no change in status, stable for surgery.  I have reviewed the patient's chart and labs.  Questions were answered to the patient's satisfaction.     [name].D.

## 2015-04-09 DIAGNOSIS — I483 Typical atrial flutter: Secondary | ICD-10-CM

## 2015-04-09 DIAGNOSIS — E785 Hyperlipidemia, unspecified: Secondary | ICD-10-CM | POA: Diagnosis not present

## 2015-04-09 DIAGNOSIS — K219 Gastro-esophageal reflux disease without esophagitis: Secondary | ICD-10-CM | POA: Diagnosis not present

## 2015-04-09 DIAGNOSIS — Z87891 Personal history of nicotine dependence: Secondary | ICD-10-CM | POA: Diagnosis not present

## 2015-04-09 MED ORDER — RIVAROXABAN 20 MG PO TABS: 20.0000 mg | ORAL_TABLET | Freq: Every day | ORAL | Status: DC

## 2015-04-09 NOTE — Discharge Instructions (Addendum)
No driving for 4 days. No lifting over 5 lbs for 1 week. No sexual activity for 1 week. You may return to work in 1 week. Keep procedure site clean & dry. If you notice increased pain, swelling, bleeding or pus, call/return!  You may shower, but no soaking baths/hot tubs/pools for 1 week.

## 2015-05-07 ENCOUNTER — Ambulatory Visit (INDEPENDENT_AMBULATORY_CARE_PROVIDER_SITE_OTHER): Payer: Commercial Managed Care - HMO | Admitting: Internal Medicine

## 2015-05-07 ENCOUNTER — Encounter: Payer: Self-pay | Admitting: Internal Medicine

## 2015-05-07 VITALS — BP 118/74 | HR 70 | Ht 69.0 in | Wt 172.4 lb

## 2015-05-07 DIAGNOSIS — J84112 Idiopathic pulmonary fibrosis: Secondary | ICD-10-CM

## 2015-05-07 DIAGNOSIS — I483 Typical atrial flutter: Secondary | ICD-10-CM

## 2015-05-07 NOTE — Progress Notes (Signed)
HPI [name] returns after catheter ablation of atrial flutter. He has a h/o IPF but has not required oxygen therapy. He underwent ablation approx 6 weeks ago and feels much better. He denies palpitations.  No Known Allergies   Current Outpatient Prescriptions  Medication Sig Dispense Refill  . diphenhydrAMINE (BENADRYL) 25 MG tablet Take 25 mg by mouth every 6 (six) hours as needed for allergies or sleep.    . Nintedanib Esylate (OFEV) 150 MG CAPS Take 1 tablet by mouth 2 (two) times daily. 60 capsule 0  . pantoprazole (PROTONIX) 40 MG tablet Take 1 tablet (40 mg total) by mouth 2 (two) times daily. 180 tablet 3  . ranitidine (ZANTAC) 300 MG tablet TAKE 1 TABLET BY MOUTH AT BEDTIME 90 tablet 3  . zolpidem (AMBIEN) 5 MG tablet Take 1 tablet (5 mg total) by mouth at bedtime as needed for sleep. 30 tablet 5   No current facility-administered medications for this visit.     Past Medical History  Diagnosis Date  . Ulcer     unresolved  . GERD (gastroesophageal reflux disease)   . Injury of right hand     permanent damage after workplace 2009 and 2010 Dr Vernona [facility]  . Arthritis   . Pulmonary fibrosis     sees Dr. Onnie Graham   . Renal stone 06/2014  . H/O hiatal hernia   . Hyperlipidemia   . Atrial flutter     a. s/p ablation 03/2015 Dr Lovena Le    ROS:   All systems reviewed and negative except as noted in the HPI.   Past Surgical History  Procedure Laterality Date  . Colonoscopy  02-24-12    adenomatous polyps removed, repeat in 5 yrs, per Dr. Deatra Ina   . Esophagogastroduodenoscopy  2007  . Hand surgery Right   . Video assisted thoracoscopy Left 03/22/2013    Procedure: VIDEO ASSISTED THORACOSCOPY;  Surgeon: Melrose Nakayama, MD;  Location: [facility];  Service: Thoracic;  Laterality: Left;  . Lung biopsy Left 03/22/2013    Procedure: LUNG BIOPSY;  Surgeon: Melrose Nakayama, MD;  Location: [facility];  Service: Thoracic;  Laterality: Left;  .  Extracorporeal shock wave lithotripsy  06-17-14    per Dr. Jeffie Pollock   . Atrial flutter ablation N/A 04/08/2015    Procedure: ATRIAL FLUTTER ABLATION;  Surgeon: Evans Lance, MD;  Location: [facility] CATH LAB;  Service: Cardiovascular;  Laterality: N/A;     Family History  Problem Relation Age of Onset  . Diabetes    . Cancer      prostate  . Liver cancer Mother   . Diabetes Sister   . Lung cancer Sister   . Heart disease Brother   . Diabetes Brother   . Colon cancer Brother      History   Social History  . Marital Status: Married    Spouse Name: N/A  . Number of Children: N/A  . Years of Education: N/A   Occupational History  . LABORER   . Disabled     Social History Main Topics  . Smoking status: Former Smoker -- 1.00 packs/day for 16 years    Types: Cigarettes    Quit date: 12/14/1978  . Smokeless tobacco: Never Used  . Alcohol Use: No  . Drug Use: No  . Sexual Activity: Not on file   Other Topics Concern  . Not on file   Social History Narrative     BP 118/74  mmHg  Pulse 70  Ht _0  (1.753 m)  Wt 172 lb 6.4 oz (78.2 kg)  BMI 25.45 kg/m2  Physical Exam:  Well appearing 69 yo man, NAD HEENT: Unremarkable Neck:  No JVD, no thyromegally Lymphatics:  No adenopathy Back:  No CVA tenderness Lungs: scattered fine pitched rales HEART:  Regular rate and rhythm, no murmurs, no rubs, no clicks Abd:  soft, positive bowel sounds, no organomegally, no rebound, no guarding Ext:  2 plus pulses, no edema, no cyanosis, no clubbing Skin:  No rashes no nodules Neuro:  CN II through XII intact, motor grossly intact  EKG - nsr  Assess/Plan:

## 2015-05-07 NOTE — Assessment & Plan Note (Signed)
He is s/p catheter ablation and doing well, maintaining NSR. Will follow up as needed. He is encouraged to return to his normal acitivity. He will remain off of systemic anti-coagulation

## 2015-05-07 NOTE — Patient Instructions (Signed)
Medication Instructions:  Your physician recommends that you continue on your current medications as directed. Please refer to the Current Medication list given to you today.   Labwork: NONE  Testing/Procedures: NONE  Follow-Up: Follow up with [name] as needed.  Any Other Special Instructions Will Be Listed Below (If Applicable).

## 2015-05-07 NOTE — Assessment & Plan Note (Signed)
He appears stable and is not on oxygen. He will follow up with Dr. Alfonso Patten.

## 2015-05-25 ENCOUNTER — Telehealth: Payer: Self-pay | Admitting: Internal Medicine

## 2015-05-25 NOTE — Telephone Encounter (Addendum)
pls give fu in august 2016  ..........  Note to self: saw [facility] mid may 2016 -they want rehab 3x/week, colonoscopy dexa scan < 12 months old, hep b, flu shot, tb skin test < 12 months, pneumonia shots, and tetanus updated  Also, vit -d  load

## 2015-05-28 NOTE — Telephone Encounter (Signed)
Pt scheduled 7/27 to see MR. Nothing further needed

## 2015-05-31 ENCOUNTER — Other Ambulatory Visit: Payer: Self-pay

## 2015-05-31 MED ORDER — NINTEDANIB ESYLATE 150 MG PO CAPS: 1.0000 | ORAL_CAPSULE | Freq: Two times a day (BID) | ORAL | Status: DC

## 2015-07-10 ENCOUNTER — Encounter: Payer: Self-pay | Admitting: Internal Medicine

## 2015-07-10 ENCOUNTER — Ambulatory Visit (INDEPENDENT_AMBULATORY_CARE_PROVIDER_SITE_OTHER): Payer: Commercial Managed Care - HMO | Admitting: Internal Medicine

## 2015-07-10 ENCOUNTER — Other Ambulatory Visit (INDEPENDENT_AMBULATORY_CARE_PROVIDER_SITE_OTHER): Payer: Commercial Managed Care - HMO

## 2015-07-10 VITALS — BP 134/82 | HR 64 | Temp 99.0°F | Ht 69.5 in | Wt 174.8 lb

## 2015-07-10 DIAGNOSIS — Z5181 Encounter for therapeutic drug level monitoring: Secondary | ICD-10-CM | POA: Diagnosis not present

## 2015-07-10 DIAGNOSIS — J018 Other acute sinusitis: Secondary | ICD-10-CM | POA: Diagnosis not present

## 2015-07-10 DIAGNOSIS — J84112 Idiopathic pulmonary fibrosis: Secondary | ICD-10-CM

## 2015-07-10 DIAGNOSIS — R21 Rash and other nonspecific skin eruption: Secondary | ICD-10-CM

## 2015-07-10 LAB — HEPATIC FUNCTION PANEL
ALT: 12 U/L (ref 0–53)
AST: 20 U/L (ref 0–37)
Albumin: 4.3 g/dL (ref 3.5–5.2)
Alkaline Phosphatase: 48 U/L (ref 39–117)
Bilirubin, Direct: 0.1 mg/dL (ref 0.0–0.3)
Total Bilirubin: 0.6 mg/dL (ref 0.2–1.2)
Total Protein: 7.9 g/dL (ref 6.0–8.3)

## 2015-07-10 MED ORDER — CEPHALEXIN 500 MG PO CAPS: 500.0000 mg | ORAL_CAPSULE | Freq: Three times a day (TID) | ORAL | Status: DC

## 2015-07-10 NOTE — Progress Notes (Signed)
Subjective:    Patient ID: Tanner Bishop, male    DOB: [birth year], 69 y.o.   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 - Biopsy proved April 2014  - On OFEV since early May 2015   PFT FVC fev1 ratio BD fev1 TLC DLCO Walk test 183f x 3 laps wt rx             Spring 2014 2.9:L L/% /%        June 2015 2.7L/67% 2.34L/78% 86  3.68/57% 20.24/71%   ofev start  [name]  2016 2.5L/55% 2.1L/60% 84/108%    No desat, Pk HR 130    02/11/2015 Screening visit afferent cough study 2.59L/56% 2.24L/63% 87/112%    Dx with afluttter on ekg   Screen failed due to hx of stones  03/26/2015        No desat, PK HR 130  [place] [facility]     25.[facility] ([facility] ga[facility]ler equateion) No desat. PK HR 115  - [place]   On ofev       OV 03/26/2015  Chief Complaint  Patient presents with  . Follow-up    [name], dry cough. CATH procedure on 04/08/15. Denies any chest tightnes/congestion.    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 He is on Ofev after having failed Esbriet in the past due to side effects. He is tolerating Ofev well. Liver function test in March 2016 was normal. I last saw him in January 2016. Subsequently in February 2016 we screened him for a cough idiopathic pulmonary fibrosis study called afferent but he screen failed due to history of renal stones. At this time he was incidentally diagnosed with new onset atrial flutter. He has seen Dr. GCrissie Sicklesand has been started on anticoagulation with Xarelto and metoprolol. He is due to undergo a ablation. He is not aware of the increased bleeding risk with Ofev in the setting of anticoagulation. He assures me that the anti-coag and is only until he undergoes ablation and after that the plan is to stop it. Therefore only short-term anticoagulation with Xarelto. Overall his effort tolerance is fine. He has postponed his [facility] lung  transplant until he completes his ablation.  Walking desaturation test in the office today he did not desaturate. This is stable. Spirometry not done   Past medical history: Atrial flutter new diagnoses as above  OV 07/10/2015  Chief Complaint  Patient presents with  . Follow-up    Coughing green mucus, runny nose, congestion, mild SOB x 1 day.   Follow-up   - Idiopathic pulmonary fibrosis. Last seen in April 2016. At that time he had atrial flutter. He was on anticoagulation Xarelto. But this was only briefly. He status post ablation and according to the review of the cardiology notes he is in sinus rhythm. This cardiology visit happened on 05/07/2015. He did see [facility] on 04/25/2059. He had pulmonary function tests which is documented above and the table. I personally reviewed and visualized this image and shows continued stability of idiopathic pulmonary fibrosis. In fact he also continues to feel stable. Walking desaturation test today he did not desaturate and his tachycardia was better than when he had atrial flutter. He is tolerating sOfev well. He is interested in research trials.   - New issue   - For  the last 1-2 days he has a stuffy nose runny nose along with yellow sinus drainage. He denies any sick contacts. He has mild sore throat. He has low-grade fever. He feels down and a little bit tired but he does not feel his IPF has flared up.   - Also he has a recurrence of old issue   - In summer 2014 when he was on Esbriet he had skin rash and after the discontinuation of the drug the skin rash is resolved. It was a malar rash. This was a known established photosensitivity with the drug. Since June 2015 he is now been on IPF drug Ofev which is not known to cause any skin reactions but he feels that his facial rash keeps recurring. In fact he reports that he's had this a third time. Current episode is lasting a few weeks. Although it is more limited compared to 2  years ago. He reports that in the left malar area and left upper eyelid. He has seen his ophthalmologist apparently is unable to explain etiology. He has never seen a dermatologist.   Allergies No Known Allergies     Review of Systems  Constitutional: Negative for fever and unexpected weight change.  HENT: Positive for congestion, rhinorrhea and sinus pressure. Negative for dental problem, ear pain, nosebleeds, postnasal drip, sneezing, sore throat and trouble swallowing.   Eyes: Negative for redness and itching.  Respiratory: Positive for cough and shortness of breath. Negative for chest tightness and wheezing.   Cardiovascular: Negative for palpitations and leg swelling.  Gastrointestinal: Negative for nausea and vomiting.  Genitourinary: Negative for dysuria.  Musculoskeletal: Negative for joint swelling.  Skin: Negative for rash.  Neurological: Negative for headaches.  Hematological: Does not bruise/bleed easily.  Psychiatric/Behavioral: Negative for dysphoric mood. The patient is not nervous/anxious.        Objective:   Physical Exam  Constitutional: He is oriented to person, place, and time. He appears well-developed and well-nourished. No distress.  HENT:  Head: Normocephalic and atraumatic.  Right Ear: External ear normal.  Left Ear: External ear normal.  Mouth/Throat: Oropharynx is clear and moist. No oropharyngeal exudate.  Nasal twang Runny nose congested  Eyes: Conjunctivae and EOM are normal. Pupils are equal, round, and reactive to light. Right eye exhibits no discharge. Left eye exhibits no discharge. No scleral icterus.  Neck: Normal range of motion. Neck supple. No JVD present. No tracheal deviation present. No thyromegaly present.  Cardiovascular: Normal rate, regular rhythm and intact distal pulses.  Exam reveals no gallop and no friction rub.   No murmur heard. Pulmonary/Chest: Effort normal and breath sounds normal. No respiratory distress. He has no  wheezes. He has no rales. He exhibits no tenderness.  Crackles half way up  Abdominal: Soft. Bowel sounds are normal. He exhibits no distension and no mass. There is no tenderness. There is no rebound and no guarding.  Musculoskeletal: Normal range of motion. He exhibits no edema or tenderness.  Lymphadenopathy:    He has no cervical adenopathy.  Neurological: He is alert and oriented to person, place, and time. He has normal reflexes. No cranial nerve deficit. Coordination normal.  Skin: Skin is warm and dry. No rash noted. He is not diaphoretic. No erythema. No pallor.  Psychiatric: He has a normal mood and affect. His behavior is normal. Judgment and thought content normal.  Nursing note and vitals reviewed.   Filed Vitals:   07/10/15 1549  BP: 134/82  Pulse: 64  Temp: 99  F (37.2 C)  TempSrc: Oral  Height: 5' 9.5" (1.765 m)  Weight: 174 lb 12.8 oz (79.289 kg)  SpO2: 94%         Assessment & Plan:     ICD-9-CM ICD-10-CM   1. IPF (idiopathic pulmonary fibrosis) 516.31 J84.112 Hepatic function panel  2. Encounter for therapeutic drug monitoring V58.83 Z51.81   3. Malar rash 782.1 R21 Ambulatory referral to Dermatology  4. Other acute sinusitis 461.8 J01.80    #IPF and durg monitoring CLinically stable compared to last visit Given sinus infection  will not do breathing est Glad you are tolerating OFEV well and glad you are off Xarelto (glad A flutter resolved) Do LFT 07/10/2015 for Ofev monitoring Continue  Ofev 153m po twice daily; [email or web address] Continue acid reflux treament Continue  generic fluticasone inhaler 2 squirts each nostril daily   #Acute Sinusitis  - new problem  - cephalexin 5058mthree times daily x 7 days  #Orbital Rash (Left) Malar Rash - In Summer 2014 ws attributed to pirfenidone and then resolved with dc of pirfenidone - Now recurred again though milder and limited to left orbit  only - Unclear if related to Ofev (no reports of rash with this drug)  [facility]; hopefully to be seen within 2 weeks  - if delays in appt, let me know  #Followup  - 3 months - walk test at followup (not 40m42m    Dr. MurBrand Males.D., F.CCox [facility] County HospitalP Pulmonary and Critical Care Medicine Staff Physician [facility] Pager: [phone] no answer or between  15:00h - 7:00h: call [phone]  07/10/2015 4:26 PM

## 2015-07-10 NOTE — Patient Instructions (Addendum)
#  IPF CLinically stable compared to last visit Given sinus infection  will not do breathing est Glad you are tolerating OFEV well and glad you are off Xarelto (glad A flutter resolved) Do LFT 07/10/2015 for Ofev monitoring Continue  Ofev 167m po twice daily; through [email or web address] in [email or web address] Continue acid reflux treament Continue  generic fluticasone inhaler 2 squirts each nostril daily   #Acute Sinusitis  - new problem  - cephalexin 5059mthree times daily x 7 days  #Orbital Rash (Left) Malar Rash - In Summer 2014 ws attributed to pirfenidone and then resolved with dc of pirfenidone - Now recurred again though milder and limited to left orbit only - Unclear if related to Ofev (no reports of rash with this drug)  [facility]; hopefully to be seen within 2 weeks  - if delays in appt, let me know  #Followup  - 3 months - walk test at followup (not 41m20m

## 2015-07-11 ENCOUNTER — Telehealth: Payer: Self-pay | Admitting: Internal Medicine

## 2015-07-11 ENCOUNTER — Telehealth: Payer: Self-pay | Admitting: Family Medicine

## 2015-07-11 MED ORDER — CEPHALEXIN 500 MG PO CAPS: 500.0000 mg | ORAL_CAPSULE | Freq: Three times a day (TID) | ORAL | Status: DC

## 2015-07-11 NOTE — Telephone Encounter (Signed)
Rodena Piety from Pulmonary called requesting a referral entered for patient to see [facility] for R21, malar rash.

## 2015-07-11 NOTE — Progress Notes (Signed)
Quick Note:  Called and spoke to pt. Informed pt of the results per MR. Pt verbalized understanding and denied any further questions or concerns at this time. ______ 

## 2015-07-11 NOTE — Telephone Encounter (Signed)
Patient called and said that his antibiotics were sent to the wrong pharmacy, they were [facility] to [facility].  Patient only gets OFEV through [facility], nothing else. [facility]. Patient notified. Nothing further needed.

## 2015-07-11 NOTE — Telephone Encounter (Signed)
Pt has been scheduled for appt

## 2015-07-11 NOTE — Telephone Encounter (Signed)
Pt called back to state dr ramaswamy's office was able to do the referral for pt to dermatolgy. appt w/ dt fry has been cancelled

## 2015-07-11 NOTE — Telephone Encounter (Signed)
I cannot refer for a problem that i have never seen. Have him make an OV to evaluate

## 2015-07-12 ENCOUNTER — Ambulatory Visit: Payer: Commercial Managed Care - HMO | Admitting: Family Medicine

## 2015-07-12 NOTE — Telephone Encounter (Signed)
noted 

## 2015-07-19 ENCOUNTER — Other Ambulatory Visit: Payer: Self-pay | Admitting: Internal Medicine

## 2015-07-19 DIAGNOSIS — Z006 Encounter for examination for normal comparison and control in clinical research program: Secondary | ICD-10-CM

## 2015-07-19 DIAGNOSIS — J84112 Idiopathic pulmonary fibrosis: Secondary | ICD-10-CM

## 2015-07-24 ENCOUNTER — Ambulatory Visit (INDEPENDENT_AMBULATORY_CARE_PROVIDER_SITE_OTHER): Payer: Commercial Managed Care - HMO | Admitting: Internal Medicine

## 2015-07-24 ENCOUNTER — Ambulatory Visit (INDEPENDENT_AMBULATORY_CARE_PROVIDER_SITE_OTHER)
Admission: RE | Admit: 2015-07-24 | Discharge: 2015-07-24 | Disposition: A | Discharge status: Home / Self Care | Payer: Commercial Managed Care - HMO | Source: Ambulatory Visit | Attending: Internal Medicine | Admitting: Internal Medicine

## 2015-07-24 ENCOUNTER — Ambulatory Visit (HOSPITAL_COMMUNITY)
Admission: RE | Admit: 2015-07-24 | Discharge: 2015-07-24 | Disposition: A | Discharge status: Home / Self Care | Payer: Commercial Managed Care - HMO | Source: Ambulatory Visit | Attending: Internal Medicine | Admitting: Internal Medicine

## 2015-07-24 VITALS — BP 124/80 | HR 92 | Temp 97.6°F | Resp 17 | Ht 69.0 in | Wt 171.4 lb

## 2015-07-24 DIAGNOSIS — Z006 Encounter for examination for normal comparison and control in clinical research program: Secondary | ICD-10-CM

## 2015-07-24 DIAGNOSIS — J84112 Idiopathic pulmonary fibrosis: Secondary | ICD-10-CM

## 2015-07-24 LAB — PULMONARY FUNCTION TEST
DL/VA % pred: 105 %
DL/VA: 4.65 ml/min/mmHg/L
DLCO unc % pred: 54 %
DLCO unc: 15.36 ml/min/mmHg
FEF 25-75 Pre: 2.64 L/sec
FEF2575-%Pred-Pre: 115 %
FEV1-%Pred-Pre: 77 %
FEV1-Pre: 2.28 L
FEV1FVC-%Pred-Pre: 114 %
FEV6-%Pred-Pre: 71 %
FEV6-Pre: 2.69 L
FEV6FVC-%Pred-Pre: 106 %
FVC-%Pred-Pre: 67 %
FVC-Pre: 2.69 L
Pre FEV1/FVC ratio: 84 %
Pre FEV6/FVC Ratio: 100 %

## 2015-07-24 NOTE — Progress Notes (Signed)
[facility] (ClinicalTrials.gov Identifier: [id number])  RESEARCH SUBJECT. Phase 2, randomized, double-blind, placebo-controlled study to evaluate the safety and tolerability of FG-3019 in subjects with IPF, and the efficacy of FG-3019 in slowing the loss of forced vital capacity (FVC) and the progression of IPF in these subjects. FG-3019 is a human recombinant monoclonal antibody that targets connective tissue growth factor (CTGF), which is a key factor in the pathogenesis of fibrosis. The ability of FG-3019 to block CTGF makes it a candidate for treating IPF.[facility] is sponsored by [employer].  [employer] is a [employer] located in [place], [place]. FG-3019 is administered as an IV infusion every three weeks .    Inclusion Criteria: IPF for </= 5 years, age 35-80 and FVC is >55% and your DLCO >/30% and body weight < 130kg     Key other data: side effects with infusion - Cough > 30%, Fatigue > 20%, Dyspnea > 20%, URI > 15%, Headache > 15%, Diarrhea > 15%. Rare is flushing, arm pain, back pain and hypertension. SEverity 75% is miild-moderate. > 74% have atleast 1 side effect over time. So far none discontinued or died during treatment,. 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  - Intolerant to Pirfenidone EAP program 2014  - On OFEV since early May 2015   PFT FVC fev1 ratio BD fev1 TLC DLCO Walk test 162f x 3 laps wt rx             Spring 2014 2.9:L L/% /%        June 2015 2.7L/67% 2.34L/78% 86  3.68/57% 20.24/71%   ofev start  [name]  2016 2.5L/55% 2.1L/60% 84/108%    No desat, Pk HR 130    02/11/2015 Screening visit afferent cough study 2.59L/56% 2.24L/63% 87/112%    Dx with afluttter on ekg   Screen failed due to hx of stones  03/26/2015        No desat, PK HR 130  [place]  [place] [facility]60[facility]     25.[facility] ([facility] uses gaensler equateion) No desat. PK HR 115  -     On ofev  07/24/2015 - [facility] - research screening for [facility]  2.69L/67% 2.79L/63.9 (in research [place])     15.36/54% ()   On ofev        This visit 07/24/2015  for Tanner Bishop with 6November 09, 1947who is Subject Number [id number]  is a research Visit and is for purpose of screening  and is number 1 on PROTOCOL.  Subject has signed consent and been given a copy following GCP guidelines.  His questions were answered.  After consent he had a PFT, DLCO and an HRCT. He is now here to see Dr. RChase Callerfor PE.  Will call subject once over view is complete.  He was last seen on a standard of care visit 07/03/2015 for IPF. At that time had acute sinusitis which we treated with cephalexin. Currently this is resolved. He is feeling baseline shortness of breath. Able to walk his long distances without desaturation. His sinuses cleared up. He is feeling well and at baseline. He did do a screening for the above study. He did mention when he went to the inclusion and exclusion criteria that he has a planned cataract surgery coming up     has a past medical history of Ulcer; GERD (gastroesophageal reflux disease);  Injury of right hand; Arthritis; Pulmonary fibrosis; Renal stone (06/2014); H/O hiatal hernia; Hyperlipidemia; and Atrial flutter.   reports that he quit smoking about 36 years ago. His smoking use included Cigarettes. He has a 16 pack-year smoking history. He has never used smokeless tobacco.  Past Surgical History  Procedure Laterality Date  . Colonoscopy  02-24-12    adenomatous polyps removed, repeat in 5 yrs, per Dr. Deatra Ina   . Esophagogastroduodenoscopy  2007  . Hand surgery Right   . Video assisted thoracoscopy Left 03/22/2013    Procedure: VIDEO ASSISTED THORACOSCOPY;  Surgeon: Melrose Nakayama, MD;  Location: [facility];  Service: Thoracic;  Laterality: Left;  . Lung biopsy Left 03/22/2013    Procedure: LUNG BIOPSY;  Surgeon: Melrose Nakayama, MD;  Location: [facility];  Service:  Thoracic;  Laterality: Left;  . Extracorporeal shock wave lithotripsy  06-17-14    per Dr. Jeffie Pollock   . Atrial flutter ablation N/A 04/08/2015    Procedure: ATRIAL FLUTTER ABLATION;  Surgeon: Evans Lance, MD;  Location[facility] CATH LAB;  Service: Cardiovascular;  Laterality: N/A;    No Known Allergies  Immunization History  Administered Date(s) Administered  . Influenza Split 09/13/2013  . Influenza-Unspecified 09/07/2014  . Pneumococcal Polysaccharide-23 05/26/2013  . Zoster 02/19/2014    Family History  Problem Relation Age of Onset  . Diabetes    . Cancer      prostate  . Liver cancer Mother   . Diabetes Sister   . Lung cancer Sister   . Heart disease Brother   . Diabetes Brother   . Colon cancer Brother      Current outpatient prescriptions:  .  cephALEXin (KEFLEX) 500 MG capsule, Take 1 capsule (500 mg total) by mouth 3 (three) times daily. by mouth three times a day  For 7 days, Disp: 21 capsule, Rfl: 0 .  cholecalciferol (VITAMIN D) 1000 UNITS tablet, Take 1,000 Units by mouth daily., Disp: , Rfl:  .  diphenhydrAMINE (BENADRYL) 25 MG tablet, Take 25 mg by mouth every 6 (six) hours as needed for allergies or sleep., Disp: , Rfl:  .  Nintedanib (OFEV) 150 MG CAPS, Take 1 tablet by mouth 2 (two) times daily., Disp: 60 capsule, Rfl: 5 .  pantoprazole (PROTONIX) 40 MG tablet, Take 1 tablet (40 mg total) by mouth 2 (two) times daily., Disp: 180 tablet, Rfl: 3 .  ranitidine (ZANTAC) 300 MG tablet, TAKE 1 TABLET BY MOUTH AT BEDTIME, Disp: 90 tablet, Rfl: 3 .  zolpidem (AMBIEN) 5 MG tablet, Take 1 tablet (5 mg total) by mouth at bedtime as needed for sleep., Disp: 30 tablet, Rfl: 5     O:  Filed Vitals:   07/24/15 1541  BP: 124/80  Pulse: 92  Temp: 97.6 F (36.4 C)  TempSrc: Oral  Resp: 17  Height: _0  (1.753 m)  Weight: 171 lb 6.4 oz (77.747 kg)  SpO2: 97%    This is filled out exam in the sheet. He has mild postnasal drip and baseline one fourth crackles which are  baseline. Otherwise nonfocal exam   LABS CT cjst 07/24/15 - ADDENDUM: Per discussion with [name], patient has biopsy-proven UIP/IPF. Therefore, today's CT findings correlate with ATS criteria for possible UIP.   Electronically Signed  By: Lorin Picket M.D.  On: 07/24/2015 17:05     A   ICD-9-CM ICD-10-CM   1. Research subject V70.7 Z00.6   2. IPF (idiopathic pulmonary fibrosis) 516.31 J84.112      -  Screening complete   - Will enquire on screening status with special focus on recent sinus infection and planned cataract surgery   - Be in Touch  - Discussed other general trial related issues   1. Scientific Purpose  Clinical research is designed to produce generalizable knowledge and to answer questions about the safety and efficacy of intervention(s) under study in order to determine whether or not they may be useful for the care of future patients.  2. Study Procedures  Participation in a trial may involve procedures or tests, in addition to the intervention(s) under study, that are intended only or primarily to generate scientific knowledge and that are otherwise not necessary for patient care.   3. Uncertainty  For intervention(s) under study in clinical research, there often is less knowledge and more uncertainty about the risks and benefits to a population of trial participants than there is when a doctor offers a patient standard interventions.   4. Adherence to Protocol  Administration of the intervention(s) under study is typically based on a strict protocol with defined dose, scheduling, and use or avoidance of concurrent medications, compared to administration of standard interventions.  5. Clinician as Investigator  Clinicians who are in health care settings provide treatment; in a clinical trial setting, they are also investigating safety and efficacy of an intervention. In otherwise your doctor or nurse practitioner can be wearing 2 hats -  one as care giver another as Company secretary  6. Patient as Visual merchandiser Subject  Patients participating in research trials are research subjects or volunteers. In other words participating in research is 100% voluntary and at one's own free weill. The decision to participate or not participate will NOT affect patient care and the doctor-patient relationship in any way  7. Financial Conflict of Interest Disclosure  Your referring physician(s) for the research trial has an investment interest in [employer], [facility] the clinical trials site and is both the company and the investigators are being compensated for their effort in trial research activities.       Dr. Brand Males, M.D., [facility].C.P Pulmonary and Critical Care Medicine Staff Physician [place] [facility] Pager: [phone], If no answer or between  15:00h - 7:00h: call [phone]  07/24/2015 4:32 PM

## 2015-07-24 NOTE — Patient Instructions (Addendum)
ICD-9-CM ICD-10-CM   1. Research subject V70.7 Z00.6   2. IPF (idiopathic pulmonary fibrosis) 516.31 J84.112    - Screening complete   - Will enquire on screening status with special focus on recent sinus infection and planned cataract surgery   - Be in Touch

## 2015-08-23 ENCOUNTER — Other Ambulatory Visit: Payer: Self-pay | Admitting: Internal Medicine

## 2015-08-23 DIAGNOSIS — J84112 Idiopathic pulmonary fibrosis: Secondary | ICD-10-CM

## 2015-08-23 DIAGNOSIS — Z006 Encounter for examination for normal comparison and control in clinical research program: Secondary | ICD-10-CM

## 2015-08-26 ENCOUNTER — Ambulatory Visit (HOSPITAL_COMMUNITY)
Admission: RE | Admit: 2015-08-26 | Discharge: 2015-08-26 | Disposition: A | Discharge status: Home / Self Care | Payer: Self-pay | Source: Ambulatory Visit | Attending: Internal Medicine | Admitting: Internal Medicine

## 2015-08-26 DIAGNOSIS — J84112 Idiopathic pulmonary fibrosis: Secondary | ICD-10-CM | POA: Insufficient documentation

## 2015-08-26 DIAGNOSIS — Z006 Encounter for examination for normal comparison and control in clinical research program: Secondary | ICD-10-CM | POA: Insufficient documentation

## 2015-08-26 LAB — PULMONARY FUNCTION TEST
DL/VA % pred: 103 %
DL/VA: 4.54 ml/min/mmHg/L
DLCO unc % pred: 54 %
DLCO unc: 15.36 ml/min/mmHg
FEF 25-75 Pre: 3.71 L/sec
FEF2575-%Pred-Pre: 161 %
FEV1-%Pred-Pre: 80 %
FEV1-Pre: 2.38 L
FEV1FVC-%Pred-Pre: 117 %
FEV6-%Pred-Pre: 72 %
FEV6-Pre: 2.71 L
FEV6FVC-%Pred-Pre: 105 %
FVC-%Pred-Pre: 68 %
FVC-Pre: 2.73 L
Pre FEV1/FVC ratio: 87 %
Pre FEV6/FVC Ratio: 99 %

## 2015-08-27 ENCOUNTER — Other Ambulatory Visit: Payer: Self-pay | Admitting: Internal Medicine

## 2015-09-03 ENCOUNTER — Encounter (HOSPITAL_COMMUNITY): Payer: Commercial Managed Care - HMO

## 2015-09-24 ENCOUNTER — Ambulatory Visit (INDEPENDENT_AMBULATORY_CARE_PROVIDER_SITE_OTHER): Payer: Commercial Managed Care - HMO | Admitting: Internal Medicine

## 2015-09-24 ENCOUNTER — Ambulatory Visit (HOSPITAL_COMMUNITY)
Admission: RE | Admit: 2015-09-24 | Discharge: 2015-09-24 | Disposition: A | Discharge status: Home / Self Care | Payer: Commercial Managed Care - HMO | Source: Ambulatory Visit | Attending: Internal Medicine | Admitting: Internal Medicine

## 2015-09-24 VITALS — BP 124/70 | HR 82 | Temp 98.1°F | Resp 17 | Wt 173.8 lb

## 2015-09-24 DIAGNOSIS — Z006 Encounter for examination for normal comparison and control in clinical research program: Secondary | ICD-10-CM | POA: Insufficient documentation

## 2015-09-24 DIAGNOSIS — J84112 Idiopathic pulmonary fibrosis: Secondary | ICD-10-CM | POA: Insufficient documentation

## 2015-09-24 MED ORDER — STUDY - INVESTIGATIONAL DRUG SIMPLE RECORD (ML)
125.0000 mL/h | Freq: Once | Status: AC
  Administered 2015-09-24: 125 mL/h via INTRAVENOUS
  Filled 2015-09-24: qty 118.5

## 2015-09-24 NOTE — Patient Instructions (Signed)
Infusion #1 oper protocol

## 2015-09-24 NOTE — Progress Notes (Signed)
[facility] (ClinicalTrials.gov Identifier: [id number])  RESEARCH SUBJECT. Phase 2, randomized, double-blind, placebo-controlled study to evaluate the safety and tolerability of FG-3019 in subjects with IPF, and the efficacy of FG-3019 in slowing the loss of forced vital capacity (FVC) and the progression of IPF in these subjects. FG-3019 is a human recombinant monoclonal antibody that targets connective tissue growth factor (CTGF), which is a key factor in the pathogenesis of fibrosis. The ability of FG-3019 to block CTGF makes it a candidate for treating IPF.[facility] is sponsored by [employer].  [employer] is a [employer] located in [place], [place]. FG-3019 is administered as an IV infusion every three weeks .    Inclusion Criteria: IPF for </= 5 years, age 68-80 and FVC is >55% and your DLCO >/30% and body weight < 130kg  Key other data: side effects with infusion - Cough > 30%, Fatigue > 20%, Dyspnea > 20%, URI > 15%, Headache > 15%, Diarrhea > 15%. Rare is flushing, arm pain, back pain and hypertension. SEverity 75% is miild-moderate. > 74% have atleast 1 side effect over time. So far none discontinued or died during treatment,. No overall difference between placebo and drug  Subjective as entered by CRC - Tanner Bishop  This visit 09/24/2015  for Tanner Bishop with 08/04/1954 who is Subject Number [id number]  is a research Visit and is for purpose of Day 1 Randomization and treatment and is number 3 on PROTOCOL.  Subject was reconsented with the latest consent addressing travel reimbursement, he also signed DNA consent.  All questions were answered, please refer to the consent process checklist for full details.  [facility] questionaire was completed prior to any study specific requirements as stated in protocol.  Also, it was discovered, the subject did take his Ofev prior to this am appointment at 0730.  Spoke with Monitor, Thomes Lolling about whether or not to proceed with the visit.  She advised to  continue on, to not do the trough PK however, complete the 4 hr requirement and to complete a deviation.  She also stated that this has happened at other sites as well due to the complexity of lab draws.  The subject has had Cataract surgery since last visit and this is reason for being out of window for randomization.  Please see printed emails in subjects chart from Medical monitor regarding approval to randomize out of window.  The subject did not have any other questions or concerns and has proceeded onto the hospital for his first IV infusion.  Erline Levine has been notified of all latest issues and how to proceed.    Subjective as entered by Dr Chase Caller - PI S: TRUE Tanner Bishop with 10-17-1954    Feels well. Baseline. Walking miles which is baseline exercise. No complaints. S/p cataract surgery 9./13/16 and 09/03/15 and doing well. Had research general questions . I met him at infusion site at [facility] -wife present. Also present [facility] Nurse at [place]:   09/24/15 0829  BP: 124/70  Pulse: 82  Temp: 98.1 F (36.7 C)  TempSrc: Oral  Resp: 17  Weight: 78.835 kg (173 lb 12.8 oz)  SpO2: 96%   Exam   in [facility] paper doc - crackles posteriorly half way up - baseline  A   ICD-9-CM ICD-10-CM   1. Research study patient V70.7 Z00.6   2. IPF (idiopathic pulmonary fibrosis) ([place]) 516.31 J84.112    P Inf[place] #1 per protocol   Other issues  discussed AGAIN   1. Scientific Purpose  Clinical research is designed to produce generalizable knowledge and to answer questions about the safety and efficacy of intervention(s) under study in order to determine whether or not they may be useful for the care of future patients.  2. Study Procedures  Participation in a trial may involve procedures or tests, in addition to the intervention(s) under study, that are intended only or primarily to generate scientific knowledge and that are otherwise not necessary for  patient care.   3. Uncertainty  For intervention(s) under study in clinical research, there often is less knowledge and more uncertainty about the risks and benefits to a population of trial participants than there is when a doctor offers a patient standard interventions.   4. Adherence to Protocol  Administration of the intervention(s) under study is typically based on a strict protocol with defined dose, scheduling, and use or avoidance of concurrent medications, compared to administration of standard interventions.  5. Clinician as Investigator  Clinicians who are in health care settings provide treatment; in a clinical trial setting, they are also investigating safety and efficacy of an intervention. In otherwise your doctor or nurse practitioner can be wearing 2 hats - one as care giver another as Company secretary  6. Patient as Visual merchandiser Subject  Patients participating in research trials are research subjects or volunteers. In other words participating in research is 100% voluntary and at one's own free weill. The decision to participate or not participate will NOT affect patient care and the doctor-patient relationship in any way  7. Financial Conflict of Interest Disclosure  Your referring physician(s) for the research trial has an investment interest in [facility], [facility] the clinical trials site and is both the company and the investigators are being compensated for their effort in trial research activities.    Dr. Brand Males, M.D., [facility].C.P Pulmonary and Critical Care Medicine Staff Physician [facility] Pager: [phone], If no answer or between  15:00h - 7:00h: call [phone]  09/24/2015 9:14 AM

## 2015-09-24 NOTE — Research (Signed)
Patient Tanner Bishop present for day 1 infusion.  Routine Ofev dose taken prior to visit so pre-dose PK blood sample for Ofev was not drawn.  [name], RN

## 2015-10-10 ENCOUNTER — Ambulatory Visit: Payer: Commercial Managed Care - HMO | Admitting: Internal Medicine

## 2015-10-10 ENCOUNTER — Telehealth: Payer: Self-pay | Admitting: Internal Medicine

## 2015-10-10 NOTE — Telephone Encounter (Signed)
lmtcb x1 on home # Called mobile and line would ring then D/C tried x 2 WCB

## 2015-10-10 NOTE — Telephone Encounter (Signed)
Called spoke with pt. appt cancelled. Nothing further needed

## 2015-10-10 NOTE — Telephone Encounter (Signed)
Patient Returned call   [phone]

## 2015-10-10 NOTE — Telephone Encounter (Signed)
Triage/Tanner Bishop  Tanner Bishop has ov with me 10/10/2015 Pm - this is std of care visit set up July 2016. In interim he has enrolled in study. I just saw him not too long ago during study visit. So, no need for me to see him today unless he feels I need to see him. Please call him ASAP and cancel OV if he is ok with it  Dr. Brand Males, M.D., [facility].C.P Pulmonary and Critical Care Medicine Staff Physician [facility] Pager: [phone], If no answer or between  15:00h - 7:00h: call [phone]  10/10/2015 10:45 AM

## 2015-10-15 ENCOUNTER — Ambulatory Visit (HOSPITAL_COMMUNITY)
Admission: RE | Admit: 2015-10-15 | Discharge: 2015-10-15 | Disposition: A | Discharge status: Home / Self Care | Payer: Commercial Managed Care - HMO | Source: Ambulatory Visit | Attending: Internal Medicine | Admitting: Internal Medicine

## 2015-10-15 MED ORDER — STUDY - INVESTIGATIONAL DRUG SIMPLE RECORD (ML)
250.0000 mL/h | Status: DC
  Filled 2015-10-15: qty 118.5

## 2015-10-15 MED ORDER — STUDY - INVESTIGATIONAL DRUG SIMPLE RECORD (ML)
250.0000 mL/h | Status: DC
  Administered 2015-10-15: 250 mL/h via INTRAVENOUS
  Filled 2015-10-15: qty 118.5

## 2015-10-15 NOTE — Research (Addendum)
Research subject present for infusion of Fibrogen study drug FGCL-3019/placebo, Week 3.  Study drug administered by bedside RN staff and patient monitored by bedside RN staff.  This RN went to bedside to speak with patient prior to infusion start and again at 11:30am, patient reported no changes in health or medications, tolerated infusion well.  [name], RN

## 2015-11-05 ENCOUNTER — Ambulatory Visit (INDEPENDENT_AMBULATORY_CARE_PROVIDER_SITE_OTHER): Payer: Commercial Managed Care - HMO | Admitting: Adult Health

## 2015-11-05 ENCOUNTER — Ambulatory Visit (HOSPITAL_COMMUNITY)
Admission: RE | Admit: 2015-11-05 | Discharge: 2015-11-05 | Disposition: A | Discharge status: Home / Self Care | Payer: Commercial Managed Care - HMO | Source: Ambulatory Visit | Attending: Internal Medicine | Admitting: Internal Medicine

## 2015-11-05 ENCOUNTER — Encounter: Payer: Self-pay | Admitting: Adult Health

## 2015-11-05 VITALS — BP 132/76 | HR 87 | Temp 97.6°F | Resp 17 | Wt 174.0 lb

## 2015-11-05 DIAGNOSIS — Z006 Encounter for examination for normal comparison and control in clinical research program: Secondary | ICD-10-CM

## 2015-11-05 MED ORDER — STUDY - INVESTIGATIONAL DRUG SIMPLE RECORD (ML)
250.0000 mL/h | Freq: Once | Status: AC
  Administered 2015-11-05: 250 mL/h via INTRAVENOUS
  Filled 2015-11-05: qty 240

## 2015-11-05 NOTE — Progress Notes (Signed)
   Subjective:    Patient ID: Creed Kail, male    DOB: 1946/02/21, 69 y.o.   MRN: [id number]  HPI [facility] (ClinicalTrials.gov Identifier: [id number]) RESEARCH SUBJECT. Phase 2, randomized, double-blind, placebo-controlled study to evaluate the safety and tolerability of FG-3019 in subjects with IPF, and the efficacy of FG-3019 in slowing the loss of forced vital capacity (FVC) and the progression of IPF in these subjects. FG-3019 is a human recombinant monoclonal antibody that targets connective tissue growth factor (CTGF), which is a key factor in the pathogenesis of fibrosis. The ability of FG-3019 to block CTGF makes it a candidate for treating IPF.[facility] is sponsored by [employer]. [employer] is a [employer] located in [place], [place]. FG-3019 is administered as an IV infusion every three weeks .  Inclusion Criteria: IPF for </= 5 years, age 71-80 and FVC is >55% and your DLCO >/30% and body weight < 130kg    11/05/2015 Research Visit -Week 6 Fibrogen  Pt presents for week 6 research visit.  Subject [phone] is a research Visit and is for purpose of treatment follow up and is week 6.  He says he is doing great, no new complaints or change in medications.   Denies chest pain, flare of cough or dyspnea, hemoptysis , orthopnea, fever, headache or n/v.  Says she has not noticed any side effects from infusion .     Review of Systems See HPI     Objective:   Physical Exam GEN: A/Ox3; pleasant , NAD, well nourished   VS reviewed   HEENT:  [place]/AT,  EACs-clear, TMs-wnl, NOSE-clear, THROAT-clear, no lesions, no postnasal drip or exudate noted.   NECK:  Supple w/ fair ROM; no JVD; normal carotid impulses w/o bruits; no thyromegaly or nodules palpated; no lymphadenopathy.  RESP  Faint BB crackles no accessory muscle use, no dullness to percussion  CARD:  RRR, no m/r/g  , no peripheral edema, pulses intact, no cyanosis or clubbing.  GI:   Soft & nt; nml bowel sounds; no  organomegaly or masses detected.  Musco: Warm bil, no deformities or joint swelling noted.   Neuro: alert, no focal deficits noted.    Skin: Warm, no lesions or rashes         Assessment & Plan:

## 2015-11-05 NOTE — Assessment & Plan Note (Signed)
Compensated without flare

## 2015-11-05 NOTE — Progress Notes (Signed)
[facility] (ClinicalTrials.gov Identifier: [id number])  RESEARCH SUBJECT. Phase 2, randomized, double-blind, placebo-controlled study to evaluate the safety and tolerability of FG-3019 in subjects with IPF, and the efficacy of FG-3019 in slowing the loss of forced vital capacity (FVC) and the progression of IPF in these subjects. FG-3019 is a human recombinant monoclonal antibody that targets connective tissue growth factor (CTGF), which is a key factor in the pathogenesis of fibrosis. The ability of FG-3019 to block CTGF makes it a candidate for treating IPF.[facility] is sponsored by [employer].  [employer] is a [employer] located in [place], [place]. FG-3019 is administered as an IV infusion every three weeks .    Inclusion Criteria: IPF for </= 5 years, age 6-80 and FVC is >55% and your DLCO >/30% and body weight < 130kg  Key other data: side effects with infusion - Cough > 30%, Fatigue > 20%, Dyspnea > 20%, URI > 15%, Headache > 15%, Diarrhea > 15%. Rare is flushing, arm pain, back pain and hypertension. SEverity 75% is miild-moderate. > 74% have atleast 1 side effect over time. So far none discontinued or died during treatment,. No overall difference between placebo and drug   This visit 11/05/2015  for Tanner Bishop with 16-Mar-1946 who is Subject [phone]  is a research Visit and is for purpose of treatment follow up and is week 6. Subject states he is doing great, no new complaints or change in medications.  He will see [name]. This am and then go to the hospital for treatment.

## 2015-11-05 NOTE — Assessment & Plan Note (Signed)
Doing well in research study  No apparent adverse events.  Cont w/ study protocol.

## 2015-11-05 NOTE — Research (Addendum)
Patient presents today for infusion of study medication.  Today is patient's first full dose infusion, so infusion planned to infuse over 1 hour with 1 hour observation period.  This RN was present for start of infusion.  No complaints, patient tolerating infusion well.  Bedside RNs to monitor patient during remainder of infusion and during observation period.  Reminded patient that next visit patient will take routine morning dose of Ofev at the hospital (not at home), will collect blood prior to [facility] and study medication dosing.  Patient voiced understanding.  [name], RN

## 2015-11-05 NOTE — Patient Instructions (Signed)
Continue with research protocol .

## 2015-11-25 ENCOUNTER — Telehealth: Payer: Self-pay | Admitting: Internal Medicine

## 2015-11-25 NOTE — Telephone Encounter (Signed)
Called pt on home # provided, was told by unnamed woman to call pt's cell phone #.  Verified cell # on file. Called cell #, line rang several times and went dead.  Wcb.

## 2015-11-26 ENCOUNTER — Ambulatory Visit (HOSPITAL_COMMUNITY)
Admission: RE | Admit: 2015-11-26 | Discharge: 2015-11-26 | Disposition: A | Discharge status: Home / Self Care | Payer: Commercial Managed Care - HMO | Source: Ambulatory Visit | Attending: Internal Medicine | Admitting: Internal Medicine

## 2015-11-26 MED ORDER — NINTEDANIB ESYLATE 150 MG PO CAPS: 1.0000 | ORAL_CAPSULE | Freq: Two times a day (BID) | ORAL | Status: DC

## 2015-11-26 MED ORDER — STUDY - INVESTIGATIONAL DRUG SIMPLE RECORD (ML)
125.0000 mL/h | Freq: Once | Status: AC
  Administered 2015-11-26: 250 mL/h via INTRAVENOUS
  Filled 2015-11-26: qty 237

## 2015-11-26 NOTE — Telephone Encounter (Signed)
Left message on voicemail advising patient to call back.

## 2015-11-26 NOTE — Telephone Encounter (Signed)
Pt called back-please contact at [phone].

## 2015-11-26 NOTE — Research (Signed)
Infusion complete, patient tolerated infusion well.    [name], RN

## 2015-11-26 NOTE — Telephone Encounter (Signed)
Spoke with pt. He needs a refill on Ofev. This has been sent in to his preferred pharmacy. Nothing further was needed.

## 2015-11-26 NOTE — Research (Signed)
[facility] (ClinicalTrials.gov Identifier: [id number])  RESEARCH SUBJECT. Phase 2, randomized, double-blind, placebo-controlled study to evaluate the safety and tolerability of FG-3019 in subjects with IPF, and the efficacy of FG-3019 in slowing the loss of forced vital capacity (FVC) and the progression of IPF in these subjects. FG-3019 is a human recombinant monoclonal antibody that targets connective tissue growth factor (CTGF), which is a key factor in the pathogenesis of fibrosis. The ability of FG-3019 to block CTGF makes it a candidate for treating IPF.[facility] is sponsored by [employer].  [employer] is a [employer] located in [place], [place]. FG-3019 is administered as an IV infusion every three weeks .    Inclusion Criteria: IPF for </= 5 years, age 74-80 and FVC is >55% and your DLCO >/30% and body weight < 130kg  Key other data: side effects with infusion - Cough > 30%, Fatigue > 20%, Dyspnea > 20%, URI > 15%, Headache > 15%, Diarrhea > 15%. Rare is flushing, arm pain, back pain and hypertension. SEverity 75% is miild-moderate. > 74% have atleast 1 side effect over time. So far none discontinued or died during treatment,. No overall difference between placebo and drug   This visit 11/26/2015  for Tanner Bishop with Jun 02, 1946 who is Subject Number [id number]  is a Research Visit and is for purpose of Week 9 Infusion and is number 6 on PROTOCOL.     Patient present today for Week 9 infusion of FG-3019/placebo.  Patient reports no changes in health or medications.  Overall feels well.  Morning dose of routine Ofev was held until study drug infusion was initiated.  Pre-dose lab work was drawn.  This RN was present for initiation of infusion administration and taking of morning Ofev dose by patient.  Patient tolerating well.  Bedside RN staff to continue to monitor patient during administration of study drug and during observation period.  [name], RN

## 2015-12-17 ENCOUNTER — Ambulatory Visit (HOSPITAL_COMMUNITY)
Admission: RE | Admit: 2015-12-17 | Discharge: 2015-12-17 | Disposition: A | Discharge status: Home / Self Care | Payer: Commercial Managed Care - HMO | Source: Ambulatory Visit | Attending: Internal Medicine | Admitting: Internal Medicine

## 2015-12-17 ENCOUNTER — Ambulatory Visit (INDEPENDENT_AMBULATORY_CARE_PROVIDER_SITE_OTHER): Payer: Commercial Managed Care - HMO | Admitting: Internal Medicine

## 2015-12-17 VITALS — Wt 176.2 lb

## 2015-12-17 DIAGNOSIS — J84112 Idiopathic pulmonary fibrosis: Secondary | ICD-10-CM

## 2015-12-17 DIAGNOSIS — Z006 Encounter for examination for normal comparison and control in clinical research program: Secondary | ICD-10-CM

## 2015-12-17 MED ORDER — STUDY - INVESTIGATIONAL DRUG SIMPLE RECORD (ML)
125.0000 mL/h | Freq: Once | Status: AC
  Administered 2015-12-17: 500 mL/h via INTRAVENOUS
  Filled 2015-12-17: qty 240.6

## 2015-12-17 NOTE — Research (Signed)
[facility] (ClinicalTrials.gov Identifier: [id number])  RESEARCH SUBJECT. Phase 2, randomized, double-blind, placebo-controlled study to evaluate the safety and tolerability of FG-3019 in subjects with IPF, and the efficacy of FG-3019 in slowing the loss of forced vital capacity (FVC) and the progression of IPF in these subjects. FG-3019 is a human recombinant monoclonal antibody that targets connective tissue growth factor (CTGF), which is a key factor in the pathogenesis of fibrosis. The ability of FG-3019 to block CTGF makes it a candidate for treating IPF.[facility] is sponsored by [employer].  [employer] is a [employer] located in [place], [place]. FG-3019 is administered as an IV infusion every three weeks .    Inclusion Criteria: IPF for </= 5 years, age 20-80 and FVC is >55% and your DLCO >/30% and body weight < 130kg  Key other data: side effects with infusion - Cough > 30%, Fatigue > 20%, Dyspnea > 20%, URI > 15%, Headache > 15%, Diarrhea > 15%. Rare is flushing, arm pain, back pain and hypertension. SEverity 75% is miild-moderate. > 74% have atleast 1 side effect over time. So far none discontinued or died during treatment,. No overall difference between placebo and drug   This visit 12/17/2015  for Marquez Flury with 1946-01-31 who is Subject Number [id number]  is a Research Visit and is for purpose of Week 12 Infusion on PROTOCOL.  Patient examined at bedside by Sub investigator Dr. Merrie Roof, MD.  See Sponsor provided paper source document for details of assessment.  Patient reports no changes in overall health, medications.  Patient feels well.  This RN present for initiation of study drug/placebo infusion.  Patient tolerating well.  Bedside RN staff to administer study drug and monitor patient during observation period.  [name], RN

## 2015-12-17 NOTE — Progress Notes (Signed)
[facility] (ClinicalTrials.gov Identifier: [id number])  RESEARCH SUBJECT. Phase 2, randomized, double-blind, placebo-controlled study to evaluate the safety and tolerability of FG-3019 in subjects with IPF, and the efficacy of FG-3019 in slowing the loss of forced vital capacity (FVC) and the progression of IPF in these subjects. FG-3019 is a human recombinant monoclonal antibody that targets connective tissue growth factor (CTGF), which is a key factor in the pathogenesis of fibrosis. The ability of FG-3019 to block CTGF makes it a candidate for treating IPF.[facility] is sponsored by [employer].  [employer] is a [employer] located in [place], [place]. FG-3019 is administered as an IV infusion every three weeks .    Inclusion Criteria: IPF for </= 5 years, age 68-80 and FVC is >55% and your DLCO >/30% and body weight < 130kg  Key other data: side effects with infusion - Cough > 30%, Fatigue > 20%, Dyspnea > 20%, URI > 15%, Headache > 15%, Diarrhea > 15%. Rare is flushing, arm pain, back pain and hypertension. SEverity 75% is miild-moderate. > 74% have atleast 1 side effect over time. So far none discontinued or died during treatment,. No overall difference between placebo and drug   This visit 12/17/2015  for Tanner Bishop with [birth year] who is Subject Number [id number]  is a research Visit and is for purpose of treatment and is week 12 on PROTOCOL.  Subject returns reports no new Adverse events nor changes to his medications when asked.  [facility]. [facility]ona [facility] questionaire has been completed per protocol and a weight has been obtained.  Subject also completed PFT's using sponsor machine and completed requirement with little difficulty. Subject is scheduled to have infusion at 10 am and will proceed there directly.  Subject will call should any concerns or needs come up following treatment.

## 2015-12-26 ENCOUNTER — Ambulatory Visit: Payer: Commercial Managed Care - HMO | Admitting: Internal Medicine

## 2016-01-07 ENCOUNTER — Ambulatory Visit (INDEPENDENT_AMBULATORY_CARE_PROVIDER_SITE_OTHER): Payer: Commercial Managed Care - HMO | Admitting: Internal Medicine

## 2016-01-07 ENCOUNTER — Ambulatory Visit (HOSPITAL_COMMUNITY)
Admission: RE | Admit: 2016-01-07 | Discharge: 2016-01-07 | Disposition: A | Discharge status: Home / Self Care | Payer: Commercial Managed Care - HMO | Source: Ambulatory Visit | Attending: Internal Medicine | Admitting: Internal Medicine

## 2016-01-07 DIAGNOSIS — J439 Emphysema, unspecified: Secondary | ICD-10-CM | POA: Insufficient documentation

## 2016-01-07 DIAGNOSIS — R0989 Other specified symptoms and signs involving the circulatory and respiratory systems: Secondary | ICD-10-CM | POA: Insufficient documentation

## 2016-01-07 DIAGNOSIS — I517 Cardiomegaly: Secondary | ICD-10-CM | POA: Insufficient documentation

## 2016-01-07 DIAGNOSIS — Z006 Encounter for examination for normal comparison and control in clinical research program: Secondary | ICD-10-CM

## 2016-01-07 DIAGNOSIS — J84112 Idiopathic pulmonary fibrosis: Secondary | ICD-10-CM

## 2016-01-07 MED ORDER — STUDY - INVESTIGATIONAL DRUG SIMPLE RECORD (ML)
500.0000 mL/h | Freq: Once | Status: AC
  Administered 2016-01-07: 500 mL/h via INTRAVENOUS
  Filled 2016-01-07: qty 240.6

## 2016-01-07 NOTE — Progress Notes (Signed)
[facility] (ClinicalTrials.gov Identifier: [id number])  RESEARCH SUBJECT. Phase 2, randomized, double-blind, placebo-controlled study to evaluate the safety and tolerability of FG-3019 in subjects with IPF, and the efficacy of FG-3019 in slowing the loss of forced vital capacity (FVC) and the progression of IPF in these subjects. FG-3019 is a human recombinant monoclonal antibody that targets connective tissue growth factor (CTGF), which is a key factor in the pathogenesis of fibrosis. The ability of FG-3019 to block CTGF makes it a candidate for treating IPF.[facility] is sponsored by [employer].  [employer] is a [employer] located in [place], [place]. FG-3019 is administered as an IV infusion every three weeks .    Inclusion Criteria: IPF for </= 5 years, age 69-80 and FVC is >55% and your DLCO >/30% and body weight < 130kg  Key other data: side effects with infusion - Cough > 30%, Fatigue > 20%, Dyspnea > 20%, URI > 15%, Headache > 15%, Diarrhea > 15%. Rare is flushing, arm pain, back pain and hypertension. SEverity 75% is miild-moderate. > 74% have atleast 1 side effect over time. So far none discontinued or died during treatment,. No overall difference between placebo and drug   This visit 01/07/2016  for Tanner Bishop with [birth year] who is Subject Number [id number] is a Research Visit and is for purpose of Week 15 infusion on PROTOCOL.  Patient seen by this RN prior to initiation of study drug/placebo infusion.  Patient states that he feels well and has had no changes in medication.  Infusion initiated per protocol by this RN; patient tolerating well.  Week 15 infusion scheduled.  After initiation of infusion, patient mentions that he will sometimes hear crackles with his breathing and asked if the crackles were common with IPF. After questioning by this RN, patient reports that his exercise routine is unchanged and his home pulse oximetry measurements have remained steady between 96-97%.   Patient states that his shortness of breath has been a slow gradual decline throughout the progression of his disease and has noticed any substantial worsening or decline since start of study.  Patient states that he sometimes coughs in the morning or evening, but that is his baseline and he has not had a cough when he noticed the crackles. Patient only notices at times when he is relaxing, and began to notice the crackling noise about three days ago.  Patient does not feel any different from baseline. At baseline, PI noted in assessment that patient has crackles half way up, unchanged at week 12 visit sub-I assessment.    [name], RN

## 2016-01-07 NOTE — Progress Notes (Signed)
Patient seen after completion of infusion.  Patient states no changes, has no complaints.  Sub-I Dr. Titus Mould notified of patient hearing crackles, new verbal orders received.  Order for portable Chest X-ray placed. Per order, patient walked with this RN through Medical Day Care unit for six minutes (six total loops around unit).  SpO2 measured continuously during ambulation.  SpO2 dropped down to 86-87% at lowest, on three to four occassions for about 30 seconds each occassion.  No intervention required to increase SpO2 to low to mid 90s. SpO2 low to mid 90s for majority of walk. Patient reports no shortness of breath throughout walk.  SpO2 at end of walk at 88%, recovered to 100% after seated with deep breathing about one minute.  Again, patient reports no shortness of breath at end of walk. CXR completed.  PI [name] made aware of patient report of crackles and new orders from Dr. Titus Mould (prior to completion of CXR) and walk report from this RN via phone.  [name] states likely a mild progression of IPF unrelated to protocol or study drug infusion.  [name] agrees with plan of care for today.  Per protocol patient will see a provider in three weeks and will have PFTs completed again in three to nine weeks.  [name] agrees this is satisfactory timeline and to continue per protocol.  CXR reviewed by Dr. Titus Mould. Report of walk test reviewed verbally with Dr. Titus Mould by this RN.  Confirmed patient has not had an increase in lower extremity edema.  Dr. Titus Mould indicated OK to discharge; patient to call or seek treatment with any changes or if notices any worsening.  This RN reviewed with patient.  Patient demonstrated understanding.  Patient given card with [facility] office numbers, patient stated he has [facility] phone number already.  Patient verbalized understanding that if he were to feel worse after business hours and need medical attenion, he should not delay  seeking treatment to notify research staff.  Coordinator to be in touch to schedule Week 18 appointment at [facility] for protocol requirements prior to infusion.  [name], RN

## 2016-01-17 ENCOUNTER — Other Ambulatory Visit (INDEPENDENT_AMBULATORY_CARE_PROVIDER_SITE_OTHER): Payer: Commercial Managed Care - HMO

## 2016-01-17 DIAGNOSIS — Z125 Encounter for screening for malignant neoplasm of prostate: Secondary | ICD-10-CM | POA: Diagnosis not present

## 2016-01-17 DIAGNOSIS — Z0000 Encounter for general adult medical examination without abnormal findings: Secondary | ICD-10-CM

## 2016-01-17 LAB — CBC WITH DIFFERENTIAL/PLATELET
Basophils Absolute: 0.1 10*3/uL (ref 0.0–0.1)
Basophils Relative: 0.5 % (ref 0.0–3.0)
Eosinophils Absolute: 0.2 10*3/uL (ref 0.0–0.7)
Eosinophils Relative: 2.3 % (ref 0.0–5.0)
HCT: 46.7 % (ref 39.0–52.0)
Hemoglobin: 15.3 g/dL (ref 13.0–17.0)
Lymphocytes Relative: 24.1 % (ref 12.0–46.0)
Lymphs Abs: 2.5 10*3/uL (ref 0.7–4.0)
MCHC: 32.8 g/dL (ref 30.0–36.0)
MCV: 95.7 fl (ref 78.0–100.0)
Monocytes Absolute: 0.9 10*3/uL (ref 0.1–1.0)
Monocytes Relative: 8.6 % (ref 3.0–12.0)
Neutro Abs: 6.7 10*3/uL (ref 1.4–7.7)
Neutrophils Relative %: 64.5 % (ref 43.0–77.0)
Platelets: 316 10*3/uL (ref 150.0–400.0)
RBC: 4.88 Mil/uL (ref 4.22–5.81)
RDW: 13.9 % (ref 11.5–15.5)
WBC: 10.4 10*3/uL (ref 4.0–10.5)

## 2016-01-17 LAB — BASIC METABOLIC PANEL
BUN: 14 mg/dL (ref 6–23)
CO2: 33 mEq/L — ABNORMAL HIGH (ref 19–32)
Calcium: 9.9 mg/dL (ref 8.4–10.5)
Chloride: 103 mEq/L (ref 96–112)
Creatinine, Ser: 1.13 mg/dL (ref 0.40–1.50)
GFR: 68.46 mL/min (ref >60.00)
Glucose, Bld: 107 mg/dL — ABNORMAL HIGH (ref 70–99)
Potassium: 5.1 mEq/L (ref 3.5–5.1)
Sodium: 145 mEq/L (ref 135–145)

## 2016-01-17 LAB — HEPATIC FUNCTION PANEL
ALT: 10 U/L (ref 0–53)
AST: 18 U/L (ref 0–37)
Albumin: 4 g/dL (ref 3.5–5.2)
Alkaline Phosphatase: 53 U/L (ref 39–117)
Bilirubin, Direct: 0.1 mg/dL (ref 0.0–0.3)
Total Bilirubin: 0.4 mg/dL (ref 0.2–1.2)
Total Protein: 7.1 g/dL (ref 6.0–8.3)

## 2016-01-17 LAB — POC URINALSYSI DIPSTICK (AUTOMATED)
Bilirubin, UA: NEGATIVE
Blood, UA: NEGATIVE
Glucose, UA: NEGATIVE
Ketones, UA: NEGATIVE
Leukocytes, UA: NEGATIVE
Nitrite, UA: NEGATIVE
Spec Grav, UA: >=1.03
Urobilinogen, UA: 0.2
pH, UA: 6

## 2016-01-17 LAB — LIPID PANEL
Cholesterol: 192 mg/dL (ref 0–200)
HDL: 55 mg/dL (ref >39.00)
LDL Cholesterol: 123 mg/dL — ABNORMAL HIGH (ref 0–99)
NonHDL: 136.63
Total CHOL/HDL Ratio: 3
Triglycerides: 70 mg/dL (ref 0.0–149.0)
VLDL: 14 mg/dL (ref 0.0–40.0)

## 2016-01-17 LAB — PSA: PSA: 0.98 ng/mL (ref 0.10–4.00)

## 2016-01-17 LAB — TSH: TSH: 1.96 u[IU]/mL (ref 0.35–4.50)

## 2016-01-24 ENCOUNTER — Encounter: Payer: Self-pay | Admitting: Family Medicine

## 2016-01-24 ENCOUNTER — Ambulatory Visit (INDEPENDENT_AMBULATORY_CARE_PROVIDER_SITE_OTHER): Payer: Commercial Managed Care - HMO | Admitting: Family Medicine

## 2016-01-24 VITALS — BP 121/68 | HR 79 | Temp 98.1°F | Ht 69.0 in | Wt 175.0 lb

## 2016-01-24 DIAGNOSIS — Z0000 Encounter for general adult medical examination without abnormal findings: Secondary | ICD-10-CM

## 2016-01-24 DIAGNOSIS — R739 Hyperglycemia, unspecified: Secondary | ICD-10-CM | POA: Diagnosis not present

## 2016-01-24 DIAGNOSIS — E559 Vitamin D deficiency, unspecified: Secondary | ICD-10-CM | POA: Diagnosis not present

## 2016-01-24 LAB — VITAMIN D 25 HYDROXY (VIT D DEFICIENCY, FRACTURES): VITD: 36.13 ng/mL (ref 30.00–100.00)

## 2016-01-24 LAB — HEMOGLOBIN A1C: Hgb A1c MFr Bld: 6 % (ref 4.6–6.5)

## 2016-01-24 MED ORDER — PANTOPRAZOLE SODIUM 40 MG PO TBEC: 40.0000 mg | DELAYED_RELEASE_TABLET | Freq: Two times a day (BID) | ORAL | Status: DC

## 2016-01-24 NOTE — Progress Notes (Signed)
Pre visit review using our clinic review tool, if applicable. No additional management support is needed unless otherwise documented below in the visit note.

## 2016-01-24 NOTE — Progress Notes (Signed)
   Subjective:    Patient ID: Tanner Bishop, male    DOB: [birth year], 70 y.o.   MRN: [id number]  HPI 70 yr old male for a cpx. He feels well except for some mild SOB/. He has pulmonary fibrosis, and he see [name] for this. He exercises daily. 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   Review of Systems  Constitutional: Negative.   HENT: Negative.   Eyes: Negative.   Respiratory: Positive for shortness of breath. Negative for apnea, cough, choking, chest tightness, wheezing and stridor.   Cardiovascular: Negative.   Gastrointestinal: Negative.   Genitourinary: Negative.   Musculoskeletal: Negative.   Skin: Negative.   Neurological: Negative.   Psychiatric/Behavioral: Negative.        Objective:   Physical Exam  Constitutional: He is oriented to person, place, and time. He appears well-developed and well-nourished. No distress.  HENT:  Head: Normocephalic and atraumatic.  Right Ear: External ear normal.  Left Ear: External ear normal.  Nose: Nose normal.  Mouth/Throat: Oropharynx is clear and moist. No oropharyngeal exudate.  Eyes: Conjunctivae and EOM are normal. Pupils are equal, round, and reactive to light. Right eye exhibits no discharge. Left eye exhibits no discharge. No scleral icterus.  Neck: Neck supple. No JVD present. No tracheal deviation present. No thyromegaly present.  Cardiovascular: Normal rate, regular rhythm, normal heart sounds and intact distal pulses.  Exam reveals no gallop and no friction rub.   No murmur heard. Pulmonary/Chest: Effort normal. No respiratory distress. He has no wheezes. He has no rales. He exhibits no tenderness.  Fine dry crackles at both bases  Abdominal: Soft. Bowel sounds are normal. He exhibits no distension and no mass. There is no tenderness. There is no rebound and no guarding.  Genitourinary: Rectum normal, prostate  normal and penis normal. Guaiac negative stool. No penile tenderness.  Musculoskeletal: Normal range of motion. He exhibits no edema or tenderness.  Lymphadenopathy:    He has no cervical adenopathy.  Neurological: He is alert and oriented to person, place, and time. He has normal reflexes. No cranial nerve deficit. He exhibits normal muscle tone. Coordination normal.  Skin: Skin is warm and dry. No rash noted. He is not diaphoretic. No erythema. No pallor.  Psychiatric: He has a normal mood and affect. His behavior is normal. Judgment and thought content normal.          Assessment & Plan:  Well exam. We discussed diet and exercise. He has a hx of low vitamin D so we will check a level today. He has had some mild glucose elevations, so we will get a baseline A1c today.

## 2016-01-28 ENCOUNTER — Ambulatory Visit (INDEPENDENT_AMBULATORY_CARE_PROVIDER_SITE_OTHER): Payer: Commercial Managed Care - HMO | Admitting: Internal Medicine

## 2016-01-28 ENCOUNTER — Ambulatory Visit (HOSPITAL_COMMUNITY)
Admission: RE | Admit: 2016-01-28 | Discharge: 2016-01-28 | Disposition: A | Discharge status: Home / Self Care | Payer: Commercial Managed Care - HMO | Source: Ambulatory Visit | Attending: Internal Medicine | Admitting: Internal Medicine

## 2016-01-28 ENCOUNTER — Encounter: Payer: Self-pay | Admitting: Adult Health

## 2016-01-28 ENCOUNTER — Ambulatory Visit (INDEPENDENT_AMBULATORY_CARE_PROVIDER_SITE_OTHER): Payer: Commercial Managed Care - HMO | Admitting: Adult Health

## 2016-01-28 VITALS — Wt 178.0 lb

## 2016-01-28 DIAGNOSIS — J84112 Idiopathic pulmonary fibrosis: Secondary | ICD-10-CM

## 2016-01-28 DIAGNOSIS — Z006 Encounter for examination for normal comparison and control in clinical research program: Secondary | ICD-10-CM

## 2016-01-28 MED ORDER — STUDY - INVESTIGATIONAL DRUG SIMPLE RECORD (ML)
125.0000 mL/h | Freq: Once | Status: AC
  Administered 2016-01-28: 500 mL/h via INTRAVENOUS
  Filled 2016-01-28: qty 240.6

## 2016-01-28 NOTE — Assessment & Plan Note (Signed)
  [facility] (ClinicalTrials.gov Identifier: [id number]) RESEARCH SUBJECT. Phase 2, randomized, double-blind, placebo-controlled study to evaluate the safety and tolerability of FG-3019 in subjects with IPF, and the efficacy of FG-3019 in slowing the loss of forced vital capacity (FVC) and the progression of IPF in these subjects. FG-3019 is a human recombinant monoclonal antibody that targets connective tissue growth factor (CTGF), which is a key factor in the pathogenesis of fibrosis. The ability of FG-3019 to block CTGF makes it a candidate for treating IPF.[facility] is sponsored by [employer]. [employer] is a [employer] located in [place], [place]. FG-3019 is administered as an IV infusion every three weeks .  Inclusion Criteria: IPF for </= 5 years, age 69-80 and FVC is >55% and your DLCO >/30% and body weight < 130kg   Week 18 Fibrogen study Appears to be doing well  Cont w/ study protocol

## 2016-01-28 NOTE — Progress Notes (Signed)
[facility] (ClinicalTrials.gov Identifier: [id number])  RESEARCH SUBJECT. Phase 2, randomized, double-blind, placebo-controlled study to evaluate the safety and tolerability of FG-3019 in subjects with IPF, and the efficacy of FG-3019 in slowing the loss of forced vital capacity (FVC) and the progression of IPF in these subjects. FG-3019 is a human recombinant monoclonal antibody that targets connective tissue growth factor (CTGF), which is a key factor in the pathogenesis of fibrosis. The ability of FG-3019 to block CTGF makes it a candidate for treating IPF.[facility] is sponsored by [employer].  [employer] is a [employer] located in [place], [place]. FG-3019 is administered as an IV infusion every three weeks .    Inclusion Criteria: IPF for </= 5 years, age 69-80 and FVC is >55% and your DLCO >/30% and body weight < 130kg  Key other data: side effects with infusion - Cough > 30%, Fatigue > 20%, Dyspnea > 20%, URI > 15%, Headache > 15%, Diarrhea > 15%. Rare is flushing, arm pain, back pain and hypertension. SEverity 75% is miild-moderate. > 74% have atleast 1 side effect over time.  No overall difference between placebo and drug   This visit 01/28/2016  for Tanner Bishop with [birth year] who is Subject Number [id number]  is a Research Visit and is for purpose of Week 18 infusion on PROTOCOL.  Patient presents at hospital, after previously being seen at outpatient office, for Week 18 infusion of study drug/placebo.  Patient reports no changes in health or medications.  Blood work drawn, per protocol.  This RN initiated infusion, and bedside nursing staff to continue administration and monitoring of patient throughout infusion and observation period.  This RN saw patient again at end of observation period.  Patient reports no changes.  Week 21 infusion scheduled, patient reminded that Week 21 was last infusion, and to hold morning dose of routine IPF medication until visit.  Patient demonstrated  understanding.  [name], RN

## 2016-01-28 NOTE — Progress Notes (Signed)
[facility] (ClinicalTrials.gov Identifier: [id number])  RESEARCH SUBJECT. Phase 2, randomized, double-blind, placebo-controlled study to evaluate the safety and tolerability of FG-3019 in subjects with IPF, and the efficacy of FG-3019 in slowing the loss of forced vital capacity (FVC) and the progression of IPF in these subjects. FG-3019 is a human recombinant monoclonal antibody that targets connective tissue growth factor (CTGF), which is a key factor in the pathogenesis of fibrosis. The ability of FG-3019 to block CTGF makes it a candidate for treating IPF.[facility] is sponsored by [employer].  [employer] is a [employer] located in [place], [place]. FG-3019 is administered as an IV infusion every three weeks .    Inclusion Criteria: IPF for </= 5 years, age 37-80 and FVC is >55% and your DLCO >/30% and body weight < 130kg  Key other data: side effects with infusion - Cough > 30%, Fatigue > 20%, Dyspnea > 20%, URI > 15%, Headache > 15%, Diarrhea > 15%. Rare is flushing, arm pain, back pain and hypertension. SEverity 75% is miild-moderate. > 74% have atleast 1 side effect over time.  No overall difference between placebo and drug   This visit 01/28/2016  for Tanner Bishop with [birth year] who is Subject Number [id number]  is a research Visit and is for purpose of treatment and is week 18 on PROTOCOL.  Subject returns with no complaints of new problems, he states he has no change to any medications or previous concerns.  He has 1 more treatment after today and then will return for his final assessment for the study. He will go to [facility] for his IV infusion

## 2016-01-28 NOTE — Patient Instructions (Signed)
Continue with research protocol .

## 2016-01-28 NOTE — Assessment & Plan Note (Signed)
Stable on current regimen  follow up [name] on return ov

## 2016-01-28 NOTE — Progress Notes (Signed)
Subjective:    Patient ID: Kinneth Fujiwara, male    DOB: 1946/07/10, 70 y.o.   MRN: [id number]  HPI  [facility] (ClinicalTrials.gov Identifier: [id number]) RESEARCH SUBJECT. Phase 2, randomized, double-blind, placebo-controlled study to evaluate the safety and tolerability of FG-3019 in subjects with IPF, and the efficacy of FG-3019 in slowing the loss of forced vital capacity (FVC) and the progression of IPF in these subjects. FG-3019 is a human recombinant monoclonal antibody that targets connective tissue growth factor (CTGF), which is a key factor in the pathogenesis of fibrosis. The ability of FG-3019 to block CTGF makes it a candidate for treating IPF.[facility] is sponsored by [employer]. [employer] is a [employer] located in [employer] [place], [place]. FG-3019 is administered as an IV infusion every three weeks .  Inclusion Criteria: IPF for </= 5 years, age 22-80 and FVC is >55% and your DLCO >/30% and body weight < 130kg    01/28/2016 Research Visit -Week 18 Fibrogen  Pt presents for week 18 research visit.  Subject [phone] is a research Visit and is for purpose of treatment follow up and is week 18  He says he is doing good no new complaints or change in medications.   Walks on treadmill 6 days a week.  Denies chest pain, flare of cough or dyspnea, hemoptysis , orthopnea, fever, headache or n/v. . Rash.  Says he has not noticed any side effects from infusion . Fatigue and dry cough are at baseline.     Review of Systems  See HPI     Objective:   Physical Exam  GEN: A/Ox3; pleasant , NAD, well nourished   VS reviewed   HEENT:  [place]/AT,  EACs-clear, TMs-wnl, NOSE-clear, THROAT-clear, no lesions, no postnasal drip or exudate noted.   NECK:  Supple w/ fair ROM; no JVD; normal carotid impulses w/o bruits; no thyromegaly or nodules palpated; no lymphadenopathy.  RESP  Faint BB crackles no accessory muscle use, no dullness to percussion  CARD:  RRR, no m/r/g  , no peripheral edema,  pulses intact, no cyanosis or clubbing.  GI:   Soft & nt; nml bowel sounds; no organomegaly or masses detected.  Musco: Warm bil, no deformities or joint swelling noted.   Neuro: alert, no focal deficits noted.    Skin: Warm, no lesions or rashes         Assessment & Plan:

## 2016-01-30 NOTE — Progress Notes (Signed)
Reviewed sub-I notes. No AE Noted. Agree with her plan for proceeding with IP infusion  Dr. Brand Males, M.D., [facility].C.P Pulmonary and Critical Care Medicine Staff Physician [place] [facility] Pager: [phone], If no answer or between  15:00h - 7:00h: call [phone]  01/30/2016 6:26 PM

## 2016-02-18 ENCOUNTER — Ambulatory Visit (HOSPITAL_COMMUNITY)
Admission: RE | Admit: 2016-02-18 | Discharge: 2016-02-18 | Disposition: A | Discharge status: Home / Self Care | Payer: Commercial Managed Care - HMO | Source: Ambulatory Visit | Attending: Internal Medicine | Admitting: Internal Medicine

## 2016-02-18 ENCOUNTER — Ambulatory Visit (INDEPENDENT_AMBULATORY_CARE_PROVIDER_SITE_OTHER): Payer: Commercial Managed Care - HMO | Admitting: Internal Medicine

## 2016-02-18 DIAGNOSIS — J84112 Idiopathic pulmonary fibrosis: Secondary | ICD-10-CM

## 2016-02-18 DIAGNOSIS — Z006 Encounter for examination for normal comparison and control in clinical research program: Secondary | ICD-10-CM | POA: Insufficient documentation

## 2016-02-18 MED ORDER — STUDY - INVESTIGATIONAL DRUG SIMPLE RECORD (ML)
125.0000 mL/h | Freq: Once | Status: AC
  Administered 2016-02-18: 125 mL/h via INTRAVENOUS
  Filled 2016-02-18: qty 240.6

## 2016-02-18 NOTE — Progress Notes (Signed)
[facility] (ClinicalTrials.gov Identifier: [id number])  RESEARCH SUBJECT. Phase 2, randomized, double-blind, placebo-controlled study to evaluate the safety and tolerability of FG-3019 in subjects with IPF, and the efficacy of FG-3019 in slowing the loss of forced vital capacity (FVC) and the progression of IPF in these subjects. FG-3019 is a human recombinant monoclonal antibody that targets connective tissue growth factor (CTGF), which is a key factor in the pathogenesis of fibrosis. The ability of FG-3019 to block CTGF makes it a candidate for treating IPF.[facility] is sponsored by [employer].  [employer] is a [employer] located in [place], [place]. FG-3019 is administered as an IV infusion every three weeks .    Inclusion Criteria: IPF for </= 5 years, age 7-80 and FVC is >55% and your DLCO >/30% and body weight < 130kg  Key other data: side effects with infusion - Cough > 30%, Fatigue > 20%, Dyspnea > 20%, URI > 15%, Headache > 15%, Diarrhea > 15%. Rare is flushing, arm pain, back pain and hypertension. SEverity 75% is miild-moderate. > 74% have atleast 1 side effect over time.  No overall difference between placebo and drug   This visit 02/18/2016  for Tanner Bishop with May 15, 1946 who is Subject Number [id number]  is a Research Visit and is for purpose of Week 21 infusion on PROTOCOL.  Patient presents at hospital today for Week 21 infusion of study drug/placebo.  Patient feels well, reports no changes in health or medications.  Pre-dose blood work is drawn per protocol at time of IV insertion.  This RN initiated infusion of study drug/placebo, patient tolerating well.  Patient took routine morning dose of Ofev at time of IV medication start.  This is the last infusion for patient, patient demonstrated understanding of this.  Study Coordinator [name] to contact patient to schedule Week 24 visit.  Patient seen again near end of observation period, no changes, patient tolerated  infusion well.  Post PK blood work drawn per protocol.  Patient seen again post observation period for remaining protocol required PK sample.  Specimen collected per protocol.  Patient continues to feel well, no changes.  [name], RN

## 2016-02-26 ENCOUNTER — Other Ambulatory Visit: Payer: Self-pay | Admitting: Internal Medicine

## 2016-02-26 DIAGNOSIS — J84112 Idiopathic pulmonary fibrosis: Secondary | ICD-10-CM

## 2016-02-26 DIAGNOSIS — Z006 Encounter for examination for normal comparison and control in clinical research program: Secondary | ICD-10-CM

## 2016-03-11 ENCOUNTER — Ambulatory Visit (INDEPENDENT_AMBULATORY_CARE_PROVIDER_SITE_OTHER)
Admission: RE | Admit: 2016-03-11 | Discharge: 2016-03-11 | Disposition: A | Discharge status: Home / Self Care | Payer: Self-pay | Source: Ambulatory Visit | Attending: Internal Medicine | Admitting: Internal Medicine

## 2016-03-11 DIAGNOSIS — J84112 Idiopathic pulmonary fibrosis: Secondary | ICD-10-CM

## 2016-03-11 DIAGNOSIS — Z006 Encounter for examination for normal comparison and control in clinical research program: Secondary | ICD-10-CM

## 2016-03-12 ENCOUNTER — Other Ambulatory Visit: Payer: Commercial Managed Care - HMO

## 2016-03-12 ENCOUNTER — Ambulatory Visit (INDEPENDENT_AMBULATORY_CARE_PROVIDER_SITE_OTHER): Payer: Commercial Managed Care - HMO | Admitting: Internal Medicine

## 2016-03-12 ENCOUNTER — Other Ambulatory Visit: Payer: Self-pay | Admitting: Internal Medicine

## 2016-03-12 VITALS — BP 110/80 | HR 79 | Temp 97.4°F | Resp 17 | Wt 174.0 lb

## 2016-03-12 DIAGNOSIS — Z006 Encounter for examination for normal comparison and control in clinical research program: Secondary | ICD-10-CM

## 2016-03-12 DIAGNOSIS — J84112 Idiopathic pulmonary fibrosis: Secondary | ICD-10-CM

## 2016-03-12 NOTE — Progress Notes (Signed)
[facility] (ClinicalTrials.gov Identifier: [id number])  RESEARCH SUBJECT. Phase 2, randomized, double-blind, placebo-controlled study to evaluate the safety and tolerability of FG-3019 in subjects with IPF, and the efficacy of FG-3019 in slowing the loss of forced vital capacity (FVC) and the progression of IPF in these subjects. FG-3019 is a human recombinant monoclonal antibody that targets connective tissue growth factor (CTGF), which is a key factor in the pathogenesis of fibrosis. The ability of FG-3019 to block CTGF makes it a candidate for treating IPF.[facility] is sponsored by [employer].  [employer] is a [employer] located in [place], [place]. FG-3019 is administered as an IV infusion every three weeks .    Inclusion Criteria: IPF for </= 5 years, age 36-80 and FVC is >55% and your DLCO >/30% and body weight < 130kg  Key other data: side effects with infusion - Cough > 30%, Fatigue > 20%, Dyspnea > 20%, URI > 15%, Headache > 15%, Diarrhea > 15%. Rare is flushing, arm pain, back pain and hypertension. SEverity 75% is miild-moderate. > 74% have atleast 1 side effect over time.  No overall difference between placebo and drug   This visit 03/12/2016  for Tanner Bishop with [birth year] who is Subject [id number]  is a research Visit and is for purpose of  End of study and is week 24 on PROTOCOL.  Subject returns for his final study visit.  Unfortunately the spirometry supplied by the sponsor is not working.  Monitor notified as well as icardiac.  Subject will need to return once problem is resolved.  Subject has also updated the latest informed consent version 7.  A discussion was had regarding changes and the subject had no questions.  He voluntarily signed consent on this day at 0920.  A copy was given to him.  All other study procedures were carried out per protocol.  The subject has no changes or new complaints at this time.  PI NOTE - fACE TO FACE VISIT  S:  He has finished a study  infusions. This a final study visit. Recent coronary findings noted above. He denies any new complaints or issues. Overall doing well. There is a safety follow-up next week  O Filed Vitals:   03/12/16 0915  BP: 110/80  Pulse: 79  Temp: 97.4 F (36.3 C)  TempSrc: Oral  Resp: 17  Weight: 174 lb (78.926 kg)  SpO2: 98%   Exam: Nonfocal other than bibasal crackles posteriorly in the lung on one third. Rest in  source documentation   High-resolution CT chest - March 2017: Mildly progressive since March 2014 but stable since August 2016 Spirometry today research: Machine not working for the severe protocol deviation Lab work: Ordered and he will do it EKG: Sinus rhythm. Incomplete right bundle branch block. Clinically not significant  A   ICD-9-CM ICD-10-CM   1. Research exam V70.7 Z00.6   2. 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 in 3 months   Dr. Brand Males, M.D., [facility].C.P Pulmonary and Critical Care Medicine Staff Physician [place] [facility] Pager: [phone], If no answer or between  15:00h - 7:00h: call [phone]  03/12/2016 10:17 AM

## 2016-03-12 NOTE — Patient Instructions (Signed)
ICD-9-CM ICD-10-CM   1. Research exam V70.7 Z00.6   2. IPF (idiopathic pulmonary fibrosis) ([place]) 516.31 J84.95    Disease appears clinically stable Do blood work today per research protocol Return for spirometry the research protocol and safety visit next week per research protocol  Do standard of care nonresearch IPF follow-up with [name] in 3 months

## 2016-03-18 ENCOUNTER — Ambulatory Visit (INDEPENDENT_AMBULATORY_CARE_PROVIDER_SITE_OTHER): Payer: Commercial Managed Care - HMO | Admitting: Internal Medicine

## 2016-03-18 DIAGNOSIS — Z006 Encounter for examination for normal comparison and control in clinical research program: Secondary | ICD-10-CM

## 2016-03-18 NOTE — Progress Notes (Signed)
PRA[facility] (ClinicalTrials.gov Identifier: [id number])  RESEARCH SUBJECT. Phase 2, randomized, double-blind, placebo-controlled study to evaluate the safety and tolerability of FG-3019 in subjects with IPF, and the efficacy of FG-3019 in slowing the loss of forced vital capacity (FVC) and the progression of IPF in these subjects. FG-3019 is a human recombinant monoclonal antibody that targets connective tissue growth factor (CTGF), which is a key factor in the pathogenesis of fibrosis. The ability of FG-3019 to block CTGF makes it a candidate for treating IPF.[facility]AISE is sponsored by [employer].  [employer] is a [employer] located in [place], [place]. FG-3019 is administered as an IV infusion every three weeks .    Inclusion Criteria: IPF for </= 5 years, age 29-80 and FVC is >55% and your DLCO >/30% and body weight < 130kg  Key other data: side effects with infusion - Cough > 30%, Fatigue > 20%, Dyspnea > 20%, URI > 15%, Headache > 15%, Diarrhea > 15%. Rare is flushing, arm pain, back pain and hypertension. SEverity 75% is miild-moderate. > 74% have atleast 1 side effect over time.  No overall difference between placebo and drug  CRC Note This visit 03/18/2016  for Tanner Bishop with 1946/07/03 who is Subject Number [id number] is a research Visit and is for purpose of PFT only.  Subject returns to complete the PFT required at Week 24.  The PFT machine was out of order on the day of his original Week 24.  Subject will return in 3 weeks to complete his final visit for this study.    PI overnsight - reviewed above info. Agree with plan  Dr. Brand Males, M.D., [facility].C.P Pulmonary and Critical Care Medicine Staff Physician [place] [facility] Pager: [phone], If no answer or between  15:00h - 7:00h: call [phone]  03/18/2016 10:42 PM

## 2016-04-07 ENCOUNTER — Ambulatory Visit (INDEPENDENT_AMBULATORY_CARE_PROVIDER_SITE_OTHER): Payer: Commercial Managed Care - HMO | Admitting: Internal Medicine

## 2016-04-07 VITALS — BP 120/78 | HR 63 | Temp 97.7°F | Resp 16 | Ht 70.0 in | Wt 179.0 lb

## 2016-04-07 DIAGNOSIS — J84112 Idiopathic pulmonary fibrosis: Secondary | ICD-10-CM

## 2016-04-07 DIAGNOSIS — Z006 Encounter for examination for normal comparison and control in clinical research program: Secondary | ICD-10-CM

## 2016-04-07 NOTE — Progress Notes (Signed)
[facility] (ClinicalTrials.gov Identifier: [id number])  RESEARCH SUBJECT. Phase 2, randomized, double-blind, placebo-controlled study to evaluate the safety and tolerability of FG-3019 in subjects with IPF, and the efficacy of FG-3019 in slowing the loss of forced vital capacity (FVC) and the progression of IPF in these subjects. FG-3019 is a human recombinant monoclonal antibody that targets connective tissue growth factor (CTGF), which is a key factor in the pathogenesis of fibrosis. The ability of FG-3019 to block CTGF makes it a candidate for treating IPF.[facility]AISE is sponsored by [employer].  [employer] is a [employer] located in [place], [place]. FG-3019 is administered as an IV infusion every three weeks .    Inclusion Criteria: IPF for </= 5 years, age 33-80 and FVC is >55% and your DLCO >/30% and body weight < 130kg  Key other data: side effects with infusion - Cough > 30%, Fatigue > 20%, Dyspnea > 20%, URI > 15%, Headache > 15%, Diarrhea > 15%. Rare is flushing, arm pain, back pain and hypertension. SEverity 75% is miild-moderate. > 74% have atleast 1 side effect over time.  No overall difference between placebo and drug   This visit 04/07/2016  for Tanner Bishop with [birth year] who is Subject Number [id number] is a follow up Visit and is for purpose of  research and is End of Study on PROTOCOL.  Subject returns for his final end of study visit.  All study requirements were completed per protocol.  Subject would like to be considered for Phase III should this become available.  He is returning in July for his routine appt with [name].

## 2016-06-03 ENCOUNTER — Other Ambulatory Visit: Payer: Self-pay

## 2016-06-03 MED ORDER — NINTEDANIB ESYLATE 150 MG PO CAPS: 1.0000 | ORAL_CAPSULE | Freq: Two times a day (BID) | ORAL | Status: DC

## 2016-06-29 ENCOUNTER — Other Ambulatory Visit (INDEPENDENT_AMBULATORY_CARE_PROVIDER_SITE_OTHER): Payer: Commercial Managed Care - HMO

## 2016-06-29 ENCOUNTER — Ambulatory Visit (INDEPENDENT_AMBULATORY_CARE_PROVIDER_SITE_OTHER): Payer: Commercial Managed Care - HMO | Admitting: Internal Medicine

## 2016-06-29 ENCOUNTER — Encounter: Payer: Self-pay | Admitting: Internal Medicine

## 2016-06-29 VITALS — BP 118/66 | HR 80 | Ht 70.0 in | Wt 171.2 lb

## 2016-06-29 DIAGNOSIS — Z5181 Encounter for therapeutic drug level monitoring: Secondary | ICD-10-CM

## 2016-06-29 DIAGNOSIS — J84112 Idiopathic pulmonary fibrosis: Secondary | ICD-10-CM

## 2016-06-29 LAB — HEPATIC FUNCTION PANEL
ALT: 9 U/L (ref 0–53)
AST: 18 U/L (ref 0–37)
Albumin: 4.1 g/dL (ref 3.5–5.2)
Alkaline Phosphatase: 50 U/L (ref 39–117)
Bilirubin, Direct: 0 mg/dL (ref 0.0–0.3)
Total Bilirubin: 0.3 mg/dL (ref 0.2–1.2)
Total Protein: 7.7 g/dL (ref 6.0–8.3)

## 2016-06-29 NOTE — Progress Notes (Signed)
Subjective:     Patient ID: Tanner Bishop, male   DOB: Aug 16, 1946, 70 y.o.   MRN: [id number]  HPI   #IPF  - On OFEV since early May 2015   PFT FVC fev1 ratio BD fev1 TLC DLCO Walk test 160f x 3 laps wt rx             Spring 2014 2.9:L L/% /%        June 2015 2.7L/67% 2.34L/78% 86  3.68/57% 20.24/71%   ofev start  [name]  2016 2.5L/55% 2.1L/60% 84/108%    No desat, Pk HR 130    02/11/2015 Screening visit afferent cough study 2.59L/56% 2.24L/63% 87/112%    Dx with afluttter on ekg   Screen failed due to hx of stones  03/26/2015        No desat, PK HR 130    08/26/15 pft machine 2.73L/68% 2.38L/80% 87   [name]     06/29/2016 Office spiro 2.49L/56% 2.14L/65%     Pk HR 90 and lowest pulse ox 99% ->93%  ofev       OV 03/26/2015  Chief Complaint  Patient presents with  . Follow-up    [name], dry cough. CATH procedure on 04/08/15. Denies any chest tightnes/congestion.    70year old male with idiopathic pulmonary fibrosis. He is on Ofev after having failed Esbriet in the past due to side effects. He is tolerating Ofev well. Liver function test in March 2016 was normal. I last saw him in January 2016. Subsequently in February 2016 we screened him for a cough idiopathic pulmonary fibrosis study called afferent but he screen failed due to history of renal stones. At this time he was incidentally diagnosed with new onset atrial flutter. He has seen Dr. GCrissie Sicklesand has been started on anticoagulation with Xarelto and metoprolol. He is due to undergo a ablation. He is not aware of the increased bleeding risk with Ofev in the setting of anticoagulation. He assures me that the anti-coag and is only until he undergoes ablation and after that the plan is to stop it. Therefore only short-term anticoagulation with Xarelto. Overall his effort tolerance is fine. He has postponed his [facility] lung transplant until he completes his ablation.  Walking desaturation test in the office  today he did not desaturate. This is stable. Spirometry not done   Past medical history: Atrial flutter new diagnoses as above   OV 06/29/2016  Chief Complaint  Patient presents with  . Follow-up    pt states he is at baseline: sob with exertion, nonprod cough.      70year old male with idiopathic pulmonary fibrosis. He is on Ofev . He completed 6 months of PRAISE study (([id number] placebo) in l;ate winter/early spring 2017. [place] Overall stable. No worsening dyspnea and cough. Continues daily exercise is walking many miles. Some days he is sitting more than the others. He is interested in more trials and results of the praise study. These results are not out yet. He is compliant with his Ofe last liver function test was in February 2017. There are no new issues. He believes his atrial fibrillation is in sinus rhythm;     has a past medical history of Ulcer; GERD (gastroesophageal reflux disease); Injury of right hand; Arthritis; Pulmonary fibrosis ([place]; Renal stone (06/2014); H/O hiatal hernia; Hyperlipidemia; and Atrial flutter ([place].   reports that he quit smoking about 37 years ago. His smoking use included Cigarettes. He has a  16 pack-year smoking history. He has never used smokeless tobacco.  Past Surgical History  Procedure Laterality Date  . Colonoscopy  02-24-12    adenomatous polyps removed, repeat in 5 yrs, per Dr. Deatra Ina   . Esophagogastroduodenoscopy  2007  . Hand surgery Right   . Video assisted thoracoscopy Left 03/22/2013    Procedure: VIDEO ASSISTED THORACOSCOPY;  Surgeon: Melrose Nakayama, MD;  Location: [place];  Service: Thoracic;  Laterality: Left;  . Lung biopsy Left 03/22/2013    Procedure: LUNG BIOPSY;  Surgeon: Melrose Nakayama, MD;  Location: [place];  Service: Thoracic;  Laterality: Left;  . Extracorporeal shock wave lithotripsy  06-17-14    per Dr. Jeffie Pollock   . Atrial flutter ablation N/A 04/08/2015    Procedure: ATRIAL FLUTTER ABLATION;  Surgeon:  Evans Lance, MD;  Location: [facility] CATH LAB;  Service: Cardiovascular;  Laterality: N/A;  . Cataract extraction, bilateral  2016    No Known Allergies  Immunization History  Administered Date(s) Administered  . Influenza Split 09/13/2013  . Influenza-Unspecified 09/07/2014, 09/14/2015  . Pneumococcal Polysaccharide-23 05/26/2013  . Zoster 02/19/2014    Family History  Problem Relation Age of Onset  . Diabetes    . Cancer      prostate  . Liver cancer Mother   . Diabetes Sister   . Lung cancer Sister   . Heart disease Brother   . Diabetes Brother   . Colon cancer Brother      Current outpatient prescriptions:  .  diphenhydrAMINE (BENADRYL) 25 MG tablet, Take 25 mg by mouth every 6 (six) hours as needed for allergies or sleep., Disp: , Rfl:  .  Nintedanib (OFEV) 150 MG CAPS, Take 1 tablet by mouth 2 (two) times daily., Disp: 60 capsule, Rfl: 5 .  pantoprazole (PROTONIX) 40 MG tablet, Take 1 tablet (40 mg total) by mouth 2 (two) times daily., Disp: 180 tablet, Rfl: 3 .  ranitidine (ZANTAC) 300 MG tablet, TAKE 1 TABLET BY MOUTH AT BEDTIME, Disp: 90 tablet, Rfl: 3 .  zolpidem (AMBIEN) 5 MG tablet, Take 1 tablet (5 mg total) by mouth at bedtime as needed for sleep., Disp: 30 tablet, Rfl: 5     Review of Systems     Objective:   Physical Exam  Constitutional: He is oriented to person, place, and time. He appears well-developed and well-nourished. No distress.  HENT:  Head: Normocephalic and atraumatic.  Right Ear: External ear normal.  Left Ear: External ear normal.  Mouth/Throat: Oropharynx is clear and moist. No oropharyngeal exudate.  Eyes: Conjunctivae and EOM are normal. Pupils are equal, round, and reactive to light. Right eye exhibits no discharge. Left eye exhibits no discharge. No scleral icterus.  Neck: Normal range of motion. Neck supple. No JVD present. No tracheal deviation present. No thyromegaly present.  Cardiovascular: Normal rate, regular rhythm and intact  distal pulses.  Exam reveals no gallop and no friction rub.   No murmur heard. Pulmonary/Chest: Effort normal. No respiratory distress. He has no wheezes. He has rales. He exhibits no tenderness.  Crackles bottom 1/4th  Abdominal: Soft. Bowel sounds are normal. He exhibits no distension and no mass. There is no tenderness. There is no rebound and no guarding.  Musculoskeletal: Normal range of motion. He exhibits no edema or tenderness.  Lymphadenopathy:    He has no cervical adenopathy.  Neurological: He is alert and oriented to person, place, and time. He has normal reflexes. No cranial nerve deficit. Coordination normal.  Skin:  Skin is warm and dry. No rash noted. He is not diaphoretic. No erythema. No pallor.  Psychiatric: He has a normal mood and affect. His behavior is normal. Judgment and thought content normal.  Nursing note and vitals reviewed.   Filed Vitals:   06/29/16 0907  BP: 118/66  Pulse: 80  Height: 5' 10" (1.778 m)  Weight: 171 lb 3.2 oz (77.656 kg)  SpO2: 100%        Assessment:     .   ICD-9-CM ICD-10-CM   1. IPF (idiopathic pulmonary fibrosis) (HCC) 516.31 J84.112 Spirometry with Graph     Pulmonary function test     Hepatic function panel  2. Encounter for therapeutic drug monitoring V58.83 Z51.81        Plan:       Clinically stalbe Would suspect small decline in breathing test as related to machine variation  Plan - do LFT blood test 06/29/2016  - do PFT with Tammy wilson (spiro and dlco) in 4 months  -continue Ofev as before - flu shot in fall - continue daily exercises - will let you know PRAISE study results when available   Followup  =  After pft With dr Brantley Persons in 4 months   Dr. Brand Males, M.D., [facility].C.P Pulmonary and Critical Care Medicine Staff Physician [place] [facility] Pager: [phone], If no answer or between  15:00h - 7:00h: call [phone]  06/29/2016 9:47 AM

## 2016-06-29 NOTE — Patient Instructions (Addendum)
ICD-9-CM ICD-10-CM   1. IPF (idiopathic pulmonary fibrosis) (HCC) 516.31 J84.112 Spirometry with Graph     Pulmonary function test     Hepatic function panel  2. Encounter for therapeutic drug monitoring V58.83 Z51.81    Clinically stalbe Would suspect small decline in breathing test as related to machine variation  Plan - do LFT blood test 06/29/2016  - do PFT with Tammy wilson (spiro and dlco) in 4 months  -continue Ofev as before - flu shot in fall - continue daily exercises - will let you know PRAISE study results when available   Followup  =  After pft With dr Brantley Persons in 4 months

## 2016-07-31 ENCOUNTER — Telehealth: Payer: Self-pay | Admitting: Internal Medicine

## 2016-07-31 NOTE — Telephone Encounter (Signed)
Lm x 1

## 2016-08-03 NOTE — Telephone Encounter (Signed)
Called and spoke with pt and he stated that the nurse he talked to stated that she feels he needs to come off of the ofev for several weeks.  He stated that he is unable to control his bowels at times and having lots of diarrhea.  The nurse told him to call our office to discuss this with MR.  MR please advise. Thanks  No Known Allergies

## 2016-08-03 NOTE — Telephone Encounter (Signed)
See if he can cut it down to 122m ofev once daily and see if that is ok. I would recommed this as first step  Thanks  Dr. MBrand Males M.D., FWekiva SpringsC.P Pulmonary and Critical Care Medicine Staff Physician [facility] and [facility] Pager: [phone] If no answer or between  15:00h - 7:00h: call [phone]  08/03/2016 3:01 PM

## 2016-08-03 NOTE — Telephone Encounter (Signed)
[phone], pt cb

## 2016-08-03 NOTE — Telephone Encounter (Signed)
Called spoke with pt. Reviewed MR's recs. He voiced understanding and had no further questions. Nothing further needed at this time.

## 2016-09-25 ENCOUNTER — Other Ambulatory Visit: Payer: Self-pay | Admitting: Internal Medicine

## 2016-11-02 ENCOUNTER — Encounter: Payer: Self-pay | Admitting: Internal Medicine

## 2016-11-02 ENCOUNTER — Ambulatory Visit (INDEPENDENT_AMBULATORY_CARE_PROVIDER_SITE_OTHER): Payer: Commercial Managed Care - HMO | Admitting: Internal Medicine

## 2016-11-02 ENCOUNTER — Other Ambulatory Visit (INDEPENDENT_AMBULATORY_CARE_PROVIDER_SITE_OTHER): Payer: Commercial Managed Care - HMO

## 2016-11-02 VITALS — BP 112/64 | HR 65 | Ht 67.0 in | Wt 174.0 lb

## 2016-11-02 DIAGNOSIS — Z5181 Encounter for therapeutic drug level monitoring: Secondary | ICD-10-CM | POA: Diagnosis not present

## 2016-11-02 DIAGNOSIS — J84112 Idiopathic pulmonary fibrosis: Secondary | ICD-10-CM | POA: Diagnosis not present

## 2016-11-02 DIAGNOSIS — R0982 Postnasal drip: Secondary | ICD-10-CM | POA: Diagnosis not present

## 2016-11-02 DIAGNOSIS — J209 Acute bronchitis, unspecified: Secondary | ICD-10-CM

## 2016-11-02 DIAGNOSIS — R197 Diarrhea, unspecified: Secondary | ICD-10-CM

## 2016-11-02 LAB — PULMONARY FUNCTION TEST
DL/VA % pred: 113 %
DL/VA: 5 ml/min/mmHg/L
DLCO cor % pred: 63 %
DLCO cor: 18.05 ml/min/mmHg
DLCO unc % pred: 60 %
DLCO unc: 17.23 ml/min/mmHg
FEF 25-75 Pre: 3.49 L/sec
FEF2575-%Pred-Pre: 156 %
FEV1-%Pred-Pre: 79 %
FEV1-Pre: 2.3 L
FEV1FVC-%Pred-Pre: 119 %
FEV6-%Pred-Pre: 70 %
FEV6-Pre: 2.61 L
FEV6FVC-%Pred-Pre: 106 %
FVC-%Pred-Pre: 66 %
FVC-Pre: 2.61 L
Pre FEV1/FVC ratio: 88 %
Pre FEV6/FVC Ratio: 100 %

## 2016-11-02 LAB — HEPATIC FUNCTION PANEL
ALT: 11 U/L (ref 0–53)
AST: 19 U/L (ref 0–37)
Albumin: 4.2 g/dL (ref 3.5–5.2)
Alkaline Phosphatase: 49 U/L (ref 39–117)
Bilirubin, Direct: 0 mg/dL (ref 0.0–0.3)
Total Bilirubin: 0.3 mg/dL (ref 0.2–1.2)
Total Protein: 7.5 g/dL (ref 6.0–8.3)

## 2016-11-02 MED ORDER — FLUTICASONE PROPIONATE 50 MCG/ACT NA SUSP: 2.0000 | Freq: Every day | NASAL | 5 refills | Status: DC

## 2016-11-02 MED ORDER — AZITHROMYCIN 250 MG PO TABS: ORAL_TABLET | ORAL | 0 refills | Status: DC

## 2016-11-02 MED ORDER — PREDNISONE 10 MG PO TABS: ORAL_TABLET | ORAL | 0 refills | Status: DC

## 2016-11-02 NOTE — Progress Notes (Signed)
Subjective:     Patient ID: Tanner Bishop, male   DOB: [birth year], 70 y.o.   MRN: [id number]  HPI   #IPF - uip path 03/22/13  - On OFEV since early May 2015   PFT FVC fev1 ratio BD fev1 TLC DLCO Walk test 175f x 3 laps wt rx             Spring 2014 2.9:L L/% /%        June 2015 2.7L/67% 2.34L/78% 86  3.68/57% 20.24/71%   ofev start  [name]  2016 2.5L/55% 2.1L/60% 84/108%    No desat, Pk HR 130    02/11/2015 Screening visit afferent cough study 2.59L/56% 2.24L/63% 87/112%    Dx with afluttter on ekg   Screen failed due to hx of stones  03/26/2015        No desat, PK HR 130    08/26/15 pft machine 2.73L/68% 2.38L/80% 87   [name]     06/29/2016 Office spiro 2.49L/56% 2.14L/65%     Pk HR 90 and lowest pulse ox 99% ->93%  ofev  11/02/2016  office full pft  2.6qL/66% 2.3L/79%    17.23/60%   pfev       OV 03/26/2015  Chief Complaint  Patient presents with  . Follow-up    [name], dry cough. CATH procedure on 04/08/15. Denies any chest tightnes/congestion.    70year old male with idiopathic pulmonary fibrosis. He is on Ofev after having failed Esbriet in the past due to side effects. He is tolerating Ofev well. Liver function test in March 2016 was normal. I last saw him in January 2016. Subsequently in February 2016 we screened him for a cough idiopathic pulmonary fibrosis study called afferent but he screen failed due to history of renal stones. At this time he was incidentally diagnosed with new onset atrial flutter. He has seen Dr. GCrissie Sicklesand has been started on anticoagulation with Xarelto and metoprolol. He is due to undergo a ablation. He is not aware of the increased bleeding risk with Ofev in the setting of anticoagulation. He assures me that the anti-coag and is only until he undergoes ablation and after that the plan is to stop it. Therefore only short-term anticoagulation with Xarelto. Overall his effort tolerance is fine. He has postponed his [facility] lung  transplant until he completes his ablation.  Walking desaturation test in the office today he did not desaturate. This is stable. Spirometry not done   Past medical history: Atrial flutter new diagnoses as above   OV 06/29/2016  Chief Complaint  Patient presents with  . Follow-up    pt states he is at baseline: sob with exertion, nonprod cough.      70year old male with idiopathic pulmonary fibrosis. He is on Ofev . He completed 6 months of PRAISE study (([id number] placebo) in l;ate winter/early spring 2017. [place]. Overall stable. No worsening dyspnea and cough. Continues daily exercise is walking many miles. Some days he is sitting more than the others. He is interested in more trials and results of the praise study. These results are not out yet. He is compliant with his Ofe last liver function test was in February 2017. There are no new issues. He believes his atrial fibrillation is in sinus rhythm;    OV  11/02/2016  Chief Complaint  Patient presents with  . Follow-up    4 month IPF follow up and B&A review - does report increased prod cough  with yellow mucus, wheezing, tightness in chest, increased SOB, x3-4 weeks with the weather change.  denies any f/c/s, hemoptysis, chest pain   IPF - on ofev. Last liver function test was in July 2017 and normal. Overall dyspnea stable. However he is been having diarrhea with ofev. He called in and we reduced it to 1 tablet a day and this improved the diarrhea. He back on 1 tablet twice a day. The diarrhea has returned although it is much milder. He has never taken any medication for this. Are function test shows slight improvement from July 2017 and similar levels to approximately one year ago  The new issue that he's having significant recurrent sinus infection in the back of chronic postnasal drainage. This since the fall 2017. He says that he and his wife catch respirator infection and passive back and forth to each other. He is  having cough, chest tightness, postnasal drip and yellow sputum. It is not affecting any dyspnea. There is no wheeze. There is no fever or hemoptysis.    has a past medical history of Arthritis; Atrial flutter ([place]); GERD (gastroesophageal reflux disease); H/O hiatal hernia; Hyperlipidemia; Injury of right hand; Pulmonary fibrosis ([place]); Renal stone (06/2014); and Ulcer ([place]).   reports that he quit smoking about 37 years ago. His smoking use included Cigarettes. He has a 16.00 pack-year smoking history. He has never used smokeless tobacco.  Past Surgical History:  Procedure Laterality Date  . ATRIAL FLUTTER ABLATION N/A 04/08/2015   Procedure: ATRIAL FLUTTER ABLATION;  Surgeon: Evans Lance, MD;  [facility] C[facility] LAB;  Service: Cardiovascular;  Laterality: N/A;  . CATARACT EXTRACTION, BILATERAL  2016  . COLONOSCOPY  02-24-12   adenomatous polyps removed, repeat in 5 yrs, per Dr. Deatra Ina   . ESOPHAGOGASTRODUODENOSCOPY  2007  . EXTRACORPOREAL SHOCK WAVE LITHOTRIPSY  06-17-14   per Dr. Jeffie Pollock   . HAND SURGERY Right   . LUNG BIOPSY Left 03/22/2013   Procedure: LUNG BIOPSY;  Surgeon: Melrose Nakayama, MD;  Location: [place];  Service: Thoracic;  Laterality: Left;  Marland Kitchen VIDEO ASSISTED THORACOSCOPY Left 03/22/2013   Procedure: VIDEO ASSISTED THORACOSCOPY;  Surgeon: Melrose Nakayama, MD;  Location: [place];  Service: Thoracic;  Laterality: Left;    No Known Allergies  Immunization History  Administered Date(s) Administered  . Influenza Split 09/13/2013  . Influenza, High Dose Seasonal PF 10/06/2016  . Influenza-Unspecified 09/07/2014, 09/14/2015  . Pneumococcal Polysaccharide-23 05/26/2013  . Zoster 02/19/2014    Family History  Problem Relation Age of Onset  . Diabetes    . Cancer      prostate  . Liver cancer Mother   . Diabetes Sister   . Lung cancer Sister   . Heart disease Brother   . Diabetes Brother   . Colon cancer Brother      Current Outpatient Prescriptions:  .   diphenhydrAMINE (BENADRYL) 25 MG tablet, Take 25 mg by mouth every 6 (six) hours as needed for allergies or sleep., Disp: , Rfl:  .  Nintedanib (OFEV) 150 MG CAPS, Take 1 tablet by mouth 2 (two) times daily., Disp: 60 capsule, Rfl: 5 .  pantoprazole (PROTONIX) 40 MG tablet, Take 1 tablet (40 mg total) by mouth 2 (two) times daily., Disp: 180 tablet, Rfl: 3 .  ranitidine (ZANTAC) 300 MG tablet, TAKE 1 TABLET BY MOUTH AT BEDTIME, Disp: 90 tablet, Rfl: 1 .  zolpidem (AMBIEN) 5 MG tablet, Take 1 tablet (5 mg total) by mouth at bedtime as  needed for sleep., Disp: 30 tablet, Rfl: 5    Review of Systems     Objective:   Physical Exam  Constitutional: He is oriented to person, place, and time. He appears well-developed and well-nourished. No distress.  HENT:  Head: Normocephalic and atraumatic.  Right Ear: External ear normal.  Left Ear: External ear normal.  Mouth/Throat: Oropharynx is clear and moist. No oropharyngeal exudate.  Significant postnasal drainage present  Eyes: Conjunctivae and EOM are normal. Pupils are equal, round, and reactive to light. Right eye exhibits no discharge. Left eye exhibits no discharge. No scleral icterus.  Neck: Normal range of motion. Neck supple. No JVD present. No tracheal deviation present. No thyromegaly present.  Cardiovascular: Normal rate, regular rhythm and intact distal pulses.  Exam reveals no gallop and no friction rub.   No murmur heard. Pulmonary/Chest: Effort normal. No respiratory distress. He has no wheezes. He has rales. He exhibits no tenderness.  Abdominal: Soft. Bowel sounds are normal. He exhibits no distension and no mass. There is no tenderness. There is no rebound and no guarding.  Musculoskeletal: Normal range of motion. He exhibits no edema or tenderness.  Lymphadenopathy:    He has no cervical adenopathy.  Neurological: He is alert and oriented to person, place, and time. He has normal reflexes. No cranial nerve deficit. Coordination  normal.  Skin: Skin is warm and dry. No rash noted. He is not diaphoretic. No erythema. No pallor.  Psychiatric: He has a normal mood and affect. His behavior is normal. Judgment and thought content normal.  Nursing note and vitals reviewed.  Vitals:   11/02/16 1100  BP: 112/64  Pulse: 65  SpO2: 98%  Weight: 174 lb (78.9 kg)  Height: 5' 7" (1.702 m)    Estimated body mass index is 27.25 kg/m as calculated from the following:   Height as of this encounter: 5' 7" (1.702 m).   Weight as of this encounter: 174 lb (78.9 kg).      Assessment:       ICD-9-CM ICD-10-CM   1. IPF (idiopathic pulmonary fibrosis) (HCC) 516.31 J84.112   2. Encounter for therapeutic drug monitoring V58.83 Z51.81   3. Diarrhea, unspecified type 787.91 R19.7   4. Post-nasal drainage 473.9 R09.82   5. Acute bronchitis, unspecified organism 466.0 J20.9        Plan:      IPF Improved PFT from July 2017 and similar to approx 1 year ago Continue ofev but understand that there is diarrhea issue Check LFT Start lomotil 83m once or twice daily as needed   For sinus  - Z pak - Please take prednisone 40 mg x1 day, then 30 mg x1 day, then 20 mg x1 day, then 10 mg x1 day, and then 5 mg x1 day and stop -  take generic fluticasone inhaler 2 squirts each nostril daily - if no improvement over time will consider nitric oxide testing and sinus CT  Followup  420monthor sooner if needed  Dr. MuBrand MalesM.D., F.[facility].P Pulmonary and Critical Care Medicine Staff Physician [place]ulmonary and Critical Care Pager: [phone] no answer or between  15:00h - 7:00h: call [phone]  11/02/2016 11:34 AM

## 2016-11-02 NOTE — Patient Instructions (Addendum)
ICD-9-CM ICD-10-CM   1. IPF (idiopathic pulmonary fibrosis) (HCC) 516.31 J84.112   2. Encounter for therapeutic drug monitoring V58.83 Z51.81   3. Diarrhea, unspecified type 787.91 R19.7   4. Post-nasal drainage 473.9 R09.82   5. Acute bronchitis, unspecified organism 466.0 J20.9    IPF Continue ofev but understand that there is diarrhea issue Check LFT Start lomotil 86m once or twice daily as needed   For sinus  - Z pak - Please take prednisone 40 mg x1 day, then 30 mg x1 day, then 20 mg x1 day, then 10 mg x1 day, and then 5 mg x1 day and stop -  take generic fluticasone inhaler 2 squirts each nostril daily - if no improvement over time will consider nitric oxide testing and sinus CT  Followup  431monthor sooner if needed

## 2016-11-03 NOTE — Progress Notes (Signed)
Called and spoke to pt. Informed pt of the results per MR. Pt verbalized understanding and denied any further questions or concerns at this time.

## 2016-11-03 NOTE — Progress Notes (Signed)
pft  

## 2016-11-11 ENCOUNTER — Ambulatory Visit (INDEPENDENT_AMBULATORY_CARE_PROVIDER_SITE_OTHER): Payer: Commercial Managed Care - HMO | Admitting: Adult Health

## 2016-11-11 ENCOUNTER — Encounter: Payer: Self-pay | Admitting: Adult Health

## 2016-11-11 VITALS — BP 124/60 | Temp 97.6°F | Wt 173.3 lb

## 2016-11-11 DIAGNOSIS — J320 Chronic maxillary sinusitis: Secondary | ICD-10-CM | POA: Diagnosis not present

## 2016-11-11 MED ORDER — DOXYCYCLINE HYCLATE 100 MG PO CAPS: 100.0000 mg | ORAL_CAPSULE | Freq: Two times a day (BID) | ORAL | 0 refills | Status: DC

## 2016-11-11 NOTE — Progress Notes (Signed)
Subjective:    Patient ID: Tanner Bishop, male    DOB: 12/26/1945, 70 y.o.   MRN: [id number]  HPI  70 year old male who  has a past medical history of Arthritis; Atrial flutter ([place]); GERD (gastroesophageal reflux disease); H/O hiatal hernia; Hyperlipidemia; Injury of right hand; Pulmonary fibrosis ([place]); Renal stone (06/2014); and Ulcer ([place]).  He presents to the office today for the complaint of of  Was seen 9 days ago by pulmonary and was prescribed Z-pack and prednisone taper. He reports that while he was taking the medications  " I felt great but then once I finished up the sinus infection came back."   He reports continued sinus pain and pressure, nasal drainage and fevers at night. He reports that his cough has improved but that he still has some productive cough.   He has a history of chronic sinus infections  He is using Mucinex and Flonase  Review of Systems  Constitutional: Positive for activity change, fatigue and fever. Negative for chills.  HENT: Positive for congestion, postnasal drip, rhinorrhea, sinus pain and sinus pressure. Negative for ear pain.   Respiratory: Positive for cough.   Cardiovascular: Negative.   Gastrointestinal: Negative.   Musculoskeletal: Negative.   Neurological: Negative.   All other systems reviewed and are negative.  Past Medical History:  Diagnosis Date  . Arthritis   . Atrial flutter ([place])    a. s/p ablation 03/2015 Dr Lovena Le  . GERD (gastroesophageal reflux disease)   . H/O hiatal hernia   . Hyperlipidemia   . Injury of right hand    permanent damage after workplace 2009 and 2010 Dr Vernona Rieger [facility]  . Pulmonary fibrosis ([place])    sees Dr. Onnie Graham   . Renal stone 06/2014  . Ulcer ([place])    unresolved    Social History   Social History  . Marital status: Married    Spouse name: N/A  . Number of children: N/A  . Years of education: N/A   Occupational History  . 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 Smoking status: Former Smoker    Packs/day: 1.00    Years: 16.00    Types: Cigarettes    Quit date: 12/14/1978  . Smokeless tobacco: Never Used  . Alcohol use No  . Drug use: No  . Sexual activity: Not on file   Other Topics Concern  . Not on file   Social History Narrative  . No narrative on file    Past Surgical History:  Procedure Laterality Date  . ATRIAL FLUTTER ABLATION N/A 04/08/2015   Procedure: ATRIAL FLUTTER ABLATION;  Surgeon: Evans Lance, MD;  Location: [facility] CATH LAB;  Service: Cardiovascular;  Laterality: N/A;  . CATARACT EXTRACTION, BILATERAL  2016  . COLONOSCOPY  02-24-12   adenomatous polyps removed, repeat in 5 yrs, per Dr. Deatra Ina   . ESOPHAGOGASTRODUODENOSCOPY  2007  . EXTRACORPOREAL SHOCK WAVE LITHOTRIPSY  06-17-14   per Dr. Jeffie Pollock   . HAND SURGERY Right   . LUNG BIOPSY Left 03/22/2013   Procedure: LUNG BIOPSY;  Surgeon: Melrose Nakayama, MD;  Location: [facility];  Service: Thoracic;  Laterality: Left;  Marland Kitchen VIDEO ASSISTED THORACOSCOPY Left 03/22/2013   Procedure: VIDEO ASSISTED THORACOSCOPY;  Surgeon: Melrose Nakayama, MD;  [facility] OR;  Service: Thoracic;  Laterality: Left;    Family History  Problem Relation Age of Onset  . Diabetes    .  Cancer      prostate  . Liver cancer Mother   . Diabetes Sister   . Lung cancer Sister   . Heart disease Brother   . Diabetes Brother   . Colon cancer Brother     No Known Allergies  Current Outpatient Prescriptions on File Prior to Visit  Medication Sig Dispense Refill  . diphenhydrAMINE (BENADRYL) 25 MG tablet Take 25 mg by mouth every 6 (six) hours as needed for allergies or sleep.    . fluticasone (FLONASE) 50 MCG/ACT nasal spray Place 2 sprays into both nostrils daily. 16 g 5  . Nintedanib (OFEV) 150 MG CAPS Take 1 tablet by mouth 2 (two) times daily. 60 capsule 5  . pantoprazole (PROTONIX) 40 MG tablet Take 1 tablet (40 mg total) by mouth 2 (two) times daily. 180 tablet 3   . ranitidine (ZANTAC) 300 MG tablet TAKE 1 TABLET BY MOUTH AT BEDTIME 90 tablet 1  . zolpidem (AMBIEN) 5 MG tablet Take 1 tablet (5 mg total) by mouth at bedtime as needed for sleep. 30 tablet 5  . azithromycin (ZITHROMAX) 250 MG tablet Take as directed 6 tablet 0  . predniSONE (DELTASONE) 10 MG tablet 40 mg x1 day, then 30 mg x1 day, then 20 mg x1 day, then 10 mg x1 day, and then 5 mg x1 day and stop 11 tablet 0   No current facility-administered medications on file prior to visit.     BP 124/60   Temp 97.6 F (36.4 C) (Oral)   Wt 173 lb 4.8 oz (78.6 kg)   BMI 27.14 kg/m       Objective:   Physical Exam  Constitutional: He is oriented to person, place, and time. He appears well-developed and well-nourished. No distress.  HENT:  Head: Normocephalic and atraumatic.  Right Ear: External ear normal.  Left Ear: External ear normal.  Nose: Mucosal edema and rhinorrhea present. Right sinus exhibits maxillary sinus tenderness. Right sinus exhibits no frontal sinus tenderness. Left sinus exhibits maxillary sinus tenderness. Left sinus exhibits no frontal sinus tenderness.  Mouth/Throat: Mucous membranes are normal. Oropharyngeal exudate present. No posterior oropharyngeal edema, posterior oropharyngeal erythema or tonsillar abscesses.  Eyes: Conjunctivae and EOM are normal. Pupils are equal, round, and reactive to light. Right eye exhibits no discharge. Left eye exhibits no discharge. No scleral icterus.  Neck: Normal range of motion. Neck supple. No thyromegaly present.  Cardiovascular: Normal rate, regular rhythm, normal heart sounds and intact distal pulses.  Exam reveals no gallop and no friction rub.   No murmur heard. Pulmonary/Chest: Effort normal and breath sounds normal. No respiratory distress. He has no wheezes. He has no rales. He exhibits no tenderness.  Lymphadenopathy:    He has no cervical adenopathy.  Neurological: He is alert and oriented to person, place, and time.   Skin: Skin is warm and dry. No rash noted. He is not diaphoretic. No erythema. No pallor.  Psychiatric: He has a normal mood and affect. His behavior is normal. Judgment and thought content normal.  Nursing note and vitals reviewed.     Assessment & Plan:  1. Chronic maxillary sinusitis - Will treat with doxycycline.  - doxycycline (VIBRAMYCIN) 100 MG capsule; Take 1 capsule (100 mg total) by mouth 2 (two) times daily.  Dispense: 20 capsule; Refill: 0 - Continue with Flonase and Mucinex - Follow up with PCP if no improvement  Dorothyann Peng, NP

## 2016-11-11 NOTE — Progress Notes (Signed)
Pre visit review using our clinic review tool, if applicable. No additional management support is needed unless otherwise documented below in the visit note.

## 2016-12-10 ENCOUNTER — Telehealth: Payer: Self-pay | Admitting: Internal Medicine

## 2016-12-10 MED ORDER — HYDROCOD POLST-CPM POLST ER 10-8 MG/5ML PO SUER: 5.0000 mL | Freq: Two times a day (BID) | ORAL | 0 refills | Status: DC | PRN

## 2016-12-10 NOTE — Telephone Encounter (Signed)
Ok to mail Rx Tussionex 140 ml    5 ml every 12 hours if needed for cough

## 2016-12-10 NOTE — Telephone Encounter (Signed)
Spoke with pt.  C/o increased cough worse at night and causing difficulty sleeping.  Occas prod cough during the day but mostly clear sputum.  Finished Doxycycline recently from primary md for sinusitis. Pt is requesting a rx for Tussionex that Dr Chase Caller has prescribed in the past.  Please advise.  Sending to doc of day.  No Known Allergies   Current Outpatient Prescriptions on File Prior to Visit  Medication Sig Dispense Refill  . diphenhydrAMINE (BENADRYL) 25 MG tablet Take 25 mg by mouth every 6 (six) hours as needed for allergies or sleep.    . fluticasone (FLONASE) 50 MCG/ACT nasal spray Place 2 sprays into both nostrils daily. 16 g 5  . Nintedanib (OFEV) 150 MG CAPS Take 1 tablet by mouth 2 (two) times daily. 60 capsule 5  . pantoprazole (PROTONIX) 40 MG tablet Take 1 tablet (40 mg total) by mouth 2 (two) times daily. 180 tablet 3  . ranitidine (ZANTAC) 300 MG tablet TAKE 1 TABLET BY MOUTH AT BEDTIME 90 tablet 1  . zolpidem (AMBIEN) 5 MG tablet Take 1 tablet (5 mg total) by mouth at bedtime as needed for sleep. 30 tablet 5   No current facility-administered medications on file prior to visit.

## 2016-12-10 NOTE — Telephone Encounter (Signed)
Spoke with pt and advised that rx was printed and signed.  Pt requested to come pick up rx tomorrow.  Rx left at front desk for pick up.

## 2016-12-11 ENCOUNTER — Encounter: Payer: Self-pay | Admitting: Adult Health

## 2016-12-11 ENCOUNTER — Telehealth: Payer: Self-pay | Admitting: Adult Health

## 2016-12-11 ENCOUNTER — Ambulatory Visit (INDEPENDENT_AMBULATORY_CARE_PROVIDER_SITE_OTHER)
Admission: RE | Admit: 2016-12-11 | Discharge: 2016-12-11 | Disposition: A | Discharge status: Home / Self Care | Payer: Commercial Managed Care - HMO | Source: Ambulatory Visit | Attending: Adult Health | Admitting: Adult Health

## 2016-12-11 ENCOUNTER — Ambulatory Visit (INDEPENDENT_AMBULATORY_CARE_PROVIDER_SITE_OTHER): Payer: Commercial Managed Care - HMO | Admitting: Adult Health

## 2016-12-11 VITALS — BP 126/76 | Temp 99.1°F | Ht 67.0 in | Wt 172.2 lb

## 2016-12-11 DIAGNOSIS — M545 Low back pain, unspecified: Secondary | ICD-10-CM

## 2016-12-11 DIAGNOSIS — R05 Cough: Secondary | ICD-10-CM

## 2016-12-11 DIAGNOSIS — R059 Cough, unspecified: Secondary | ICD-10-CM

## 2016-12-11 MED ORDER — BENZONATATE 200 MG PO CAPS: 200.0000 mg | ORAL_CAPSULE | Freq: Two times a day (BID) | ORAL | 0 refills | Status: DC | PRN

## 2016-12-11 MED ORDER — CYCLOBENZAPRINE HCL 10 MG PO TABS: 10.0000 mg | ORAL_TABLET | Freq: Three times a day (TID) | ORAL | 0 refills | Status: DC | PRN

## 2016-12-11 NOTE — Progress Notes (Signed)
Subjective:    Patient ID: Tanner Bishop, male    DOB: [birth year], 70 y.o.   MRN: [id number]  HPI  70 year old male who presents to the office today for continued cough and low back pain. The low back pain complaint started this morning.  He reports that when he woke up this morning he had "severe" low back pain and this concerned him. He reports that when he is not coughing " the pain is just sore, but when I start coughing the pain just about brings me down" Pain is located bilateral lower back and worse with movements and palpation   He received Tussionex yesterday from Dr. Annamaria Boots but has not picked it up yet.    Review of Systems  Respiratory: Positive for cough, chest tightness and wheezing.   Cardiovascular: Negative.   Genitourinary: Negative.   Musculoskeletal: Positive for back pain and gait problem.  Skin: Negative.   All other systems reviewed and are negative.  Past Medical History:  Diagnosis Date  . Arthritis   . Atrial flutter ([place])    a. s/p ablation 03/2015 Dr Lovena Le  . GERD (gastroesophageal reflux disease)   . H/O hiatal hernia   . Hyperlipidemia   . Injury of right hand    permanent damage after workplace 2009 and 2010 Dr Vernona Rieger [facility]  . Pulmonary fibrosis ([place])    sees Dr. Onnie Graham   . Renal stone 06/2014  . Ulcer ([place])    unresolved    Social History   Social History  . Marital status: Married    Spouse name: N/A  . Number of children: N/A  . 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 Smoking status: Former Smoker    Packs/day: 1.00    Years: 16.00    Types: Cigarettes    Quit date: 12/14/1978  . Smokeless tobacco: Never Used  . Alcohol use No  . Drug use: No  . Sexual activity: Not on file   Other Topics Concern  . Not on file   Social History Narrative  . No narrative on file    Past Surgical History:  Procedure Laterality Date  .  ATRIAL FLUTTER ABLATION N/A 04/08/2015   Procedure: ATRIAL FLUTTER ABLATION;  Surgeon: Evans Lance, MD;  Location: [facility] CATH LAB;  Service: Cardiovascular;  Laterality: N/A;  . CATARACT EXTRACTION, BILATERAL  2016  . COLONOSCOPY  02-24-12   adenomatous polyps removed, repeat in 5 yrs, per Dr. Deatra Ina   . ESOPHAGOGASTRODUODENOSCOPY  2007  . EXTRACORPOREAL SHOCK WAVE LITHOTRIPSY  06-17-14   per Dr. Jeffie Pollock   . HAND SURGERY Right   . LUNG BIOPSY Left 03/22/2013   Procedure: LUNG BIOPSY;  Surgeon: Melrose Nakayama, MD;  Location: [place];  Service: Thoracic;  Laterality: Left;  Marland Kitchen VIDEO ASSISTED THORACOSCOPY Left 03/22/2013   Procedure: VIDEO ASSISTED THORACOSCOPY;  Surgeon: Melrose Nakayama, MD;  Location: [facility] OR;  Service: Thoracic;  Laterality: Left;    Family History  Problem Relation Age of Onset  . Diabetes    . Cancer      prostate  . Liver cancer Mother   . Diabetes Sister   . Lung cancer Sister   . Heart disease Brother   . Diabetes Brother   . Colon cancer Brother     No Known Allergies  Current Outpatient Prescriptions on File Prior to Visit  Medication Sig Dispense Refill  . chlorpheniramine-HYDROcodone (TUSSIONEX PENNKINETIC ER) 10-8 MG/5ML SUER Take 5 mLs by mouth every 12 (twelve) hours as needed for cough. 140 mL 0  . diphenhydrAMINE (BENADRYL) 25 MG tablet Take 25 mg by mouth every 6 (six) hours as needed for allergies or sleep.    . fluticasone (FLONASE) 50 MCG/ACT nasal spray Place 2 sprays into both nostrils daily. 16 g 5  . Nintedanib (OFEV) 150 MG CAPS Take 1 tablet by mouth 2 (two) times daily. 60 capsule 5  . pantoprazole (PROTONIX) 40 MG tablet Take 1 tablet (40 mg total) by mouth 2 (two) times daily. 180 tablet 3  . ranitidine (ZANTAC) 300 MG tablet TAKE 1 TABLET BY MOUTH AT BEDTIME 90 tablet 1  . zolpidem (AMBIEN) 5 MG tablet Take 1 tablet (5 mg total) by mouth at bedtime as needed for sleep. 30 tablet 5   No current facility-administered medications on file  prior to visit.     BP 126/76   Temp 99.1 F (37.3 C) (Oral)   Ht _0  (1.702 m)   Wt 172 lb 3.2 oz (78.1 kg)   BMI 26.97 kg/m       Objective:   Physical Exam  Constitutional: He is oriented to person, place, and time. He appears well-developed and well-nourished. No distress.  Appears in pain   Cardiovascular: Normal rate, regular rhythm, normal heart sounds and intact distal pulses.  Exam reveals no gallop and no friction rub.   No murmur heard. Pulmonary/Chest: Effort normal. No respiratory distress. He has wheezes (trace in lower lung fields ). He has no rales. He exhibits no tenderness.  Musculoskeletal: Normal range of motion. He exhibits tenderness (bilateral lower back ). He exhibits no edema or deformity.  Neurological: He is alert and oriented to person, place, and time.  Skin: Skin is warm and dry. No rash noted. He is not diaphoretic. No erythema. No pallor.  Psychiatric: He has a normal mood and affect. His behavior is normal. Judgment and thought content normal.  Nursing note and vitals reviewed.     Assessment & Plan:  1. Cough  - DG Chest 2 View; Future - benzonatate (TESSALON) 200 MG capsule; Take 1 capsule (200 mg total) by mouth 2 (two) times daily as needed for cough.  Dispense: 20 capsule; Refill: 0  2. Acute bilateral low back pain without sciatica - Appears to be muscle strain from coughing. Will prescribe flexeril to take as needed. Can also use Tylenol/Motrin and heating pad - DG Lumbar Spine Complete; Future - cyclobenzaprine (FLEXERIL) 10 MG tablet; Take 1 tablet (10 mg total) by mouth 3 (three) times daily as needed for muscle spasms.  Dispense: 30 tablet; Refill: 0 - follow up if no improvement  Dorothyann Peng, NP

## 2016-12-11 NOTE — Telephone Encounter (Signed)
Updated patient on results of x rays.   He is going to try muscle relaxer, motrin and heating pad. He will follow up with primary care provider if needed

## 2016-12-25 ENCOUNTER — Encounter: Payer: Self-pay | Admitting: Gastroenterology

## 2017-01-05 ENCOUNTER — Encounter: Payer: Self-pay | Admitting: Gastroenterology

## 2017-01-21 IMAGING — DX DG CHEST 2V
2 series · 2 of 2 positions shown · non-contrast
Comparison: Radiograph January 07, 2016.

CLINICAL DATA: Productive cough, dyspnea.

EXAM:
CHEST  2 VIEW

[chest pa]
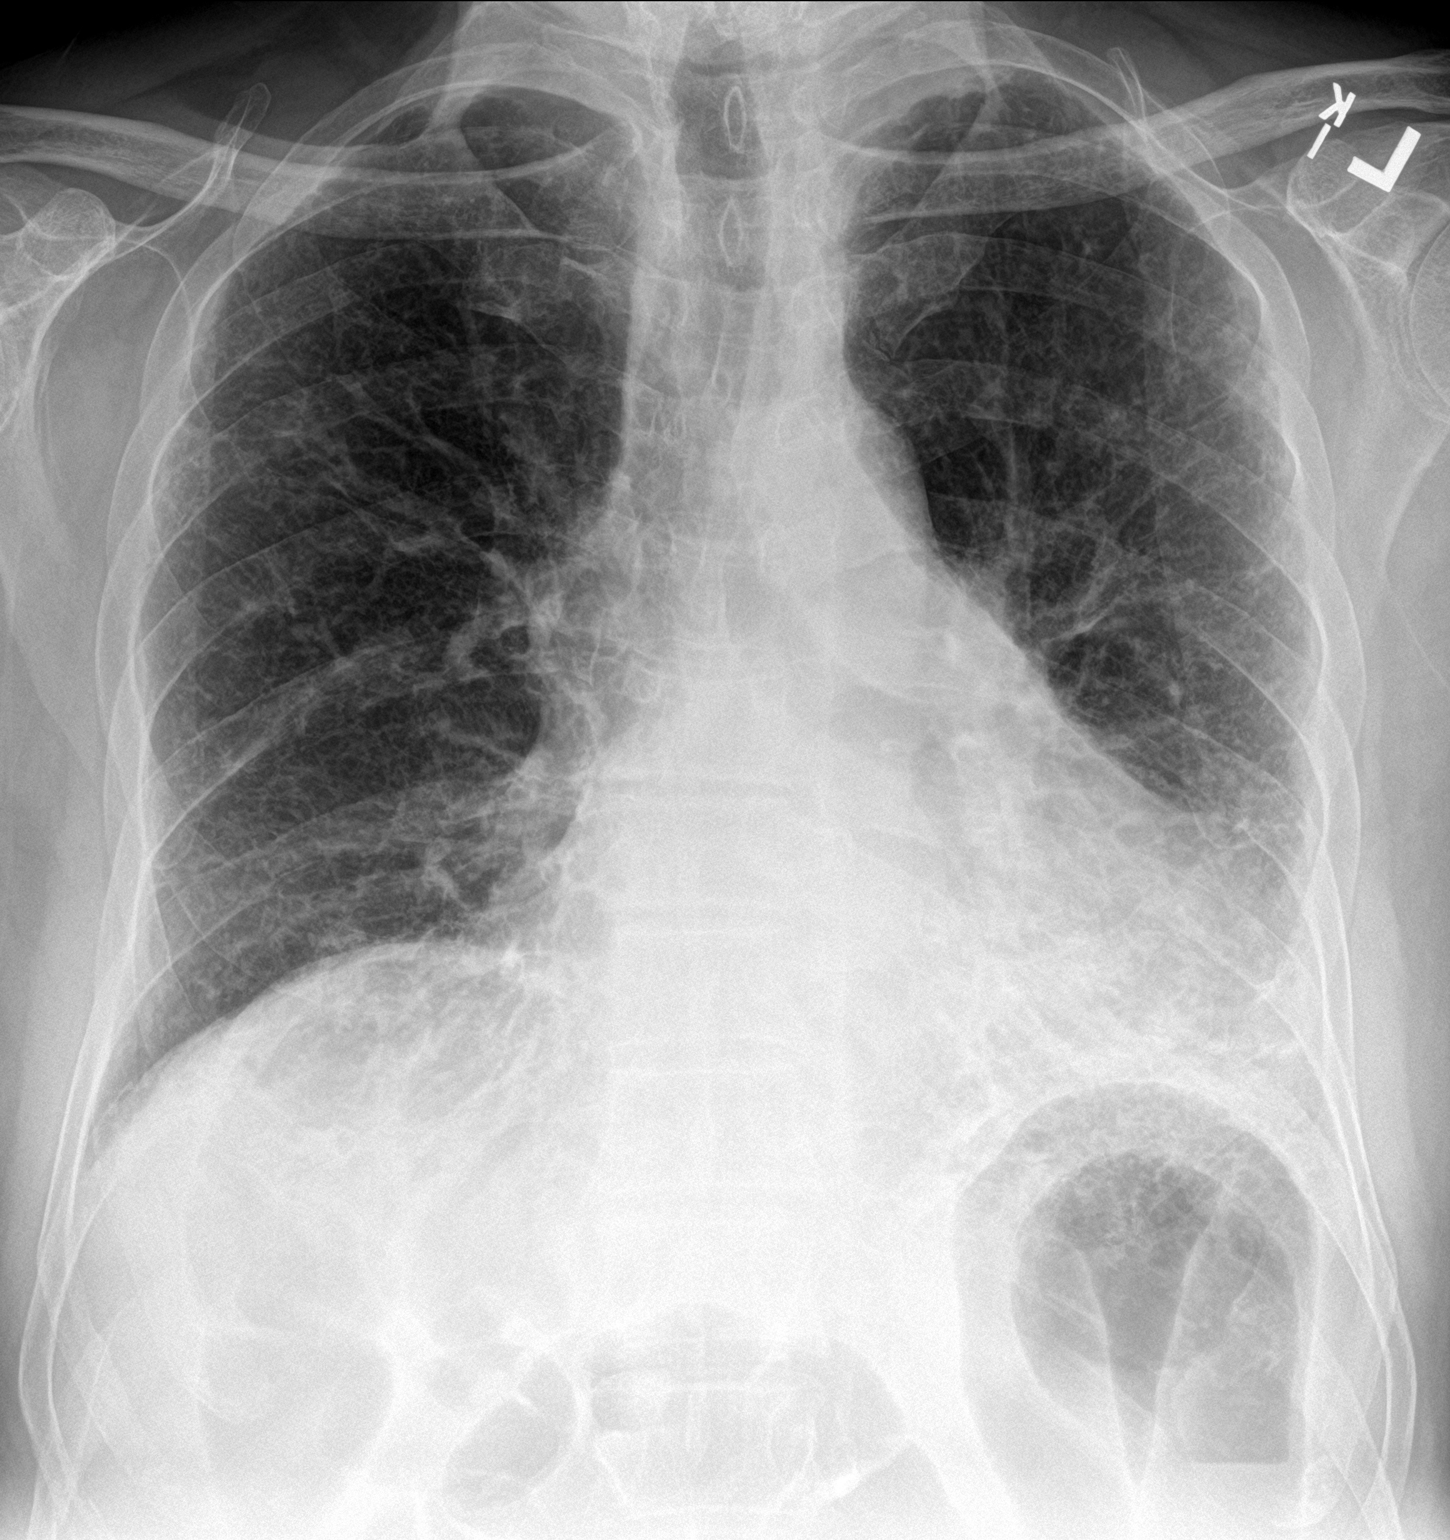

[chest lat]
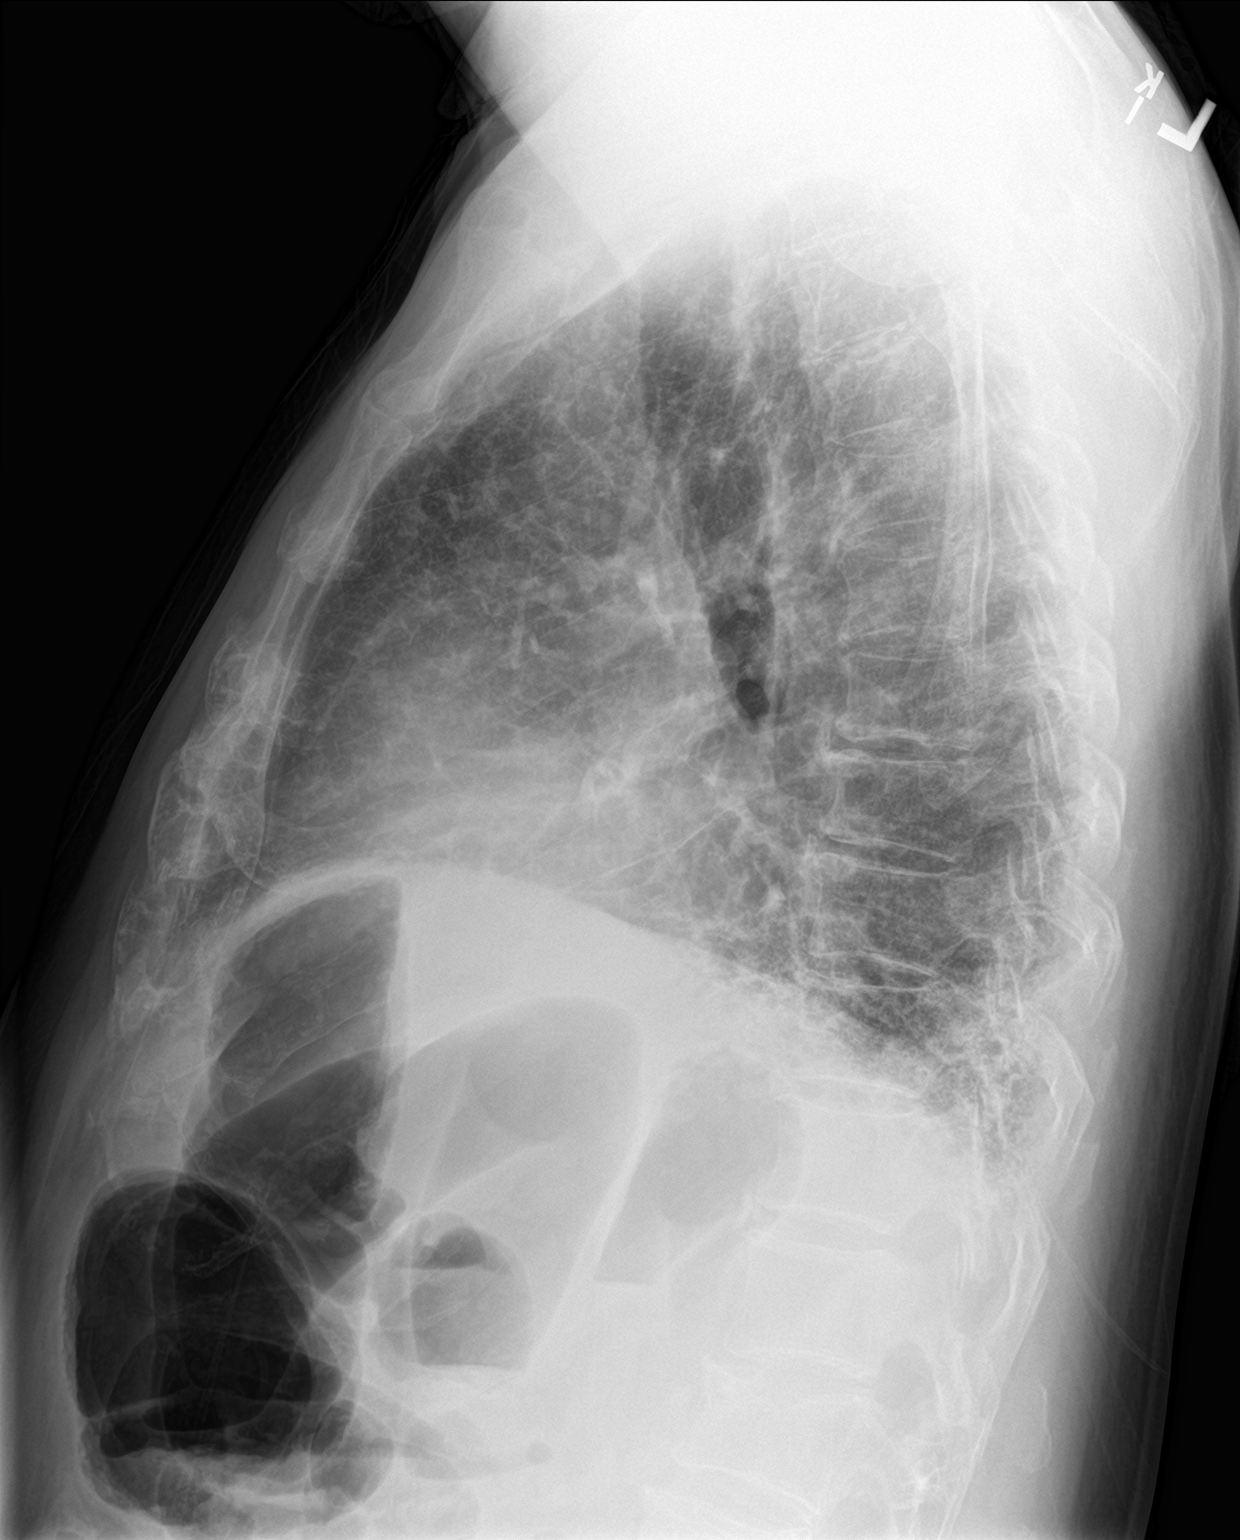

[2 of 2 positions shown; findings below may reference images not displayed]

FINDINGS: Stable cardiomediastinal silhouette. No pneumothorax or significant
pleural effusion is noted. Stable fibrotic changes are noted
throughout both lungs consistent with pulmonary fibrosis. Bony
thorax is unremarkable.
IMPRESSION: Stable findings consistent with pulmonary fibrosis. No significant
change compared to prior exam.

## 2017-01-21 IMAGING — DX DG LUMBAR SPINE COMPLETE 4+V
5 series · 5 of 5 positions shown · non-contrast
Comparison: CT scan of June 10, 2014.

CLINICAL DATA: Chronic low back pain without known injury.

EXAM:
LUMBAR SPINE - COMPLETE 4+ VIEW

[l-spine ap]
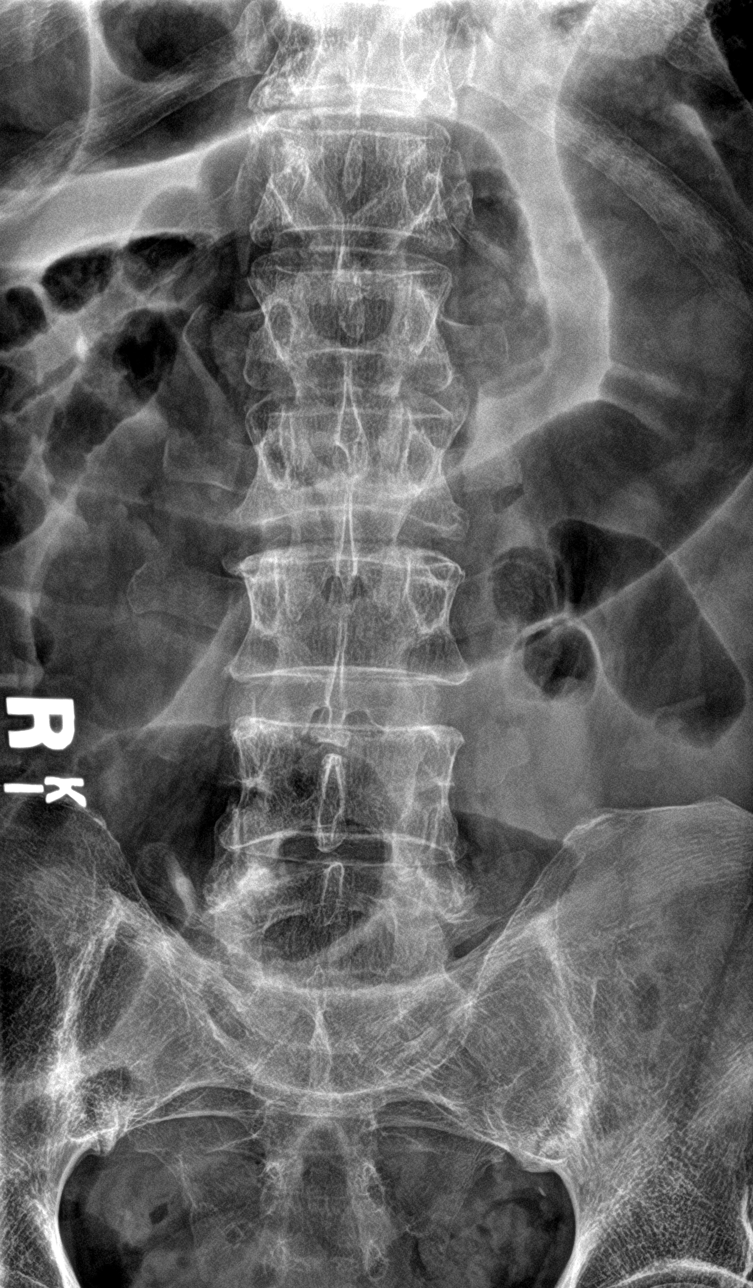

[l-spine obl (1 of 2)]
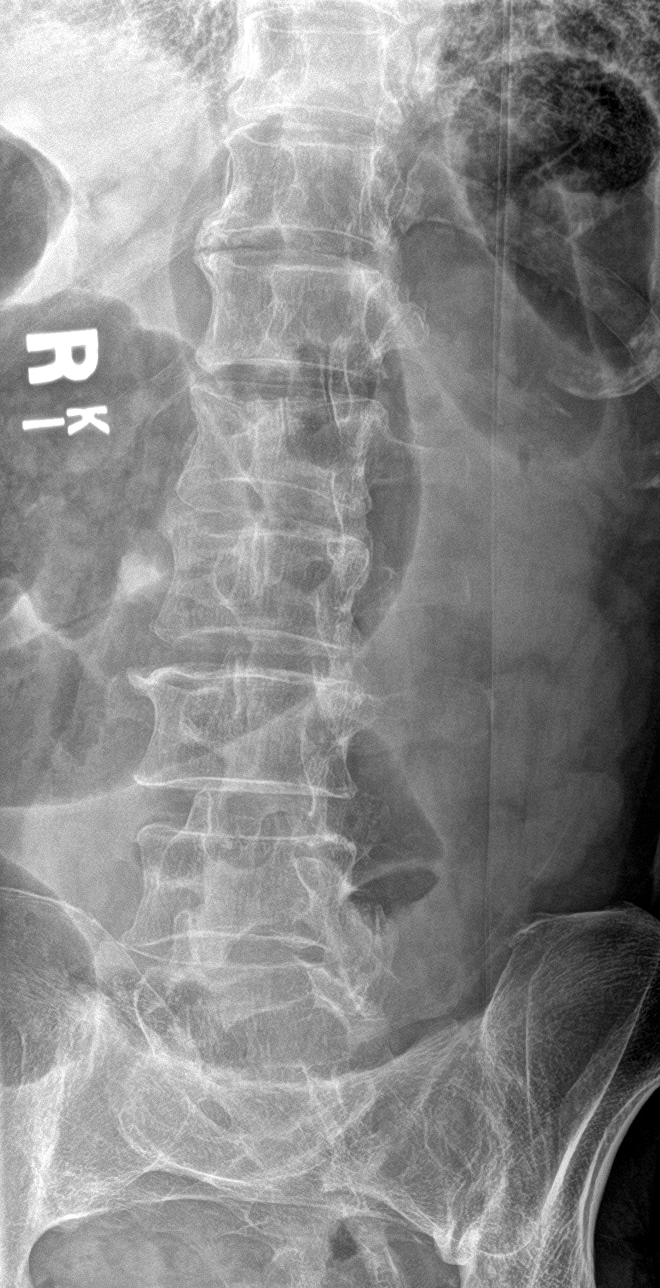

[l-spine obl (2 of 2)]
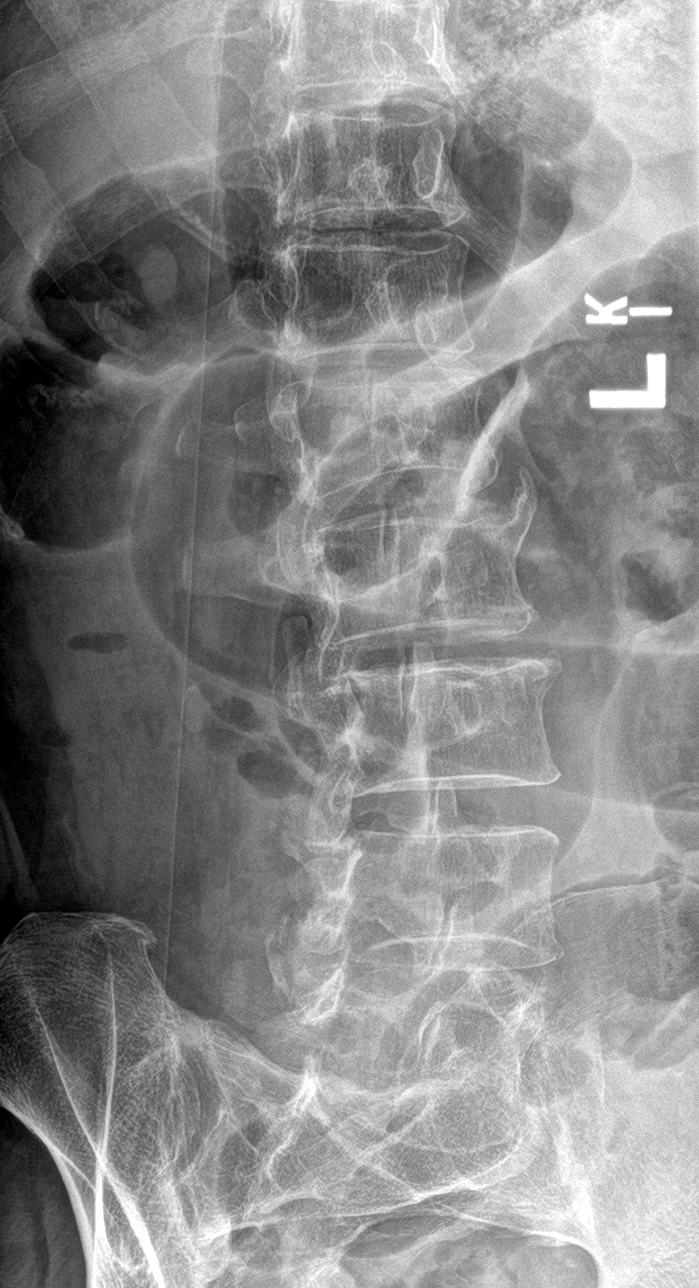

[l-spine lat]
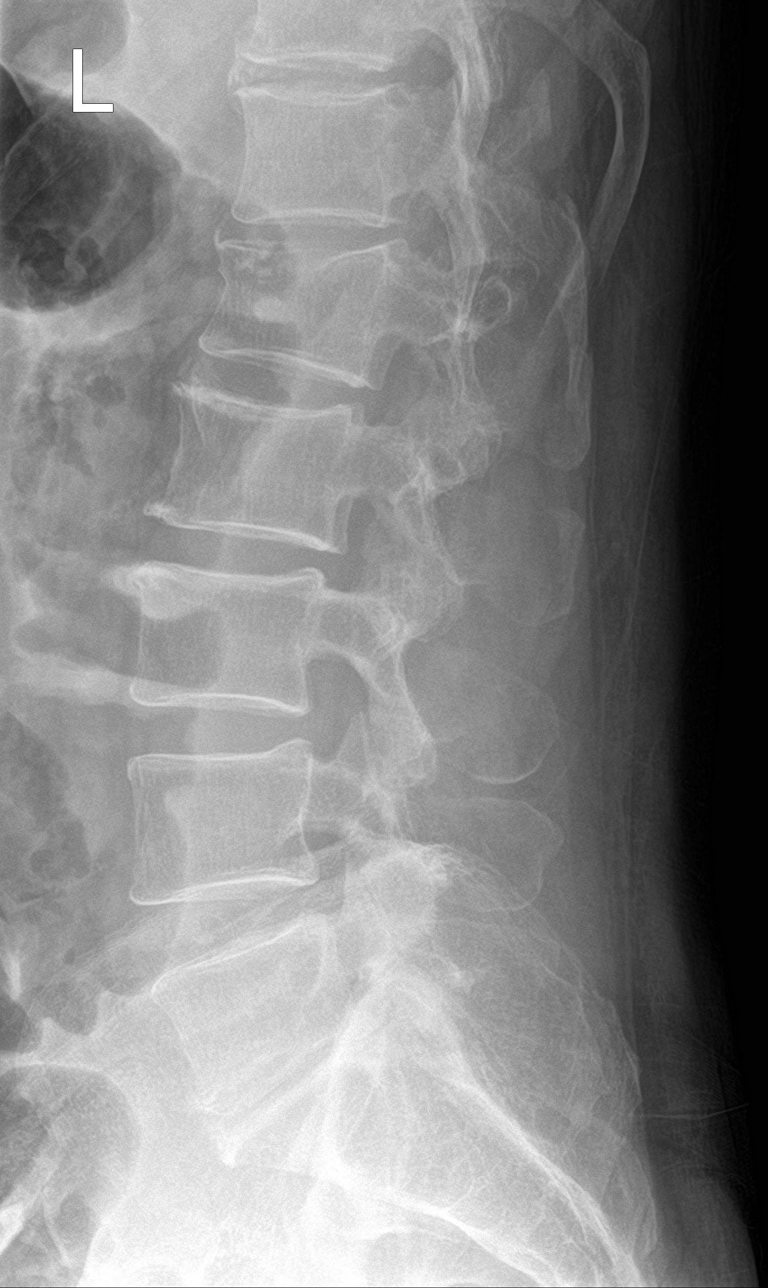

[l-spine spot]
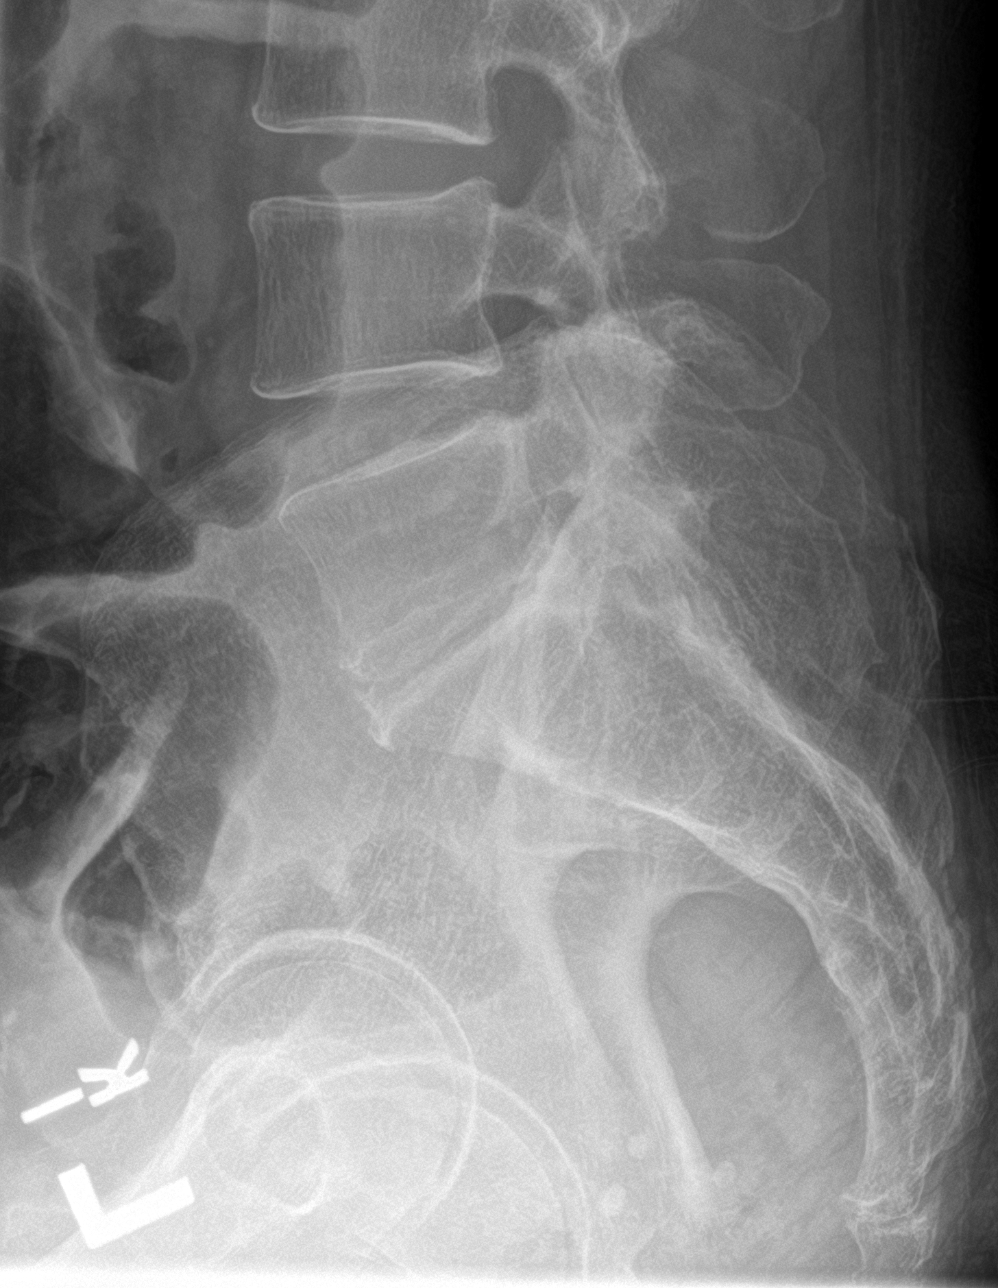

[5 of 5 positions shown; findings below may reference images not displayed]

FINDINGS: Interval development of mild compression deformity involving
superior endplate of L1 vertebral body concerning for fracture of
indeterminate age. Severe degenerative disc disease is noted at
L5-S1. Anterior osteophyte formation is noted at L2-3.
IMPRESSION: Severe degenerative disc disease is noted at L5-S1. Mild compression
deformity involving superior endplate of L1 vertebral body
concerning for fracture of indeterminate age. MRI may be performed
for further evaluation.

## 2017-02-02 ENCOUNTER — Telehealth: Payer: Self-pay | Admitting: Internal Medicine

## 2017-02-02 NOTE — Telephone Encounter (Signed)
Spoke with pt, requesting a refill on his tussionex.  States he wishes to pick this rx up from the office when ready. Last refill: 12/10/2016 #120m with 0 refills Last ov: 11/02/2016  MR ok to refill tussionex?  Thanks!

## 2017-02-03 MED ORDER — HYDROCOD POLST-CPM POLST ER 10-8 MG/5ML PO SUER: 5.0000 mL | Freq: Two times a day (BID) | ORAL | 0 refills | Status: DC | PRN

## 2017-02-03 NOTE — Telephone Encounter (Signed)
rx has been printed out and placed where BQ may sign this rx in the place of MR.  Will need to call the pt once this is done so pt may come and pick this up. Will route to ashley to follow up on rx.

## 2017-02-03 NOTE — Telephone Encounter (Signed)
OK by me 

## 2017-02-03 NOTE — Telephone Encounter (Signed)
Rx has been returned to triage. Pt is aware that this is ready for pick up. This will be placed up front for pick up. Nothing further was needed.

## 2017-02-03 NOTE — Telephone Encounter (Signed)
BQ  MR agreed to refill this pt chlorpheniramine-HYDROcodone (TUSSIONEX PENNKINETIC ER) 10-8 MG/5ML . In MR absence do you mind signing this rx

## 2017-02-03 NOTE — Telephone Encounter (Signed)
Yes that is fine  Thanks  Dr. Brand Males, M.D., [facility].C.P Pulmonary and Critical Care Medicine Staff Physician [facility] Pager: [phone], If no answer or between  15:00h - 7:00h: call [phone]  02/03/2017 10:25 AM

## 2017-02-08 ENCOUNTER — Telehealth: Payer: Self-pay | Admitting: *Deleted

## 2017-02-08 NOTE — Telephone Encounter (Signed)
I called patient. He states he is no longer on the study medication. Denies being on any blood thinner and no oxygen use at home. I"m sorry Dr.Danis to have sent you this before. He seems okay for LEC.Marland KitchenMarland Kitchen

## 2017-02-08 NOTE — Telephone Encounter (Signed)
After reviewing chart looks like patient is currently on research medication for Idiopathic pulmonary fibrosis. Is ok for recall colon at [facility]? Please advise. Thank you, Robbin pv.

## 2017-02-09 NOTE — Telephone Encounter (Signed)
Yes, sounds OK for LEC

## 2017-02-11 ENCOUNTER — Other Ambulatory Visit (INDEPENDENT_AMBULATORY_CARE_PROVIDER_SITE_OTHER): Payer: PPO

## 2017-02-11 DIAGNOSIS — Z0000 Encounter for general adult medical examination without abnormal findings: Secondary | ICD-10-CM | POA: Diagnosis not present

## 2017-02-11 LAB — TSH: TSH: 3.42 u[IU]/mL (ref 0.35–4.50)

## 2017-02-11 LAB — BASIC METABOLIC PANEL
BUN: 12 mg/dL (ref 6–23)
CO2: 34 mEq/L — ABNORMAL HIGH (ref 19–32)
Calcium: 9.4 mg/dL (ref 8.4–10.5)
Chloride: 99 mEq/L (ref 96–112)
Creatinine, Ser: 0.97 mg/dL (ref 0.40–1.50)
GFR: 81.39 mL/min (ref >60.00)
Glucose, Bld: 95 mg/dL (ref 70–99)
Potassium: 4.9 mEq/L (ref 3.5–5.1)
Sodium: 140 mEq/L (ref 135–145)

## 2017-02-11 LAB — HEPATIC FUNCTION PANEL
ALT: 8 U/L (ref 0–53)
AST: 16 U/L (ref 0–37)
Albumin: 4 g/dL (ref 3.5–5.2)
Alkaline Phosphatase: 51 U/L (ref 39–117)
Bilirubin, Direct: 0.1 mg/dL (ref 0.0–0.3)
Total Bilirubin: 0.4 mg/dL (ref 0.2–1.2)
Total Protein: 6.8 g/dL (ref 6.0–8.3)

## 2017-02-11 LAB — POC URINALSYSI DIPSTICK (AUTOMATED)
Bilirubin, UA: NEGATIVE
Blood, UA: NEGATIVE
Glucose, UA: NEGATIVE
Ketones, UA: NEGATIVE
Leukocytes, UA: NEGATIVE
Nitrite, UA: NEGATIVE
Protein, UA: NEGATIVE
Spec Grav, UA: 1.025
Urobilinogen, UA: 0.2
pH, UA: 6

## 2017-02-11 LAB — CBC WITH DIFFERENTIAL/PLATELET
Basophils Absolute: 0.1 10*3/uL (ref 0.0–0.1)
Basophils Relative: 0.8 % (ref 0.0–3.0)
Eosinophils Absolute: 0.5 10*3/uL (ref 0.0–0.7)
Eosinophils Relative: 5.5 % — ABNORMAL HIGH (ref 0.0–5.0)
HCT: 42.6 % (ref 39.0–52.0)
Hemoglobin: 14.1 g/dL (ref 13.0–17.0)
Lymphocytes Relative: 32.9 % (ref 12.0–46.0)
Lymphs Abs: 2.9 10*3/uL (ref 0.7–4.0)
MCHC: 33.1 g/dL (ref 30.0–36.0)
MCV: 95.8 fl (ref 78.0–100.0)
Monocytes Absolute: 0.9 10*3/uL (ref 0.1–1.0)
Monocytes Relative: 10.6 % (ref 3.0–12.0)
Neutro Abs: 4.5 10*3/uL (ref 1.4–7.7)
Neutrophils Relative %: 50.2 % (ref 43.0–77.0)
Platelets: 348 10*3/uL (ref 150.0–400.0)
RBC: 4.44 Mil/uL (ref 4.22–5.81)
RDW: 14.5 % (ref 11.5–15.5)
WBC: 8.9 10*3/uL (ref 4.0–10.5)

## 2017-02-11 LAB — LIPID PANEL
Cholesterol: 192 mg/dL (ref 0–200)
HDL: 59.3 mg/dL (ref >39.00)
LDL Cholesterol: 117 mg/dL — ABNORMAL HIGH (ref 0–99)
NonHDL: 133.11
Total CHOL/HDL Ratio: 3
Triglycerides: 79 mg/dL (ref 0.0–149.0)
VLDL: 15.8 mg/dL (ref 0.0–40.0)

## 2017-02-11 LAB — PSA: PSA: 0.95 ng/mL (ref 0.10–4.00)

## 2017-02-15 ENCOUNTER — Ambulatory Visit (AMBULATORY_SURGERY_CENTER): Payer: Self-pay | Admitting: *Deleted

## 2017-02-15 VITALS — Ht 68.0 in | Wt 172.0 lb

## 2017-02-15 DIAGNOSIS — Z86010 Personal history of colonic polyps: Secondary | ICD-10-CM

## 2017-02-15 MED ORDER — NA SULFATE-K SULFATE-MG SULF 17.5-3.13-1.6 GM/177ML PO SOLN: 1.0000 | Freq: Once | ORAL | 0 refills | Status: AC

## 2017-02-15 NOTE — Progress Notes (Signed)
No egg or soy allergy known to patient  No issues with past sedation with any surgeries  or procedures, no intubation problems  No diet pills per patient No home 02 use per patient  No blood thinners per patient  Pt denies issues with constipation  No A fib or A flutter - ablation for a flutter 2016 with no reoccurance

## 2017-02-16 ENCOUNTER — Ambulatory Visit: Payer: Commercial Managed Care - HMO

## 2017-02-16 ENCOUNTER — Encounter: Payer: Self-pay | Admitting: Family Medicine

## 2017-02-16 ENCOUNTER — Ambulatory Visit (INDEPENDENT_AMBULATORY_CARE_PROVIDER_SITE_OTHER): Payer: PPO | Admitting: Family Medicine

## 2017-02-16 ENCOUNTER — Encounter: Payer: Self-pay | Admitting: Gastroenterology

## 2017-02-16 VITALS — BP 131/77 | HR 98 | Temp 97.9°F | Ht 68.0 in | Wt 168.0 lb

## 2017-02-16 DIAGNOSIS — Z23 Encounter for immunization: Secondary | ICD-10-CM | POA: Diagnosis not present

## 2017-02-16 DIAGNOSIS — Z0000 Encounter for general adult medical examination without abnormal findings: Secondary | ICD-10-CM | POA: Diagnosis not present

## 2017-02-16 MED ORDER — ZOLPIDEM TARTRATE 5 MG PO TABS: 5.0000 mg | ORAL_TABLET | Freq: Every evening | ORAL | 1 refills | Status: DC | PRN

## 2017-02-16 MED ORDER — DICLOFENAC SODIUM 75 MG PO TBEC: 75.0000 mg | DELAYED_RELEASE_TABLET | Freq: Two times a day (BID) | ORAL | 3 refills | Status: DC

## 2017-02-16 MED ORDER — PANTOPRAZOLE SODIUM 40 MG PO TBEC: 40.0000 mg | DELAYED_RELEASE_TABLET | Freq: Two times a day (BID) | ORAL | 3 refills | Status: DC

## 2017-02-16 NOTE — Progress Notes (Signed)
   Subjective:    Patient ID: Tanner Bishop, male    DOB: 1946-02-12, 71 y.o.   MRN: [id number]  HPI 71 yr old male for a well exam. His only complaint is low back pain tat started about 4 months ago. He describes a dull aching pain that is worse if he sits in his car for long periods. No pain or numbness or weakness in the legs. He had plain film of the LS spine in December showing an old compression fracture of L1 and severe degenerative disc disease at L5-S1.    Review of Systems  Constitutional: Negative.   HENT: Negative.   Eyes: Negative.   Respiratory: Negative.   Cardiovascular: Negative.   Gastrointestinal: Negative.   Genitourinary: Negative.   Musculoskeletal: Positive for back pain. Negative for arthralgias, gait problem, joint swelling, myalgias, neck pain and neck stiffness.  Skin: Negative.   Neurological: Negative.   Psychiatric/Behavioral: Negative.        Objective:   Physical Exam  Constitutional: He is oriented to person, place, and time. He appears well-developed and well-nourished. No distress.  HENT:  Head: Normocephalic and atraumatic.  Right Ear: External ear normal.  Left Ear: External ear normal.  Nose: Nose normal.  Mouth/Throat: Oropharynx is clear and moist. No oropharyngeal exudate.  Eyes: Conjunctivae and EOM are normal. Pupils are equal, round, and reactive to light. Right eye exhibits no discharge. Left eye exhibits no discharge. No scleral icterus.  Neck: Neck supple. No JVD present. No tracheal deviation present. No thyromegaly present.  Cardiovascular: Normal rate, regular rhythm, normal heart sounds and intact distal pulses.  Exam reveals no gallop and no friction rub.   No murmur heard. Pulmonary/Chest: Effort normal and breath sounds normal. No respiratory distress. He has no wheezes. He has no rales. He exhibits no tenderness.  Abdominal: Soft. Bowel sounds are normal. He exhibits no distension and no mass. There is no tenderness. There is  no rebound and no guarding.  Genitourinary: Rectum normal, prostate normal and penis normal. Rectal exam shows guaiac negative stool. No penile tenderness.  Musculoskeletal: Normal range of motion. He exhibits no edema.  He is tender over the lower spine with full ROM   Lymphadenopathy:    He has no cervical adenopathy.  Neurological: He is alert and oriented to person, place, and time. He has normal reflexes. No cranial nerve deficit. He exhibits normal muscle tone. Coordination normal.  Skin: Skin is warm and dry. No rash noted. He is not diaphoretic. No erythema. No pallor.  Psychiatric: He has a normal mood and affect. His behavior is normal. Judgment and thought content normal.          Assessment & Plan:  Well exam. We discussed diet and exercise. For the back pain he will try Diclofenac bid. He has a colonoscopy coming up in the next few weeks.  Alysia Penna, MD

## 2017-02-16 NOTE — Progress Notes (Addendum)
Subjective:   Tanner Bishop is a 71 y.o. male who presents for Medicare Annual/Subsequent preventive examination.  The Patient was informed that the wellness visit is to identify future health risk and educate and initiate measures that can reduce risk for increased disease through the lifespan.    Medicare Wellness Visit today Seeing Dr. Sarajane Jews today  Married x 18 years Has one young grand children (2)  3 children in the general area    Describes health as good, fair or great? Good except for back pain today IPF; 2014; started having a nagging cough; Xrays and CT and went to pulmonary  Continues on OFEV; slow the process down Functionally;  Does what he wants too Checks his oxygen sat often    Preventive Screening -Counseling & Management  Colonoscopy due 03/01/2017 he has picked up the prep PSA repeated   Smoking history/ 16 pack years  Completed AAA check; no aneurysm   Smokeless tobacco - no Second Hand Smoke status; No Smokers in the home no ETOH no  Medication adherence or issues? No  Afib aggravates him- heart is out of rhythm   RISK FACTORS Diet Has cereal bar or light lunch Eats out at lunch; he and spouse Tanner Bishop Light supper with fruit or yogurt  Regular exercise walks 5 miles on the treadmill At home; takes him 1.5 hours   Cardiac Risk Factors:  Advanced aged > 25 in men;  Hyperlipidemia cho good;  Diabetes - 6.0 FBS excellent; great exercise routine FBS WNL Family History - father had and liver cancer Mother - Liver cancer   Fall risk: no falls  Given education on "Fall Prevention in the Home" for more safety tips the patient can apply as appropriate.  Long term goal is to "age in place" or undecided   Mobility of Functional changes this year? no Safety; one level home; plans to stay there until he can't take care of it anymore   Mental Health:  Any emotional problems? Anxious, depressed, irritable, sad or blue? no Denies  feeling depressed or hopeless; voices pleasure in daily life How many social activities have you been engaged in within the last 2 weeks? no    Hearing Screening   _0  _1  _2  _3  _4  _5  _6  _7  _8   Right ear:       100    Left ear:       100    Vision Screening Comments: Eyes are checked  Had cataracts removed last year Doesn't have to wear glasses [facility]    Activities of Daily Living - See functional screen   Cognitive testing; Ad8 score; 0 or less than 2  MMSE deferred or completed if AD8 + 2 issues  Advanced Directives  yes  Patient Care Team: [name], MD as PCP - General Brand Males, MD as Consulting Physician (Pulmonary Disease)    Required Immunizations needed today/ limited vaccinations Took His prevnar 13 today        Objective:    Vitals: BP 131/77   Pulse 98   Temp 97.9 F (36.6 C)   Ht _9  (1.727 m)   Wt 168 lb (76.2 kg)   BMI 25.54 kg/m   Body mass index is 25.54 kg/m.  Tobacco History  Smoking Status  . Former Smoker  . Packs/day: 1.00  . Years: 16.00  . Types: Cigarettes  . Quit date: 12/14/1978  Smokeless Tobacco  . Never Used  Counseling given: Yes   Past Medical History:  Diagnosis Date  . Allergy   . Arthritis   . Atrial flutter ([place])    a. s/p ablation 03/2015 Dr Lovena Le  . Cataract    removed both eyes  . GERD (gastroesophageal reflux disease)   . H/O hiatal hernia   . Hyperlipidemia   . Injury of right hand    permanent damage after workplace 2009 and 2010 Dr Vernona Rieger [facility]  . Pulmonary fibrosis ([place])    sees Dr. Onnie Graham   . Renal stone 06/2014  . Ulcer ([place])    unresolved   Past Surgical History:  Procedure Laterality Date  . ATRIAL FLUTTER ABLATION N/A 04/08/2015   Procedure: ATRIAL FLUTTER ABLATION;  Surgeon: Evans Lance, MD;  Location: [facility] CATH LAB;  Service: Cardiovascular;  Laterality: N/A;  . CATARACT EXTRACTION, BILATERAL  2016  .  COLONOSCOPY  02-24-12   adenomatous polyps removed, repeat in 5 yrs, per Dr. Deatra Ina   . ESOPHAGOGASTRODUODENOSCOPY  2007  . EXTRACORPOREAL SHOCK WAVE LITHOTRIPSY  06-17-14   per Dr. Jeffie Pollock   . HAND SURGERY Right   . LUNG BIOPSY Left 03/22/2013   Procedure: LUNG BIOPSY;  Surgeon: Melrose Nakayama, MD;  Location: [place];  Service: Thoracic;  Laterality: Left;  . POLYPECTOMY    . UPPER GASTROINTESTINAL ENDOSCOPY    . VIDEO ASSISTED THORACOSCOPY Left 03/22/2013   Procedure: VIDEO ASSISTED THORACOSCOPY;  Surgeon: Melrose Nakayama, MD;  Location: [facility] OR;  Service: Thoracic;  Laterality: Left;   Family History  Problem Relation Age of Onset  . Liver cancer Mother   . Diabetes Sister   . Lung cancer Sister   . Heart disease Brother   . Diabetes Brother   . Colon cancer Brother   . Diabetes    . Cancer      prostate  . Colon polyps Neg Hx   . Esophageal cancer Neg Hx   . Rectal cancer Neg Hx   . Stomach cancer Neg Hx    History  Sexual Activity  . Sexual activity: Not on file    Outpatient Encounter Prescriptions as of 02/16/2017  Medication Sig  . diphenhydrAMINE (BENADRYL) 25 MG tablet Take 25 mg by mouth every 6 (six) hours as needed for allergies or sleep.  . fluticasone (FLONASE) 50 MCG/ACT nasal spray Place 2 sprays into both nostrils daily.  Marland Kitchen GARLIC OIL PO Take 1 capsule by mouth daily.  . Nintedanib (OFEV) 150 MG CAPS Take 1 tablet by mouth 2 (two) times daily.  . pantoprazole (PROTONIX) 40 MG tablet Take 1 tablet (40 mg total) by mouth 2 (two) times daily.  . ranitidine (ZANTAC) 300 MG tablet TAKE 1 TABLET BY MOUTH AT BEDTIME  . zolpidem (AMBIEN) 5 MG tablet Take 1 tablet (5 mg total) by mouth at bedtime as needed for sleep.  . [DISCONTINUED] pantoprazole (PROTONIX) 40 MG tablet Take 1 tablet (40 mg total) by mouth 2 (two) times daily.  . [DISCONTINUED] zolpidem (AMBIEN) 5 MG tablet Take 1 tablet (5 mg total) by mouth at bedtime as needed for sleep.  .  chlorpheniramine-HYDROcodone (TUSSIONEX PENNKINETIC ER) 10-8 MG/5ML SUER Take 5 mLs by mouth every 12 (twelve) hours as needed for cough. (Patient not taking: Reported on 02/16/2017)  . diclofenac (VOLTAREN) 75 MG EC tablet Take 1 tablet (75 mg total) by mouth 2 (two) times daily.  . [DISCONTINUED] benzonatate (TESSALON) 200 MG capsule Take 1 capsule (200 mg total) by mouth 2 (  two) times daily as needed for cough. (Patient not taking: Reported on 02/15/2017)  . [DISCONTINUED] cyclobenzaprine (FLEXERIL) 10 MG tablet Take 1 tablet (10 mg total) by mouth 3 (three) times daily as needed for muscle spasms.   No facility-administered encounter medications on file as of 02/16/2017.     Activities of Daily Living In your present state of health, do you have any difficulty performing the following activities: 02/16/2017 02/18/2016  Hearing? N N  Vision? N N  Difficulty concentrating or making decisions? N N  Walking or climbing stairs? N N  Dressing or bathing? N N  Doing errands, shopping? N -  Preparing Food and eating ? N -  Using the Toilet? N -  In the past six months, have you accidently leaked urine? N -  Do you have problems with loss of bowel control? N -  Managing your Medications? N -  Managing your Finances? N -  Housekeeping or managing your Housekeeping? N -  Some recent data might be hidden    Patient Care Team: [name], MD as PCP - General Brand Males, MD as Consulting Physician (Pulmonary Disease)   Assessment:     Exercise Activities and Dietary recommendations    Goals    . patient          Looks forward to family vacation       Fall Risk Fall Risk  02/16/2017 02/16/2017 02/27/2015  Falls in the past year? No No No   Depression Screen PHQ 2/9 Scores 02/16/2017 02/16/2017 02/27/2015  PHQ - 2 Score 0 0 0    Ad8 score reviewed for issues:  Issues making decisions:  Less interest in hobbies / activities:  Repeats questions, stories (family  complaining):  Trouble using ordinary gadgets (microwave, computer, phone):  Forgets the month or year:   Mismanaging finances:   Remembering appts:  Daily problems with thinking and/or memory: Ad8 score is= 0        Immunization History  Administered Date(s) Administered  . Influenza Split 09/13/2013  . Influenza, High Dose Seasonal PF 10/06/2016  . Influenza-Unspecified 09/07/2014, 09/14/2015  . Pneumococcal Conjugate-13 02/16/2017  . Pneumococcal Polysaccharide-23 05/26/2013  . Td 12/15/2007  . Zoster 02/19/2014   Screening Tests Health Maintenance  Topic Date Due  . Hepatitis C Screening  September 08, 1946  . COLONOSCOPY  02/23/2017  . TETANUS/TDAP  12/14/2017  . INFLUENZA VACCINE  Completed  . PNA vac Low Risk Adult  Completed      Plan:     No issues at present Will have hep c drawn at next blood draw Planning for colonoscopy later this month  No abnormal screens   During the course of the visit the patient was educated and counseled about the following appropriate screening and preventive services:   Vaccines to include Pneumoccal, Influenza, Hepatitis B, Td, Zostavax, HCV  Electrocardiogram  Cardiovascular Disease  Colorectal cancer screening  Diabetes screening - prediabetes but fbs wnl  Prostate Cancer Screening  Glaucoma screening  Nutrition counseling   Smoking cessation counseling  Patient Instructions (the written plan) was given to the patient.    IRCVE,LFYBO, RN  02/16/2017  I have reviewed this note and agree with its contents. Alysia Penna, MD

## 2017-02-16 NOTE — Patient Instructions (Addendum)
WE NOW OFFER   [facility] FAST TRACK!!!  SAME DAY Appointments for ACUTE CARE  Such as: Sprains, Injuries, cuts, abrasions, rashes, muscle pain, joint pain, back pain Colds, flu, sore throats, headache, allergies, cough, fever  Ear pain, sinus and eye infections Abdominal pain, nausea, vomiting, diarrhea, upset stomach Animal/insect bites  3 Easy Ways to Schedule: Walk-In Scheduling Call in scheduling Mychart Sign-up: [email or web address]    Tanner Bishop , Thank you for taking time to come for your Medicare Wellness Visit. I appreciate your ongoing commitment to your health goals. Please review the following plan we discussed and let me know if I can assist you in the future.   A Tetanus is recommended every 10 years. Medicare covers a tetanus if you have a cut or wound; otherwise, there may be a charge. If you had not had a tetanus with pertusses, known as the Tdap, you can take this anytime.    These are the goals we discussed: Goals    . patient          Looks forward to family vacation        This is a list of the screening recommended for you and due dates:  Health Maintenance  Topic Date Due  .  Hepatitis C: One time screening is recommended by Center for Disease Control  (CDC) for  adults born from [birth year] through 1964.   07/12/1946  . Colon Cancer Screening  02/23/2017  . Tetanus Vaccine  12/14/2017  . Flu Shot  Completed  . Pneumonia vaccines  Completed    Health Maintenance, Male A healthy lifestyle and preventive care is important for your health and wellness. Ask your health care provider about what schedule of regular examinations is right for you. What should I know about weight and diet?  Eat a Healthy Diet  Eat plenty of vegetables, fruits, whole grains, low-fat dairy products, and lean protein.  Do not eat a lot of foods high in solid fats, added sugars, or salt. Maintain a Healthy Weight  Regular exercise can help you achieve  or maintain a healthy weight. You should:  Do at least 150 minutes of exercise each week. The exercise should increase your heart rate and make you sweat (moderate-intensity exercise).  Do strength-training exercises at least twice a week. Watch Your Levels of Cholesterol and Blood Lipids  Have your blood tested for lipids and cholesterol every 5 years starting at 70 years of age. If you are at high risk for heart disease, you should start having your blood tested when you are 70 years old. You may need to have your cholesterol levels checked more often if:  Your lipid or cholesterol levels are high.  You are older than 70 years of age.  You are at high risk for heart disease. What should I know about cancer screening? Many types of cancers can be detected early and may often be prevented. Lung Cancer  You should be screened every year for lung cancer if:  You are a current smoker who has smoked for at least 30 years.  You are a former smoker who has quit within the past 15 years.  Talk to your health care provider about your screening options, when you should start screening, and how often you should be screened. Colorectal Cancer  Routine colorectal cancer screening usually begins at 70 years of age and should be repeated every 5-10 years until you are 70 years old. You may need to be screened more often  if early forms of precancerous polyps or small growths are found. Your health care provider may recommend screening at an earlier age if you have risk factors for colon cancer.  Your health care provider may recommend using home test kits to check for hidden blood in the stool.  A small camera at the end of a tube can be used to examine your colon (sigmoidoscopy or colonoscopy). This checks for the earliest forms of colorectal cancer. Prostate and Testicular Cancer  Depending on your age and overall health, your health care provider may do certain tests to screen for prostate and  testicular cancer.  Talk to your health care provider about any symptoms or concerns you have about testicular or prostate cancer. Skin Cancer  Check your skin from head to toe regularly.  Tell your health care provider about any new moles or changes in moles, especially if:  There is a change in a mole's size, shape, or color.  You have a mole that is larger than a pencil eraser.  Always use sunscreen. Apply sunscreen liberally and repeat throughout the day.  Protect yourself by wearing long sleeves, pants, a wide-brimmed hat, and sunglasses when outside. What should I know about heart disease, diabetes, and high blood pressure?  If you are 7-65 years of age, have your blood pressure checked every 3-5 years. If you are 25 years of age or older, have your blood pressure checked every year. You should have your blood pressure measured twice-once when you are at a hospital or clinic, and once when you are not at a hospital or clinic. Record the average of the two measurements. To check your blood pressure when you are not at a hospital or clinic, you can use:  An automated blood pressure machine at a pharmacy.  A home blood pressure monitor.  Talk to your health care provider about your target blood pressure.  If you are between 45-79 years old, ask your health care provider if you should take aspirin to prevent heart disease.  Have regular diabetes screenings by checking your fasting blood sugar level.  If you are at a normal weight and have a low risk for diabetes, have this test once every three years after the age of 57.  If you are overweight and have a high risk for diabetes, consider being tested at a younger age or more often.  A one-time screening for abdominal aortic aneurysm (AAA) by ultrasound is recommended for men aged 77-75 years who are current or former smokers. What should I know about preventing infection? Hepatitis B  If you have a higher risk for hepatitis B,  you should be screened for this virus. Talk with your health care provider to find out if you are at risk for hepatitis B infection. Hepatitis C  Blood testing is recommended for:  Everyone born from [birth year] through 1964.  Anyone with known risk factors for hepatitis C. Sexually Transmitted Diseases (STDs)  You should be screened each year for STDs including gonorrhea and chlamydia if:  You are sexually active and are younger than 70 years of age.  You are older than 70 years of age and your health care provider tells you that you are at risk for this type of infection.  Your sexual activity has changed since you were last screened and you are at an increased risk for chlamydia or gonorrhea. Ask your health care provider if you are at risk.  Talk with your health care provider about whether you  are at high risk of being infected with HIV. Your health care provider may recommend a prescription medicine to help prevent HIV infection. What else can I do?  Schedule regular health, dental, and eye exams.  Stay current with your vaccines (immunizations).  Do not use any tobacco products, such as cigarettes, chewing tobacco, and e-cigarettes. If you need help quitting, ask your health care provider.  Limit alcohol intake to no more than 2 drinks per day. One drink equals 12 ounces of beer, 5 ounces of wine, or 1 ounces of hard liquor.  Do not use street drugs.  Do not share needles.  Ask your health care provider for help if you need support or information about quitting drugs.  Tell your health care provider if you often feel depressed.  Tell your health care provider if you have ever been abused or do not feel safe at home. This information is not intended to replace advice given to you by your health care provider. Make sure you discuss any questions you have with your health care provider. Document Released: 05/28/2008 Document Revised: 07/29/2016 Document Reviewed:  09/03/2015 Elsevier Interactive Patient Education  2017 [place] Prevention in the Home Falls can cause injuries and can affect people from all age groups. There are many simple things that you can do to make your home safe and to help prevent falls. What can I do on the outside of my home?  Regularly repair the edges of walkways and driveways and fix any cracks.  Remove high doorway thresholds.  Trim any shrubbery on the main path into your home.  Use bright outdoor lighting.  Clear walkways of debris and clutter, including tools and rocks.  Regularly check that handrails are securely fastened and in good repair. Both sides of any steps should have handrails.  Install guardrails along the edges of any raised decks or porches.  Have leaves, snow, and ice cleared regularly.  Use sand or salt on walkways during winter months.  In the garage, clean up any spills right away, including grease or oil spills. What can I do in the bathroom?  Use night lights.  Install grab bars by the toilet and in the tub and shower. Do not use towel bars as grab bars.  Use non-skid mats or decals on the floor of the tub or shower.  If you need to sit down while you are in the shower, use a plastic, non-slip stool.  Keep the floor dry. Immediately clean up any water that spills on the floor.  Remove soap buildup in the tub or shower on a regular basis.  Attach bath mats securely with double-sided non-slip rug tape.  Remove throw rugs and other tripping hazards from the floor. What can I do in the bedroom?  Use night lights.  Make sure that a bedside light is easy to reach.  Do not use oversized bedding that drapes onto the floor.  Have a firm chair that has side arms to use for getting dressed.  Remove throw rugs and other tripping hazards from the floor. What can I do in the kitchen?  Clean up any spills right away.  Avoid walking on wet floors.  Place frequently  used items in easy-to-reach places.  If you need to reach for something above you, use a sturdy step stool that has a grab bar.  Keep electrical cables out of the way.  Do not use floor polish or wax that makes floors slippery.  If you have to use wax, make sure that it is non-skid floor wax.  Remove throw rugs and other tripping hazards from the floor. What can I do in the stairways?  Do not leave any items on the stairs.  Make sure that there are handrails on both sides of the stairs. Fix handrails that are broken or loose. Make sure that handrails are as long as the stairways.  Check any carpeting to make sure that it is firmly attached to the stairs. Fix any carpet that is loose or worn.  Avoid having throw rugs at the top or bottom of stairways, or secure the rugs with carpet tape to prevent them from moving.  Make sure that you have a light switch at the top of the stairs and the bottom of the stairs. If you do not have them, have them installed. What are some other fall prevention tips?  Wear closed-toe shoes that fit well and support your feet. Wear shoes that have rubber soles or low heels.  When you use a stepladder, make sure that it is completely opened and that the sides are firmly locked. Have someone hold the ladder while you are using it. Do not climb a closed stepladder.  Add color or contrast paint or tape to grab bars and handrails in your home. Place contrasting color strips on the first and last steps.  Use mobility aids as needed, such as canes, walkers, scooters, and crutches.  Turn on lights if it is dark. Replace any light bulbs that burn out.  Set up furniture so that there are clear paths. Keep the furniture in the same spot.  Fix any uneven floor surfaces.  Choose a carpet design that does not hide the edge of steps of a stairway.  Be aware of any and all pets.  Review your medicines with your healthcare provider. Some medicines can cause dizziness  or changes in blood pressure, which increase your risk of falling. Talk with your health care provider about other ways that you can decrease your risk of falls. This may include working with a physical therapist or trainer to improve your strength, balance, and endurance. This information is not intended to replace advice given to you by your health care provider. Make sure you discuss any questions you have with your health care provider. Document Released: 11/20/2002 Document Revised: 04/28/2016 Document Reviewed: 01/04/2015 Elsevier Interactive Patient Education  2017 [employer].

## 2017-02-16 NOTE — Progress Notes (Signed)
Pre visit review using our clinic review tool, if applicable. No additional management support is needed unless otherwise documented below in the visit note.

## 2017-02-24 ENCOUNTER — Telehealth: Payer: Self-pay

## 2017-02-24 NOTE — Telephone Encounter (Signed)
Received Pa request from insurance company on Zolpidem. PA submitted & is pending. Key: [id number]

## 2017-02-25 NOTE — Telephone Encounter (Signed)
PA approved, form faxed back to pharmacy. 

## 2017-03-01 ENCOUNTER — Ambulatory Visit (AMBULATORY_SURGERY_CENTER): Payer: PPO | Admitting: Gastroenterology

## 2017-03-01 ENCOUNTER — Encounter: Payer: Self-pay | Admitting: Gastroenterology

## 2017-03-01 VITALS — BP 120/70 | HR 56 | Temp 97.1°F | Resp 20 | Ht 68.0 in | Wt 172.0 lb

## 2017-03-01 DIAGNOSIS — I4892 Unspecified atrial flutter: Secondary | ICD-10-CM | POA: Diagnosis not present

## 2017-03-01 DIAGNOSIS — Z86010 Personal history of colonic polyps: Secondary | ICD-10-CM

## 2017-03-01 HISTORY — PX: COLONOSCOPY: SHX174

## 2017-03-01 MED ORDER — SODIUM CHLORIDE 0.9 % IV SOLN: 500.0000 mL | INTRAVENOUS | Status: DC

## 2017-03-01 NOTE — Progress Notes (Signed)
Report given to PACU, vss 

## 2017-03-01 NOTE — Patient Instructions (Signed)
YOU HAD AN ENDOSCOPIC PROCEDURE TODAY AT [facility]:   Refer to the procedure report that was given to you for any specific questions about what was found during the examination.  If the procedure report does not answer your questions, please call your gastroenterologist to clarify.  If you requested that your care partner not be given the details of your procedure findings, then the procedure report has been included in a sealed envelope for you to review at your convenience later.  YOU SHOULD EXPECT: Some feelings of bloating in the abdomen. Passage of more gas than usual.  Walking can help get rid of the air that was put into your GI tract during the procedure and reduce the bloating. If you had a lower endoscopy (such as a colonoscopy or flexible sigmoidoscopy) you may notice spotting of blood in your stool or on the toilet paper. If you underwent a bowel prep for your procedure, you may not have a normal bowel movement for a few days.  Please Note:  You might notice some irritation and congestion in your nose or some drainage.  This is from the oxygen used during your procedure.  There is no need for concern and it should clear up in a day or so.  SYMPTOMS TO REPORT IMMEDIATELY:   Following lower endoscopy (colonoscopy or flexible sigmoidoscopy):  Excessive amounts of blood in the stool  Significant tenderness or worsening of abdominal pains  Swelling of the abdomen that is new, acute  Fever of 100F or higher   For urgent or emergent issues, a gastroenterologist can be reached at any hour by calling [phone].   DIET:  We do recommend a small meal at first, but then you may proceed to your regular diet.  Drink plenty of fluids but you should avoid alcoholic beverages for 24 hours.  ACTIVITY:  You should plan to take it easy for the rest of today and you should NOT DRIVE or use heavy machinery until tomorrow (because of the sedation medicines used during the test).     FOLLOW UP: Our staff will call the number listed on your records the next business day following your procedure to check on you and address any questions or concerns that you may have regarding the information given to you following your procedure. If we do not reach you, we will leave a message.  However, if you are feeling well and you are not experiencing any problems, there is no need to return our call.  We will assume that you have returned to your regular daily activities without incident.  If any biopsies were taken you will be contacted by phone or by letter within the next 1-3 weeks.  Please call us at [phone] if you have not heard about the biopsies in 3 weeks.    SIGNATURES/CONFIDENTIALITY: You and/or your care partner have signed paperwork which will be entered into your electronic medical record.  These signatures attest to the fact that that the information above on your After Visit Summary has been reviewed and is understood.  Full responsibility of the confidentiality of this discharge information lies with you and/or your care-partner.   Resume medications.

## 2017-03-01 NOTE — Op Note (Signed)
[place] Patient Name: Tanner Bishop Procedure Date: 03/01/2017 7:55 AM MRN: [id number] Endoscopist: [place]. Loletha Carrow , MD Age: 71 Referring MD:  Date of Birth: 09-May-1946 Gender: Male Account #: [id number] Procedure:                Colonoscopy Indications:              Surveillance: Personal history of adenomatous                            polyps on last colonoscopy 5 years ago Medicines:                Monitored Anesthesia Care Procedure:                Pre-Anesthesia Assessment:                           - Prior to the procedure, a History and Physical                            was performed, and patient medications and                            allergies were reviewed. The patient's tolerance of                            previous anesthesia was also reviewed. The risks                            and benefits of the procedure and the sedation                            options and risks were discussed with the patient.                            All questions were answered, and informed consent                            was obtained. Prior Anticoagulants: The patient has                            taken no previous anticoagulant or antiplatelet                            agents. ASA Grade Assessment: III - A patient with                            severe systemic disease. After reviewing the risks                            and benefits, the patient was deemed in                            satisfactory condition to undergo the procedure.  After obtaining informed consent, the colonoscope                            was passed under direct vision. Throughout the                            procedure, the patient's blood pressure, pulse, and                            oxygen saturations were monitored continuously. The                            Colonoscope was introduced through the anus and                            advanced to the the cecum,  identified by                            appendiceal orifice and ileocecal valve. The                            colonoscopy was performed without difficulty. The                            patient tolerated the procedure well. The quality                            of the bowel preparation was good. The ileocecal                            valve, appendiceal orifice, and rectum were                            photographed. Scope In: 8:00:15 AM Scope Out: 8:16:58 AM Scope Withdrawal Time: 0 hours 9 minutes 50 seconds  Total Procedure Duration: 0 hours 16 minutes 43 seconds  Findings:                 The perianal and digital rectal examinations were                            normal.                           The entire examined colon appeared normal on direct                            and retroflexion views. Complications:            No immediate complications. Estimated Blood Loss:     Estimated blood loss: none. Impression:               - The entire examined colon is normal on direct and                            retroflexion views.                           -  No specimens collected. Recommendation:           - Patient has a contact number available for                            emergencies. The signs and symptoms of potential                            delayed complications were discussed with the                            patient. Return to normal activities tomorrow.                            Written discharge instructions were provided to the                            patient.                           - Resume previous diet.                           - Continue present medications.                           - Repeat colonoscopy in 5 years for screening due                            to a family history of colon cancer (brother). Kenzli Barritt L. Loletha Carrow, MD 03/01/2017 8:21:23 AM This report has been signed electronically.

## 2017-03-02 ENCOUNTER — Ambulatory Visit (INDEPENDENT_AMBULATORY_CARE_PROVIDER_SITE_OTHER): Payer: PPO | Admitting: Internal Medicine

## 2017-03-02 ENCOUNTER — Telehealth: Payer: Self-pay

## 2017-03-02 ENCOUNTER — Other Ambulatory Visit (INDEPENDENT_AMBULATORY_CARE_PROVIDER_SITE_OTHER): Payer: PPO

## 2017-03-02 ENCOUNTER — Encounter: Payer: Self-pay | Admitting: Internal Medicine

## 2017-03-02 DIAGNOSIS — J84112 Idiopathic pulmonary fibrosis: Secondary | ICD-10-CM | POA: Diagnosis not present

## 2017-03-02 LAB — HEPATIC FUNCTION PANEL
ALT: 10 U/L (ref 0–53)
AST: 18 U/L (ref 0–37)
Albumin: 3.8 g/dL (ref 3.5–5.2)
Alkaline Phosphatase: 55 U/L (ref 39–117)
Bilirubin, Direct: 0.1 mg/dL (ref 0.0–0.3)
Total Bilirubin: 0.2 mg/dL (ref 0.2–1.2)
Total Protein: 6.9 g/dL (ref 6.0–8.3)

## 2017-03-02 NOTE — Patient Instructions (Signed)
Idiopathic pulmonary fibrosis, failed pirfenidone Sept 2014 due to GI side effects; PRAISE study 2017, On Ofev as of Mrch 2018 Disease might be progressive a bit. Not sure  Plan - encoruage getting back with [place] - continue treadmill exercise with pulse oxmonitoring - Do LFT 03/02/2017 for Ofev monitoring  - continue ofev as before with lomotil as needed - in 3 months do Pre-bd spiro and dlco only. No lung volume or bd response.  - depndng on PFT results at followup can do CT chest  Followup 3 months with Dr Chase Caller but after PFT testing

## 2017-03-02 NOTE — Progress Notes (Signed)
Subjective:     Patient ID: Tanner Bishop, male   DOB: [birth year], 71 y.o.   MRN: [id number]  HPI   #IPF - uip path 03/22/13  - On OFEV since early May 2015   PFT FVC fev1 ratio BD fev1 TLC DLCO Walk test 175f x 3 laps wt rx             Spring 2014 2.9:L L/% /%        June 2015 2.7L/67% 2.34L/78% 86  3.68/57% 20.24/71%   ofev start  [name]  2016 2.5L/55% 2.1L/60% 84/108%    No desat, Pk HR 130    02/11/2015 Screening visit afferent cough study 2.59L/56% 2.24L/63% 87/112%    Dx with afluttter on ekg   Screen failed due to hx of stones  03/26/2015        No desat, PK HR 130    08/26/15 pft machine 2.73L/68% 2.38L/80% 87   [name]     06/29/2016 Office spiro 2.49L/56% 2.14L/65%     Pk HR 90 and lowest pulse ox 99% ->93%  ofev  11/02/2016  office full pft  2.6qL/66% 2.3L/79%    17.23/60%   pfev       OV 03/26/2015  Chief Complaint  Patient presents with  . Follow-up    [name], dry cough. CATH procedure on 04/08/15. Denies any chest tightnes/congestion.    71year old male with idiopathic pulmonary fibrosis. He is on Ofev after having failed Esbriet in the past due to side effects. He is tolerating Ofev well. Liver function test in March 2016 was normal. I last saw him in January 2016. Subsequently in February 2016 we screened him for a cough idiopathic pulmonary fibrosis study called afferent but he screen failed due to history of renal stones. At this time he was incidentally diagnosed with new onset atrial flutter. He has seen Dr. GCrissie Sicklesand has been started on anticoagulation with Xarelto and metoprolol. He is due to undergo a ablation. He is not aware of the increased bleeding risk with Ofev in the setting of anticoagulation. He assures me that the anti-coag and is only until he undergoes ablation and after that the plan is to stop it. Therefore only short-term anticoagulation with Xarelto. Overall his effort tolerance is fine. He has postponed his [facility] lung  transplant until he completes his ablation.  Walking desaturation test in the office today he did not desaturate. This is stable. Spirometry not done   Past medical history: Atrial flutter new diagnoses as above   OV 06/29/2016  Chief Complaint  Patient presents with  . Follow-up    pt states he is at baseline: sob with exertion, nonprod cough.      71year old male with idiopathic pulmonary fibrosis. He is on Ofev . He completed 6 months of PRAISE study (([id number] placebo) in l;ate winter/early spring 2017. [place] Overall stable. No worsening dyspnea and cough. Continues daily exercise is walking many miles. Some days he is sitting more than the others. He is interested in more trials and results of the praise study. These results are not out yet. He is compliant with his Ofe last liver function test was in February 2017. There are no new issues. He believes his atrial fibrillation is in sinus rhythm;    OV  11/02/2016  Chief Complaint  Patient presents with  . Follow-up    4 month IPF follow up and B&A review - does report increased prod cough  with yellow mucus, wheezing, tightness in chest, increased SOB, x3-4 weeks with the weather change.  denies any f/c/s, hemoptysis, chest pain   IPF - on ofev. Last liver function test was in July 2017 and normal. Overall dyspnea stable. However he is been having diarrhea with ofev. He called in and we reduced it to 1 tablet a day and this improved the diarrhea. He back on 1 tablet twice a day. The diarrhea has returned although it is much milder. He has never taken any medication for this. Are function test shows slight improvement from July 2017 and similar levels to approximately one year ago  The new issue that he's having significant recurrent sinus infection in the back of chronic postnasal drainage. This since the fall 2017. He says that he and his wife catch respirator infection and passive back and forth to each other. He is  having cough, chest tightness, postnasal drip and yellow sputum. It is not affecting any dyspnea. There is no wheeze. There is no fever or hemoptysis.  OV 03/02/2017  Chief Complaint  Patient presents with  . Follow-up    Pt states his breathing is at baseline. Pt c/o dry cough - pt states this is baseline. Pt denies CP/tightness.     Idiopathic pulmonary fibrosis on Ofev. Last seen November 2017. He is a routine follow-up.  He is here with his wife. His sinus infections a result. He is interested in research protocols but we have none right now. He is tolerating his Ofev associated with some mild diarrhea occasional which she takes medication. He is not much of a problem. He tells me that she walks 6 times a week for 5 miles a day. On the treadmill at a 5 incline walking at 3-1/2 miles an hour. For this he feels a little more dyspneic than before. Walking desaturation test 185 feet 3 laps on room air: His pulse ox dropped from 98% at rest and 93% with exertion. His heart rate jumped from 66 at rest and 97 at peak exertion. Features are suggestive of mild progression of the disease. He is not attending pulmonary fibrosis [facility] because he felt this was negative but that was years ago. We discussed this and he might be interested again.     has a past medical history of Allergy; Arthritis; Atrial flutter ([place]); Cataract; GERD (gastroesophageal reflux disease); H/O hiatal hernia; Hyperlipidemia; Injury of right hand; Pulmonary fibrosis ([place]); Renal stone (06/2014); and Ulcer ([place]).   reports that he quit smoking about 38 years ago. His smoking use included Cigarettes. He has a 16.00 pack-year smoking history. He has never used smokeless tobacco.  Past Surgical History:  Procedure Laterality Date  . ATRIAL FLUTTER ABLATION N/A 04/08/2015   Procedure: ATRIAL FLUTTER ABLATION;  Surgeon: Evans Lance, MD;  Location: [facility] CATH LAB;  Service: Cardiovascular;  Laterality: N/A;  . CATARACT  EXTRACTION, BILATERAL  2016  . COLONOSCOPY  02-24-12   adenomatous polyps removed, repeat in 5 yrs, per Dr. Deatra Ina   . ESOPHAGOGASTRODUODENOSCOPY  2007  . EXTRACORPOREAL SHOCK WAVE LITHOTRIPSY  06-17-14   per Dr. Jeffie Pollock   . HAND SURGERY Right   . LUNG BIOPSY Left 03/22/2013   Procedure: LUNG BIOPSY;  Surgeon: Melrose Nakayama, MD;  Location: [facility];  Service: Thoracic;  Laterality: Left;  . POLYPECTOMY    . UPPER GASTROINTESTINAL ENDOSCOPY    . VIDEO ASSISTED THORACOSCOPY Left 03/22/2013   Procedure: VIDEO ASSISTED THORACOSCOPY;  Surgeon: Melrose Nakayama, MD;  Location: [facility] OR;  Service: Thoracic;  Laterality: Left;    No Known Allergies  Immunization History  Administered Date(s) Administered  . Influenza Split 09/13/2013  . Influenza, High Dose Seasonal PF 10/06/2016  . Influenza-Unspecified 09/07/2014, 09/14/2015  . Pneumococcal Conjugate-13 02/16/2017  . Pneumococcal Polysaccharide-23 05/26/2013  . Td 12/15/2007  . Zoster 02/19/2014    Family History  Problem Relation Age of Onset  . Liver cancer Mother   . Diabetes Sister   . Lung cancer Sister   . Heart disease Brother   . Diabetes Brother   . Colon cancer Brother   . Diabetes    . Cancer      prostate  . Colon polyps Neg Hx   . Esophageal cancer Neg Hx   . Rectal cancer Neg Hx   . Stomach cancer Neg Hx      Current Outpatient Prescriptions:  .  chlorpheniramine-HYDROcodone (TUSSIONEX PENNKINETIC ER) 10-8 MG/5ML SUER, Take 5 mLs by mouth every 12 (twelve) hours as needed for cough., Disp: 140 mL, Rfl: 0 .  diclofenac (VOLTAREN) 75 MG EC tablet, Take 1 tablet (75 mg total) by mouth 2 (two) times daily., Disp: 180 tablet, Rfl: 3 .  diphenhydrAMINE (BENADRYL) 25 MG tablet, Take 25 mg by mouth every 6 (six) hours as needed for allergies or sleep., Disp: , Rfl:  .  fluticasone (FLONASE) 50 MCG/ACT nasal spray, Place 2 sprays into both nostrils daily. (Patient taking differently: Place 2 sprays into both nostrils 2  (two) times daily. ), Disp: 16 g, Rfl: 5 .  GARLIC OIL PO, Take 1 capsule by mouth daily., Disp: , Rfl:  .  Nintedanib (OFEV) 150 MG CAPS, Take 1 tablet by mouth 2 (two) times daily., Disp: 60 capsule, Rfl: 5 .  pantoprazole (PROTONIX) 40 MG tablet, Take 1 tablet (40 mg total) by mouth 2 (two) times daily., Disp: 180 tablet, Rfl: 3 .  ranitidine (ZANTAC) 300 MG tablet, TAKE 1 TABLET BY MOUTH AT BEDTIME, Disp: 90 tablet, Rfl: 1 .  zolpidem (AMBIEN) 5 MG tablet, Take 1 tablet (5 mg total) by mouth at bedtime as needed for sleep., Disp: 90 tablet, Rfl: 1  Current Facility-Administered Medications:  .  0.9 %  sodium chloride infusion, 500 mL, Intravenous, Continuous, Nelida Meuse III, MD   Review of Systems     Objective:   Physical Exam  Constitutional: He is oriented to person, place, and time. He appears well-developed and well-nourished. No distress.  HENT:  Head: Normocephalic and atraumatic.  Right Ear: External ear normal.  Left Ear: External ear normal.  Mouth/Throat: Oropharynx is clear and moist. No oropharyngeal exudate.  Eyes: Conjunctivae and EOM are normal. Pupils are equal, round, and reactive to light. Right eye exhibits no discharge. Left eye exhibits no discharge. No scleral icterus.  Neck: Normal range of motion. Neck supple. No JVD present. No tracheal deviation present. No thyromegaly present.  Cardiovascular: Normal rate, regular rhythm and intact distal pulses.  Exam reveals no gallop and no friction rub.   No murmur heard. Pulmonary/Chest: Effort normal. No respiratory distress. He has no wheezes. He has rales. He exhibits no tenderness.  Crackles at bottom 1/4th  Abdominal: Soft. Bowel sounds are normal. He exhibits no distension and no mass. There is no tenderness. There is no rebound and no guarding.  Musculoskeletal: Normal range of motion. He exhibits no edema or tenderness.  Lymphadenopathy:    He has no cervical adenopathy.  Neurological: He is alert and  oriented  to person, place, and time. He has normal reflexes. No cranial nerve deficit. Coordination normal.  Skin: Skin is warm and dry. No rash noted. He is not diaphoretic. No erythema. No pallor.  Burn right hand - old scar  Psychiatric: He has a normal mood and affect. His behavior is normal. Judgment and thought content normal.  Nursing note and vitals reviewed.  Vitals:   03/02/17 0919  BP: 118/70  Pulse: 71  SpO2: 94%  Weight: 170 lb (77.1 kg)  Height: _0  (1.727 m)    Estimated body mass index is 25.85 kg/m as calculated from the following:   Height as of this encounter: _1  (1.727 m).   Weight as of this encounter: 170 lb (77.1 kg).     Assessment:       ICD-9-CM ICD-10-CM   1. IPF (idiopathic pulmonary fibrosis) (HCC) 516.31 J84.112        Plan:     IPF (idiopathic pulmonary fibrosis) (HCC) Disease might be progressive a bit. Not sure  Plan - encoruage getting back with [place] - continue treadmill exercise with pulse oxmonitoring - Do LFT 03/02/2017 for Ofev monitoring  - continue ofev as before with lomotil as needed - in 3 months do Pre-bd spiro and dlco only. No lung volume or bd response.  - depndng on PFT results at followup can do CT chest - continue GERD Rx  Followup 3 months with Dr Chase Caller but after PFT testing Discuss transplant at followup     Dr. Brand Males, M.D., [facility].C.P Pulmonary and Critical Care Medicine Staff Physician [facility] Pager: [phone], If no answer or between  15:00h - 7:00h: call [phone]  03/02/2017 9:57 AM

## 2017-03-02 NOTE — Assessment & Plan Note (Addendum)
Disease might be progressive a bit. Not sure  Plan - encoruage getting back with [place] - continue treadmill exercise with pulse oxmonitoring - Do LFT 03/02/2017 for Ofev monitoring  - continue ofev as before with lomotil as needed - in 3 months do Pre-bd spiro and dlco only. No lung volume or bd response.  - depndng on PFT results at followup can do CT chest - continue GERD Rx  Followup 3 months with Dr Chase Caller but after PFT testing Discuss transplant at followup

## 2017-03-02 NOTE — Telephone Encounter (Signed)
  Follow up Call-  Call back number 03/01/2017  Post procedure Call Back phone  # [phone]  Permission to leave phone message Yes  Some recent data might be hidden     Patient questions:  Do you have a fever, pain , or abdominal swelling? No. Pain Score  0 *  Have you tolerated food without any problems? Yes.    Have you been able to return to your normal activities? Yes.    Do you have any questions about your discharge instructions: Diet   No. Medications  No. Follow up visit  No.  Do you have questions or concerns about your Care? No.  Actions: * If pain score is 4 or above: No action needed, pain <4.  No problems per pt. maw

## 2017-03-05 NOTE — Progress Notes (Signed)
Called and spoke to pt. Informed him of the results per MR. Pt verbalized understanding and denied any further questions or concerns at this time.

## 2017-03-10 ENCOUNTER — Encounter (HOSPITAL_COMMUNITY): Payer: Self-pay

## 2017-03-18 ENCOUNTER — Other Ambulatory Visit: Payer: Self-pay | Admitting: Internal Medicine

## 2017-03-22 ENCOUNTER — Encounter: Payer: Self-pay | Admitting: Family Medicine

## 2017-03-22 ENCOUNTER — Ambulatory Visit (INDEPENDENT_AMBULATORY_CARE_PROVIDER_SITE_OTHER): Payer: PPO | Admitting: Family Medicine

## 2017-03-22 VITALS — BP 138/74 | HR 89 | Temp 98.2°F | Ht 68.0 in | Wt 171.0 lb

## 2017-03-22 DIAGNOSIS — M545 Low back pain, unspecified: Secondary | ICD-10-CM

## 2017-03-22 DIAGNOSIS — M4850XA Collapsed vertebra, not elsewhere classified, site unspecified, initial encounter for fracture: Secondary | ICD-10-CM | POA: Diagnosis not present

## 2017-03-22 DIAGNOSIS — G8929 Other chronic pain: Secondary | ICD-10-CM | POA: Diagnosis not present

## 2017-03-22 DIAGNOSIS — IMO0001 Reserved for inherently not codable concepts without codable children: Secondary | ICD-10-CM

## 2017-03-22 MED ORDER — TRAMADOL HCL 50 MG PO TABS: 100.0000 mg | ORAL_TABLET | Freq: Four times a day (QID) | ORAL | 2 refills | Status: DC | PRN

## 2017-03-22 NOTE — Progress Notes (Signed)
Pre visit review using our clinic review tool, if applicable. No additional management support is needed unless otherwise documented below in the visit note.

## 2017-03-22 NOTE — Progress Notes (Signed)
   Subjective:    Patient ID: Tanner Bishop, male    DOB: 1946/01/23, 71 y.o.   MRN: [id number]  HPI Here for low back pain. This is an aching pain that does not radiate to the legs. No numbness or weakness of the legs. 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 He has tried Diclofenac but had to stop this due to it causing bloody diarrhea. This has resolved since he stopped taking it. Of note he had a colonoscopy on 03-01-17 that was totally clear.    Review of Systems  Respiratory: Negative.   Cardiovascular: Negative.   Gastrointestinal: Negative.   Genitourinary: Negative.   Musculoskeletal: Positive for back pain. Negative for gait problem.  Neurological: Positive for weakness and numbness.       Objective:   Physical Exam  Constitutional: He is oriented to person, place, and time. He appears well-developed and well-nourished. No distress.  Cardiovascular: Normal rate, regular rhythm, normal heart sounds and intact distal pulses.   Pulmonary/Chest: Effort normal and breath sounds normal.  Musculoskeletal:  Lower spine is normal on exam  Neurological: He is alert and oriented to person, place, and time. No cranial nerve deficit. He exhibits normal muscle tone. Coordination normal.          Assessment & Plan:  Low back pain, try Tramadol for pain. Set up an MRI of the lower spine soon.  Alysia Penna, MD

## 2017-03-22 NOTE — Patient Instructions (Signed)
WE NOW OFFER   [facility] FAST TRACK!!!  SAME DAY Appointments for ACUTE CARE  Such as: Sprains, Injuries, cuts, abrasions, rashes, muscle pain, joint pain, back pain Colds, flu, sore throats, headache, allergies, cough, fever  Ear pain, sinus and eye infections Abdominal pain, nausea, vomiting, diarrhea, upset stomach Animal/insect bites  3 Easy Ways to Schedule: Walk-In Scheduling Call in scheduling Mychart Sign-up: [email or web address]         

## 2017-03-23 ENCOUNTER — Other Ambulatory Visit: Payer: Self-pay | Admitting: Family Medicine

## 2017-03-23 DIAGNOSIS — Z77018 Contact with and (suspected) exposure to other hazardous metals: Secondary | ICD-10-CM

## 2017-04-06 ENCOUNTER — Other Ambulatory Visit: Payer: Self-pay | Admitting: Family Medicine

## 2017-04-07 ENCOUNTER — Other Ambulatory Visit: Payer: PPO

## 2017-04-07 ENCOUNTER — Ambulatory Visit
Admission: RE | Admit: 2017-04-07 | Discharge: 2017-04-07 | Disposition: A | Discharge status: Home / Self Care | Payer: PPO | Source: Ambulatory Visit | Attending: Family Medicine | Admitting: Family Medicine

## 2017-04-07 DIAGNOSIS — M545 Low back pain, unspecified: Secondary | ICD-10-CM

## 2017-04-07 DIAGNOSIS — M48061 Spinal stenosis, lumbar region without neurogenic claudication: Secondary | ICD-10-CM | POA: Diagnosis not present

## 2017-04-07 DIAGNOSIS — Z77018 Contact with and (suspected) exposure to other hazardous metals: Secondary | ICD-10-CM

## 2017-04-07 DIAGNOSIS — G8929 Other chronic pain: Secondary | ICD-10-CM

## 2017-04-07 DIAGNOSIS — Z0389 Encounter for observation for other suspected diseases and conditions ruled out: Secondary | ICD-10-CM | POA: Diagnosis not present

## 2017-04-07 NOTE — Addendum Note (Signed)
Addended by: Alysia Penna A on: 04/07/2017 04:29 PM   Modules accepted: Orders

## 2017-04-09 ENCOUNTER — Other Ambulatory Visit: Payer: Self-pay | Admitting: Family Medicine

## 2017-04-09 DIAGNOSIS — IMO0001 Reserved for inherently not codable concepts without codable children: Secondary | ICD-10-CM

## 2017-04-09 DIAGNOSIS — M4850XA Collapsed vertebra, not elsewhere classified, site unspecified, initial encounter for fracture: Principal | ICD-10-CM

## 2017-04-14 ENCOUNTER — Ambulatory Visit
Admission: RE | Admit: 2017-04-14 | Discharge: 2017-04-14 | Disposition: A | Discharge status: Home / Self Care | Payer: PPO | Source: Ambulatory Visit | Attending: Family Medicine | Admitting: Family Medicine

## 2017-04-14 DIAGNOSIS — M4850XA Collapsed vertebra, not elsewhere classified, site unspecified, initial encounter for fracture: Secondary | ICD-10-CM | POA: Diagnosis not present

## 2017-04-14 DIAGNOSIS — IMO0001 Reserved for inherently not codable concepts without codable children: Secondary | ICD-10-CM

## 2017-05-19 ENCOUNTER — Ambulatory Visit (INDEPENDENT_AMBULATORY_CARE_PROVIDER_SITE_OTHER): Payer: PPO | Admitting: Internal Medicine

## 2017-05-19 ENCOUNTER — Other Ambulatory Visit (INDEPENDENT_AMBULATORY_CARE_PROVIDER_SITE_OTHER): Payer: PPO

## 2017-05-19 ENCOUNTER — Encounter: Payer: Self-pay | Admitting: Internal Medicine

## 2017-05-19 VITALS — BP 128/96 | HR 60 | Ht 68.0 in | Wt 169.8 lb

## 2017-05-19 DIAGNOSIS — J84112 Idiopathic pulmonary fibrosis: Secondary | ICD-10-CM

## 2017-05-19 LAB — PULMONARY FUNCTION TEST
DL/VA % pred: 91 %
DL/VA: 4.02 ml/min/mmHg/L
DLCO cor % pred: 53 %
DLCO cor: 15.08 ml/min/mmHg
DLCO unc % pred: 55 %
DLCO unc: 15.83 ml/min/mmHg
FEF 25-75 Post: 4.7 L/sec
FEF 25-75 Pre: 3.24 L/sec
FEF2575-%Change-Post: 45 %
FEF2575-%Pred-Post: 213 %
FEF2575-%Pred-Pre: 147 %
FEV1-%Change-Post: 4 %
FEV1-%Pred-Post: 79 %
FEV1-%Pred-Pre: 76 %
FEV1-Post: 2.3 L
FEV1-Pre: 2.2 L
FEV1FVC-%Change-Post: 1 %
FEV1FVC-%Pred-Pre: 118 %
FEV6-%Change-Post: 2 %
FEV6-%Pred-Post: 69 %
FEV6-%Pred-Pre: 67 %
FEV6-Post: 2.58 L
FEV6-Pre: 2.51 L
FEV6FVC-%Pred-Post: 106 %
FEV6FVC-%Pred-Pre: 106 %
FVC-%Change-Post: 2 %
FVC-%Pred-Post: 65 %
FVC-%Pred-Pre: 63 %
FVC-Post: 2.58 L
FVC-Pre: 2.51 L
Post FEV1/FVC ratio: 89 %
Post FEV6/FVC ratio: 100 %
Pre FEV1/FVC ratio: 88 %
Pre FEV6/FVC Ratio: 100 %
RV % pred: 52 %
RV: 1.19 L
TLC % pred: 57 %
TLC: 3.72 L

## 2017-05-19 LAB — HEPATIC FUNCTION PANEL
ALT: 8 U/L (ref 0–53)
AST: 17 U/L (ref 0–37)
Albumin: 4.1 g/dL (ref 3.5–5.2)
Alkaline Phosphatase: 47 U/L (ref 39–117)
Bilirubin, Direct: 0 mg/dL (ref 0.0–0.3)
Total Bilirubin: 0.4 mg/dL (ref 0.2–1.2)
Total Protein: 7.4 g/dL (ref 6.0–8.3)

## 2017-05-19 NOTE — Progress Notes (Signed)
PFT done today.

## 2017-05-19 NOTE — Progress Notes (Signed)
Subjective:     Patient ID: Tanner Bishop, male   DOB: 19-Mar-1946, 71 y.o.   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  He was last seen 03/02/2017. At that time, walking desaturation test 185 feet 3 laps on room air: His pulse ox dropped from 98% at rest and 93% with exertion. His heart rate jumped from 66 at rest and 97 at peak exertion.  Today he feels he is doing well overall. Tolerating Ofev still. Still has the mild diarrhea that is controlled. He is still trying to walk 6 days a week, 5 miles a day. Still doing this at a 5 degree incline at about 3.5 miles an hour. His dyspnea during this is stable. He had not received an email for the pulmonary fibrosis foundation. He is dealing with health problems with his wife (dementia) and is not interested in lung transplantation. He last saw [facility] about a year ago.   05/19/2017 Walking desaturation test 185 feet 3 laps on room air: His pulse ox dropped from 99% at rest and 96% with exertion. His heart rate jumped from 67 at rest and 79 at peak exertion.  IPF:  - uip path 03/22/13 - On OFEV since early May 2015  PFT FVC fev1 ratio BD fev1 TLC DLCO Walk test 157f x 3 laps wt rx  Spring 2014 2.9:L L/% /%        June 2015 2.7L/67% 2.34L/78% 86  3.68/57% 20.24/71%   ofev start  [name]  2016 2.5L/55% 2.1L/60% 84/108%    No desat, Pk HR 130    02/11/2015 Screening visit afferent cough study 2.59L/56% 2.24L/63% 87/112%    Dx with afluttter on ekg   Screen failed due to hx of stones  03/26/2015        No desat, PK HR 130    08/26/15 pft machine 2.73L/68% 2.38L/80% 87   [name]     11/02/2016  office full pft  2.6qL/66% 2.3L/79%    17.23/60%   pfev  05/19/2017 Full PFT 2.51L/63% 2.2L/76% 88% 2.3L/79% 3.72L/57% 15.08/53% No desat, peak HR 79 77kg Ofev    Review of Systems Constitutional: no fevers/chills Eyes: no vision  changes Ears, nose, mouth, throat, and face: +chronic cough Respiratory: no shortness of breath Cardiovascular: no chest pain Gastrointestinal: no nausea/vomiting, no abdominal pain, no constipation, + occasional diarrhea Genitourinary: no dysuria, no hematuria Integument: no rash Hematologic/lymphatic: no bleeding/bruising, no edema Musculoskeletal: no arthralgias, no myalgias Neurological: no paresthesias, no weakness   has a past medical history of Allergy; Arthritis; Atrial flutter ([place]; Cataract; GERD (gastroesophageal reflux disease); H/O hiatal hernia; Hyperlipidemia; Injury of right hand; Pulmonary fibrosis ([place]; Renal stone (06/2014); and Ulcer.     Objective:   Physical Exam BP (!) 128/96 (BP Location: Left Arm, Patient Position: Sitting, Cuff Size: Normal)   Pulse 60   Ht _0  (1.727 m)   Wt 169 lb 12.8 oz (77 kg)   SpO2 97%   BMI 25.82 kg/m  General Apperance: NAD HEENT: Normocephalic, atraumatic, anicteric sclera Neck: Supple, trachea midline Lungs: Clear to auscultation bilaterally. No wheezes, rhonchi or rales. Breathing comfortably Heart: Regular rate and rhythm, no murmur/rub/gallop Abdomen: Soft, nontender, nondistended, no rebound/guarding Extremities: Warm and well perfused, no edema Skin: No rashes or lesions, mild clubbing Neurologic: Alert and interactive. No gross deficits.    Assessment:     Idiopathic pulmonary fibrosis with mild progression - lung function declining,  with most decline in the last 6 months    Plan:     - encourage getting involved with Pulmonary Fibrosis [facility] - continue treadmill exercise  - LFT today for Ofev monitoring - continue Ofev - continue Zantac - will let him know if trial comes up that he could qualify for  Follow up in 4-6 months. Spirometry, DLCO at that time. No lung volume or BD response.  Jacques Earthly, MD  Internal Medicine PGY-3 05/19/17 10:25 AM

## 2017-05-19 NOTE — Patient Instructions (Addendum)
IPF (idiopathic pulmonary fibrosis) ([place])  Your disease is mildly progressive. 3-4% in the last one year.  Plan: LFT today for Ofev monitoring Respect your desire to decline transplant evaluation Continue Ofev as before Dr Chase Caller will talk to support group leader again to get you involved Follow up in 4-6 months. Spirometry, DLCO at that time. No lung volume or BD response.

## 2017-05-24 NOTE — Progress Notes (Signed)
LMTCB

## 2017-05-26 NOTE — Progress Notes (Signed)
Left message for patient to call for medical results. A letter will be sent to address on file informing patient to contact office.

## 2017-05-27 ENCOUNTER — Telehealth: Payer: Self-pay | Admitting: Internal Medicine

## 2017-05-27 NOTE — Telephone Encounter (Signed)
Notes recorded by Brand Males, MD on 05/19/2017 at 1:25 PM EDT nomral lft ---------------------------- Spoke with pt. He is aware of results. Nothing further was needed.

## 2017-06-01 ENCOUNTER — Encounter: Payer: Self-pay | Admitting: *Deleted

## 2017-08-31 ENCOUNTER — Telehealth: Payer: Self-pay | Admitting: Internal Medicine

## 2017-08-31 MED ORDER — DOXYCYCLINE HYCLATE 100 MG PO TABS: 100.0000 mg | ORAL_TABLET | Freq: Two times a day (BID) | ORAL | 0 refills | Status: DC

## 2017-08-31 MED ORDER — HYDROCOD POLST-CPM POLST ER 10-8 MG/5ML PO SUER: 5.0000 mL | Freq: Two times a day (BID) | ORAL | 0 refills | Status: DC | PRN

## 2017-08-31 MED ORDER — PREDNISONE 10 MG PO TABS: ORAL_TABLET | ORAL | 0 refills | Status: DC

## 2017-08-31 NOTE — Telephone Encounter (Signed)
Spoke with patient. He is aware of MR's recs. Doxy and prednisone have been called into the pharmacy. Advised patient that he would need to stop by the office to pick up the RX for the cough syrup.   He verbalized understanding. Nothing else needed at time of call.

## 2017-08-31 NOTE — Telephone Encounter (Signed)
Called and spoke with pt. Pt reports of prod cough with yellow mucus., increased sob, wheezing & chest tightness x1w Pt taken mucinex with no improvement.  Pt is requesting Rx for Tussionex, as this has helped with cough previously.  MR please advise. Thanks

## 2017-08-31 NOTE — Telephone Encounter (Signed)
Seems to be having acute bronchitis  Plan Take doxycycline 142m po twice daily x 5 days; take after meals and avoid sunlight  Please take prednisone 40 mg x1 day, then 30 mg x1 day, then 20 mg x1 day, then 10 mg x1 day, and then 5 mg x1 day and stop  Tussinex 565mbid prn x 10 days  If not better or getting worse - call usKorear go to ER   Dr. MuBrand MalesM.D., F.[facility].P Pulmonary and Critical Care Medicine Staff Physician, Co[place]ak[place]irector - Interstitial Lung Disease  Pulmonary FiAltont LeThe [id number]: [phone] no answer or between  15:00h - 7:00h: call [phone] Telephone: [phone]

## 2017-09-14 ENCOUNTER — Other Ambulatory Visit: Payer: Self-pay | Admitting: Internal Medicine

## 2017-09-16 ENCOUNTER — Other Ambulatory Visit (INDEPENDENT_AMBULATORY_CARE_PROVIDER_SITE_OTHER): Payer: PPO

## 2017-09-16 ENCOUNTER — Encounter: Payer: Self-pay | Admitting: Internal Medicine

## 2017-09-16 ENCOUNTER — Ambulatory Visit (INDEPENDENT_AMBULATORY_CARE_PROVIDER_SITE_OTHER): Payer: PPO | Admitting: Internal Medicine

## 2017-09-16 VITALS — BP 110/70 | HR 74 | Ht 67.0 in | Wt 167.0 lb

## 2017-09-16 DIAGNOSIS — J84112 Idiopathic pulmonary fibrosis: Secondary | ICD-10-CM

## 2017-09-16 DIAGNOSIS — Z23 Encounter for immunization: Secondary | ICD-10-CM

## 2017-09-16 DIAGNOSIS — R05 Cough: Secondary | ICD-10-CM | POA: Diagnosis not present

## 2017-09-16 DIAGNOSIS — R053 Chronic cough: Secondary | ICD-10-CM

## 2017-09-16 DIAGNOSIS — M2550 Pain in unspecified joint: Secondary | ICD-10-CM | POA: Diagnosis not present

## 2017-09-16 LAB — PULMONARY FUNCTION TEST
FEF 25-75 Pre: 3.63 L/sec
FEF2575-%Pred-Pre: 166 %
FEV1-%Pred-Pre: 77 %
FEV1-Pre: 2.23 L
FEV1FVC-%Pred-Pre: 119 %
FEV6-%Pred-Pre: 68 %
FEV6-Pre: 2.53 L
FEV6FVC-%Pred-Pre: 106 %
FVC-%Pred-Pre: 64 %
FVC-Pre: 2.53 L
Pre FEV1/FVC ratio: 88 %
Pre FEV6/FVC Ratio: 100 %

## 2017-09-16 LAB — SEDIMENTATION RATE: Sed Rate: 43 mm/hr — ABNORMAL HIGH (ref 0–20)

## 2017-09-16 LAB — HEPATIC FUNCTION PANEL
ALT: 9 U/L (ref 0–53)
AST: 15 U/L (ref 0–37)
Albumin: 4.1 g/dL (ref 3.5–5.2)
Alkaline Phosphatase: 60 U/L (ref 39–117)
Bilirubin, Direct: 0.1 mg/dL (ref 0.0–0.3)
Total Bilirubin: 0.5 mg/dL (ref 0.2–1.2)
Total Protein: 7.6 g/dL (ref 6.0–8.3)

## 2017-09-16 NOTE — Progress Notes (Signed)
Subjective:     Patient ID: Tanner Bishop, male   DOB: [birth year], 71 y.o.   MRN: [id number]  HPI  #IPF - uip path 03/22/13  - On OFEV since early May 2015   PFT FVC fev1 ratio BD fev1 TLC DLCO Walk test 136f x 3 laps wt rx             Spring 2014 2.9:L L/% /%        June 2015 2.7L/67% 2.34L/78% 86  3.68/57% 20.24/71%   ofev start  [name]  2016 2.5L/55% 2.1L/60% 84/108%    No desat, Pk HR 130    02/11/2015 Screening visit afferent cough study 2.59L/56% 2.24L/63% 87/112%    Dx with afluttter on ekg   Screen failed due to hx of stones  03/26/2015        No desat, PK HR 130    08/26/15 pft machine 2.73L/68% 2.38L/80% 87   [name]     06/29/2016 Office spiro 2.49L/56% 2.14L/65%     Pk HR 90 and lowest pulse ox 99% ->93%  ofev  11/02/2016  office full pft  2.61L/66% 2.3L/79%    17.23/60%   pfev  09/16/2017  2.53L/64%      Pulse ox 99% -> 93%, HR 81- > 84  ofev       OV 03/26/2015  Chief Complaint  Patient presents with  . Follow-up    [name], dry cough. CATH procedure on 04/08/15. Denies any chest tightnes/congestion.    71year old male with idiopathic pulmonary fibrosis. He is on Ofev after having failed Esbriet in the past due to side effects. He is tolerating Ofev well. Liver function test in March 2016 was normal. I last saw him in January 2016. Subsequently in February 2016 we screened him for a cough idiopathic pulmonary fibrosis study called afferent but he screen failed due to history of renal stones. At this time he was incidentally diagnosed with new onset atrial flutter. He has seen Dr. GCrissie Sicklesand has been started on anticoagulation with Xarelto and metoprolol. He is due to undergo a ablation. He is not aware of the increased bleeding risk with Ofev in the setting of anticoagulation. He assures me that the anti-coag and is only until he undergoes ablation and after that the plan is to stop it. Therefore only short-term anticoagulation with Xarelto.  Overall his effort tolerance is fine. He has postponed his [facility] lung transplant until he completes his ablation.  Walking desaturation test in the office today he did not desaturate. This is stable. Spirometry not done   Past medical history: Atrial flutter new diagnoses as above   OV 06/29/2016  Chief Complaint  Patient presents with  . Follow-up    pt states he is at baseline: sob with exertion, nonprod cough.      71year old male with idiopathic pulmonary fibrosis. He is on Ofev . He completed 6 months of PRAISE study (([id number] placebo) in l;ate winter/early spring 2017. This is [place]. Overall stable. No worsening dyspnea and cough. Continues daily exercise is walking many miles. Some days he is sitting more than the others. He is interested in more trials and results of the praise study. These results are not out yet. He is compliant with his Ofe last liver function test was in February 2017. There are no new issues. He believes his atrial fibrillation is in sinus rhythm;    OV  11/02/2016  Chief Complaint  Patient presents with  .  Follow-up    4 month IPF follow up and B&A review - does report increased prod cough with yellow mucus, wheezing, tightness in chest, increased SOB, x3-4 weeks with the weather change.  denies any f/c/s, hemoptysis, chest pain   IPF - on ofev. Last liver function test was in July 2017 and normal. Overall dyspnea stable. However he is been having diarrhea with ofev. He called in and we reduced it to 1 tablet a day and this improved the diarrhea. He back on 1 tablet twice a day. The diarrhea has returned although it is much milder. He has never taken any medication for this. Are function test shows slight improvement from July 2017 and similar levels to approximately one year ago  The new issue that he's having significant recurrent sinus infection in the back of chronic postnasal drainage. This since the fall 2017. He says that he and his wife catch  respirator infection and passive back and forth to each other. He is having cough, chest tightness, postnasal drip and yellow sputum. It is not affecting any dyspnea. There is no wheeze. There is no fever or hemoptysis.  OV 03/02/2017  Chief Complaint  Patient presents with  . Follow-up    Pt states his breathing is at baseline. Pt c/o dry cough - pt states this is baseline. Pt denies CP/tightness.     Idiopathic pulmonary fibrosis on Ofev. Last seen November 2017. He is a routine follow-up.  He is here with his wife. His sinus infections a result. He is interested in research protocols but we have none right now. He is tolerating his Ofev associated with some mild diarrhea occasional which she takes medication. He is not much of a problem. He tells me that she walks 6 times a week for 5 miles a day. On the treadmill at a 5 incline walking at 3-1/2 miles an hour. For this he feels a little more dyspneic than before. Walking desaturation test 185 feet 3 laps on room air: His pulse ox dropped from 98% at rest and 93% with exertion. His heart rate jumped from 66 at rest and 97 at peak exertion. Features are suggestive of mild progression of the disease. He is not attending pulmonary fibrosis foundation because he felt this was negative but that was years ago. We discussed this and he might be interested again.     OV 09/16/2017  Chief Complaint  Patient presents with  . Follow-up    PFT done today. Pt states that his breathing is gradually getting worse. SOB which depends if it is on exertion vs all the time and c/o prod cough with yellow mucus. Denies any CP   S IPF on fev followup. Overall doing well. He recent bronchitis and liked anabiotic and prednisone. He felt the prednisone helped his cough just currently rated as mild to moderate in severity. Helped the shortness of breath. He also helped arthralgia. He is now wondering what taking chronic prednisone. We had an extensive discussion  about this. Spirometry shows that the FVC stable compared to earlier this year but slightly progressive compared to one year ago. [facility] interstitial lung disease questionnaire shows symptoms. His wife is here with him. He is interested in research trials. He has participated in distress before. He is asking about opioid refill for cough if I wont do prednisone.      K-BILD ILD QUESTIONNAIRE, Symptom score over prior 2 weeks  7-none, 6-rarely, 5-occ, 5-some times, 3-sev times, 2-most times, 1-every time 09/16/2017  Dyspnea for stairs, incline or hill 7  Chest Tightness 7  Worry about seriousness of lung complaint 6  Avoided doing things that make you dyspneic 7  Have you felt loss of control of lung condition (reversed from original) 7  Felt fed up due to lung condition 7  Felt urge to breathe aka air hunger 5  Has lung condition made you feel anxious 7  How often have you experienced wheezing or whistling sound 6  How much of the time have you felt your lung dz is getting worse 6  How much has your lung condition interfered with job or daily task 7  Were you expecting your lung condition to get worse 6  How much has your lung function limited you carrying things like groceris 7  How much has your lung function made you think of EOL? 7  Total   Are you financially worse off [address] Total       Results for SHAHEEM, PICHON (MRN [id number]) as of 09/16/2017 09:43  Ref. Range 05/14/2014 15:46 07/24/2015 12:43 08/26/2015 10:52 11/02/2016 09:51 05/19/2017 08:41 09/16/2017 08:47  FVC-Pre Latest Units: L 2.71 2.69 2.73 2.61 2.51 2.53  FVC-%Pred-Pre Latest Units: % 67 67 68 66 63 64   Results for NKOSI, CORTRIGHT (MRN [id number]) as of 09/16/2017 09:43  Ref. Range 05/14/2014 15:46 07/24/2015 12:43 08/26/2015 10:52 11/02/2016 09:51 05/19/2017 08:41 09/16/2017 08:47  DLCO unc Latest Units: ml/min/mmHg 20.24 15.36 15.36 17.23 15.83   DLCO unc % pred Latest Units: % 71 54 54 60 55      has a past medical  history of Allergy; Arthritis; Atrial flutter ([place]); Cataract; GERD (gastroesophageal reflux disease); H/O hiatal hernia; Hyperlipidemia; Injury of right hand; Pulmonary fibrosis ([place]); Renal stone (06/2014); and Ulcer.   reports that he quit smoking about 38 years ago. His smoking use included Cigarettes. He has a 16.00 pack-year smoking history. He has never used smokeless tobacco.  Past Surgical History:  Procedure Laterality Date  . ATRIAL FLUTTER ABLATION N/A 04/08/2015   Procedure: ATRIAL FLUTTER ABLATION;  Surgeon: Evans Lance, MD;  Location: [facility] CATH LAB;  Service: Cardiovascular;  Laterality: N/A;  . CATARACT EXTRACTION, BILATERAL  2016  . COLONOSCOPY  02-24-12   adenomatous polyps removed, repeat in 5 yrs, per Dr. Deatra Ina   . ESOPHAGOGASTRODUODENOSCOPY  2007  . EXTRACORPOREAL SHOCK WAVE LITHOTRIPSY  06-17-14   per Dr. Jeffie Pollock   . HAND SURGERY Right   . LUNG BIOPSY Left 03/22/2013   Procedure: LUNG BIOPSY;  Surgeon: Melrose Nakayama, MD;  Location: [place];  Service: Thoracic;  Laterality: Left;  . POLYPECTOMY    . UPPER GASTROINTESTINAL ENDOSCOPY    . VIDEO ASSISTED THORACOSCOPY Left 03/22/2013   Procedure: VIDEO ASSISTED THORACOSCOPY;  Surgeon: Melrose Nakayama, MD;  Location: [place];  Service: Thoracic;  Laterality: Left;    No Known Allergies  Immunization History  Administered Date(s) Administered  . Influenza Split 09/13/2013  . Influenza, High Dose Seasonal PF 10/06/2016  . Influenza-Unspecified 09/07/2014, 09/14/2015  . Pneumococcal Conjugate-13 02/16/2017  . Pneumococcal Polysaccharide-23 05/26/2013  . Td 12/15/2007  . Zoster 02/19/2014    Family History  Problem Relation Age of Onset  . Liver cancer Mother   . Diabetes Sister   . Lung cancer Sister   . Heart disease Brother   . Diabetes Brother   . Colon cancer Brother   . Diabetes Unknown   . Cancer Unknown        prostate  .  Colon polyps Neg Hx   . Esophageal cancer Neg Hx   . Rectal cancer Neg Hx   .  Stomach cancer Neg Hx      Current Outpatient Prescriptions:  .  chlorpheniramine-HYDROcodone (TUSSIONEX PENNKINETIC ER) 10-8 MG/5ML SUER, Take 5 mLs by mouth 2 (two) times daily as needed for cough., Disp: 140 mL, Rfl: 0 .  diphenhydrAMINE (BENADRYL) 25 MG tablet, Take 25 mg by mouth every 6 (six) hours as needed for allergies or sleep., Disp: , Rfl:  .  fluticasone (FLONASE) 50 MCG/ACT nasal spray, PLACE 2 SPRAYS INTO BOTH NOSTRILS DAILY., Disp: 16 g, Rfl: 5 .  Nintedanib (OFEV) 150 MG CAPS, Take 1 tablet by mouth 2 (two) times daily., Disp: 60 capsule, Rfl: 5 .  pantoprazole (PROTONIX) 40 MG tablet, Take 1 tablet (40 mg total) by mouth 2 (two) times daily., Disp: 180 tablet, Rfl: 3 .  ranitidine (ZANTAC) 300 MG tablet, TAKE 1 TABLET BY MOUTH AT BEDTIME, Disp: 90 tablet, Rfl: 1 .  zolpidem (AMBIEN) 5 MG tablet, Take 1 tablet (5 mg total) by mouth at bedtime as needed for sleep., Disp: 90 tablet, Rfl: 1  Current Facility-Administered Medications:  .  0.9 %  sodium chloride infusion, 500 mL, Intravenous, Continuous, Danis, Kirke Corin, MD    Review of Systems     Objective:   Physical Exam  Constitutional: He is oriented to person, place, and time. He appears well-developed and well-nourished. No distress.  HENT:  Head: Normocephalic and atraumatic.  Right Ear: External ear normal.  Left Ear: External ear normal.  Mouth/Throat: Oropharynx is clear and moist. No oropharyngeal exudate.  Eyes: Pupils are equal, round, and reactive to light. Conjunctivae and EOM are normal. Right eye exhibits no discharge. Left eye exhibits no discharge. No scleral icterus.  Neck: Normal range of motion. Neck supple. No JVD present. No tracheal deviation present. No thyromegaly present.  Cardiovascular: Normal rate, regular rhythm and intact distal pulses.  Exam reveals no gallop and no friction rub.   No murmur heard. Pulmonary/Chest: Effort normal. No respiratory distress. He has no wheezes. He has  rales. He exhibits no tenderness.  Abdominal: Soft. Bowel sounds are normal. He exhibits no distension and no mass. There is no tenderness. There is no rebound and no guarding.  Musculoskeletal: Normal range of motion. He exhibits no edema or tenderness.  Lymphadenopathy:    He has no cervical adenopathy.  Neurological: He is alert and oriented to person, place, and time. He has normal reflexes. No cranial nerve deficit. Coordination normal.  Skin: Skin is warm and dry. No rash noted. He is not diaphoretic. No erythema. No pallor.  Psychiatric: He has a normal mood and affect. His behavior is normal. Judgment and thought content normal.  Nursing note and vitals reviewed.    Vitals:   09/16/17 0925  BP: 110/70  Pulse: 74  SpO2: 96%  Weight: 167 lb (75.8 kg)  Height: _0  (1.702 m)    Estimated body mass index is 26.16 kg/m as calculated from the following:   Height as of this encounter: _1  (1.702 m).   Weight as of this encounter: 167 lb (75.8 kg).      Assessment:       ICD-10-CM   1. IPF (idiopathic pulmonary fibrosis) (HCC) J84.112 Hepatic function panel  2. Chronic cough R05   3. Flu vaccine need Z23   4. Arthralgia, unspecified joint M25.50        Plan:  IPF slowly progressive but stable x 6 months Prednisone is indicated in flare ups for IPF otherwise associated with side effects that imapct life expectancy Understand you are having cough as an issue Glad recent bronchitis Rx helped  PLAN - For cough:   - occasionally I would use prednisone for severe cough in IPF but currently cough is mild to moderate so will recommend holding off prednisone  - - For cough: ok to refill recent  Opioid based cough syrup - For IPF:   continue ofev  - check LFT 09/16/2017  - high dose flu shot 09/16/2017   - continue daily exercise  - will refer to [employer] for research trial that will start in few months  - you might need HRCT next few months but will decide in due  course - For arthralgia  - check ESR, ANA, RF, CCP blood work  Followup  - 3 months or sooner if needed   > 50% of this > 25 min visit spent in face to face counseling or coordination of care   Dr. Brand Males, M.D., [facility].C.P Pulmonary and Critical Care Medicine Staff Physician [place] [facility] Pager: [phone], If no answer or between  15:00h - 7:00h: call [phone]  09/16/2017 10:06 AM

## 2017-09-16 NOTE — Patient Instructions (Addendum)
ICD-10-CM   1. IPF (idiopathic pulmonary fibrosis) (HCC) J84.112 Hepatic function panel  2. Chronic cough R05   3. Flu vaccine need Z23   4. Arthralgia, unspecified joint M25.50     IPF slowly progressive but stable x 6 months Prednisone is indicated in flare ups for IPF otherwise associated with side effects that imapct life expectancy Understand you are having cough as an issue Glad recent bronchitis Rx helped  PLAN - For cough:   - occasionally I would use prednisone for severe cough in IPF but currently cough is mild to moderate so will recommend holding off prednisone  - - For cough: ok to refill recent  Opioid based cough syrup - For IPF:   continue ofev  - check LFT 09/16/2017  - high dose flu shot 09/16/2017   - continue daily exercise  - will refer to [employer] for research trial that will start in few months  - you might need HRCT next few months but will decide in due course - For arthralgia  - check ESR, ANA, RF, CCP blood work  Followup  - 3 months or sooner if needed

## 2017-09-16 NOTE — Progress Notes (Signed)
Spirometry done today. 

## 2017-09-17 LAB — RHEUMATOID FACTOR: Rhuematoid fact SerPl-aCnc: <14 IU/mL (ref <14)

## 2017-09-17 LAB — CYCLIC CITRUL PEPTIDE ANTIBODY, IGG: Cyclic Citrullin Peptide Ab: <16 UNITS

## 2017-09-17 LAB — ANA: Anti Nuclear Antibody(ANA): NEGATIVE

## 2017-09-20 ENCOUNTER — Other Ambulatory Visit: Payer: Self-pay | Admitting: *Deleted

## 2017-09-20 ENCOUNTER — Telehealth: Payer: Self-pay | Admitting: Internal Medicine

## 2017-09-20 ENCOUNTER — Other Ambulatory Visit: Payer: Self-pay | Admitting: Internal Medicine

## 2017-09-20 MED ORDER — MORPHINE SULFATE 10 MG/5ML PO SOLN: 4.0000 mg | Freq: Two times a day (BID) | ORAL | 0 refills | Status: DC | PRN

## 2017-09-20 NOTE — Telephone Encounter (Signed)
Called Tanner Bishop to let him know the results of his labwork. While on the phone with Tanner Bishop, he asked me if I was able to find the Rx of the cough med MR had prescribed to him one time before. Tanner Bishop stated to me that the med was a morphine based med that was prescribed for a cough instead of for pain and was prescribed in 2014 after a procedure.  While on phone with Tanner Bishop, found the med that was prescribed for him. Printed Rx and have ready for MR to sign when he is back at the office.

## 2017-09-21 ENCOUNTER — Telehealth: Payer: Self-pay | Admitting: Internal Medicine

## 2017-09-21 NOTE — Telephone Encounter (Signed)
Called pt letting him know that MR signed the Rx and it was ready for him to pick up. Pt asked me to place Rx up front for him to come pick it up. Envelope with Rx was placed up front. Nothing further needed.

## 2017-09-21 NOTE — Telephone Encounter (Signed)
Spoke with pt, answered questions regarding lab results.  Nothing further needed.

## 2017-09-26 ENCOUNTER — Other Ambulatory Visit: Payer: Self-pay | Admitting: Internal Medicine

## 2017-10-25 ENCOUNTER — Ambulatory Visit: Payer: PPO | Admitting: Family Medicine

## 2017-10-25 ENCOUNTER — Encounter: Payer: Self-pay | Admitting: Family Medicine

## 2017-10-25 VITALS — BP 112/66 | Ht 67.0 in | Wt 171.0 lb

## 2017-10-25 DIAGNOSIS — H6022 Malignant otitis externa, left ear: Secondary | ICD-10-CM

## 2017-10-25 MED ORDER — NEOMYCIN-POLYMYXIN-HC 3.5-10000-1 OT SOLN: 4.0000 [drp] | Freq: Four times a day (QID) | OTIC | 0 refills | Status: DC

## 2017-10-25 NOTE — Patient Instructions (Signed)
WE NOW OFFER   [facility] FAST TRACK!!!  SAME DAY Appointments for ACUTE CARE  Such as: Sprains, Injuries, cuts, abrasions, rashes, muscle pain, joint pain, back pain Colds, flu, sore throats, headache, allergies, cough, fever  Ear pain, sinus and eye infections Abdominal pain, nausea, vomiting, diarrhea, upset stomach Animal/insect bites  3 Easy Ways to Schedule: Walk-In Scheduling Call in scheduling Mychart Sign-up: [email or web address]         

## 2017-10-25 NOTE — Progress Notes (Signed)
   Subjective:    Patient ID: Tanner Bishop, male    DOB: 1946-10-02, 71 y.o.   MRN: [id number]  HPI Here for 2 weeks of itching and pain in the left ear. No fever or sinus symptoms.   Review of Systems  Constitutional: Negative.   HENT: Positive for ear pain. Negative for congestion, hearing loss, sinus pressure, sinus pain and sore throat.   Eyes: Negative.   Respiratory: Negative.        Objective:   Physical Exam  Constitutional: He appears well-developed and well-nourished.  HENT:  Right Ear: External ear normal.  Nose: Nose normal.  Mouth/Throat: Oropharynx is clear and moist.  Left external canal is slightly red, the TM is clear   Eyes: Conjunctivae are normal.  Neck: Neck supple. No thyromegaly present.  Pulmonary/Chest: Effort normal and breath sounds normal. No respiratory distress. He has no wheezes. He has no rales.  Lymphadenopathy:    He has no cervical adenopathy.          Assessment & Plan:  Otitis externa, treat with Cortisporin Otic drops.  Alysia Penna, MD

## 2017-10-28 ENCOUNTER — Telehealth: Payer: Self-pay

## 2017-10-28 NOTE — Telephone Encounter (Signed)
Copied from [place] [id number]. Topic: General - Other >> Oct 28, 2017  2:06 PM Lennox Solders wrote: Pt was seen on Monday and given rx for ear drops. The ear drops give pt a little relief. Pt is still having ear pain. Please advise

## 2017-10-29 MED ORDER — AMOXICILLIN-POT CLAVULANATE 875-125 MG PO TABS: 1.0000 | ORAL_TABLET | Freq: Two times a day (BID) | ORAL | 0 refills | Status: DC

## 2017-10-29 NOTE — Telephone Encounter (Signed)
Rx e-scribed to pt's preferred pharmacy [facility]. Pt advised Rx was sent in.

## 2017-10-29 NOTE — Telephone Encounter (Signed)
I agree. Instead of more drops we will try an oral antibiotic. Call in Augmentin [phone] for 10 days

## 2017-10-29 NOTE — Telephone Encounter (Signed)
I would use the drops through the weekend. If he is not better by Monday have him come back in to re-evaluate

## 2017-10-29 NOTE — Telephone Encounter (Signed)
I spoke with pt and he does not have enough of the drops to last and thought a antibiotic was going to be sent in day of office visit? If [facility]

## 2017-11-23 ENCOUNTER — Other Ambulatory Visit: Payer: Self-pay | Admitting: Family Medicine

## 2017-11-24 NOTE — Telephone Encounter (Signed)
Sent to PCP for approval.

## 2017-11-24 NOTE — Telephone Encounter (Signed)
Call in #[phone] with 5 rf

## 2017-12-22 ENCOUNTER — Ambulatory Visit: Payer: PPO | Admitting: Internal Medicine

## 2017-12-22 ENCOUNTER — Encounter: Payer: Self-pay | Admitting: Internal Medicine

## 2017-12-22 ENCOUNTER — Other Ambulatory Visit (INDEPENDENT_AMBULATORY_CARE_PROVIDER_SITE_OTHER): Payer: PPO

## 2017-12-22 VITALS — BP 114/68 | HR 69 | Ht 67.0 in | Wt 168.0 lb

## 2017-12-22 DIAGNOSIS — Z5181 Encounter for therapeutic drug level monitoring: Secondary | ICD-10-CM | POA: Diagnosis not present

## 2017-12-22 DIAGNOSIS — J84112 Idiopathic pulmonary fibrosis: Secondary | ICD-10-CM

## 2017-12-22 LAB — HEPATIC FUNCTION PANEL
ALT: 9 U/L (ref 0–53)
AST: 19 U/L (ref 0–37)
Albumin: 4.4 g/dL (ref 3.5–5.2)
Alkaline Phosphatase: 53 U/L (ref 39–117)
Bilirubin, Direct: 0.1 mg/dL (ref 0.0–0.3)
Total Bilirubin: 0.5 mg/dL (ref 0.2–1.2)
Total Protein: 7.4 g/dL (ref 6.0–8.3)

## 2017-12-22 NOTE — Progress Notes (Signed)
Subjective:     Patient ID: Tanner Bishop, male   DOB: 1946/08/21, 72 y.o.   MRN: [id number]  HPI  #IPF - uip path 03/22/13  - On OFEV since early May 2015   PFT FVC fev1 ratio BD fev1 TLC DLCO Walk test 161f x 3 laps wt rx             Spring 2014 2.9:L L/% /%        June 2015 2.7L/67% 2.34L/78% 86  3.68/57% 20.24/71%   ofev start  [name]  2016 2.5L/55% 2.1L/60% 84/108%    No desat, Pk HR 130    02/11/2015 Screening visit afferent cough study 2.59L/56% 2.24L/63% 87/112%    Dx with afluttter on ekg   Screen failed due to hx of stones  03/26/2015        No desat, PK HR 130    08/26/15 pft machine 2.73L/68% 2.38L/80% 87   [name]     06/29/2016 Office spiro 2.49L/56% 2.14L/65%     Pk HR 90 and lowest pulse ox 99% ->93%  ofev  11/02/2016  office full pft  2.61L/66% 2.3L/79%    17.23/60%   pfev  09/16/2017  2.53L/64%      Pulse ox 99% -> 93%, HR 81- > 84  ofev  12/22/2017        100% -> 97%,  HR 66 -> 96         OV 03/26/2015  Chief Complaint  Patient presents with  . Follow-up    [name], dry cough. CATH procedure on 04/08/15. Denies any chest tightnes/congestion.    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 He is on Ofev after having failed Esbriet in the past due to side effects. He is tolerating Ofev well. Liver function test in March 2016 was normal. I last saw him in January 2016. Subsequently in February 2016 we screened him for a cough idiopathic pulmonary fibrosis study called afferent but he screen failed due to history of renal stones. At this time he was incidentally diagnosed with new onset atrial flutter. He has seen Dr. GCrissie Sicklesand has been started on anticoagulation with Xarelto and metoprolol. He is due to undergo a ablation. He is not aware of the increased bleeding risk with Ofev in the setting of anticoagulation. He assures me that the anti-coag and is only until he undergoes ablation and after that the plan is to stop it. Therefore  only short-term anticoagulation with Xarelto. Overall his effort tolerance is fine. He has postponed his [facility] lung transplant until he completes his ablation.  Walking desaturation test in the office today he did not desaturate. This is stable. Spirometry not done   Past medical history: Atrial flutter new diagnoses as above   OV 06/29/2016  Chief Complaint  Patient presents with  . Follow-up    pt states he is at baseline: sob with exertion, nonprod cough.      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 He is on Ofev . He completed 6 months of PRAISE study (([id number] placebo) in l;ate winter/early spring 2017. This is SHebervisit. Overall stable. No worsening dyspnea and cough. Continues daily exercise is walking many miles. Some days he is sitting more than the others. He is interested in more trials and results of the praise study. These results are not out yet. He is compliant with his Ofe last liver function test was in February 2017. There are no new issues. He believes his atrial  fibrillation is in sinus rhythm;    OV  11/02/2016  Chief Complaint  Patient presents with  . Follow-up    4 month IPF follow up and B&A review - does report increased prod cough with yellow mucus, wheezing, tightness in chest, increased SOB, x3-4 weeks with the weather change.  denies any f/c/s, hemoptysis, chest pain   IPF - on ofev. Last liver function test was in July 2017 and normal. Overall dyspnea stable. However he is been having diarrhea with ofev. He called in and we reduced it to 1 tablet a day and this improved the diarrhea. He back on 1 tablet twice a day. The diarrhea has returned although it is much milder. He has never taken any medication for this. Are function test shows slight improvement from July 2017 and similar levels to approximately one year ago  The new issue that he's having significant recurrent sinus infection in the back of chronic postnasal drainage. This since the fall  2017. He says that he and his wife catch respirator infection and passive back and forth to each other. He is having cough, chest tightness, postnasal drip and yellow sputum. It is not affecting any dyspnea. There is no wheeze. There is no fever or hemoptysis.  OV 03/02/2017  Chief Complaint  Patient presents with  . Follow-up    Pt states his breathing is at baseline. Pt c/o dry cough - pt states this is baseline. Pt denies CP/tightness.     Idiopathic pulmonary fibrosis on Ofev. Last seen November 2017. He is a routine follow-up.  He is here with his wife. His sinus infections a result. He is interested in research protocols but we have none right now. He is tolerating his Ofev associated with some mild diarrhea occasional which she takes medication. He is not much of a problem. He tells me that she walks 6 times a week for 5 miles a day. On the treadmill at a 5 incline walking at 3-1/2 miles an hour. For this he feels a little more dyspneic than before. Walking desaturation test 185 feet 3 laps on room air: His pulse ox dropped from 98% at rest and 93% with exertion. His heart rate jumped from 66 at rest and 97 at peak exertion. Features are suggestive of mild progression of the disease. He is not attending pulmonary fibrosis foundation because he felt this was negative but that was years ago. We discussed this and he might be interested again.     OV 09/16/2017  Chief Complaint  Patient presents with  . Follow-up    PFT done today. Pt states that his breathing is gradually getting worse. SOB which depends if it is on exertion vs all the time and c/o prod cough with yellow mucus. Denies any CP   S IPF on fev followup. Overall doing well. He recent bronchitis and liked anabiotic and prednisone. He felt the prednisone helped his cough just currently rated as mild to moderate in severity. Helped the shortness of breath. He also helped arthralgia. He is now wondering what taking chronic  prednisone. We had an extensive discussion about this. Spirometry shows that the FVC stable compared to earlier this year but slightly progressive compared to one year ago. [facility] interstitial lung disease questionnaire shows symptoms. His wife is here with him. He is interested in research trials. He has participated in distress before. He is asking about opioid refill for cough if I wont do prednisone.   OV 12/22/2017  Chief Complaint  Patient presents with  . Follow-up    Pt states he believes his breathing is at a stable point right now. States that he is coughing which is worse when he first wakes up and then also at night; occ will cough up yellow phlegm.   Fu ipf  C.o ofev intolerance with fatigue, weight loss, diarrhea. Does not like lomotil. Wnaant to give it a holiday foir 2-3 weeks. INterested in research protocol - discussed [place]. 5 year cut off since diagnosis coming up 03/22/18. He is overall frustrated ith qualtiy of life. Walks 5 miles; starts feeling dizzy > 3 miles in but not checking pulse ox despite advice. Walking desaturation test on 12/22/2017 185 feet x 3 laps on ROOM AIR:  Did  not desaturate. Rest pulse ox was 100%, final pulse ox was 97%. HR response 66/min at rest to 96/min at peak exertion. Patient Tanner Bishop  Did not Desaturate < 88% . Terrence Sather yes  Desaturated </= 3% points. Manraj Spratlin yes did get tachyardic     K-BILD ILD QUESTIONNAIRE, Symptom score over prior 2 weeks  7-none, 6-rarely, 5-occ, 5-some times, 3-sev times, 2-most times, 1-every time 09/16/2017  12/22/2017   Dyspnea for stairs, incline or hill 7 7  Chest Tightness 7 5  Worry about seriousness of lung complaint 6 6  Avoided doing things that make you dyspneic 7 7  Have you felt loss of control of lung condition (reversed from original) 7 7  Felt fed up due to lung condition 7 7  Felt urge to breathe aka air hunger 5 6  Has lung condition made you feel anxious 7 7  How often have you  experienced wheezing or whistling sound 6 6  How much of the time have you felt your lung dz is getting worse 6 7  How much has your lung condition interfered with job or daily task 7 6  Were you expecting your lung condition to get worse 6 6  How much has your lung function limited you carrying things like groceris 7 7  How much has your lung function made you think of EOL? 7 7  Total    Are you financially worse off 7 7  Grand Total        Results for COLAN, LAYMON (MRN [id number]) as of 09/16/2017 09:43  Ref. Range 05/14/2014 15:46 07/24/2015 12:43 08/26/2015 10:52 11/02/2016 09:51 05/19/2017 08:41 09/16/2017 08:47  FVC-Pre Latest Units: L 2.71 2.69 2.73 2.61 2.51 2.53  FVC-%Pred-Pre Latest Units: % 67 67 68 66 63 64   Results for BRODE, SCULLEY (MRN [id number]) as of 09/16/2017 09:43  Ref. Range 05/14/2014 15:46 07/24/2015 12:43 08/26/2015 10:52 11/02/2016 09:51 05/19/2017 08:41 09/16/2017 08:47  DLCO unc Latest Units: ml/min/mmHg 20.24 15.36 15.36 17.23 15.83   DLCO unc % pred Latest Units: % 71 54 54 60 55       has a past medical history of Allergy, Arthritis, Atrial flutter ([place]), Cataract, GERD (gastroesophageal reflux disease), H/O hiatal hernia, Hyperlipidemia, Injury of right hand, Pulmonary fibrosis ([place]), Renal stone (06/2014), and Ulcer.   reports that he quit smoking about 39 years ago. His smoking use included cigarettes. He has a 16.00 pack-year smoking history. he has never used smokeless tobacco.  Past Surgical History:  Procedure Laterality Date  . ATRIAL FLUTTER ABLATION N/A 04/08/2015   Procedure: ATRIAL FLUTTER ABLATION;  Surgeon: Evans Lance, MD;  Location: [facility] CATH LAB;  Service: Cardiovascular;  Laterality: N/A;  . CATARACT EXTRACTION, BILATERAL  2016  . COLONOSCOPY  02-24-12   adenomatous polyps removed, repeat in 5 yrs, per Dr. Deatra Ina   . ESOPHAGOGASTRODUODENOSCOPY  2007  . EXTRACORPOREAL SHOCK WAVE LITHOTRIPSY  06-17-14   per Dr. Jeffie Pollock   . HAND SURGERY Right   . LUNG  BIOPSY Left 03/22/2013   Procedure: LUNG BIOPSY;  Surgeon: Melrose Nakayama, MD;  Location: [facility];  Service: Thoracic;  Laterality: Left;  . POLYPECTOMY    . UPPER GASTROINTESTINAL ENDOSCOPY    . VIDEO ASSISTED THORACOSCOPY Left 03/22/2013   Procedure: VIDEO ASSISTED THORACOSCOPY;  Surgeon: Melrose Nakayama, MD;  Location: [facility];  Service: Thoracic;  Laterality: Left;    No Known Allergies  Immunization History  Administered Date(s) Administered  . Influenza Split 09/13/2013  . Influenza, High Dose Seasonal PF 10/06/2016, 09/16/2017  . Influenza-Unspecified 09/07/2014, 09/14/2015  . Pneumococcal Conjugate-13 02/16/2017  . Pneumococcal Polysaccharide-23 05/26/2013  . Td 12/15/2007  . Zoster 02/19/2014    Family History  Problem Relation Age of Onset  . Liver cancer Mother   . Diabetes Sister   . Lung cancer Sister   . Heart disease Brother   . Diabetes Brother   . Colon cancer Brother   . Diabetes Unknown   . Cancer Unknown        prostate  . Colon polyps Neg Hx   . Esophageal cancer Neg Hx   . Rectal cancer Neg Hx   . Stomach cancer Neg Hx      Current Outpatient Medications:  .  diphenhydrAMINE (BENADRYL) 25 MG tablet, Take 25 mg by mouth every 6 (six) hours as needed for allergies or sleep., Disp: , Rfl:  .  fluticasone (FLONASE) 50 MCG/ACT nasal spray, PLACE 2 SPRAYS INTO BOTH NOSTRILS DAILY., Disp: 16 g, Rfl: 5 .  morphine 10 MG/5ML solution, Take 2 mLs (4 mg total) by mouth 2 (two) times daily as needed., Disp: 120 mL, Rfl: 0 .  Nintedanib (OFEV) 150 MG CAPS, Take 1 tablet by mouth 2 (two) times daily., Disp: 60 capsule, Rfl: 5 .  pantoprazole (PROTONIX) 40 MG tablet, Take 1 tablet (40 mg total) by mouth 2 (two) times daily., Disp: 180 tablet, Rfl: 3 .  ranitidine (ZANTAC) 300 MG tablet, TAKE 1 TABLET BY MOUTH AT BEDTIME, Disp: 90 tablet, Rfl: 1 .  zolpidem (AMBIEN) 5 MG tablet, Take 1 tablet (5 mg total) by mouth at bedtime as needed for sleep., Disp: 90  tablet, Rfl: 1 .  chlorpheniramine-HYDROcodone (TUSSIONEX PENNKINETIC ER) 10-8 MG/5ML SUER, Take 5 mLs by mouth 2 (two) times daily as needed for cough. (Patient not taking: Reported on 10/25/2017), Disp: 140 mL, Rfl: 0  Current Facility-Administered Medications:  .  0.9 %  sodium chloride infusion, 500 mL, Intravenous, Continuous, Danis, Kirke Corin, MD   Review of Systems     Objective:   Physical Exam  Constitutional: He is oriented to person, place, and time. He appears well-developed and well-nourished. No distress.  HENT:  Head: Normocephalic and atraumatic.  Right Ear: External ear normal.  Left Ear: External ear normal.  Mouth/Throat: Oropharynx is clear and moist. No oropharyngeal exudate.  Eyes: Conjunctivae and EOM are normal. Pupils are equal, round, and reactive to light. Right eye exhibits no discharge. Left eye exhibits no discharge. No scleral icterus.  Neck: Normal range of motion. Neck supple. No JVD present. No tracheal deviation present. No thyromegaly present.  Cardiovascular: Normal rate, regular rhythm and intact distal pulses. Exam reveals no gallop and no  friction rub.  No murmur heard. Pulmonary/Chest: Effort normal. No respiratory distress. He has no wheezes. He has rales. He exhibits no tenderness.  Abdominal: Soft. Bowel sounds are normal. He exhibits no distension and no mass. There is no tenderness. There is no rebound and no guarding.  Musculoskeletal: Normal range of motion. He exhibits no edema or tenderness.  Lymphadenopathy:    He has no cervical adenopathy.  Neurological: He is alert and oriented to person, place, and time. He has normal reflexes. No cranial nerve deficit. Coordination normal.  Skin: Skin is warm and dry. No rash noted. He is not diaphoretic. No erythema. No pallor.  Psychiatric: He has a normal mood and affect. His behavior is normal. Judgment and thought content normal.  Nursing note and vitals reviewed.  Vitals:   12/22/17 1047   BP: 114/68  Pulse: 69  SpO2: 98%  Weight: 168 lb (76.2 kg)  Height: _0  (1.702 m)    Estimated body mass index is 26.31 kg/m as calculated from the following:   Height as of this encounter: _1  (1.702 m).   Weight as of this encounter: 168 lb (76.2 kg).     Assessment:       ICD-10-CM   1. IPF (idiopathic pulmonary fibrosis) ([place]) J84.112   2. Encounter for therapeutic drug monitoring Z51.81        Plan:      Respect that you are having fatigue, weight loss, diarrhea with OFev and you want to give it a 2-3 week holiday Respect your interest in research trial - [place]; last date for you to enter this trial is by end march /early April 2019  PLAN To reconcile these above Check LFT 12/22/2017  Will talk to [place] and see how long you should be on stable no drug v stable ofev for you to enroll  - stay tuned; till then contiue OFev  FOllowup 6 weeks - in ILD Clinic Tuesday PM  - KBILD and simple walk at followup    > 50% of this > 25 min visit spent in face to face counseling or coordination of care   PS: LFT normal. FOR GLPG study he needs to be on stable ofev x 8 weeks and will need to be enrolled by 03/21/18 (5 year since IPF dx). So if he stops ofev it could jeopardize study participatin esp if he restarts ofev because we will be getting close to 03/21/18 but not if he does not restart provided enrollment happens before 03/21/18. He undertsood this and stated that because ofev makes him feel so bad he wants to do a holiday for 2 weeks and call us back. I was fine with this plan     Dr. Brand Males, M.D., [facility].C.P Pulmonary and Critical Care Medicine Staff Physician, [place] Director - Interstitial Lung Disease  Program  Pulmonary [place] at [place], [place], [address]  Pager: [phone], If no answer or between  15:00h - 7:00h: call [phone] Telephone: [phone]

## 2017-12-22 NOTE — Patient Instructions (Addendum)
ICD-10-CM   1. IPF (idiopathic pulmonary fibrosis) ([place]) J84.112   2. Encounter for therapeutic drug monitoring Z51.81    Respect that you are having fatigue, weight loss, diarrhea with OFev and you want to give it a 2-3 week holiday Respect your interest in research trial - [place]; last date for you to enter this trial is by end march /early April 2019  PLAN To reconcile these above Check LFT 12/22/2017  Will talk to [place] and see how long you should be on stable no drug v stable ofev for you to enroll  - stay tuned; till then contiue OFev  FOllowup 6 weeks - in ILD Clinic Tuesday PM  - KBILD and simple walk at followup

## 2017-12-24 ENCOUNTER — Telehealth: Payer: Self-pay | Admitting: Internal Medicine

## 2017-12-24 NOTE — Telephone Encounter (Signed)
dw patient: in order to be any study eligible he has to do it before early apri 2019 becuaes of 5 year limit on diagnosis on [place] and glpg trials. And if he gives ofev a holiday 12/24/2017 for 2 weeks then decideds to go back on it he needs to be on stable dose ofev x 8 weeks which could push him into study ineligiblity territory by early April 2019. He understand this and says that his quality of lifewith ofev is bad that he wants to give holiday x 2 weeks starting 12/24/2017 even if it means study ineligibility.    Plan - told him lft normal  - ok to stop ofev x 2 weeks starting 12/24/2017 - emily please call him in 2 weeks to see if he is going to restart ofev. So your call should be 2 fridays from 12/24/2017 - he is to keep routine fu in feb 2019   Dr. Brand Males, M.D., [facility].C.P Pulmonary and Critical Care Medicine Staff Physician, [place] Director - Interstitial Lung Disease  Program  Pulmonary [place] at [place], [place], [address]  Pager: [phone], If no answer or between  15:00h - 7:00h: call [phone] Telephone: [phone]

## 2017-12-29 ENCOUNTER — Ambulatory Visit (INDEPENDENT_AMBULATORY_CARE_PROVIDER_SITE_OTHER): Payer: PPO | Admitting: Family Medicine

## 2017-12-29 ENCOUNTER — Encounter: Payer: Self-pay | Admitting: Family Medicine

## 2017-12-29 VITALS — BP 102/80 | HR 75 | Temp 96.9°F | Wt 171.6 lb

## 2017-12-29 DIAGNOSIS — M26622 Arthralgia of left temporomandibular joint: Secondary | ICD-10-CM

## 2017-12-29 MED ORDER — DICLOFENAC SODIUM 75 MG PO TBEC: 75.0000 mg | DELAYED_RELEASE_TABLET | Freq: Two times a day (BID) | ORAL | 2 refills | Status: DC

## 2017-12-29 NOTE — Progress Notes (Signed)
   Subjective:    Patient ID: Tanner Bishop, male    DOB: 1946-08-26, 72 y.o.   MRN: [id number]  HPI Here for 2 weeks of pain in the left ear. It hurts to open his mouth wide. No other URI symptoms.    Review of Systems  Constitutional: Negative.   HENT: Positive for ear pain. Negative for congestion, hearing loss, postnasal drip, sinus pressure, sinus pain and sore throat.   Eyes: Negative.   Respiratory: Negative.   Cardiovascular: Negative.        Objective:   Physical Exam  Constitutional: He appears well-developed and well-nourished.  HENT:  Right Ear: External ear normal.  Left Ear: External ear normal.  Nose: Nose normal.  Mouth/Throat: Oropharynx is clear and moist.  The left TMJ is quite tender. No crepitus and ROM is full   Eyes: Conjunctivae are normal.  Neck: No thyromegaly present.  Pulmonary/Chest: Effort normal and breath sounds normal. No respiratory distress. He has no wheezes. He has no rales.  Lymphadenopathy:    He has no cervical adenopathy.          Assessment & Plan:  TMJ pain. Try Diclofenac bid. Advised him to see his dentist some to check his bite alignment. Alysia Penna, MD

## 2018-01-10 NOTE — Telephone Encounter (Signed)
How is he feeling off ofev? And how long has been of the ofev?  Dr. Brand Males, M.D., [facility].C.P Pulmonary and Critical Care Medicine Staff Physician, [place] Director - Interstitial Lung Disease  Program  Pulmonary [place] at [place], [place], [address]  Pager: [phone], If no answer or between  15:00h - 7:00h: call [phone] Telephone: [phone]

## 2018-01-10 NOTE — Telephone Encounter (Signed)
Called and spoke with pt who states he has not yet restarted his OFEV yet and stated he was waiting to hear from [place] if he should restart the med.  MR, please advise on if you want pt to restart the OFEV based on the study trials.  Thanks!

## 2018-01-11 NOTE — Telephone Encounter (Signed)
Well is his choice to go back on ofev. He can do 1 tablet daily for 1 fpr 3 daus and then go to 1 tab twice daily.  Even with holiday and slow re- introduction of ofev there is a chance his side effects will come back but we wont know now  Dr. Brand Males, M.D., [facility].C.P Pulmonary and Critical Care Medicine Staff Physician, [place] Director - Interstitial Lung Disease  Program  Pulmonary [place] at [place], [place], [address]  Pager: [phone], If no answer or between  15:00h - 7:00h: call [phone] Telephone: [phone]

## 2018-01-11 NOTE — Telephone Encounter (Signed)
Called and spoke with pt who states he has gained his strength back and also his appetite is now back since he has been off of the [place].  Pt has gained 4 pounds back since he has been eating and he feels really good.  Pt has been off of the OFEV since 12/24/17 and is currently still off of it.

## 2018-01-11 NOTE — Telephone Encounter (Signed)
Pt is aware of below message and voiced his understanding. Nothing further is needed.

## 2018-02-01 ENCOUNTER — Ambulatory Visit: Payer: PPO | Admitting: Internal Medicine

## 2018-02-01 ENCOUNTER — Encounter: Payer: Self-pay | Admitting: Internal Medicine

## 2018-02-01 VITALS — BP 128/78 | HR 76 | Ht 67.0 in | Wt 171.6 lb

## 2018-02-01 DIAGNOSIS — Z5181 Encounter for therapeutic drug level monitoring: Secondary | ICD-10-CM | POA: Diagnosis not present

## 2018-02-01 DIAGNOSIS — J84112 Idiopathic pulmonary fibrosis: Secondary | ICD-10-CM

## 2018-02-01 NOTE — Progress Notes (Signed)
Subjective:     Patient ID: Tanner Bishop, male   DOB: 1946/08/21, 72 y.o.   MRN: [id number]  HPI  #IPF - uip path 03/22/13  - On OFEV since early May 2015   PFT FVC fev1 ratio BD fev1 TLC DLCO Walk test 161f x 3 laps wt rx             Spring 2014 2.9:L L/% /%        June 2015 2.7L/67% 2.34L/78% 86  3.68/57% 20.24/71%   ofev start  [name]  2016 2.5L/55% 2.1L/60% 84/108%    No desat, Pk HR 130    02/11/2015 Screening visit afferent cough study 2.59L/56% 2.24L/63% 87/112%    Dx with afluttter on ekg   Screen failed due to hx of stones  03/26/2015        No desat, PK HR 130    08/26/15 pft machine 2.73L/68% 2.38L/80% 87   [name]     06/29/2016 Office spiro 2.49L/56% 2.14L/65%     Pk HR 90 and lowest pulse ox 99% ->93%  ofev  11/02/2016  office full pft  2.61L/66% 2.3L/79%    17.23/60%   pfev  09/16/2017  2.53L/64%      Pulse ox 99% -> 93%, HR 81- > 84  ofev  12/22/2017        100% -> 97%,  HR 66 -> 96         OV 03/26/2015  Chief Complaint  Patient presents with  . Follow-up    [name], dry cough. CATH procedure on 04/08/15. Denies any chest tightnes/congestion.    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 He is on Ofev after having failed Esbriet in the past due to side effects. He is tolerating Ofev well. Liver function test in March 2016 was normal. I last saw him in January 2016. Subsequently in February 2016 we screened him for a cough idiopathic pulmonary fibrosis study called afferent but he screen failed due to history of renal stones. At this time he was incidentally diagnosed with new onset atrial flutter. He has seen Dr. GCrissie Sicklesand has been started on anticoagulation with Xarelto and metoprolol. He is due to undergo a ablation. He is not aware of the increased bleeding risk with Ofev in the setting of anticoagulation. He assures me that the anti-coag and is only until he undergoes ablation and after that the plan is to stop it. Therefore  only short-term anticoagulation with Xarelto. Overall his effort tolerance is fine. He has postponed his [facility] lung transplant until he completes his ablation.  Walking desaturation test in the office today he did not desaturate. This is stable. Spirometry not done   Past medical history: Atrial flutter new diagnoses as above   OV 06/29/2016  Chief Complaint  Patient presents with  . Follow-up    pt states he is at baseline: sob with exertion, nonprod cough.      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 He is on Ofev . He completed 6 months of PRAISE study (([id number] placebo) in l;ate winter/early spring 2017. This is SHebervisit. Overall stable. No worsening dyspnea and cough. Continues daily exercise is walking many miles. Some days he is sitting more than the others. He is interested in more trials and results of the praise study. These results are not out yet. He is compliant with his Ofe last liver function test was in February 2017. There are no new issues. He believes his atrial  fibrillation is in sinus rhythm;    OV  11/02/2016  Chief Complaint  Patient presents with  . Follow-up    4 month IPF follow up and B&A review - does report increased prod cough with yellow mucus, wheezing, tightness in chest, increased SOB, x3-4 weeks with the weather change.  denies any f/c/s, hemoptysis, chest pain   IPF - on ofev. Last liver function test was in July 2017 and normal. Overall dyspnea stable. However he is been having diarrhea with ofev. He called in and we reduced it to 1 tablet a day and this improved the diarrhea. He back on 1 tablet twice a day. The diarrhea has returned although it is much milder. He has never taken any medication for this. Are function test shows slight improvement from July 2017 and similar levels to approximately one year ago  The new issue that he's having significant recurrent sinus infection in the back of chronic postnasal drainage. This since the fall  2017. He says that he and his wife catch respirator infection and passive back and forth to each other. He is having cough, chest tightness, postnasal drip and yellow sputum. It is not affecting any dyspnea. There is no wheeze. There is no fever or hemoptysis.  OV 03/02/2017  Chief Complaint  Patient presents with  . Follow-up    Pt states his breathing is at baseline. Pt c/o dry cough - pt states this is baseline. Pt denies CP/tightness.     Idiopathic pulmonary fibrosis on Ofev. Last seen November 2017. He is a routine follow-up.  He is here with his wife. His sinus infections a result. He is interested in research protocols but we have none right now. He is tolerating his Ofev associated with some mild diarrhea occasional which she takes medication. He is not much of a problem. He tells me that she walks 6 times a week for 5 miles a day. On the treadmill at a 5 incline walking at 3-1/2 miles an hour. For this he feels a little more dyspneic than before. Walking desaturation test 185 feet 3 laps on room air: His pulse ox dropped from 98% at rest and 93% with exertion. His heart rate jumped from 66 at rest and 97 at peak exertion. Features are suggestive of mild progression of the disease. He is not attending pulmonary fibrosis foundation because he felt this was negative but that was years ago. We discussed this and he might be interested again.     OV 09/16/2017  Chief Complaint  Patient presents with  . Follow-up    PFT done today. Pt states that his breathing is gradually getting worse. SOB which depends if it is on exertion vs all the time and c/o prod cough with yellow mucus. Denies any CP   S IPF on fev followup. Overall doing well. He recent bronchitis and liked anabiotic and prednisone. He felt the prednisone helped his cough just currently rated as mild to moderate in severity. Helped the shortness of breath. He also helped arthralgia. He is now wondering what taking chronic  prednisone. We had an extensive discussion about this. Spirometry shows that the FVC stable compared to earlier this year but slightly progressive compared to one year ago. [facility] interstitial lung disease questionnaire shows symptoms. His wife is here with him. He is interested in research trials. He has participated in distress before. He is asking about opioid refill for cough if I wont do prednisone.   OV 12/22/2017  Chief Complaint  Patient presents with  . Follow-up    Pt states he believes his breathing is at a stable point right now. States that he is coughing which is worse when he first wakes up and then also at night; occ will cough up yellow phlegm.     C.o ofev intolerance with fatigue, weight loss, diarrhea. Does not like lomotil. Wnaant to give it a holiday foir 2-3 weeks. INterested in research protocol - discussed [place]. 5 year cut off since diagnosis coming up 03/22/18. He is overall frustrated ith qualtiy of life. Walks 5 miles; starts feeling dizzy > 3 miles in but not checking pulse ox despite advice. Walking desaturation test on 12/22/2017 185 feet x 3 laps on ROOM AIR:  did noit desaturate. Rest pulse ox was 100%, final pulse ox was 97%. HR response 66/min at rest to 96/min at peak exertion. Patient Tanner Bishop  Did not Desaturate < 88% . Tanner Bishop yes  Desaturated </= 3% points. Kimmy Cobaugh yes did get tachyardic    OV 02/01/2018  Chief Complaint  Patient presents with  . Follow-up    Pt states he has been doing good since last visit. Started back on OFEV 12/24/17 after taking a holiday off of it.     FU IPF  Took ofev holiday. And then restarted 01/24/18 and doing well. [place] ofev ok. KBILD ok.   K-BILD ILD QUESTIONNAIRE, Symptom score over prior 2 weeks  7-none, 6-rarely, 5-occ, 5-some times, 3-sev times, 2-most times, 1-every time 09/16/2017  12/22/2017  02/01/2018   Dyspnea for stairs, incline or hill _0 Chest Tightness _1 Worry about  seriousness of lung complaint _2 Avoided doing things that make you dyspneic _3 Have you felt loss of control of lung condition (reversed from original) _4 Felt fed up due to lung condition _5 Felt urge to breathe aka air hunger _6 Has lung condition made you feel anxious _7 How often have you experienced wheezing or whistling sound _8 How much of the time have you felt your lung dz is getting worse _9 How much has your lung condition interfered with job or daily task _10 Were you expecting your lung condition to get worse _11 How much has your lung function limited you carrying things like groceris _12 How much has your lung function made you think of EOL? _13 Total     Are you financially worse off _14 Grand Total         Walking desaturation test on 02/01/2018 185 feet x 3 laps on ROOM AIR:  did not desaturate. Rest pulse ox was 100%, final pulse ox was 91%. HR response 67/min at rest to 85/min at peak exertion. Patient Tanner Bishop  dod not Desaturate < 88% . Tanner Bishop yes diod  Desaturated </= 3% points. Tanner Bishop did not get tachyardic   Results for TREVELLE, MCGURN (MRN [id number]) as of 09/16/2017 09:43  Ref. Range 05/14/2014 15:46 07/24/2015 12:43 08/26/2015 10:52 11/02/2016 09:51 05/19/2017 08:41 09/16/2017 08:47  FVC-Pre Latest Units: L 2.71 2.69 2.73 2.61 2.51 2.53  FVC-%Pred-Pre Latest Units: % 67 67 68 66 63 64   Results for OLUWASEMILORE, BAHL (MRN [id number]) as of 09/16/2017 09:43  Ref. Range 05/14/2014 15:46 07/24/2015 12:43 08/26/2015 10:52 11/02/2016  09:51 05/19/2017 08:41 09/16/2017 08:47  DLCO unc Latest Units: ml/min/mmHg 20.24 15.36 15.36 17.23 15.83   DLCO unc % pred Latest Units: % 71 54 54 60 55        has a past medical history of Allergy, Arthritis, Atrial flutter ([place]), Cataract, GERD (gastroesophageal reflux disease), H/O hiatal hernia, Hyperlipidemia, Injury of right hand, Pulmonary fibrosis ([place]), Renal stone (06/2014),  and Ulcer.   reports that he quit smoking about 39 years ago. His smoking use included cigarettes. He has a 16.00 pack-year smoking history. he has never used smokeless tobacco.  Past Surgical History:  Procedure Laterality Date  . ATRIAL FLUTTER ABLATION N/A 04/08/2015   Procedure: ATRIAL FLUTTER ABLATION;  Surgeon: Evans Lance, MD;  Location: [facility] CATH LAB;  Service: Cardiovascular;  Laterality: N/A;  . CATARACT EXTRACTION, BILATERAL  2016  . COLONOSCOPY  02-24-12   adenomatous polyps removed, repeat in 5 yrs, per Dr. Deatra Ina   . ESOPHAGOGASTRODUODENOSCOPY  2007  . EXTRACORPOREAL SHOCK WAVE LITHOTRIPSY  06-17-14   per Dr. Jeffie Pollock   . HAND SURGERY Right   . LUNG BIOPSY Left 03/22/2013   Procedure: LUNG BIOPSY;  Surgeon: Melrose Nakayama, MD;  Location: [facility];  Service: Thoracic;  Laterality: Left;  . POLYPECTOMY    . UPPER GASTROINTESTINAL ENDOSCOPY    . VIDEO ASSISTED THORACOSCOPY Left 03/22/2013   Procedure: VIDEO ASSISTED THORACOSCOPY;  Surgeon: Melrose Nakayama, MD;  Location: [place];  Service: Thoracic;  Laterality: Left;    No Known Allergies  Immunization History  Administered Date(s) Administered  . Influenza Split 09/13/2013  . Influenza, High Dose Seasonal PF 10/06/2016, 09/16/2017  . Influenza-Unspecified 09/07/2014, 09/14/2015  . Pneumococcal Conjugate-13 02/16/2017  . Pneumococcal Polysaccharide-23 05/26/2013  . Td 12/15/2007  . Zoster 02/19/2014    Family History  Problem Relation Age of Onset  . Liver cancer Mother   . Diabetes Sister   . Lung cancer Sister   . Heart disease Brother   . Diabetes Brother   . Colon cancer Brother   . Diabetes Unknown   . Cancer Unknown        prostate  . Colon polyps Neg Hx   . Esophageal cancer Neg Hx   . Rectal cancer Neg Hx   . Stomach cancer Neg Hx      Current Outpatient Medications:  .  diclofenac (VOLTAREN) 75 MG EC tablet, Take 1 tablet (75 mg total) by mouth 2 (two) times daily., Disp: 60 tablet, Rfl: 2 .   diphenhydrAMINE (BENADRYL) 25 MG tablet, Take 25 mg by mouth every 6 (six) hours as needed for allergies or sleep., Disp: , Rfl:  .  fluticasone (FLONASE) 50 MCG/ACT nasal spray, PLACE 2 SPRAYS INTO BOTH NOSTRILS DAILY., Disp: 16 g, Rfl: 5 .  Nintedanib (OFEV) 150 MG CAPS, Take 1 tablet by mouth 2 (two) times daily., Disp: 60 capsule, Rfl: 5 .  pantoprazole (PROTONIX) 40 MG tablet, Take 1 tablet (40 mg total) by mouth 2 (two) times daily., Disp: 180 tablet, Rfl: 3 .  ranitidine (ZANTAC) 300 MG tablet, TAKE 1 TABLET BY MOUTH AT BEDTIME, Disp: 90 tablet, Rfl: 1 .  chlorpheniramine-HYDROcodone (TUSSIONEX PENNKINETIC ER) 10-8 MG/5ML SUER, Take 5 mLs by mouth 2 (two) times daily as needed for cough. (Patient not taking: Reported on 12/29/2017), Disp: 140 mL, Rfl: 0 .  morphine 10 MG/5ML solution, Take 2 mLs (4 mg total) by mouth 2 (two) times daily as needed. (Patient not taking: Reported on 12/29/2017), Disp: 120  mL, Rfl: 0 .  zolpidem (AMBIEN) 5 MG tablet, Take 1 tablet (5 mg total) by mouth at bedtime as needed for sleep. (Patient not taking: Reported on 02/01/2018), Disp: 90 tablet, Rfl: 1  Current Facility-Administered Medications:  .  0.9 %  sodium chloride infusion, 500 mL, Intravenous, Continuous, Danis, Kirke Corin, MD   Review of Systems     Objective:   Physical Exam  Constitutional: He is oriented to person, place, and time. He appears well-developed and well-nourished. No distress.  HENT:  Head: Normocephalic and atraumatic.  Right Ear: External ear normal.  Left Ear: External ear normal.  Mouth/Throat: Oropharynx is clear and moist. No oropharyngeal exudate.  Eyes: Conjunctivae and EOM are normal. Pupils are equal, round, and reactive to light. Right eye exhibits no discharge. Left eye exhibits no discharge. No scleral icterus.  Neck: Normal range of motion. Neck supple. No JVD present. No tracheal deviation present. No thyromegaly present.  Cardiovascular: Normal rate, regular rhythm  and intact distal pulses. Exam reveals no gallop and no friction rub.  No murmur heard. Pulmonary/Chest: Effort normal. No respiratory distress. He has no wheezes. He has rales. He exhibits no tenderness.  Abdominal: Soft. Bowel sounds are normal. He exhibits no distension and no mass. There is no tenderness. There is no rebound and no guarding.  Musculoskeletal: Normal range of motion. He exhibits no edema or tenderness.  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  Neurological: He is alert and oriented to person, place, and time. He has normal reflexes. No cranial nerve deficit. Coordination normal.  Skin: Skin is warm and dry. No rash noted. He is not diaphoretic. No erythema. No pallor.  Psychiatric: He has a normal mood and affect. His behavior is normal. Judgment and thought content normal.  Nursing note and vitals reviewed.  Vitals:   02/01/18 1330  BP: 128/78  Pulse: 76  SpO2: 99%  Weight: 171 lb 9.6 oz (77.8 kg)  Height: _0  (1.702 m)    Estimated body mass index is 26.88 kg/m as calculated from the following:   Height as of this encounter: _1  (1.702 m).   Weight as of this encounter: 171 lb 9.6 oz (77.8 kg).      Assessment:       ICD-10-CM   1. IPF (idiopathic pulmonary fibrosis) ([place]) J84.112   2. Encounter for therapeutic drug monitoring Z51.81        Plan:      Glad you are better after ofev holiday Glad you are tolerating ofev well after restart on [name] 24, 2018 Your 5 year anniversary of IPF diagnosis is 03/22/18 Tuesday You might qualify for [facility] study for IPF if you get randomized by 03/21/18 (8 weeks of stable ofev) Sorry about the loss of your nephew from cancer and clot  PLAN - take copy of [place] IPF study consent  - continue ofev and daily exercise  Followup - 6 weeks do LFT check - 6 weeks do Pre-bd spiro and dlco only. No lung volume or bd response. No post-bd spiro - return to see DR  Chase Caller in IPF clinic in 6 weeks  - if we need to cancel above due to research we will but we can decide this later   (> 50% of this 15 min visit spent in face to face counseling or/and coordination of care)   Dr. Brand Males, M.D., [facility].C.P Pulmonary and Critical Care Medicine Staff Physician,  [place] Director - Interstitial Lung Disease  Program  Pulmonary [place] at [place], [place], [address]  Pager: [phone], If no answer or between  15:00h - 7:00h: call [phone] Telephone: [phone]

## 2018-02-01 NOTE — Patient Instructions (Addendum)
ICD-10-CM   1. IPF (idiopathic pulmonary fibrosis) ([place]) J84.112   2. Encounter for therapeutic drug monitoring Z51.81    Glad you are better after ofev holiday Glad you are tolerating ofev well after restart on [name] 24, 2018 Your 5 year anniversary of IPF diagnosis is 03/22/18 Tuesday You might qualify for [facility] study for IPF if you get randomized by 03/21/18 (8 weeks of stable ofev) Sorry about the loss of your nephew from cancer and clot  PLAN - take copy of [place] IPF study consent  - continue ofev and daily exercise  Followup - 6 weeks do LFT check - 6 weeks do Pre-bd spiro and dlco only. No lung volume or bd response. No post-bd spiro - return to see DR Chase Caller in IPF clinic in 6 weeks  - if we need to cancel above due to research we will but we can decide this later

## 2018-02-02 ENCOUNTER — Telehealth: Payer: Self-pay | Admitting: Internal Medicine

## 2018-02-02 MED ORDER — NINTEDANIB ESYLATE 150 MG PO CAPS: 150.0000 mg | ORAL_CAPSULE | Freq: Two times a day (BID) | ORAL | 12 refills | Status: DC

## 2018-02-02 NOTE — Telephone Encounter (Signed)
Pt was seen at office for a follow-up visit with [name].  At that visit, pt stated he was needing a new Rx of [place]EV [place]  [place] spoke with BErlene Quanand gave a verbal refill of pt's OFEV.  Nothing further needed at this current time.

## 2018-02-03 ENCOUNTER — Other Ambulatory Visit: Payer: Self-pay | Admitting: *Deleted

## 2018-02-03 MED ORDER — FLUTICASONE PROPIONATE 50 MCG/ACT NA SUSP: 2.0000 | Freq: Every day | NASAL | 5 refills | Status: DC

## 2018-02-11 ENCOUNTER — Telehealth: Payer: Self-pay

## 2018-02-11 NOTE — Telephone Encounter (Signed)
Call to Tanner Bishop and Left VM to consider AWV prior to seeing Dr. Sarajane Jews on 3/13 or after. Gave him my private and confidential line to call back Scheduled blocked for 8am and 10am

## 2018-02-23 ENCOUNTER — Encounter: Payer: Self-pay | Admitting: Family Medicine

## 2018-02-23 ENCOUNTER — Ambulatory Visit (INDEPENDENT_AMBULATORY_CARE_PROVIDER_SITE_OTHER): Payer: PPO

## 2018-02-23 ENCOUNTER — Ambulatory Visit (INDEPENDENT_AMBULATORY_CARE_PROVIDER_SITE_OTHER): Payer: PPO | Admitting: Family Medicine

## 2018-02-23 ENCOUNTER — Ambulatory Visit: Payer: PPO

## 2018-02-23 VITALS — BP 120/70 | HR 58 | Ht 68.0 in | Wt 166.4 lb

## 2018-02-23 VITALS — BP 124/80 | HR 59 | Temp 97.6°F | Ht 68.0 in | Wt 166.7 lb

## 2018-02-23 DIAGNOSIS — Z0000 Encounter for general adult medical examination without abnormal findings: Secondary | ICD-10-CM | POA: Diagnosis not present

## 2018-02-23 DIAGNOSIS — K219 Gastro-esophageal reflux disease without esophagitis: Secondary | ICD-10-CM | POA: Diagnosis not present

## 2018-02-23 DIAGNOSIS — J84112 Idiopathic pulmonary fibrosis: Secondary | ICD-10-CM

## 2018-02-23 DIAGNOSIS — Z23 Encounter for immunization: Secondary | ICD-10-CM

## 2018-02-23 DIAGNOSIS — E782 Mixed hyperlipidemia: Secondary | ICD-10-CM

## 2018-02-23 DIAGNOSIS — N138 Other obstructive and reflux uropathy: Secondary | ICD-10-CM

## 2018-02-23 DIAGNOSIS — N401 Enlarged prostate with lower urinary tract symptoms: Secondary | ICD-10-CM | POA: Diagnosis not present

## 2018-02-23 DIAGNOSIS — H9202 Otalgia, left ear: Secondary | ICD-10-CM

## 2018-02-23 DIAGNOSIS — I4892 Unspecified atrial flutter: Secondary | ICD-10-CM

## 2018-02-23 LAB — BASIC METABOLIC PANEL
BUN: 15 mg/dL (ref 6–23)
CO2: 32 mEq/L (ref 19–32)
Calcium: 9.3 mg/dL (ref 8.4–10.5)
Chloride: 101 mEq/L (ref 96–112)
Creatinine, Ser: 0.96 mg/dL (ref 0.40–1.50)
GFR: 82.13 mL/min (ref >60.00)
Glucose, Bld: 93 mg/dL (ref 70–99)
Potassium: 4.4 mEq/L (ref 3.5–5.1)
Sodium: 138 mEq/L (ref 135–145)

## 2018-02-23 LAB — CBC WITH DIFFERENTIAL/PLATELET
Basophils Absolute: 0 10*3/uL (ref 0.0–0.1)
Basophils Relative: 0.6 % (ref 0.0–3.0)
Eosinophils Absolute: 0.2 10*3/uL (ref 0.0–0.7)
Eosinophils Relative: 2.6 % (ref 0.0–5.0)
HCT: 43.1 % (ref 39.0–52.0)
Hemoglobin: 14.5 g/dL (ref 13.0–17.0)
Lymphocytes Relative: 31.8 % (ref 12.0–46.0)
Lymphs Abs: 2.2 10*3/uL (ref 0.7–4.0)
MCHC: 33.7 g/dL (ref 30.0–36.0)
MCV: 95.8 fl (ref 78.0–100.0)
Monocytes Absolute: 0.7 10*3/uL (ref 0.1–1.0)
Monocytes Relative: 10.7 % (ref 3.0–12.0)
Neutro Abs: 3.8 10*3/uL (ref 1.4–7.7)
Neutrophils Relative %: 54.3 % (ref 43.0–77.0)
Platelets: 282 10*3/uL (ref 150.0–400.0)
RBC: 4.5 Mil/uL (ref 4.22–5.81)
RDW: 13.8 % (ref 11.5–15.5)
WBC: 6.9 10*3/uL (ref 4.0–10.5)

## 2018-02-23 LAB — HEPATIC FUNCTION PANEL
ALT: 10 U/L (ref 0–53)
AST: 16 U/L (ref 0–37)
Albumin: 4.1 g/dL (ref 3.5–5.2)
Alkaline Phosphatase: 52 U/L (ref 39–117)
Bilirubin, Direct: 0.1 mg/dL (ref 0.0–0.3)
Total Bilirubin: 0.5 mg/dL (ref 0.2–1.2)
Total Protein: 6.9 g/dL (ref 6.0–8.3)

## 2018-02-23 LAB — TSH: TSH: 2.69 u[IU]/mL (ref 0.35–4.50)

## 2018-02-23 LAB — POC URINALSYSI DIPSTICK (AUTOMATED)
Bilirubin, UA: NEGATIVE
Blood, UA: NEGATIVE
Glucose, UA: NEGATIVE
Ketones, UA: NEGATIVE
Leukocytes, UA: NEGATIVE
Nitrite, UA: NEGATIVE
Protein, UA: NEGATIVE
Spec Grav, UA: >=1.03 — AB (ref 1.010–1.025)
Urobilinogen, UA: 0.2 E.U./dL
pH, UA: 6 (ref 5.0–8.0)

## 2018-02-23 LAB — LIPID PANEL
Cholesterol: 208 mg/dL — ABNORMAL HIGH (ref 0–200)
HDL: 59 mg/dL (ref >39.00)
LDL Cholesterol: 132 mg/dL — ABNORMAL HIGH (ref 0–99)
NonHDL: 149.28
Total CHOL/HDL Ratio: 4
Triglycerides: 84 mg/dL (ref 0.0–149.0)
VLDL: 16.8 mg/dL (ref 0.0–40.0)

## 2018-02-23 LAB — PSA: PSA: 1.31 ng/mL (ref 0.10–4.00)

## 2018-02-23 MED ORDER — GABAPENTIN 100 MG PO CAPS: 100.0000 mg | ORAL_CAPSULE | Freq: Three times a day (TID) | ORAL | 5 refills | Status: DC

## 2018-02-23 MED ORDER — PANTOPRAZOLE SODIUM 40 MG PO TBEC: 40.0000 mg | DELAYED_RELEASE_TABLET | Freq: Two times a day (BID) | ORAL | 3 refills | Status: DC

## 2018-02-23 MED ORDER — ZOLPIDEM TARTRATE 5 MG PO TABS: 5.0000 mg | ORAL_TABLET | Freq: Every evening | ORAL | 5 refills | Status: DC | PRN

## 2018-02-23 NOTE — Patient Instructions (Addendum)
Tanner Bishop , Thank you for taking time to come for your Medicare Wellness Visit. I appreciate your ongoing commitment to your health goals. Please review the following plan we discussed and let me know if I can assist you in the future.   Medicare now request all "baby boomers" test for possible exposure to Hepatitis C. Many may have been exposed due to dental work, tatoo's, vaccinations when young. The Hepatitis C virus is dormant for many years and then sometimes will cause liver cancer. If you gave blood in the past 15 years, you were most likely checked for Hep C. If you rec'd blood; you may want to consider testing or if you are high risk for any other reason.  Let us know if you decide to have this drawn.   A Tetanus is recommended every 10 years. Medicare covers a tetanus if you have a cut or wound; otherwise, there may be a charge. If you had not had a tetanus with pertusses, known as the Tdap, you can take this anytime. ' Holding for now.   YOu had the zoster in 2015 Shingrix is a vaccine for the prevention of Shingles in Adults 50 and older.  If you are on Medicare, you can request a prescription from your doctor to be filled at a pharmacy.  Please check with your benefits regarding applicable copays or out of pocket expenses.  The Shingrix is given in 2 vaccines approx 8 weeks apart. You must receive the 2nd dose prior to 6 months from receipt of the first.   Instructions: To schedule AWV for next year when you see [name]!  Thanks for coming in early!   These are the goals we discussed: Goals    . Patient Stated     Would like to travel some; [place];  To maintain exercise and current lifestyle        This is a list of the screening recommended for you and due dates:  Health Maintenance  Topic Date Due  .  Hepatitis C: One time screening is recommended by Center for Disease Control  (CDC) for  adults born from [birth year] through 1964.   1946-11-09  . Tetanus Vaccine   12/14/2017  . Colon Cancer Screening  03/01/2022  . Flu Shot  Completed  . Pneumonia vaccines  Completed   Prevention of falls: Remove rugs or any tripping hazards in the home Use Non slip mats in bathtubs and showers Placing grab bars next to the toilet and or shower Placing handrails on both sides of the stair way Adding extra lighting in the home.   Personal safety issues reviewed:  1. Consider starting a community watch program per [facility] 2.  Changes batteries is smoke detector and/or carbon monoxide detector  3.  If you have firearms; keep them in a safe place 4.  Wear protection when in the sun; Always wear sunscreen or a hat; It is good to have your doctor check your skin annually or review any new areas of concern 5. Driving safety; Keep in the right lane; stay 3 car lengths behind the car in front of you on the highway; look 3 times prior to pulling out; carry your cell phone everywhere you go!      Fall Prevention in the Home Falls can cause injuries. They can happen to people of all ages. There are many things you can do to make your home safe and to help prevent falls. What can I do on  the outside of my home?  Regularly fix the edges of walkways and driveways and fix any cracks.  Remove anything that might make you trip as you walk through a door, such as a raised step or threshold.  Trim any bushes or trees on the path to your home.  Use bright outdoor lighting.  Clear any walking paths of anything that might make someone trip, such as rocks or tools.  Regularly check to see if handrails are loose or broken. Make sure that both sides of any steps have handrails.  Any raised decks and porches should have guardrails on the edges.  Have any leaves, snow, or ice cleared regularly.  Use sand or salt on walking paths during winter.  Clean up any spills in your garage right away. This includes oil or grease spills. What can I do in the  bathroom?  Use night lights.  Install grab bars by the toilet and in the tub and shower. Do not use towel bars as grab bars.  Use non-skid mats or decals in the tub or shower.  If you need to sit down in the shower, use a plastic, non-slip stool.  Keep the floor dry. Clean up any water that spills on the floor as soon as it happens.  Remove soap buildup in the tub or shower regularly.  Attach bath mats securely with double-sided non-slip rug tape.  Do not have throw rugs and other things on the floor that can make you trip. What can I do in the bedroom?  Use night lights.  Make sure that you have a light by your bed that is easy to reach.  Do not use any sheets or blankets that are too big for your bed. They should not hang down onto the floor.  Have a firm chair that has side arms. You can use this for support while you get dressed.  Do not have throw rugs and other things on the floor that can make you trip. What can I do in the kitchen?  Clean up any spills right away.  Avoid walking on wet floors.  Keep items that you use a lot in easy-to-reach places.  If you need to reach something above you, use a strong step stool that has a grab bar.  Keep electrical cords out of the way.  Do not use floor polish or wax that makes floors slippery. If you must use wax, use non-skid floor wax.  Do not have throw rugs and other things on the floor that can make you trip. What can I do with my stairs?  Do not leave any items on the stairs.  Make sure that there are handrails on both sides of the stairs and use them. Fix handrails that are broken or loose. Make sure that handrails are as long as the stairways.  Check any carpeting to make sure that it is firmly attached to the stairs. Fix any carpet that is loose or worn.  Avoid having throw rugs at the top or bottom of the stairs. If you do have throw rugs, attach them to the floor with carpet tape.  Make sure that you have a  light switch at the top of the stairs and the bottom of the stairs. If you do not have them, ask someone to add them for you. What else can I do to help prevent falls?  Wear shoes that: ? Do not have high heels. ? Have rubber bottoms. ? Are comfortable and fit  you well. ? Are closed at the toe. Do not wear sandals.  If you use a stepladder: ? Make sure that it is fully opened. Do not climb a closed stepladder. ? Make sure that both sides of the stepladder are locked into place. ? Ask someone to hold it for you, if possible.  Clearly mark and make sure that you can see: ? Any grab bars or handrails. ? First and last steps. ? Where the edge of each step is.  Use tools that help you move around (mobility aids) if they are needed. These include: ? Canes. ? Walkers. ? Scooters. ? Crutches.  Turn on the lights when you go into a dark area. Replace any light bulbs as soon as they burn out.  Set up your furniture so you have a clear path. Avoid moving your furniture around.  If any of your floors are uneven, fix them.  If there are any pets around you, be aware of where they are.  Review your medicines with your doctor. Some medicines can make you feel dizzy. This can increase your chance of falling. Ask your doctor what other things that you can do to help prevent falls. This information is not intended to replace advice given to you by your health care provider. Make sure you discuss any questions you have with your health care provider. Document Released: 09/26/2009 Document Revised: 05/07/2016 Document Reviewed: 01/04/2015 Elsevier Interactive Patient Education  2018 [place] Maintenance, Male A healthy lifestyle and preventive care is important for your health and wellness. Ask your health care provider about what schedule of regular examinations is right for you. What should I know about weight and diet? Eat a Healthy Diet  Eat plenty of vegetables, fruits,  whole grains, low-fat dairy products, and lean protein.  Do not eat a lot of foods high in solid fats, added sugars, or salt.  Maintain a Healthy Weight Regular exercise can help you achieve or maintain a healthy weight. You should:  Do at least 150 minutes of exercise each week. The exercise should increase your heart rate and make you sweat (moderate-intensity exercise).  Do strength-training exercises at least twice a week.  Watch Your Levels of Cholesterol and Blood Lipids  Have your blood tested for lipids and cholesterol every 5 years starting at 71 years of age. If you are at high risk for heart disease, you should start having your blood tested when you are 71 years old. You may need to have your cholesterol levels checked more often if: ? Your lipid or cholesterol levels are high. ? You are older than 71 years of age. ? You are at high risk for heart disease.  What should I know about cancer screening? Many types of cancers can be detected early and may often be prevented. Lung Cancer  You should be screened every year for lung cancer if: ? You are a current smoker who has smoked for at least 30 years. ? You are a former smoker who has quit within the past 15 years.  Talk to your health care provider about your screening options, when you should start screening, and how often you should be screened.  Colorectal Cancer  Routine colorectal cancer screening usually begins at 71 years of age and should be repeated every 5-10 years until you are 71 years old. You may need to be screened more often if early forms of precancerous polyps or small growths are found. Your health care provider  may recommend screening at an earlier age if you have risk factors for colon cancer.  Your health care provider may recommend using home test kits to check for hidden blood in the stool.  A small camera at the end of a tube can be used to examine your colon (sigmoidoscopy or colonoscopy). This  checks for the earliest forms of colorectal cancer.  Prostate and Testicular Cancer  Depending on your age and overall health, your health care provider may do certain tests to screen for prostate and testicular cancer.  Talk to your health care provider about any symptoms or concerns you have about testicular or prostate cancer.  Skin Cancer  Check your skin from head to toe regularly.  Tell your health care provider about any new moles or changes in moles, especially if: ? There is a change in a mole's size, shape, or color. ? You have a mole that is larger than a pencil eraser.  Always use sunscreen. Apply sunscreen liberally and repeat throughout the day.  Protect yourself by wearing long sleeves, pants, a wide-brimmed hat, and sunglasses when outside.  What should I know about heart disease, diabetes, and high blood pressure?  If you are 44-6 years of age, have your blood pressure checked every 3-5 years. If you are 61 years of age or older, have your blood pressure checked every year. You should have your blood pressure measured twice-once when you are at a hospital or clinic, and once when you are not at a hospital or clinic. Record the average of the two measurements. To check your blood pressure when you are not at a hospital or clinic, you can use: ? An automated blood pressure machine at a pharmacy. ? A home blood pressure monitor.  Talk to your health care provider about your target blood pressure.  If you are between 4-32 years old, ask your health care provider if you should take aspirin to prevent heart disease.  Have regular diabetes screenings by checking your fasting blood sugar level. ? If you are at a normal weight and have a low risk for diabetes, have this test once every three years after the age of 4. ? If you are overweight and have a high risk for diabetes, consider being tested at a younger age or more often.  A one-time screening for abdominal aortic  aneurysm (AAA) by ultrasound is recommended for men aged 38-75 years who are current or former smokers. What should I know about preventing infection? Hepatitis B If you have a higher risk for hepatitis B, you should be screened for this virus. Talk with your health care provider to find out if you are at risk for hepatitis B infection. Hepatitis C Blood testing is recommended for:  Everyone born from [birth year] through 1964.  Anyone with known risk factors for hepatitis C.  Sexually Transmitted Diseases (STDs)  You should be screened each year for STDs including gonorrhea and chlamydia if: ? You are sexually active and are younger than 71 years of age. ? You are older than 71 years of age and your health care provider tells you that you are at risk for this type of infection. ? Your sexual activity has changed since you were last screened and you are at an increased risk for chlamydia or gonorrhea. Ask your health care provider if you are at risk.  Talk with your health care provider about whether you are at high risk of being infected with HIV. Your health care provider  may recommend a prescription medicine to help prevent HIV infection.  What else can I do?  Schedule regular health, dental, and eye exams.  Stay current with your vaccines (immunizations).  Do not use any tobacco products, such as cigarettes, chewing tobacco, and e-cigarettes. If you need help quitting, ask your health care provider.  Limit alcohol intake to no more than 2 drinks per day. One drink equals 12 ounces of beer, 5 ounces of wine, or 1 ounces of hard liquor.  Do not use street drugs.  Do not share needles.  Ask your health care provider for help if you need support or information about quitting drugs.  Tell your health care provider if you often feel depressed.  Tell your health care provider if you have ever been abused or do not feel safe at home. This information is not intended to replace advice  given to you by your health care provider. Make sure you discuss any questions you have with your health care provider. Document Released: 05/28/2008 Document Revised: 07/29/2016 Document Reviewed: 09/03/2015 Elsevier Interactive Patient Education  [employer].

## 2018-02-23 NOTE — Progress Notes (Signed)
   Subjective:    Patient ID: Tanner Bishop, male    DOB: [birth year], 72 y.o.   MRN: [id number]  HPI Here to follow up on issues. He feels well in general, but he is still troubled by frequent pain in the left ear. We saw him in January and his left TMJ was tender on exam. He has tried Diclofenac with little relief. He says the pain comes and goes but it present more days than not. He recently saw his dentist and was told his bite was in good alignment. He was taken of Ofev by [name] for awhile, but he was recently started back on it for his IPF. He was given the opportunity to participate in a clinical trial for this but he chose not to do so.    Review of Systems  Constitutional: Negative.   HENT: Positive for ear pain. Negative for congestion, dental problem, postnasal drip, sinus pressure, sinus pain and sore throat.   Eyes: Negative.   Respiratory: Negative.   Cardiovascular: Negative.   Gastrointestinal: Negative.   Genitourinary: Negative.   Musculoskeletal: Negative.   Skin: Negative.   Neurological: Negative.   Psychiatric/Behavioral: Negative.        Objective:   Physical Exam  Constitutional: He is oriented to person, place, and time. He appears well-developed and well-nourished. No distress.  HENT:  Head: Normocephalic and atraumatic.  Right Ear: External ear normal.  Left Ear: External ear normal.  Nose: Nose normal.  Mouth/Throat: Oropharynx is clear and moist. No oropharyngeal exudate.  No TMJ tenderness   Eyes: Conjunctivae and EOM are normal. Pupils are equal, round, and reactive to light. Right eye exhibits no discharge. Left eye exhibits no discharge. No scleral icterus.  Neck: Neck supple. No JVD present. No tracheal deviation present. No thyromegaly present.  Cardiovascular: Normal rate, regular rhythm, normal heart sounds and intact distal pulses. Exam reveals no gallop and no friction rub.  No murmur heard. Pulmonary/Chest: Effort normal and breath  sounds normal. No respiratory distress. He has no wheezes. He has no rales. He exhibits no tenderness.  Abdominal: Soft. Bowel sounds are normal. He exhibits no distension and no mass. There is no tenderness. There is no rebound and no guarding.  Genitourinary: Rectum normal, prostate normal and penis normal. Rectal exam shows guaiac negative stool. No penile tenderness.  Musculoskeletal: Normal range of motion. He exhibits no edema or tenderness.  Lymphadenopathy:    He has no cervical adenopathy.  Neurological: He is alert and oriented to person, place, and time. He has normal reflexes. No cranial nerve deficit. He exhibits normal muscle tone. Coordination normal.  Skin: Skin is warm and dry. No rash noted. He is not diaphoretic. No erythema. No pallor.  Psychiatric: He has a normal mood and affect. His behavior is normal. Judgment and thought content normal.          Assessment & Plan:  His GERD and IPF are stable. We will get fasting labs to check his lipids, etc. For the ear pain he will try Gabapentin 100 mg bid.  Alysia Penna, MD

## 2018-02-23 NOTE — Progress Notes (Addendum)
Subjective:   Tanner Bishop is a 72 y.o. male who presents for Medicare Annual/Subsequent preventive examination.  Reports health is fair IPS is his challenge    Diet Eats light breakfast, cereal and fruit 2 to 3 pm eat lunch;  Lacinda Axon together Sometimes he has light meal at hs  Was 185 and now lost down to 166  Chol 192; hdl 59   BMI 25   Exercise Walks 5 miles on the treadmill... 90 minutes 3.6 an hour  Incline on 5  Lifts a few weights   Colonoscopy 02/2017   Cardiac Risk Factors include: family history of premature cardiovascular disease;advanced age (>34mn, >>44women)     Smoking hx; quit date > than 15 years CT abd and pelvis 05/2014  Will discuss AAA with Dr. FSarajane Jews  There are no preventive care reminders to display for this patient.  Will get a tetanus if he has a dirty laceration or other. Thinks he may have had the tetanus with the pertussis   Objective:    Vitals: BP 120/70   Pulse (!) 58   Ht _0  (1.727 m)   Wt 166 lb 7 oz (75.5 kg)   SpO2 96%   BMI 25.31 kg/m   Body mass index is 25.31 kg/m.  Advanced Directives 02/23/2018 02/16/2017 02/15/2017 07/10/2015 04/08/2015 03/26/2015 07/12/2014  Does Patient Have a Medical Advance Directive? _1  Yes Patient does not have advance directive;Patient would like information  Type of Advance Directive - - [place] will [place] will Living will;Healthcare Power of [place] -  Does patient want to make changes to medical advance directive? - - - - No - Patient declined - -  Copy of [place] Chart? - - - - No - copy requested - -  Would patient like information on creating a medical advance directive? - - - - - - Advance directive packet given  Pre-existing out of facility DNR order (yellow form or pink MOST form) - - - - - - -    Tobacco Social History   Tobacco Use  Smoking Status  Former Smoker  . Packs/day: 1.00  . Years: 16.00  . Pack years: 16.00  . Types: Cigarettes  . Last attempt to quit: 12/14/1978  . Years since quitting: 39.2  Smokeless Tobacco Never Used     Counseling given: Yes   Clinical Intake:     Past Medical History:  Diagnosis Date  . Allergy   . Arthritis   . Atrial flutter ([place]    a. s/p ablation 03/2015 Dr TLovena Le . Cataract    removed both eyes  . GERD (gastroesophageal reflux disease)   . H/O hiatal hernia   . Hyperlipidemia   . Injury of right hand    permanent damage [facility] 2009 [facility]  . Pulmonary fibrosis ([place]    sees Dr. ROnnie Graham  . Renal stone 06/2014  . Ulcer    unresolved   Past Surgical History:  Procedure Laterality Date  . ATRIAL FLUTTER ABLATION N/A 04/08/2015   Procedure: ATRIAL FLUTTER ABLATION;  Surgeon: GEvans Lance MD;  [facility];  Service: Cardiovascular;  Laterality: N/A;  . CATARACT EXTRACTION, BILATERAL  2016  . COLONOSCOPY  03/01/2017   per Dr. DLoletha Carrow clear, repeat in 5 yrs (brother had colon cancer)   . ESOPHAGOGASTRODUODENOSCOPY  2007  .  EXTRACORPOREAL SHOCK WAVE LITHOTRIPSY  06-17-14   per Dr. Jeffie Pollock   . HAND SURGERY Right   . LUNG BIOPSY Left 03/22/2013   Procedure: LUNG BIOPSY;  Surgeon: Melrose Nakayama, MD;  Location: [facility];  Service: Thoracic;  Laterality: Left;  . POLYPECTOMY    . UPPER GASTROINTESTINAL ENDOSCOPY    . VIDEO ASSISTED THORACOSCOPY Left 03/22/2013   Procedure: VIDEO ASSISTED THORACOSCOPY;  Surgeon: Melrose Nakayama, MD;  Location: [facility];  Service: Thoracic;  Laterality: Left;   Family History  Problem Relation Age of Onset  . Liver cancer Mother   . Diabetes Sister   . Lung cancer Sister   . Heart disease Brother   . Diabetes Brother   . Colon cancer Brother   . Diabetes Unknown   . Cancer Unknown        prostate  . Colon polyps Neg Hx   . Esophageal cancer Neg Hx   . Rectal cancer Neg Hx   . Stomach  cancer Neg Hx    Social History   Socioeconomic History  . Marital status: Married    Spouse name: Not on file  . Number of children: Not on file  . Years of education: Not on file  . Highest education level: Not on file  Social Needs  . Financial resource strain: Not on file  . Food insecurity - worry: Not on file  . Food insecurity - inability: Not on file  . Transportation needs - medical: Not on file  . Transportation needs - non-medical: Not on file  Occupational History  . 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 Occupation: Disabled   Tobacco Use  . Smoking status: Former Smoker    Packs/day: 1.00    Years: 16.00    Pack years: 16.00    Types: Cigarettes    Last attempt to quit: 12/14/1978    Years since quitting: 39.2  . Smokeless tobacco: Never Used  Substance and Sexual Activity  . Alcohol use: No    Alcohol/week: 0.0 oz  . Drug use: No  . Sexual activity: Not on file  Other Topics Concern  . Not on file  Social History Narrative  . Not on file    Outpatient Encounter Medications as of 02/23/2018  Medication Sig  . diphenhydrAMINE (BENADRYL) 25 MG tablet Take 25 mg by mouth every 6 (six) hours as needed for allergies or sleep.  . fluticasone (FLONASE) 50 MCG/ACT nasal spray Place 2 sprays into both nostrils daily.  Marland Kitchen morphine 10 MG/5ML solution Take 2 mLs (4 mg total) by mouth 2 (two) times daily as needed.  . Nintedanib (OFEV) 150 MG CAPS Take 1 capsule (150 mg total) by mouth 2 (two) times daily.  . [DISCONTINUED] pantoprazole (PROTONIX) 40 MG tablet Take 1 tablet (40 mg total) by mouth 2 (two) times daily.  . [DISCONTINUED] ranitidine (ZANTAC) 300 MG tablet TAKE 1 TABLET BY MOUTH AT BEDTIME  . [DISCONTINUED] zolpidem (AMBIEN) 5 MG tablet Take 1 tablet (5 mg total) by mouth at bedtime as needed for sleep.  . chlorpheniramine-HYDROcodone (TUSSIONEX PENNKINETIC ER) 10-8 MG/5ML SUER Take 5 mLs by mouth 2 (two) times daily as needed for cough.  (Patient not taking: Reported on 02/23/2018)  . diclofenac (VOLTAREN) 75 MG EC tablet Take 1 tablet (75 mg total) by mouth 2 (two) times daily.   Facility-Administered Encounter Medications as of 02/23/2018  Medication  . 0.9 %  sodium chloride infusion    Activities of  Daily Living In your present state of health, do you have any difficulty performing the following activities: 02/23/2018  Hearing? N  Vision? N  Difficulty concentrating or making decisions? N  Walking or climbing stairs? N  Dressing or bathing? N  Doing errands, shopping? N  Preparing Food and eating ? N  Using the Toilet? N  In the past six months, have you accidently leaked urine? N  Do you have problems with loss of bowel control? Y  Comment with OFEV   Managing your Medications? N  Managing your Finances? N  Housekeeping or managing your Housekeeping? N  Some recent data might be hidden    Patient Care Team: [name], MD as PCP - General Brand Males, MD as Consulting Physician (Pulmonary Disease)   Assessment:   This is a routine wellness examination for Tanner Bishop.  Exercise Activities and Dietary recommendations Current Exercise Habits: Home exercise routine;Structured exercise class, Type of exercise: walking, Time (Minutes): 60, Frequency (Times/Week): 5, Weekly Exercise (Minutes/Week): 300, Intensity: Intense(5 miles a day)  Goals    . Patient Stated     Would like to travel some; [place];  To maintain exercise and current lifestyle        Fall Risk Fall Risk  02/23/2018 02/16/2017 02/16/2017 02/27/2015  Falls in the past year? No No No No     Depression Screen PHQ 2/9 Scores 02/23/2018 02/16/2017 02/16/2017 02/27/2015  PHQ - 2 Score 0 0 0 0    Cognitive Function MMSE - Mini Mental State Exam 02/23/2018 02/16/2017  Not completed: (No Data) (No Data)     Ad8 score reviewed for issues:  Issues making decisions:  Less interest in hobbies / activities:  Repeats questions, stories (family  complaining):  Trouble using ordinary gadgets (microwave, computer, phone):  Forgets the month or year:   Mismanaging finances:   Remembering appts:  Daily problems with thinking and/or memory: Ad8 score is=0        Immunization History  Administered Date(s) Administered  . Influenza Split 09/13/2013  . Influenza, High Dose Seasonal PF 10/06/2016, 09/16/2017  . Influenza-Unspecified 09/07/2014, 09/14/2015  . Pneumococcal Conjugate-13 02/16/2017  . Pneumococcal Polysaccharide-23 05/26/2013  . Td 12/15/2007  . Zoster 02/19/2014    Screening Tests Health Maintenance  Topic Date Due  . TETANUS/TDAP  02/24/2019 (Originally 12/14/2017)  . Hepatitis C Screening  02/24/2019 (Originally Sep 15, 1946)  . COLONOSCOPY  03/01/2022  . INFLUENZA VACCINE  Completed  . PNA vac Low Risk Adult  Completed       Plan:      PCP Notes   Health Maintenance Discussed Hep C and will hold for now;  May discuss with Dr. Sarajane Jews  Had CT of abd and pelvis 05/2014; will defer AAA for now  Educated regarding shingrix vaccines   Abnormal Screens  none  Referrals  none  Patient concerns; Pain in left ear. To discuss with Dr. Sarajane Jews  Nurse Concerns; As noted   Next PCP apt Seeing Dr. Sarajane Jews today      I have personally reviewed and noted the following in the patient's chart:   . Medical and social history . Use of alcohol, tobacco or illicit drugs  . Current medications and supplements . Functional ability and status . Nutritional status . Physical activity . Advanced directives . List of other physicians . Hospitalizations, surgeries, and ER visits in previous 12 months . Vitals . Screenings to include cognitive, depression, and falls . Referrals and appointments  In addition, I have  reviewed and discussed with patient certain preventive protocols, quality metrics, and best practice recommendations. A written personalized care plan for preventive services as well as general  preventive health recommendations were provided to patient.     ANVBT,YOMAY, RN  02/23/2018  I have reviewed this note and agree with its contents.  Alysia Penna, MD

## 2018-03-01 ENCOUNTER — Telehealth: Payer: Self-pay | Admitting: Internal Medicine

## 2018-03-01 MED ORDER — HYDROCOD POLST-CPM POLST ER 10-8 MG/5ML PO SUER: 5.0000 mL | Freq: Two times a day (BID) | ORAL | 0 refills | Status: DC | PRN

## 2018-03-01 MED ORDER — PREDNISONE 10 MG PO TABS: ORAL_TABLET | ORAL | 0 refills | Status: DC

## 2018-03-01 NOTE — Telephone Encounter (Signed)
Spoke with the pt and notified of recs per MR  He verbalized understanding  I have sent rx for pred  Placed the tussionex up front for pick up

## 2018-03-01 NOTE — Telephone Encounter (Signed)
Ok to refill tussionex. FYI 0- if yiou want me to sign it has to be on or before 13.35 03/01/2018   Also he has always asked about prednisone which has helped cough - please let him know that we can try Take prednisone 40 mg daily x 2 days, then 40m daily x 2 days, then 12mdaily x 2 days, then 37m137maily  To continue  And respect and appreciate decision on trial decline  Dr. MurBrand Males.D., F.CCarlsbad Surgery Center LLCP Pulmonary and Critical Care Medicine Staff Physician, [place]des[place]rector - Interstitial Lung Disease  Program  Pulmonary FibFayette LebPiedra AguzaC,[id number]: [phone] no answer or between  15:00h - 7:00h: call [phone] Telephone: [phone]

## 2018-03-01 NOTE — Telephone Encounter (Signed)
Called and spoke to pt.  Pt is requesting refill on Tussionex, as he has dry cough x2-3wk. Cough worsens at night.  Denies additional symptoms. Tussionex last refilled 08/31/17 #160m. Pt also wanted to make MR aware that he does not wish to do the clinical trial in June.  MR please advise. Thanks

## 2018-03-13 ENCOUNTER — Other Ambulatory Visit: Payer: Self-pay | Admitting: Internal Medicine

## 2018-03-16 ENCOUNTER — Other Ambulatory Visit (INDEPENDENT_AMBULATORY_CARE_PROVIDER_SITE_OTHER): Payer: PPO

## 2018-03-16 ENCOUNTER — Encounter: Payer: Self-pay | Admitting: Internal Medicine

## 2018-03-16 ENCOUNTER — Ambulatory Visit (INDEPENDENT_AMBULATORY_CARE_PROVIDER_SITE_OTHER): Payer: PPO | Admitting: Internal Medicine

## 2018-03-16 ENCOUNTER — Ambulatory Visit: Payer: PPO | Admitting: Internal Medicine

## 2018-03-16 VITALS — BP 122/78 | HR 70 | Ht 69.0 in | Wt 174.0 lb

## 2018-03-16 DIAGNOSIS — J84112 Idiopathic pulmonary fibrosis: Secondary | ICD-10-CM

## 2018-03-16 DIAGNOSIS — Z7184 Encounter for health counseling related to travel: Secondary | ICD-10-CM

## 2018-03-16 DIAGNOSIS — Z5181 Encounter for therapeutic drug level monitoring: Secondary | ICD-10-CM | POA: Diagnosis not present

## 2018-03-16 LAB — PULMONARY FUNCTION TEST
DL/VA % pred: 94 %
DL/VA: 4.15 ml/min/mmHg/L
DLCO cor % pred: 46 %
DLCO cor: 13.14 ml/min/mmHg
DLCO unc % pred: 46 %
DLCO unc: 13.1 ml/min/mmHg
FEF 25-75 Pre: 3.07 L/sec
FEF2575-%Pred-Pre: 142 %
FEV1-%Pred-Pre: 70 %
FEV1-Pre: 2.02 L
FEV1FVC-%Pred-Pre: 120 %
FEV6-%Pred-Pre: 62 %
FEV6-Pre: 2.28 L
FEV6FVC-%Pred-Pre: 106 %
FVC-%Pred-Pre: 58 %
FVC-Pre: 2.29 L
Pre FEV1/FVC ratio: 88 %
Pre FEV6/FVC Ratio: 100 %

## 2018-03-16 LAB — HEPATIC FUNCTION PANEL
ALT: 10 U/L (ref 0–53)
AST: 19 U/L (ref 0–37)
Albumin: 3.9 g/dL (ref 3.5–5.2)
Alkaline Phosphatase: 50 U/L (ref 39–117)
Bilirubin, Direct: 0.1 mg/dL (ref 0.0–0.3)
Total Bilirubin: 0.5 mg/dL (ref 0.2–1.2)
Total Protein: 7.4 g/dL (ref 6.0–8.3)

## 2018-03-16 NOTE — Progress Notes (Signed)
Subjective:     Patient ID: Tanner Bishop, male   DOB: 1946/08/16, 72 y.o.   MRN: [id number]  HPI   #IPF - uip path 03/22/13  - On OFEV since early May 2015   PFT FVC fev1 ratio BD fev1 TLC DLCO Walk test 154f x 3 laps wt rx             Spring 2014 2.9:L L/% /%        June 2015 2.7L/67% 2.34L/78% 86  3.68/57% 20.24/71%   ofev start  [name]  2016 2.5L/55% 2.1L/60% 84/108%    No desat, Pk HR 130    02/11/2015 Screening visit afferent cough study 2.59L/56% 2.24L/63% 87/112%    Dx with afluttter on ekg   Screen failed due to hx of stones  03/26/2015        No desat, PK HR 130    08/26/15 pft machine 2.73L/68% 2.38L/80% 87   [name]     06/29/2016 Office spiro 2.49L/56% 2.14L/65%     Pk HR 90 and lowest pulse ox 99% ->93%  ofev  11/02/2016  office full pft  2.61L/66% 2.3L/79%    17.23/60%   pfev  09/16/2017  2.53L/64%      Pulse ox 99% -> 93%, HR 81- > 84  ofev  12/22/2017        100% -> 97%,  HR 66 -> 96         OV 03/26/2015  Chief Complaint  Patient presents with  . Follow-up    [name], dry cough. CATH procedure on 04/08/15. Denies any chest tightnes/congestion.    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 He is on Ofev after having failed Esbriet in the past due to side effects. He is tolerating Ofev well. Liver function test in March 2016 was normal. I last saw him in January 2016. Subsequently in February 2016 we screened him for a cough idiopathic pulmonary fibrosis study called afferent but he screen failed due to history of renal stones. At this time he was incidentally diagnosed with new onset atrial flutter. He has seen Dr. GCrissie Sicklesand has been started on anticoagulation with Xarelto and metoprolol. He is due to undergo a ablation. He is not aware of the increased bleeding risk with Ofev in the setting of anticoagulation. He assures me that the anti-coag and is only until he undergoes ablation and after that the plan is to stop it. Therefore  only short-term anticoagulation with Xarelto. Overall his effort tolerance is fine. He has postponed his [facility] lung transplant until he completes his ablation.  Walking desaturation test in the office today he did not desaturate. This is stable. Spirometry not done   Past medical history: Atrial flutter new diagnoses as above   OV 06/29/2016  Chief Complaint  Patient presents with  . Follow-up    pt states he is at baseline: sob with exertion, nonprod cough.      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 He is on Ofev . He completed 6 months of PRAISE study (([id number] placebo) in l;ate winter/early spring 2017. This is SGordonvisit. Overall stable. No worsening dyspnea and cough. Continues daily exercise is walking many miles. Some days he is sitting more than the others. He is interested in more trials and results of the praise study. These results are not out yet. He is compliant with his Ofe last liver function test was in February 2017. There are no new issues. He believes his  atrial fibrillation is in sinus rhythm;    OV  11/02/2016  Chief Complaint  Patient presents with  . Follow-up    4 month IPF follow up and B&A review - does report increased prod cough with yellow mucus, wheezing, tightness in chest, increased SOB, x3-4 weeks with the weather change.  denies any f/c/s, hemoptysis, chest pain   IPF - on ofev. Last liver function test was in July 2017 and normal. Overall dyspnea stable. However he is been having diarrhea with ofev. He called in and we reduced it to 1 tablet a day and this improved the diarrhea. He back on 1 tablet twice a day. The diarrhea has returned although it is much milder. He has never taken any medication for this. Are function test shows slight improvement from July 2017 and similar levels to approximately one year ago  The new issue that he's having significant recurrent sinus infection in the back of chronic postnasal drainage. This since the fall  2017. He says that he and his wife catch respirator infection and passive back and forth to each other. He is having cough, chest tightness, postnasal drip and yellow sputum. It is not affecting any dyspnea. There is no wheeze. There is no fever or hemoptysis.  OV 03/02/2017  Chief Complaint  Patient presents with  . Follow-up    Pt states his breathing is at baseline. Pt c/o dry cough - pt states this is baseline. Pt denies CP/tightness.     Idiopathic pulmonary fibrosis on Ofev. Last seen November 2017. He is a routine follow-up.  He is here with his wife. His sinus infections a result. He is interested in research protocols but we have none right now. He is tolerating his Ofev associated with some mild diarrhea occasional which she takes medication. He is not much of a problem. He tells me that she walks 6 times a week for 5 miles a day. On the treadmill at a 5 incline walking at 3-1/2 miles an hour. For this he feels a little more dyspneic than before. Walking desaturation test 185 feet 3 laps on room air: His pulse ox dropped from 98% at rest and 93% with exertion. His heart rate jumped from 66 at rest and 97 at peak exertion. Features are suggestive of mild progression of the disease. He is not attending pulmonary fibrosis foundation because he felt this was negative but that was years ago. We discussed this and he might be interested again.     OV 09/16/2017  Chief Complaint  Patient presents with  . Follow-up    PFT done today. Pt states that his breathing is gradually getting worse. SOB which depends if it is on exertion vs all the time and c/o prod cough with yellow mucus. Denies any CP   S IPF on fev followup. Overall doing well. He recent bronchitis and liked anabiotic and prednisone. He felt the prednisone helped his cough just currently rated as mild to moderate in severity. Helped the shortness of breath. He also helped arthralgia. He is now wondering what taking chronic  prednisone. We had an extensive discussion about this. Spirometry shows that the FVC stable compared to earlier this year but slightly progressive compared to one year ago. [facility] interstitial lung disease questionnaire shows symptoms. His wife is here with him. He is interested in research trials. He has participated in distress before. He is asking about opioid refill for cough if I wont do prednisone.   OV 12/22/2017  Chief  Complaint  Patient presents with  . Follow-up    Pt states he believes his breathing is at a stable point right now. States that he is coughing which is worse when he first wakes up and then also at night; occ will cough up yellow phlegm.     C.o ofev intolerance with fatigue, weight loss, diarrhea. Does not like lomotil. Wnaant to give it a holiday foir 2-3 weeks. INterested in research protocol - discussed [place]. 5 year cut off since diagnosis coming up 03/22/18. He is overall frustrated ith qualtiy of life. Walks 5 miles; starts feeling dizzy > 3 miles in but not checking pulse ox despite advice. Walking desaturation test on 12/22/2017 185 feet x 3 laps on ROOM AIR:  did noit desaturate. Rest pulse ox was 100%, final pulse ox was 97%. HR response 66/min at rest to 96/min at peak exertion. Patient Uday Altman  Did not Desaturate < 88% . Kiefer Holan yes  Desaturated </= 3% points. Mckinley Leitzel yes did get tachyardic     Walking desaturation test on 02/01/2018 185 feet x 3 laps on ROOM AIR:  did not desaturate. Rest pulse ox was 100%, final pulse ox was 91%. HR response 67/min at rest to 85/min at peak exertion. Patient Joshuwa Kainz  dod not Desaturate < 88% . Laura Hovland yes diod  Desaturated </= 3% points. Ayrton Wuest did not get tachyardic   OV 02/01/2018  Chief Complaint  Patient presents with  . Follow-up    Pt states he has been doing good since last visit. Started back on OFEV 12/24/17 after taking a holiday off of it.     FU IPF  Took ofev holiday.  And then restarted 01/24/18 and doing well. [place] ofev ok. KBILD ok.   OV 03/16/2018'  . Chief Complaint  Patient presents with  . Follow-up    PFT done today. States he has been doing well and denies any current complaints. Tolerating OFEV well and has been walking about 5 miles every day with no complaints.   Ms. Denis returns for follow-up of idiopathic pulmonary fibrosis.  He is now back on nintedanib after giving it a holiday for a few weeks.  He is tolerating it well and no problems.  He goes for daily walks and has no problems.  His lung function was reviewed today and it shows for the first time a significant decline of greater than 200 cc between 2 time points.  This correlates with him not taking over for a few weeks nevertheless overall he is happy with his health.  He is not interested in research trials anymore.  He has a new question about travel advice encounter.  He plans an [place] from [place], [place] in [place] for 10 days.  This will start on May 22, 2018.  He wants to make sure it is safe for him to go.  He said he has never been on a cruise and he and his wife have a strong desire to go as part of his goals of care.   K-BILD ILD QUESTIONNAIRE, Symptom score over prior 2 weeks  7-none, 6-rarely, 5-occ, 5-some times, 3-sev times, 2-most times, 1-every time 09/16/2017  12/22/2017  02/01/2018   Dyspnea for stairs, incline or hill _0 Chest Tightness _1 Worry about seriousness of lung complaint _2 Avoided doing things that make you dyspneic _3 Have you felt loss of control  of lung condition (reversed from original) _0 Felt fed up due to lung condition _1 Felt urge to breathe aka air hunger _2 Has lung condition made you feel anxious _3 How often have you experienced wheezing or whistling sound _4 How much of the time have you felt your lung dz is getting worse _5 How much has your lung  condition interfered with job or daily task _6 Were you expecting your lung condition to get worse _7 How much has your lung function limited you carrying things like groceris _8 How much has your lung function made you think of EOL? _9 Total     Are you financially worse off _10 Grand Total        Results for TRUMAINE, WIMER (MRN [id number]) as of 09/16/2017 09:43  Ref. Range 05/14/2014 15:46 07/24/2015 12:43 08/26/2015 10:52 11/02/2016 09:51 05/19/2017 08:41 09/16/2017 08:47 03/16/17  FVC-Pre Latest Units: L 2.71 2.69 2.73 2.61 2.51 2.53 2.29  FVC-%Pred-Pre Latest Units: % 67 67 68 66 63 64 58   Results for KI, CORBO (MRN [id number]) as of 09/16/2017 09:43  Ref. Range 05/14/2014 15:46 07/24/2015 12:43 08/26/2015 10:52 11/02/2016 09:51 05/19/2017 08:41 09/16/2017 08:47 03/16/2018   DLCO unc Latest Units: ml/min/mmHg 20.24 15.36 15.36 17.23 15.83  13.14  DLCO unc % pred Latest Units: % 71 54 54 60 55  46       has a past medical history of Allergy, Arthritis, Atrial flutter ([place]), Cataract, GERD (gastroesophageal reflux disease), H/O hiatal hernia, Hyperlipidemia, Injury of right hand, Pulmonary fibrosis ([place]), Renal stone (06/2014), and Ulcer.   reports that he quit smoking about 39 years ago. His smoking use included cigarettes. He has a 16.00 pack-year smoking history. He has never used smokeless tobacco.  Past Surgical History:  Procedure Laterality Date  . ATRIAL FLUTTER ABLATION N/A 04/08/2015   Procedure: ATRIAL FLUTTER ABLATION;  Surgeon: Evans Lance, MD;  Location: [facility] CATH LAB;  Service: Cardiovascular;  Laterality: N/A;  . CATARACT EXTRACTION, BILATERAL  2016  . COLONOSCOPY  03/01/2017   per Dr. Loletha Carrow, clear, repeat in 5 yrs (brother had colon cancer)   . ESOPHAGOGASTRODUODENOSCOPY  2007  . EXTRACORPOREAL SHOCK WAVE LITHOTRIPSY  06-17-14   per Dr. Jeffie Pollock   . HAND SURGERY Right   . LUNG BIOPSY Left 03/22/2013   Procedure: LUNG BIOPSY;  Surgeon: Melrose Nakayama, MD;   Location: [facility];  Service: Thoracic;  Laterality: Left;  . POLYPECTOMY    . UPPER GASTROINTESTINAL ENDOSCOPY    . VIDEO ASSISTED THORACOSCOPY Left 03/22/2013   Procedure: VIDEO ASSISTED THORACOSCOPY;  Surgeon: Melrose Nakayama, MD;  Location: [facility];  Service: Thoracic;  Laterality: Left;    No Known Allergies  Immunization History  Administered Date(s) Administered  . Influenza Split 09/13/2013  . Influenza, High Dose Seasonal PF 10/06/2016, 09/16/2017  . Influenza-Unspecified 09/07/2014, 09/14/2015  . Pneumococcal Conjugate-13 02/16/2017  . Pneumococcal Polysaccharide-23 05/26/2013  . Td 12/15/2007  . Tdap 02/23/2018  . Zoster 02/19/2014    Family History  Problem Relation Age of Onset  . Liver cancer Mother   . Diabetes Sister   . Lung cancer Sister   . Heart disease Brother   . Diabetes Brother   . Colon cancer Brother   . Diabetes Unknown   . Cancer Unknown  prostate  . Colon polyps Neg Hx   . Esophageal cancer Neg Hx   . Rectal cancer Neg Hx   . Stomach cancer Neg Hx      Current Outpatient Medications:  .  chlorpheniramine-HYDROcodone (TUSSIONEX PENNKINETIC ER) 10-8 MG/5ML SUER, Take 5 mLs by mouth 2 (two) times daily as needed for cough., Disp: 140 mL, Rfl: 0 .  diclofenac (VOLTAREN) 75 MG EC tablet, Take 1 tablet (75 mg total) by mouth 2 (two) times daily., Disp: 60 tablet, Rfl: 2 .  diphenhydrAMINE (BENADRYL) 25 MG tablet, Take 25 mg by mouth every 6 (six) hours as needed for allergies or sleep., Disp: , Rfl:  .  fluticasone (FLONASE) 50 MCG/ACT nasal spray, Place 2 sprays into both nostrils daily., Disp: 48 g, Rfl: 5 .  gabapentin (NEURONTIN) 100 MG capsule, Take 1 capsule (100 mg total) by mouth 3 (three) times daily., Disp: 60 capsule, Rfl: 5 .  morphine 10 MG/5ML solution, Take 2 mLs (4 mg total) by mouth 2 (two) times daily as needed., Disp: 120 mL, Rfl: 0 .  Nintedanib (OFEV) 150 MG CAPS, Take 1 capsule (150 mg total) by mouth 2 (two) times daily.,  Disp: 60 capsule, Rfl: 12 .  pantoprazole (PROTONIX) 40 MG tablet, Take 1 tablet (40 mg total) by mouth 2 (two) times daily., Disp: 180 tablet, Rfl: 3 .  ranitidine (ZANTAC) 300 MG tablet, TAKE 1 TABLET BY MOUTH AT BEDTIME, Disp: 90 tablet, Rfl: 1 .  zolpidem (AMBIEN) 5 MG tablet, Take 1 tablet (5 mg total) by mouth at bedtime as needed for sleep., Disp: 30 tablet, Rfl: 5  Current Facility-Administered Medications:  .  0.9 %  sodium chloride infusion, 500 mL, Intravenous, Continuous, Danis, Kirke Corin, MD  Review of Systems     Objective:   Physical Exam  Constitutional: He is oriented to person, place, and time. He appears well-developed and well-nourished. No distress.  HENT:  Head: Normocephalic and atraumatic.  Right Ear: External ear normal.  Left Ear: External ear normal.  Mouth/Throat: Oropharynx is clear and moist. No oropharyngeal exudate.  Eyes: Pupils are equal, round, and reactive to light. Conjunctivae and EOM are normal. Right eye exhibits no discharge. Left eye exhibits no discharge. No scleral icterus.  Neck: Normal range of motion. Neck supple. No JVD present. No tracheal deviation present. No thyromegaly present.  Cardiovascular: Normal rate, regular rhythm and intact distal pulses. Exam reveals no gallop and no friction rub.  No murmur heard. Pulmonary/Chest: Effort normal. No respiratory distress. He has no wheezes. He has rales. He exhibits no tenderness.  Abdominal: Soft. Bowel sounds are normal. He exhibits no distension and no mass. There is no tenderness. There is no rebound and no guarding.  Musculoskeletal: Normal range of motion. He exhibits no edema or tenderness.  Rt 4th finger missing Mild clubbing  Lymphadenopathy:    He has no cervical adenopathy.  Neurological: He is alert and oriented to person, place, and time. He has normal reflexes. No cranial nerve deficit. Coordination normal.  Skin: Skin is warm and dry. No rash noted. He is not diaphoretic. No  erythema. No pallor.  Psychiatric: He has a normal mood and affect. His behavior is normal. Judgment and thought content normal.  Nursing note and vitals reviewed.  Vitals:   03/16/18 0929  BP: 122/78  Pulse: 70  SpO2: 97%  Weight: 174 lb (78.9 kg)  Height: _0  (1.753 m)    Estimated body mass index is 25.7 kg/m  as calculated from the following:   Height as of this encounter: _0  (1.753 m).   Weight as of this encounter: 174 lb (78.9 kg).       Assessment:       ICD-10-CM   1. Travel advice encounter Z71.89   2. IPF (idiopathic pulmonary fibrosis) ([place]) J84.112   3. Encounter for therapeutic drug monitoring Z51.81        Plan:     Travel advice encounter   - ok to go to [place] cruise on May 22, 2018. You will have a great time - However, before you go come and see [place] first week of June 2019 to get lft check, antibiotic, prednisone scripts and oxygen need asssesment   IPF (idiopathic pulmonary fibrosis) ([place]) Encounter for therapeutic drug monitoring  - some decline in lung function since Oct 2018; not clear if this was due to ofev holiday for just a few weeks - check LFT 03/16/2018  - respect no interest in research trila - check in June 2019 . - Pre-bd spiro and dlco only. No lung volume or bd response.   Followup Early June 2019 -   Dr. Brand Males, M.D., F.C.C.P Pulmonary and Critical Care Medicine Staff Physician, [place] Director - Interstitial Lung Disease  Program  Pulmonary [place] [facility] [place], [place], [address]  Pager: [phone], If no answer or between  15:00h - 7:00h: call [phone] Telephone: [phone]

## 2018-03-16 NOTE — Patient Instructions (Addendum)
Travel advice encounter   - ok to go to [place] cruise on May 22, 2018. You will have a great time - However, before you go come and see [place] first week of June 2019 to get lft check, antibiotic, prednisone scripts and oxygen need asssesment   IPF (idiopathic pulmonary fibrosis) ([place]) Encounter for therapeutic drug monitoring  - some decline in lung function since Oct 2018; not clear if this was due to ofev holiday for just a few weeks - check LFT 03/16/2018  - respect no interest in research trila - check in June 2019 . - Pre-bd spiro and dlco only. No lung volume or bd response.   Followup Early June 2019 -

## 2018-03-16 NOTE — Progress Notes (Signed)
Patient completed Spiro & DLCO today. 

## 2018-04-14 ENCOUNTER — Ambulatory Visit (INDEPENDENT_AMBULATORY_CARE_PROVIDER_SITE_OTHER): Payer: PPO | Admitting: Adult Health

## 2018-04-14 ENCOUNTER — Encounter: Payer: Self-pay | Admitting: Adult Health

## 2018-04-14 VITALS — BP 126/60 | Temp 98.4°F | Wt 172.0 lb

## 2018-04-14 DIAGNOSIS — L309 Dermatitis, unspecified: Secondary | ICD-10-CM | POA: Diagnosis not present

## 2018-04-14 MED ORDER — TRIAMCINOLONE ACETONIDE 0.5 % EX OINT: 1.0000 "application " | TOPICAL_OINTMENT | Freq: Two times a day (BID) | CUTANEOUS | 0 refills | Status: DC

## 2018-04-14 NOTE — Progress Notes (Signed)
Subjective:    Patient ID: Tanner Bishop, male    DOB: 03/28/1946, 72 y.o.   MRN: [id number]  HPI  72 year old male who presents to the office today for red rash under his bilateral eyes.  Rashes been present for 3 to 4 days.  Has used Benadryl cream and over-the-counter cortisone cream.  No relief in symptoms with these topical therapies.  Denies any changes to soaps, detergents, or foods.  Denies any blurred vision   Review of Systems See HPI   Past Medical History:  Diagnosis Date  . Allergy   . Arthritis   . Atrial flutter ([place])    a. s/p ablation 03/2015 Dr Lovena Le  . Cataract    removed both eyes  . GERD (gastroesophageal reflux disease)   . H/O hiatal hernia   . Hyperlipidemia   . Injury of right hand    permanent damage after workplace 2009 and 2010 Dr [facility]  . Pulmonary fibrosis ([place])    sees Dr. Onnie Graham   . Renal stone 06/2014  . Ulcer    unresolved    Social History   Socioeconomic History  . Marital status: Married    Spouse name: Not on file  . Number of children: Not on file  . Years of education: Not on file  . Highest education level: Not on file  Occupational History  . 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 Occupation: Disabled   Social Needs  . Financial resource strain: Not on file  . Food insecurity:    Worry: Not on file    Inability: Not on file  . Transportation needs:    Medical: Not on file    Non-medical: Not on file  Tobacco Use  . Smoking status: Former Smoker    Packs/day: 1.00    Years: 16.00    Pack years: 16.00    Types: Cigarettes    Last attempt to quit: 12/14/1978    Years since quitting: 39.3  . Smokeless tobacco: Never Used  Substance and Sexual Activity  . Alcohol use: No    Alcohol/week: 0.0 oz  . Drug use: No  . Sexual activity: Not on file  Lifestyle  . Physical activity:    Days per week: Not on file    Minutes per session: Not on file  . Stress: Not on file    Relationships  . Social connections:    Talks on phone: Not on file    Gets together: Not on file    Attends religious service: Not on file    Active member of club or organization: Not on file    Attends meetings of clubs or organizations: Not on file    Relationship status: Not on file  . Intimate partner violence:    Fear of current or ex partner: Not on file    Emotionally abused: Not on file    Physically abused: Not on file    Forced sexual activity: Not on file  Other Topics Concern  . Not on file  Social History Narrative  . Not on file    Past Surgical History:  Procedure Laterality Date  . ATRIAL FLUTTER ABLATION N/A 04/08/2015   Procedure: ATRIAL FLUTTER ABLATION;  Surgeon: Evans Lance, MD;  [facility] C[facility] LAB;  Service: Cardiovascular;  Laterality: N/A;  . CATARACT EXTRACTION, BILATERAL  2016  . COLONOSCOPY  03/01/2017   per Dr. Loletha Carrow, clear, repeat in 5 yrs (brother had colon cancer)   .  ESOPHAGOGASTRODUODENOSCOPY  2007  . EXTRACORPOREAL SHOCK WAVE LITHOTRIPSY  06-17-14   per Dr. Jeffie Pollock   . HAND SURGERY Right   . LUNG BIOPSY Left 03/22/2013   Procedure: LUNG BIOPSY;  Surgeon: Melrose Nakayama, MD;  Location: [facility];  Service: Thoracic;  Laterality: Left;  . POLYPECTOMY    . UPPER GASTROINTESTINAL ENDOSCOPY    . VIDEO ASSISTED THORACOSCOPY Left 03/22/2013   Procedure: VIDEO ASSISTED THORACOSCOPY;  Surgeon: Melrose Nakayama, MD;  [facility];  Service: Thoracic;  Laterality: Left;    Family History  Problem Relation Age of Onset  . Liver cancer Mother   . Diabetes Sister   . Lung cancer Sister   . Heart disease Brother   . Diabetes Brother   . Colon cancer Brother   . Diabetes Unknown   . Cancer Unknown        prostate  . Colon polyps Neg Hx   . Esophageal cancer Neg Hx   . Rectal cancer Neg Hx   . Stomach cancer Neg Hx     No Known Allergies  Current Outpatient Medications on File Prior to Visit  Medication Sig Dispense Refill  .  chlorpheniramine-HYDROcodone (TUSSIONEX PENNKINETIC ER) 10-8 MG/5ML SUER Take 5 mLs by mouth 2 (two) times daily as needed for cough. 140 mL 0  . diclofenac (VOLTAREN) 75 MG EC tablet Take 1 tablet (75 mg total) by mouth 2 (two) times daily. 60 tablet 2  . diphenhydrAMINE (BENADRYL) 25 MG tablet Take 25 mg by mouth every 6 (six) hours as needed for allergies or sleep.    . fluticasone (FLONASE) 50 MCG/ACT nasal spray Place 2 sprays into both nostrils daily. 48 g 5  . gabapentin (NEURONTIN) 100 MG capsule Take 1 capsule (100 mg total) by mouth 3 (three) times daily. 60 capsule 5  . morphine 10 MG/5ML solution Take 2 mLs (4 mg total) by mouth 2 (two) times daily as needed. 120 mL 0  . Nintedanib (OFEV) 150 MG CAPS Take 1 capsule (150 mg total) by mouth 2 (two) times daily. 60 capsule 12  . pantoprazole (PROTONIX) 40 MG tablet Take 1 tablet (40 mg total) by mouth 2 (two) times daily. 180 tablet 3  . ranitidine (ZANTAC) 300 MG tablet TAKE 1 TABLET BY MOUTH AT BEDTIME 90 tablet 1  . zolpidem (AMBIEN) 5 MG tablet Take 1 tablet (5 mg total) by mouth at bedtime as needed for sleep. 30 tablet 5   Current Facility-Administered Medications on File Prior to Visit  Medication Dose Route Frequency Provider Last Rate Last Dose  . 0.9 %  sodium chloride infusion  500 mL Intravenous Continuous Danis, Estill Cotta III, MD        BP 126/60   Temp 98.4 F (36.9 C) (Oral)   Wt 172 lb (78 kg)   BMI 25.40 kg/m       Objective:   Physical Exam  Constitutional: He is oriented to person, place, and time. He appears well-developed and well-nourished. No distress.  Eyes: Pupils are equal, round, and reactive to light. Conjunctivae and EOM are normal.  Red rash under bilateral eyes, worse under left eye.    Neurological: He is alert and oriented to person, place, and time.  Skin: Skin is warm and dry. Rash noted. No purpura noted. Rash is urticarial. Rash is not nodular, not pustular and not vesicular. He is not  diaphoretic. There is erythema. No pallor.  Nursing note and vitals reviewed.  Assessment & Plan:  1. Dermatitis - triamcinolone ointment (KENALOG) 0.5 %; Apply 1 application topically 2 (two) times daily.  Dispense: 30 g; Refill: 0 - Follow up if no improvement   Dorothyann Peng, NP

## 2018-05-16 ENCOUNTER — Ambulatory Visit (INDEPENDENT_AMBULATORY_CARE_PROVIDER_SITE_OTHER): Payer: PPO | Admitting: Internal Medicine

## 2018-05-16 ENCOUNTER — Encounter: Payer: Self-pay | Admitting: Internal Medicine

## 2018-05-16 ENCOUNTER — Other Ambulatory Visit (INDEPENDENT_AMBULATORY_CARE_PROVIDER_SITE_OTHER): Payer: PPO

## 2018-05-16 ENCOUNTER — Ambulatory Visit: Payer: PPO | Admitting: Internal Medicine

## 2018-05-16 VITALS — BP 112/78 | HR 92 | Ht 67.0 in | Wt 173.0 lb

## 2018-05-16 DIAGNOSIS — R05 Cough: Secondary | ICD-10-CM | POA: Diagnosis not present

## 2018-05-16 DIAGNOSIS — Z5181 Encounter for therapeutic drug level monitoring: Secondary | ICD-10-CM

## 2018-05-16 DIAGNOSIS — J209 Acute bronchitis, unspecified: Secondary | ICD-10-CM | POA: Diagnosis not present

## 2018-05-16 DIAGNOSIS — J84112 Idiopathic pulmonary fibrosis: Secondary | ICD-10-CM | POA: Diagnosis not present

## 2018-05-16 DIAGNOSIS — Z7184 Encounter for health counseling related to travel: Secondary | ICD-10-CM

## 2018-05-16 DIAGNOSIS — Z7189 Other specified counseling: Secondary | ICD-10-CM

## 2018-05-16 DIAGNOSIS — R053 Chronic cough: Secondary | ICD-10-CM

## 2018-05-16 LAB — PULMONARY FUNCTION TEST
DL/VA % pred: 110 %
DL/VA: 4.87 ml/min/mmHg/L
DLCO unc % pred: 58 %
DLCO unc: 16.48 ml/min/mmHg
FEF 25-75 Pre: 3.04 L/sec
FEF2575-%Pred-Pre: 141 %
FEV1-%Pred-Pre: 73 %
FEV1-Pre: 2.09 L
FEV1FVC-%Pred-Pre: 120 %
FEV6-%Pred-Pre: 64 %
FEV6-Pre: 2.38 L
FEV6FVC-%Pred-Pre: 106 %
FVC-%Pred-Pre: 60 %
FVC-Pre: 2.38 L
Pre FEV1/FVC ratio: 88 %
Pre FEV6/FVC Ratio: 100 %

## 2018-05-16 LAB — HEPATIC FUNCTION PANEL
ALT: 7 U/L (ref 0–53)
AST: 15 U/L (ref 0–37)
Albumin: 4.1 g/dL (ref 3.5–5.2)
Alkaline Phosphatase: 48 U/L (ref 39–117)
Bilirubin, Direct: 0.1 mg/dL (ref 0.0–0.3)
Total Bilirubin: 0.4 mg/dL (ref 0.2–1.2)
Total Protein: 7.5 g/dL (ref 6.0–8.3)

## 2018-05-16 MED ORDER — CEPHALEXIN 500 MG PO CAPS: 500.0000 mg | ORAL_CAPSULE | Freq: Three times a day (TID) | ORAL | 0 refills | Status: DC

## 2018-05-16 MED ORDER — PREDNISONE 10 MG PO TABS: ORAL_TABLET | ORAL | 0 refills | Status: DC

## 2018-05-16 MED ORDER — HYDROCOD POLST-CPM POLST ER 10-8 MG/5ML PO SUER: 5.0000 mL | Freq: Two times a day (BID) | ORAL | 0 refills | Status: DC | PRN

## 2018-05-16 MED ORDER — DOXYCYCLINE HYCLATE 100 MG PO TABS: 100.0000 mg | ORAL_TABLET | Freq: Two times a day (BID) | ORAL | 0 refills | Status: DC

## 2018-05-16 NOTE — Progress Notes (Signed)
Subjective:     Patient ID: Tanner Bishop, male   DOB: [birth year], 72 y.o.   MRN: [id number]  HPI  #IPF - uip path 03/22/13  - On OFEV since early May 2015   PFT FVC fev1 ratio BD fev1 TLC DLCO Walk test 115f x 3 laps wt rx             Spring 2014 2.9:L L/% /%        June 2015 2.7L/67% 2.34L/78% 86  3.68/57% 20.24/71%   ofev start  [name]  2016 2.5L/55% 2.1L/60% 84/108%    No desat, Pk HR 130    02/11/2015 Screening visit afferent cough study 2.59L/56% 2.24L/63% 87/112%    Dx with afluttter on ekg   Screen failed due to hx of stones  03/26/2015        No desat, PK HR 130    08/26/15 pft machine 2.73L/68% 2.38L/80% 87   [name]     06/29/2016 Office spiro 2.49L/56% 2.14L/65%     Pk HR 90 and lowest pulse ox 99% ->93%  ofev  11/02/2016  office full pft  2.61L/66% 2.3L/79%    17.23/60%   pfev  09/16/2017  2.53L/64%      Pulse ox 99% -> 93%, HR 81- > 84  ofev  12/22/2017        100% -> 97%,  HR 66 -> 96         OV 03/26/2015  Chief Complaint  Patient presents with  . Follow-up    [name], dry cough. CATH procedure on 04/08/15. Denies any chest tightnes/congestion.    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 He is on Ofev after having failed Esbriet in the past due to side effects. He is tolerating Ofev well. Liver function test in March 2016 was normal. I last saw him in January 2016. Subsequently in February 2016 we screened him for a cough idiopathic pulmonary fibrosis study called afferent but he screen failed due to history of renal stones. At this time he was incidentally diagnosed with new onset atrial flutter. He has seen Dr. GCrissie Sicklesand has been started on anticoagulation with Xarelto and metoprolol. He is due to undergo a ablation. He is not aware of the increased bleeding risk with Ofev in the setting of anticoagulation. He assures me that the anti-coag and is only until he undergoes ablation and after that the plan is to stop it. Therefore  only short-term anticoagulation with Xarelto. Overall his effort tolerance is fine. He has postponed his [facility] lung transplant until he completes his ablation.  Walking desaturation test in the office today he did not desaturate. This is stable. Spirometry not done   Past medical history: Atrial flutter new diagnoses as above   OV 06/29/2016  Chief Complaint  Patient presents with  . Follow-up    pt states he is at baseline: sob with exertion, nonprod cough.      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 He is on Ofev . He completed 6 months of PRAISE study (([id number] placebo) in l;ate winter/early spring 2017. This is S[place]vovisit. Overall stable. No worsening dyspnea and cough. Continues daily exercise is walking many miles. Some days he is sitting more than the others. He is interested in more trials and results of the praise study. These results are not out yet. He is compliant with his Ofe last liver function test was in February 2017. There are no new issues. He believes his atrial  fibrillation is in sinus rhythm;    OV  11/02/2016  Chief Complaint  Patient presents with  . Follow-up    4 month IPF follow up and B&A review - does report increased prod cough with yellow mucus, wheezing, tightness in chest, increased SOB, x3-4 weeks with the weather change.  denies any f/c/s, hemoptysis, chest pain   IPF - on ofev. Last liver function test was in July 2017 and normal. Overall dyspnea stable. However he is been having diarrhea with ofev. He called in and we reduced it to 1 tablet a day and this improved the diarrhea. He back on 1 tablet twice a day. The diarrhea has returned although it is much milder. He has never taken any medication for this. Are function test shows slight improvement from July 2017 and similar levels to approximately one year ago  The new issue that he's having significant recurrent sinus infection in the back of chronic postnasal drainage. This since the fall  2017. He says that he and his wife catch respirator infection and passive back and forth to each other. He is having cough, chest tightness, postnasal drip and yellow sputum. It is not affecting any dyspnea. There is no wheeze. There is no fever or hemoptysis.  OV 03/02/2017  Chief Complaint  Patient presents with  . Follow-up    Pt states his breathing is at baseline. Pt c/o dry cough - pt states this is baseline. Pt denies CP/tightness.     Idiopathic pulmonary fibrosis on Ofev. Last seen November 2017. He is a routine follow-up.  He is here with his wife. His sinus infections a result. He is interested in research protocols but we have none right now. He is tolerating his Ofev associated with some mild diarrhea occasional which she takes medication. He is not much of a problem. He tells me that she walks 6 times a week for 5 miles a day. On the treadmill at a 5 incline walking at 3-1/2 miles an hour. For this he feels a little more dyspneic than before. Walking desaturation test 185 feet 3 laps on room air: His pulse ox dropped from 98% at rest and 93% with exertion. His heart rate jumped from 66 at rest and 97 at peak exertion. Features are suggestive of mild progression of the disease. He is not attending pulmonary fibrosis foundation because he felt this was negative but that was years ago. We discussed this and he might be interested again.     OV 09/16/2017  Chief Complaint  Patient presents with  . Follow-up    PFT done today. Pt states that his breathing is gradually getting worse. SOB which depends if it is on exertion vs all the time and c/o prod cough with yellow mucus. Denies any CP   S IPF on fev followup. Overall doing well. He recent bronchitis and liked anabiotic and prednisone. He felt the prednisone helped his cough just currently rated as mild to moderate in severity. Helped the shortness of breath. He also helped arthralgia. He is now wondering what taking chronic  prednisone. We had an extensive discussion about this. Spirometry shows that the FVC stable compared to earlier this year but slightly progressive compared to one year ago. [facility] interstitial lung disease questionnaire shows symptoms. His wife is here with him. He is interested in research trials. He has participated in distress before. He is asking about opioid refill for cough if I wont do prednisone.   OV 12/22/2017  Chief Complaint  Patient presents with  . Follow-up    Pt states he believes his breathing is at a stable point right now. States that he is coughing which is worse when he first wakes up and then also at night; occ will cough up yellow phlegm.     C.o ofev intolerance with fatigue, weight loss, diarrhea. Does not like lomotil. Wnaant to give it a holiday foir 2-3 weeks. INterested in research protocol - discussed [place]. 5 year cut off since diagnosis coming up 03/22/18. He is overall frustrated ith qualtiy of life. Walks 5 miles; starts feeling dizzy > 3 miles in but not checking pulse ox despite advice. Walking desaturation test on 12/22/2017 185 feet x 3 laps on ROOM AIR:  did noit desaturate. Rest pulse ox was 100%, final pulse ox was 97%. HR response 66/min at rest to 96/min at peak exertion. Patient Tanner Bishop  Did not Desaturate < 88% . Tanner Bishop yes  Desaturated </= 3% points. Tanner Bishop yes did get tachyardic     Walking desaturation test on 02/01/2018 185 feet x 3 laps on ROOM AIR:  did not desaturate. Rest pulse ox was 100%, final pulse ox was 91%. HR response 67/min at rest to 85/min at peak exertion. Patient Tanner Bishop  dod not Desaturate < 88% . Tanner Bishop yes diod  Desaturated </= 3% points. Tanner Bishop did not get tachyardic   OV 02/01/2018  Chief Complaint  Patient presents with  . Follow-up    Pt states he has been doing good since last visit. Started back on OFEV 12/24/17 after taking a holiday off of it.     FU IPF  Took ofev holiday.  And then restarted 01/24/18 and doing well. [place] ofev ok. KBILD ok.   OV 03/16/2018'  . Chief Complaint  Patient presents with  . Follow-up    PFT done today. States he has been doing well and denies any current complaints. Tolerating OFEV well and has been walking about 5 miles every day with no complaints.   Ms. Waage returns for follow-up of idiopathic pulmonary fibrosis.  He is now back on nintedanib after giving it a holiday for a few weeks.  He is tolerating it well and no problems.  He goes for daily walks and has no problems.  His lung function was reviewed today and it shows for the first time a significant decline of greater than 200 cc between 2 time points.  This correlates with him not taking over for a few weeks nevertheless overall he is happy with his health.  He is not interested in research trials anymore.  He has a new question about travel advice encounter.  He plans an [place] from [place], [place] intra[place]junal in [place] for 10 days.  This will start on May 22, 2018.  He wants to make sure it is safe for him to go.  He said he has never been on a cruise and he and his wife have a strong desire to go as part of his goals of care.   OV 05/16/2018  Chief Complaint  Patient presents with  . Follow-up    Breathing is unchanged since last OV.    Tanner Bishop , 72 y.o. , with dob 08/17/1946 and male ,Not Hispanic or Latino from [address] [place] [address] - presents to pulm ILD clinic for IPF.  He presents with his wife.  And just under 7 days he is going to embark on a  [place].  This visit is precautionary visit just before that.  Most recent visit was in April 2019.  At this point in time in terms of his IPF he feels stable.  He is compliant with full dose of 5 and is having only mild diarrhea.  Other than that he is tolerating it just fine.  He is looking forward to his cruise.  He had pulmonary function test today that shows an  improvement compared to last visit and it is very similar to summer/fall 2018 in terms of FVC and DLCO.  Although he does feel like he is coming down with an acute bronchitis and wants to take some antibiotic and prednisone I had of the trip.  We also discussed about him having prophylactic antibiotics handy.  He wants a refill on his Tussionex for cough.  At this point in time he is past 5 years of diagnosis and not eligible for research trials he is not interested in transplant although the recent pulmonary fibrosis foundation meeting he did hear from a lot of transplant patients about the individual personal experience.     K-BILD ILD QUESTIONNAIRE, Symptom score over prior 2 weeks  7-none, 6-rarely, 5-occ, 5-some times, 3-sev times, 2-most times, 1-every time 09/16/2017  12/22/2017  02/01/2018   Dyspnea for stairs, incline or hill _0 Chest Tightness _1 Worry about seriousness of lung complaint _2 Avoided doing things that make you dyspneic _3 Have you felt loss of control of lung condition (reversed from original) _4 Felt fed up due to lung condition _5 Felt urge to breathe aka air hunger _6 Has lung condition made you feel anxious _7 How often have you experienced wheezing or whistling sound _8 How much of the time have you felt your lung dz is getting worse _9 How much has your lung condition interfered with job or daily task _10 Were you expecting your lung condition to get worse _11 How much has your lung function limited you carrying things like groceris _12 How much has your lung function made you think of EOL? _13 Total     Are you financially worse off _14 Grand Total        Results for Tanner Bishop, Tanner Bishop (MRN [id number]) as of 09/16/2017 09:43  Ref. Range 05/14/2014 15:46 07/24/2015 12:43 08/26/2015 10:52 11/02/2016 09:51 05/19/2017 08:41 09/16/2017 08:47 03/16/17 05/16/2018   FVC-Pre Latest Units: L 2.71 2.69 2.73 2.61 2.51 2.53 2.29  2.38  FVC-%Pred-Pre Latest Units: % 67 67 68 66 63 64 58 60   Results for Tanner Bishop, Tanner Bishop (MRN [id number]) as of 09/16/2017 09:43  Ref. Range 05/14/2014 15:46 07/24/2015 12:43 08/26/2015 10:52 11/02/2016 09:51 05/19/2017 08:41 09/16/2017 08:47 03/16/2018  05/16/2018   DLCO unc Latest Units: ml/min/mmHg 20.24 15.36 15.36 17.23 15.83  13.14 16.48  DLCO unc % pred Latest Units: % 71 54 54 60 55  46 58       has a past medical history of Allergy, Arthritis, Atrial flutter ([place]), Cataract, GERD (gastroesophageal reflux disease), H/O hiatal hernia, Hyperlipidemia, Injury of right hand, Pulmonary fibrosis ([place]), Renal stone (06/2014), and Ulcer.   reports that he quit smoking about 39 years ago. His smoking use included cigarettes. He has a 16.00 pack-year smoking history. He has  never used smokeless tobacco.  Past Surgical History:  Procedure Laterality Date  . ATRIAL FLUTTER ABLATION N/A 04/08/2015   Procedure: ATRIAL FLUTTER ABLATION;  Surgeon: Evans Lance, MD;  Location: [facility] CATH LAB;  Service: Cardiovascular;  Laterality: N/A;  . CATARACT EXTRACTION, BILATERAL  2016  . COLONOSCOPY  03/01/2017   per Dr. Loletha Carrow, clear, repeat in 5 yrs (brother had colon cancer)   . ESOPHAGOGASTRODUODENOSCOPY  2007  . EXTRACORPOREAL SHOCK WAVE LITHOTRIPSY  06-17-14   per Dr. Jeffie Pollock   . HAND SURGERY Right   . LUNG BIOPSY Left 03/22/2013   Procedure: LUNG BIOPSY;  Surgeon: Melrose Nakayama, MD;  Location: [facility];  Service: Thoracic;  Laterality: Left;  . POLYPECTOMY    . UPPER GASTROINTESTINAL ENDOSCOPY    . VIDEO ASSISTED THORACOSCOPY Left 03/22/2013   Procedure: VIDEO ASSISTED THORACOSCOPY;  Surgeon: Melrose Nakayama, MD;  Location: [facility];  Service: Thoracic;  Laterality: Left;    No Known Allergies  Immunization History  Administered Date(s) Administered  . Influenza Split 09/13/2013  . Influenza, High Dose Seasonal PF 10/06/2016, 09/16/2017  . Influenza-Unspecified 09/07/2014, 09/14/2015  . Pneumococcal  Conjugate-13 02/16/2017  . Pneumococcal Polysaccharide-23 05/26/2013  . Td 12/15/2007  . Tdap 02/23/2018  . Zoster 02/19/2014    Family History  Problem Relation Age of Onset  . Liver cancer Mother   . Diabetes Sister   . Lung cancer Sister   . Heart disease Brother   . Diabetes Brother   . Colon cancer Brother   . Diabetes Unknown   . Cancer Unknown        prostate  . Colon polyps Neg Hx   . Esophageal cancer Neg Hx   . Rectal cancer Neg Hx   . Stomach cancer Neg Hx      Current Outpatient Medications:  .  chlorpheniramine-HYDROcodone (TUSSIONEX PENNKINETIC ER) 10-8 MG/5ML SUER, Take 5 mLs by mouth 2 (two) times daily as needed for cough., Disp: 140 mL, Rfl: 0 .  diclofenac (VOLTAREN) 75 MG EC tablet, Take 1 tablet (75 mg total) by mouth 2 (two) times daily., Disp: 60 tablet, Rfl: 2 .  diphenhydrAMINE (BENADRYL) 25 MG tablet, Take 25 mg by mouth every 6 (six) hours as needed for allergies or sleep., Disp: , Rfl:  .  fluticasone (FLONASE) 50 MCG/ACT nasal spray, Place 2 sprays into both nostrils daily., Disp: 48 g, Rfl: 5 .  morphine 10 MG/5ML solution, Take 2 mLs (4 mg total) by mouth 2 (two) times daily as needed., Disp: 120 mL, Rfl: 0 .  Nintedanib (OFEV) 150 MG CAPS, Take 1 capsule (150 mg total) by mouth 2 (two) times daily., Disp: 60 capsule, Rfl: 12 .  pantoprazole (PROTONIX) 40 MG tablet, Take 1 tablet (40 mg total) by mouth 2 (two) times daily., Disp: 180 tablet, Rfl: 3 .  triamcinolone ointment (KENALOG) 0.5 %, Apply 1 application topically 2 (two) times daily., Disp: 30 g, Rfl: 0 .  zolpidem (AMBIEN) 5 MG tablet, Take 1 tablet (5 mg total) by mouth at bedtime as needed for sleep., Disp: 30 tablet, Rfl: 5  Current Facility-Administered Medications:  .  0.9 %  sodium chloride infusion, 500 mL, Intravenous, Continuous, Danis, Kirke Corin, MD   Review of Systems     Objective:   Physical Exam  Constitutional: He is oriented to person, place, and time. He appears  well-developed and well-nourished. No distress.  HENT:  Head: Normocephalic and atraumatic.  Right Ear: External ear normal.  Left Ear: External ear normal.  Mouth/Throat: Oropharynx is clear and moist. No oropharyngeal exudate.  Eyes: Pupils are equal, round, and reactive to light. Conjunctivae and EOM are normal. Right eye exhibits no discharge. Left eye exhibits no discharge. No scleral icterus.  Neck: Normal range of motion. Neck supple. No JVD present. No tracheal deviation present. No thyromegaly present.  Cardiovascular: Normal rate, regular rhythm and intact distal pulses. Exam reveals no gallop and no friction rub.  No murmur heard. Pulmonary/Chest: Effort normal. No respiratory distress. He has no wheezes. He has rales. He exhibits no tenderness.  Crackles at base  Abdominal: Soft. Bowel sounds are normal. He exhibits no distension and no mass. There is no tenderness. There is no rebound and no guarding.  Musculoskeletal: Normal range of motion. He exhibits no edema or tenderness.  Lymphadenopathy:    He has no cervical adenopathy.  Neurological: He is alert and oriented to person, place, and time. He has normal reflexes. No cranial nerve deficit. Coordination normal.  Skin: Skin is warm and dry. No rash noted. He is not diaphoretic. No erythema. No pallor.  Psychiatric: He has a normal mood and affect. His behavior is normal. Judgment and thought content normal.  Nursing note and vitals reviewed.    Vitals:   05/16/18 1106  BP: 112/78  Pulse: 92  SpO2: 94%  Weight: 173 lb (78.5 kg)  Height: 5' 7" (1.702 m)        Assessment:       ICD-10-CM   1. IPF (idiopathic pulmonary fibrosis) ([place]) J84.112   2. Encounter for therapeutic drug monitoring Z51.81   3. Chronic cough R05   4. Travel advice encounter Z71.89   5. Acute bronchitis, unspecified organism J20.9        Plan:     IPF (idiopathic pulmonary fibrosis) ([place]) Encounter for therapeutic drug monitoring  -  15% lung function decline over 5 year but 05/16/2018 - is similar to summer/fall 2018 - continue ofev at current dose  - check LFT 05/16/2018   Chronic cough - refill tussionex  Travel advice encounter - for ship - take handy Please take prednisone 40 mg x1 day, then 30 mg x1 day, then 20 mg x1 day, then 10 mg x1 day, and then 5 mg x1 day and stop AND cephalexin 573m three times daily x  5 days - if you need to reach me - my email  - [email or web address]   Acute bronchitis, unspecified organism - you have currently - Take doxycycline 1038mpo twice daily x 5 days; take after meals and avoid sunlight - Please take prednisone 40 mg x1 day, then 30 mg x1 day, then 20 mg x1 day, then 10 mg x1 day, and then 5 mg x1 day and stop  folllowup  - 5-6 months do Pre-bd spiro only. No DLCO No lung volume or bd response. No post-bd spiro - return to see me in ILD clinic in 5-6 months    Dr. MuBrand MalesM.D., F.[facility].P Pulmonary and Critical Care Medicine Staff Physician, CoGoodyearirector - Interstitial Lung Disease  Program  Pulmonary Fi[id number]: [phone] no answer or between  15:00h - 7:00h: call [phone] Telephone: [phone]

## 2018-05-16 NOTE — Patient Instructions (Addendum)
IPF (idiopathic pulmonary fibrosis) ([place]) Encounter for therapeutic drug monitoring  - 15% lung function decline over 5 year but 05/16/2018 - is similar to summer/fall 2018 - continue ofev at current dose  - check LFT 05/16/2018   Chronic cough - refill tussionex  Travel advice encounter - for ship - take handy Please take prednisone 40 mg x1 day, then 30 mg x1 day, then 20 mg x1 day, then 10 mg x1 day, and then 5 mg x1 day and stop AND cephalexin 565m three times daily x  5 days - if you need to reach me - my email  - [email or web address]   Acute bronchitis, unspecified organism - you have currently - Take doxycycline 1025mpo twice daily x 5 days; take after meals and avoid sunlight - Please take prednisone 40 mg x1 day, then 30 mg x1 day, then 20 mg x1 day, then 10 mg x1 day, and then 5 mg x1 day and stop  folllowup  - 5-6 months do Pre-bd spiro only. No DLCO No lung volume or bd response. No post-bd spiro - return to see me in ILD clinic in 5-6 months

## 2018-05-16 NOTE — Progress Notes (Signed)
PFT completed today.  

## 2018-05-17 ENCOUNTER — Telehealth: Payer: Self-pay | Admitting: Internal Medicine

## 2018-05-17 NOTE — Telephone Encounter (Signed)
Notes recorded by Pinion, [name], CMA on 05/17/2018 at 9:18 AM EDT Attempted to call pt but no answer. Left message for pt to return call x1 ------  Notes recorded by Brand Males, MD on 05/17/2018 at 8:19 AM EDT LFT normal. From now we can do every 6 months/3 months   Pt aware of results and recs. Nothing more needed at this time.

## 2018-06-05 ENCOUNTER — Encounter: Payer: Self-pay | Admitting: Internal Medicine

## 2018-07-12 ENCOUNTER — Telehealth: Payer: Self-pay | Admitting: *Deleted

## 2018-07-12 NOTE — Telephone Encounter (Signed)
Letter has been composed and will be mailed to the patient. Patient was enrolled in Fibrogen Phase 2 Study [phone] and did receive the medication, Pamrevlumab during this study. Letter has been composed and mailed to the patient  to make them aware. Will forward phone note to MR to sign off of note.

## 2018-07-14 NOTE — Telephone Encounter (Signed)
Documentation Correction:  This note is to provide a correction to documentation made on 07/12/2018. This subject was nerolled in the Fibrogen FGCL-3019-067 study, not into the Fibrogen FGCL-3019-091 study.   Wilton Bing, [place]

## 2018-08-18 ENCOUNTER — Encounter: Payer: Self-pay | Admitting: Nurse Practitioner

## 2018-08-18 ENCOUNTER — Ambulatory Visit: Payer: PPO | Admitting: Nurse Practitioner

## 2018-08-18 VITALS — BP 124/82 | HR 77 | Ht 67.0 in | Wt 169.0 lb

## 2018-08-18 DIAGNOSIS — R0600 Dyspnea, unspecified: Secondary | ICD-10-CM | POA: Diagnosis not present

## 2018-08-18 DIAGNOSIS — I499 Cardiac arrhythmia, unspecified: Secondary | ICD-10-CM | POA: Diagnosis not present

## 2018-08-18 NOTE — Assessment & Plan Note (Signed)
Patient Instructions  Will place referral to cardiology - abnormal EKG frequent PAC's and incomplete right bundle branch block. Continue current medications Follow up with [name] in 1 month No strenuous exercise until seen by cardiology

## 2018-08-18 NOTE — Patient Instructions (Addendum)
Will place referral to cardiology - abnormal EKG frequent PAC's and incomplete right bundle branch block. Continue current medications Follow up with [name] in 1 month No strenuous exercise until seen by cardiology

## 2018-08-18 NOTE — Progress Notes (Signed)
_0  ID: Tanner Bishop, male    DOB: [birth year], 72 y.o.   MRN: [id number]  Chief Complaint  Patient presents with  . Shortness of Breath    Was exercising and concerned because heart rate was at 145 - 147 and O2 was 90%    Referring provider: [name], MD   HPI  72 year old male with idiopathic pulmonary fibrosis followed by [name].  Tests:   OV 08/18/18 - Acute Patient presents today for shortness of breath. He states that he was working out on the [employer] and his heart rate was high (140 BPM). He became short of breath and stopped. He states that he has felt better since yesterday, but has noticed that he has been more fatigued over the past few days. He finds it harder to climb a flight of stairs. He states that he feels like his heart could be out of rhythm. Has had a-flutter in the past. Denies any fever, denies any chest pain or edema. He is compliant with medications. Has not seen a cardiologist since 2016.   EKG: RBBB, PAC's noted (new from last EKG).    No Known Allergies  Immunization History  Administered Date(s) Administered  . Influenza Split 09/13/2013  . Influenza, High Dose Seasonal PF 10/06/2016, 09/16/2017  . Influenza-Unspecified 09/07/2014, 09/14/2015  . Pneumococcal Conjugate-13 02/16/2017  . Pneumococcal Polysaccharide-23 05/26/2013  . Td 12/15/2007  . Tdap 02/23/2018  . Zoster 02/19/2014    Past Medical History:  Diagnosis Date  . Allergy   . Arthritis   . Atrial flutter ([place])    a. s/p ablation 03/2015 Dr Lovena Le  . Cataract    removed both eyes  . GERD (gastroesophageal reflux disease)   . H/O hiatal hernia   . Hyperlipidemia   . Injury of right hand    permanent damage after workplace 2009 and 2010 Dr Vernona Rieger [facility]  . Pulmonary fibrosis ([place])    sees Dr. Onnie Graham   . Renal stone 06/2014  . Ulcer    unresolved    Tobacco History: Social History   Tobacco Use  Smoking Status Former  Smoker  . Packs/day: 1.00  . Years: 16.00  . Pack years: 16.00  . Types: Cigarettes  . Last attempt to quit: 12/14/1978  . Years since quitting: 39.7  Smokeless Tobacco Never Used   Counseling given: Yes   Outpatient Encounter Medications as of 08/18/2018  Medication Sig  . chlorpheniramine-HYDROcodone (TUSSIONEX PENNKINETIC ER) 10-8 MG/5ML SUER Take 5 mLs by mouth 2 (two) times daily as needed for cough.  . diphenhydrAMINE (BENADRYL) 25 MG tablet Take 25 mg by mouth every 6 (six) hours as needed for allergies or sleep.  . fluticasone (FLONASE) 50 MCG/ACT nasal spray Place 2 sprays into both nostrils daily.  Marland Kitchen morphine 10 MG/5ML solution Take 2 mLs (4 mg total) by mouth 2 (two) times daily as needed.  . Nintedanib (OFEV) 150 MG CAPS Take 1 capsule (150 mg total) by mouth 2 (two) times daily.  . pantoprazole (PROTONIX) 40 MG tablet Take 1 tablet (40 mg total) by mouth 2 (two) times daily.  Marland Kitchen zolpidem (AMBIEN) 5 MG tablet Take 1 tablet (5 mg total) by mouth at bedtime as needed for sleep.  . [DISCONTINUED] cephALEXin (KEFLEX) 500 MG capsule Take 1 capsule (500 mg total) by mouth 3 (three) times daily. (Patient not taking: Reported on 08/18/2018)  . [DISCONTINUED] diclofenac (VOLTAREN) 75 MG EC tablet Take 1 tablet (75 mg  total) by mouth 2 (two) times daily. (Patient not taking: Reported on 08/18/2018)  . [DISCONTINUED] doxycycline (VIBRA-TABS) 100 MG tablet Take 1 tablet (100 mg total) by mouth 2 (two) times daily. (Patient not taking: Reported on 08/18/2018)  . [DISCONTINUED] predniSONE (DELTASONE) 10 MG tablet Take 66mx1day,30mgx1day,20mgx1day,10mgx1day,5mgx1day,then stop (Patient not taking: Reported on 08/18/2018)  . [DISCONTINUED] predniSONE (DELTASONE) 10 MG tablet Take 429m1day,30mgx1day,20mgx1day,10mgx1day,5mgx1day,then stop (Patient not taking: Reported on 08/18/2018)  . [DISCONTINUED] triamcinolone ointment (KENALOG) 0.5 % Apply 1 application topically 2 (two) times daily. (Patient not taking:  Reported on 08/18/2018)   Facility-Administered Encounter Medications as of 08/18/2018  Medication  . 0.9 %  sodium chloride infusion     Review of Systems  Review of Systems  Constitutional: Negative for fever.  Respiratory: Positive for shortness of breath. Negative for chest tightness and wheezing.   Cardiovascular: Negative for chest pain, palpitations and leg swelling.       Physical Exam  BP 124/82 (BP Location: Left Arm, Patient Position: Sitting, Cuff Size: Normal)   Pulse 77   Ht 5' 7" (1.702 m)   Wt 169 lb (76.7 kg)   SpO2 97%   BMI 26.47 kg/m   Wt Readings from Last 5 Encounters:  08/18/18 169 lb (76.7 kg)  05/16/18 173 lb (78.5 kg)  04/14/18 172 lb (78 kg)  03/16/18 174 lb (78.9 kg)  02/23/18 166 lb 7 oz (75.5 kg)     Physical Exam  Constitutional: He is oriented to person, place, and time. He appears well-developed and well-nourished. No distress.  Cardiovascular: Normal rate. An irregular rhythm present.  Pulmonary/Chest: Effort normal and breath sounds normal.  Neurological: He is alert and oriented to person, place, and time.  Skin: Skin is warm and dry.  Psychiatric: He has a normal mood and affect.  Nursing note and vitals reviewed.     Assessment & Plan:   Irregular heart rhythm Patient Instructions  Will place referral to cardiology - abnormal EKG frequent PAC's and incomplete right bundle branch block. Continue current medications Follow up with Dr. RaChase Callern 1 month No strenuous exercise until seen by cardiology     Dyspnea Patient Instructions  Will place referral to cardiology - abnormal EKG frequent PAC's and incomplete right bundle branch block. Continue current medications Follow up with Dr. RaChase Callern 1 month No strenuous exercise until seen by cardiology        ToFenton FoyNP 08/18/2018

## 2018-08-23 ENCOUNTER — Encounter: Payer: Self-pay | Admitting: Internal Medicine

## 2018-08-23 ENCOUNTER — Ambulatory Visit: Payer: PPO | Admitting: Internal Medicine

## 2018-08-23 VITALS — BP 124/70 | HR 72 | Ht 68.0 in | Wt 172.2 lb

## 2018-08-23 DIAGNOSIS — R0602 Shortness of breath: Secondary | ICD-10-CM | POA: Diagnosis not present

## 2018-08-23 DIAGNOSIS — R5383 Other fatigue: Secondary | ICD-10-CM

## 2018-08-23 DIAGNOSIS — I499 Cardiac arrhythmia, unspecified: Secondary | ICD-10-CM | POA: Diagnosis not present

## 2018-08-23 MED ORDER — FLECAINIDE ACETATE 50 MG PO TABS: 50.0000 mg | ORAL_TABLET | Freq: Two times a day (BID) | ORAL | 11 refills | Status: DC

## 2018-08-23 NOTE — Progress Notes (Signed)
HPI Tanner Bishop returns today after a long absence from our arrhythmia clinic. He underwent EP study and ablation of atrial flutter 3 years ago. He has pulmonary fibrosis but has not had much progression. He is not on oxygen and his sats have been good. He was seen in clinic a week ago and noted palpitations, fatigue and chest pressure. The pressure is non-exertional. He has not had syncope. No peripheral edema. No Known Allergies   Current Outpatient Medications  Medication Sig Dispense Refill  . chlorpheniramine-HYDROcodone (TUSSIONEX PENNKINETIC ER) 10-8 MG/5ML SUER Take 5 mLs by mouth 2 (two) times daily as needed for cough. 140 mL 0  . diphenhydrAMINE (BENADRYL) 25 MG tablet Take 25 mg by mouth every 6 (six) hours as needed for allergies or sleep.    . fluticasone (FLONASE) 50 MCG/ACT nasal spray Place 2 sprays into both nostrils daily. 48 g 5  . morphine 10 MG/5ML solution Take 2 mLs (4 mg total) by mouth 2 (two) times daily as needed. 120 mL 0  . Nintedanib (OFEV) 150 MG CAPS Take 1 capsule (150 mg total) by mouth 2 (two) times daily. 60 capsule 12  . pantoprazole (PROTONIX) 40 MG tablet Take 1 tablet (40 mg total) by mouth 2 (two) times daily. 180 tablet 3  . zolpidem (AMBIEN) 5 MG tablet Take 1 tablet (5 mg total) by mouth at bedtime as needed for sleep. 30 tablet 5   Current Facility-Administered Medications  Medication Dose Route Frequency Provider Last Rate Last Dose  . 0.9 %  sodium chloride infusion  500 mL Intravenous Continuous Tanner Stabler, MD         Past Medical History:  Diagnosis Date  . Allergy   . Arthritis   . Atrial flutter (Tanner Bishop)    a. s/p ablation 03/2015 Dr Tanner Bishop  . Cataract    removed both eyes  . GERD (gastroesophageal reflux disease)   . H/O hiatal hernia   . Hyperlipidemia   . Injury of right hand    permanent damage after workplace 2009 and 2010 Dr Tanner Bishop [facility]  . Pulmonary fibrosis ([place])    sees Dr. Onnie Bishop   .  Renal stone 06/2014  . Ulcer    unresolved    ROS:   All systems reviewed and negative except as noted in the HPI.   Past Surgical History:  Procedure Laterality Date  . ATRIAL FLUTTER ABLATION N/A 04/08/2015   Procedure: ATRIAL FLUTTER ABLATION;  Surgeon: Tanner Lance, MD;  Location: [facility] CATH LAB;  Service: Cardiovascular;  Laterality: N/A;  . CATARACT EXTRACTION, BILATERAL  2016  . COLONOSCOPY  03/01/2017   per Dr. Loletha Carrow, clear, repeat in 5 yrs (brother had colon cancer)   . ESOPHAGOGASTRODUODENOSCOPY  2007  . EXTRACORPOREAL SHOCK WAVE LITHOTRIPSY  06-17-14   per Dr. Jeffie Pollock   . HAND SURGERY Right   . LUNG BIOPSY Left 03/22/2013   Procedure: LUNG BIOPSY;  Surgeon: Melrose Nakayama, MD;  Location: [facility];  Service: Thoracic;  Laterality: Left;  . POLYPECTOMY    . UPPER GASTROINTESTINAL ENDOSCOPY    . VIDEO ASSISTED THORACOSCOPY Left 03/22/2013   Procedure: VIDEO ASSISTED THORACOSCOPY;  Surgeon: Melrose Nakayama, MD;  Location: [facility] OR;  Service: Thoracic;  Laterality: Left;     Family History  Problem Relation Age of Onset  . Liver cancer Mother   . Diabetes Sister   . Lung cancer Sister   . Heart disease Brother   .  Diabetes Brother   . Colon cancer Brother   . Diabetes Unknown   . Cancer Unknown        prostate  . Colon polyps Neg Hx   . Esophageal cancer Neg Hx   . Rectal cancer Neg Hx   . Stomach cancer Neg Hx      Social History   Socioeconomic History  . Marital status: Married    Spouse name: Not on file  . Number of children: Not on file  . Years of education: Not on file  . Highest education level: Not on file  Occupational History  . 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 Occupation: Disabled   Social Needs  . Financial resource strain: Not on file  . Food insecurity:    Worry: Not on file    Inability: Not on file  . Transportation needs:    Medical: Not on file    Non-medical: Not on file  Tobacco Use  . Smoking status:  Former Smoker    Packs/day: 1.00    Years: 16.00    Pack years: 16.00    Types: Cigarettes    Last attempt to quit: 12/14/1978    Years since quitting: 39.7  . Smokeless tobacco: Never Used  Substance and Sexual Activity  . Alcohol use: No    Alcohol/week: 0.0 standard drinks  . Drug use: No  . Sexual activity: Not on file  Lifestyle  . Physical activity:    Days per week: Not on file    Minutes per session: Not on file  . Stress: Not on file  Relationships  . Social connections:    Talks on phone: Not on file    Gets together: Not on file    Attends religious service: Not on file    Active member of club or organization: Not on file    Attends meetings of clubs or organizations: Not on file    Relationship status: Not on file  . Intimate partner violence:    Fear of current or ex partner: Not on file    Emotionally abused: Not on file    Physically abused: Not on file    Forced sexual activity: Not on file  Other Topics Concern  . Not on file  Social History Narrative  . Not on file     BP 124/70   Pulse 72   Ht _0  (1.727 m)   Wt 172 lb 3.2 oz (78.1 kg)   SpO2 97%   BMI 26.18 kg/m   Physical Exam:  Well appearing 72 yo man, NAD HEENT: Unremarkable Neck:  6 cm JVD, no thyromegally Lymphatics:  No adenopathy Back:  No CVA tenderness Lungs:  Clear with no wheezes HEART:  Regular rate rhythm, no murmurs, no rubs, no clicks Abd:  soft, positive bowel sounds, no organomegally, no rebound, no guarding Ext:  2 plus pulses, no edema, no cyanosis, no clubbing Skin:  No rashes no nodules Neuro:  CN II through XII intact, motor grossly intact  EKG - reviewed. NSR with frequent PACs.   Assess/Plan: 1. Palpitations - I have reviewed the mechanism of his arrhythmia. He does not have any documented atrial fib. He will try low dose flecainide.  2. Atrial flutter - he has had no evidence of recurrence. 3. Pulmonary fibrosis - I cannot help but wonder if his symptoms  are not related to his fibrosis. When he returns we will have him walk with the pulse oximeter.  Tanner Bishop.D

## 2018-08-23 NOTE — Patient Instructions (Addendum)
Medication Instructions:  Your physician has recommended you make the following change in your medication:   1.  Start taking flecainide 50 mg one tablet by mouth twice a day.  Labwork: None ordered.  Testing/Procedures: None ordered.  Follow-Up: Your physician wants you to follow-up in: 4 weeks with [name].     Any Other Special Instructions Will Be Listed Below (If Applicable).  If you need a refill on your cardiac medications before your next appointment, please call your pharmacy.    Flecainide tablets What is this medicine? FLECAINIDE (FLEK a nide) is an antiarrhythmic drug. This medicine is used to prevent irregular heart rhythm. It can also slow down fast heartbeats called tachycardia. This medicine may be used for other purposes; ask your health care provider or pharmacist if you have questions. COMMON BRAND NAME(S): Tambocor What should I tell my health care provider before I take this medicine? They need to know if you have any of these conditions: -abnormal levels of potassium in the blood -heart disease including heart rhythm and heart rate problems -kidney or liver disease -recent heart attack -an unusual or allergic reaction to flecainide, local anesthetics, other medicines, foods, dyes, or preservatives -pregnant or trying to get pregnant -breast-feeding How should I use this medicine? Take this medicine by mouth with a glass of water. Follow the directions on the prescription label. You can take this medicine with or without food. Take your doses at regular intervals. Do not take your medicine more often than directed. Do not stop taking this medicine suddenly. This may cause serious, heart-related side effects. If your doctor wants you to stop the medicine, the dose may be slowly lowered over time to avoid any side effects. Talk to your pediatrician regarding the use of this medicine in children. While this drug may be prescribed for children as young as 1  year of age for selected conditions, precautions do apply. Overdosage: If you think you have taken too much of this medicine contact a poison control center or emergency room at once. NOTE: This medicine is only for you. Do not share this medicine with others. What if I miss a dose? If you miss a dose, take it as soon as you can. If it is almost time for your next dose, take only that dose. Do not take double or extra doses. What may interact with this medicine? Do not take this medicine with any of the following medications: -amoxapine -arsenic trioxide -certain antibiotics like clarithromycin, erythromycin, gatifloxacin, gemifloxacin, levofloxacin, moxifloxacin, sparfloxacin, or troleandomycin -certain antidepressants called tricyclic antidepressants like amitriptyline, imipramine, or nortriptyline -certain medicines to control heart rhythm like disopyramide, dofetilide, encainide, moricizine, procainamide, propafenone, and quinidine -cisapride -cyclobenzaprine -delavirdine -droperidol -haloperidol -hawthorn -imatinib -levomethadyl -maprotiline -medicines for malaria like chloroquine and halofantrine -pentamidine -phenothiazines like chlorpromazine, mesoridazine, prochlorperazine, thioridazine -pimozide -quinine -ranolazine -ritonavir -sertindole -ziprasidone This medicine may also interact with the following medications: -cimetidine -medicines for angina or high blood pressure -medicines to control heart rhythm like amiodarone and digoxin This list may not describe all possible interactions. Give your health care provider a list of all the medicines, herbs, non-prescription drugs, or dietary supplements you use. Also tell them if you smoke, drink alcohol, or use illegal drugs. Some items may interact with your medicine. What should I watch for while using this medicine? Visit your doctor or health care professional for regular checks on your progress. Because your condition and  the use of this medicine carries some risk, it is a good  idea to carry an identification card, necklace or bracelet with details of your condition, medications and doctor or health care professional. Check your blood pressure and pulse rate regularly. Ask your health care professional what your blood pressure and pulse rate should be, and when you should contact him or her. Your doctor or health care professional also may schedule regular blood tests and electrocardiograms to check your progress. You may get drowsy or dizzy. Do not drive, use machinery, or do anything that needs mental alertness until you know how this medicine affects you. Do not stand or sit up quickly, especially if you are an older patient. This reduces the risk of dizzy or fainting spells. Alcohol can make you more dizzy, increase flushing and rapid heartbeats. Avoid alcoholic drinks. What side effects may I notice from receiving this medicine? Side effects that you should report to your doctor or health care professional as soon as possible: -chest pain, continued irregular heartbeats -difficulty breathing -swelling of the legs or feet -trembling, shaking -unusually weak or tired Side effects that usually do not require medical attention (report to your doctor or health care professional if they continue or are bothersome): -blurred vision -constipation -headache -nausea, vomiting -stomach pain This list may not describe all possible side effects. Call your doctor for medical advice about side effects. You may report side effects to FDA at 1-800-FDA-1088. Where should I keep my medicine? Keep out of the reach of children. Store at room temperature between 15 and 30 degrees C (59 and 86 degrees F). Protect from light. Keep container tightly closed. Throw away any unused medicine after the expiration date. NOTE: This sheet is a summary. It may not cover all possible information. If you have questions about this medicine, talk  to your doctor, pharmacist, or health care provider.  2018 Elsevier/Gold Standard (2008-04-04 16:46:09)

## 2018-08-26 ENCOUNTER — Ambulatory Visit: Payer: PPO | Admitting: Internal Medicine

## 2018-09-19 ENCOUNTER — Encounter: Payer: Self-pay | Admitting: Internal Medicine

## 2018-09-19 ENCOUNTER — Other Ambulatory Visit (INDEPENDENT_AMBULATORY_CARE_PROVIDER_SITE_OTHER): Payer: PPO

## 2018-09-19 ENCOUNTER — Ambulatory Visit: Payer: PPO | Admitting: Internal Medicine

## 2018-09-19 VITALS — BP 112/70 | HR 75 | Ht 68.0 in | Wt 171.6 lb

## 2018-09-19 DIAGNOSIS — I499 Cardiac arrhythmia, unspecified: Secondary | ICD-10-CM | POA: Diagnosis not present

## 2018-09-19 DIAGNOSIS — Z5181 Encounter for therapeutic drug level monitoring: Secondary | ICD-10-CM

## 2018-09-19 DIAGNOSIS — J84112 Idiopathic pulmonary fibrosis: Secondary | ICD-10-CM

## 2018-09-19 LAB — HEPATIC FUNCTION PANEL
ALT: 11 U/L (ref 0–53)
AST: 20 U/L (ref 0–37)
Albumin: 4.1 g/dL (ref 3.5–5.2)
Alkaline Phosphatase: 47 U/L (ref 39–117)
Bilirubin, Direct: 0 mg/dL (ref 0.0–0.3)
Total Bilirubin: 0.5 mg/dL (ref 0.2–1.2)
Total Protein: 7.6 g/dL (ref 6.0–8.3)

## 2018-09-19 NOTE — Progress Notes (Signed)
#IPF - uip path 03/22/13  - On OFEV since early May 2015   PFT FVC fev1 ratio BD fev1 TLC DLCO Walk test 172f x 3 laps wt rx             Spring 2014 2.9:L L/% /%        June 2015 2.7L/67% 2.34L/78% 86  3.68/57% 20.24/71%   ofev start  [name]  2016 2.5L/55% 2.1L/60% 84/108%    No desat, Pk HR 130    02/11/2015 Screening visit afferent cough study 2.59L/56% 2.24L/63% 87/112%    Dx with afluttter on ekg   Screen failed due to hx of stones  03/26/2015        No desat, PK HR 130    08/26/15 pft machine 2.73L/68% 2.38L/80% 87   [name]     06/29/2016 Office spiro 2.49L/56% 2.14L/65%     Pk HR 90 and lowest pulse ox 99% ->93%  ofev  11/02/2016  office full pft  2.61L/66% 2.3L/79%    17.23/60%   pfev  09/16/2017  2.53L/64%      Pulse ox 99% -> 93%, HR 81- > 84  ofev  12/22/2017        100% -> 97%,  HR 66 -> 96    09/19/2018        100% -> 93, HR 74 -> 85         OV 03/26/2015  Chief Complaint  Patient presents with  . Follow-up    [name], dry cough. CATH procedure on 04/08/15. Denies any chest tightnes/congestion.    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 He is on Ofev after having failed Esbriet in the past due to side effects. He is tolerating Ofev well. Liver function test in March 2016 was normal. I last saw him in January 2016. Subsequently in February 2016 we screened him for a cough idiopathic pulmonary fibrosis study called afferent but he screen failed due to history of renal stones. At this time he was incidentally diagnosed with new onset atrial flutter. He has seen Dr. GCrissie Sicklesand has been started on anticoagulation with Xarelto and metoprolol. He is due to undergo a ablation. He is not aware of the increased bleeding risk with Ofev in the setting of anticoagulation. He assures me that the anti-coag and is only until he undergoes ablation and after that the plan is to stop it. Therefore only short-term anticoagulation with Xarelto.  Overall his effort tolerance is fine. He has postponed his [facility] lung transplant until he completes his ablation.  Walking desaturation test in the office today he did not desaturate. This is stable. Spirometry not done   Past medical history: Atrial flutter new diagnoses as above   OV 06/29/2016  Chief Complaint  Patient presents with  . Follow-up    pt states he is at baseline: sob with exertion, nonprod cough.      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 He is on Ofev . He completed 6 months of PRAISE study (([id number] placebo) in l;ate winter/early spring 2017. This is [place]. Overall stable. No worsening dyspnea and cough. Continues daily exercise is walking many miles. Some days he is sitting more than the others. He is interested in more trials and results of the praise study. These results are not out yet. He is compliant with his Ofe last liver function test was in February 2017. There are no new issues. He believes his atrial fibrillation is  in sinus rhythm;    OV  11/02/2016  Chief Complaint  Patient presents with  . Follow-up    4 month IPF follow up and B&A review - does report increased prod cough with yellow mucus, wheezing, tightness in chest, increased SOB, x3-4 weeks with the weather change.  denies any f/c/s, hemoptysis, chest pain   IPF - on ofev. Last liver function test was in July 2017 and normal. Overall dyspnea stable. However he is been having diarrhea with ofev. He called in and we reduced it to 1 tablet a day and this improved the diarrhea. He back on 1 tablet twice a day. The diarrhea has returned although it is much milder. He has never taken any medication for this. Are function test shows slight improvement from July 2017 and similar levels to approximately one year ago  The new issue that he's having significant recurrent sinus infection in the back of chronic postnasal drainage. This since the fall 2017. He says that he and his wife catch  respirator infection and passive back and forth to each other. He is having cough, chest tightness, postnasal drip and yellow sputum. It is not affecting any dyspnea. There is no wheeze. There is no fever or hemoptysis.  OV 03/02/2017  Chief Complaint  Patient presents with  . Follow-up    Pt states his breathing is at baseline. Pt c/o dry cough - pt states this is baseline. Pt denies CP/tightness.     Idiopathic pulmonary fibrosis on Ofev. Last seen November 2017. He is a routine follow-up.  He is here with his wife. His sinus infections a result. He is interested in research protocols but we have none right now. He is tolerating his Ofev associated with some mild diarrhea occasional which she takes medication. He is not much of a problem. He tells me that she walks 6 times a week for 5 miles a day. On the treadmill at a 5 incline walking at 3-1/2 miles an hour. For this he feels a little more dyspneic than before. Walking desaturation test 185 feet 3 laps on room air: His pulse ox dropped from 98% at rest and 93% with exertion. His heart rate jumped from 66 at rest and 97 at peak exertion. Features are suggestive of mild progression of the disease. He is not attending pulmonary fibrosis foundation because he felt this was negative but that was years ago. We discussed this and he might be interested again.     OV 09/16/2017  Chief Complaint  Patient presents with  . Follow-up    PFT done today. Pt states that his breathing is gradually getting worse. SOB which depends if it is on exertion vs all the time and c/o prod cough with yellow mucus. Denies any CP   S IPF on fev followup. Overall doing well. He recent bronchitis and liked anabiotic and prednisone. He felt the prednisone helped his cough just currently rated as mild to moderate in severity. Helped the shortness of breath. He also helped arthralgia. He is now wondering what taking chronic prednisone. We had an extensive discussion  about this. Spirometry shows that the FVC stable compared to earlier this year but slightly progressive compared to one year ago. [facility] interstitial lung disease questionnaire shows symptoms. His wife is here with him. He is interested in research trials. He has participated in distress before. He is asking about opioid refill for cough if I wont do prednisone.   OV 12/22/2017  Chief Complaint  Patient  presents with  . Follow-up    Pt states he believes his breathing is at a stable point right now. States that he is coughing which is worse when he first wakes up and then also at night; occ will cough up yellow phlegm.     C.o ofev intolerance with fatigue, weight loss, diarrhea. Does not like lomotil. Wnaant to give it a holiday foir 2-3 weeks. INterested in research protocol - discussed [place]. 5 year cut off since diagnosis coming up 03/22/18. He is overall frustrated ith qualtiy of life. Walks 5 miles; starts feeling dizzy > 3 miles in but not checking pulse ox despite advice. Walking desaturation test on 12/22/2017 185 feet x 3 laps on ROOM AIR:  did noit desaturate. Rest pulse ox was 100%, final pulse ox was 97%. HR response 66/min at rest to 96/min at peak exertion. Patient Tanner Bishop  Did not Desaturate < 88% . Vegas Reesman yes  Desaturated </= 3% points. Florencio Baucom yes did get tachyardic     Walking desaturation test on 02/01/2018 185 feet x 3 laps on ROOM AIR:  did not desaturate. Rest pulse ox was 100%, final pulse ox was 91%. HR response 67/min at rest to 85/min at peak exertion. Patient Kysen Keltz  dod not Desaturate < 88% . Jebidiah Kohl yes diod  Desaturated </= 3% points. Pranshu Wiedemann did not get tachyardic   OV 02/01/2018  Chief Complaint  Patient presents with  . Follow-up    Pt states he has been doing good since last visit. Started back on OFEV 12/24/17 after taking a holiday off of it.     FU IPF  Took ofev holiday. And then restarted 01/24/18 and doing well.  [place] ofev ok. KBILD ok.   OV 03/16/2018'  . Chief Complaint  Patient presents with  . Follow-up    PFT done today. States he has been doing well and denies any current complaints. Tolerating OFEV well and has been walking about 5 miles every day with no complaints.   Ms. Pineo returns for follow-up of idiopathic pulmonary fibrosis.  He is now back on nintedanib after giving it a holiday for a few weeks.  He is tolerating it well and no problems.  He goes for daily walks and has no problems.  His lung function was reviewed today and it shows for the first time a significant decline of greater than 200 cc between 2 time points.  This correlates with him not taking over for a few weeks nevertheless overall he is happy with his health.  He is not interested in research trials anymore.  He has a new question about travel advice encounter.  He plans an [place] from [place] intrajejunal in [place] for 10 days.  This will start on May 22, 2018.  He wants to make sure it is safe for him to go.  He said he has never been on a cruise and he and his wife have a strong desire to go as part of his goals of care.   OV 05/16/2018  Chief Complaint  Patient presents with  . Follow-up    Breathing is unchanged since last OV.    Tanner Bishop , 72 y.o. , with dob 01/10/1946 and male ,Not Hispanic or Latino from [address] [place] [address] - presents to pulm ILD clinic for IPF.  He presents with his wife.  And just under 7 days he is going to embark on a [place]  cruise.  This visit is precautionary visit just before that.  Most recent visit was in April 2019.  At this point in time in terms of his IPF he feels stable.  He is compliant with full dose of 5 and is having only mild diarrhea.  Other than that he is tolerating it just fine.  He is looking forward to his cruise.  He had pulmonary function test today that shows an improvement compared to last visit and it  is very similar to summer/fall 2018 in terms of FVC and DLCO.  Although he does feel like he is coming down with an acute bronchitis and wants to take some antibiotic and prednisone I had of the trip.  We also discussed about him having prophylactic antibiotics handy.  He wants a refill on his Tussionex for cough.  At this point in time he is past 5 years of diagnosis and not eligible for research trials he is not interested in transplant although the recent pulmonary fibrosis foundation meeting he did hear from a lot of transplant patients about the individual personal experience.       OV 09/19/2018  Subjective:  Patient ID: Tanner Bishop, male , DOB: [birth year] , age 14 y.o. , MRN: [id number] , ADDRESS: [place] [id number]   09/19/2018 -   Chief Complaint  Patient presents with  . Follow-up    Pt saw cards 08/23/18. States he is still having problems with his heart going in and out of rhythm and states he has had some episodes to where he almost passes out. States his breathing is at a stable point. States he also has  an occ cough.     HPI Asah Kepner 72 y.o. -presents 5-year follow-up.  I personally saw him in June 2019.  Then approximately a month ago he presented and saw acutely my nurse practitioner because of dizziness.  It was felt to be A. fib RVR.  He subsequently saw Dr. Crissie Sickles his electrophysiologist.  I reviewed the note.  Flecainide was started.  He says despite that l approximately 10 weeks ago he had another episode of dizziness while getting out of the shower.  He was tachycardic with a heart rate of 140s.  He felt he might pass out but there is no focal neurologic deficits or abnormal sensation.  His pulse ox was normal at 97% at that time.  It happened at rest.  He says he has been walking on the treadmill for several miles and he does not desaturate.  He says his pulse ox is 93% at that time.  He never drops into the 80s.  He does not think his dizziness  and palpitation issues are related to his pulmonary fibrosis.  However [name] is wondering about oxygen drops during this time.  He is not tolerating his nintedanib at full dose without any problems.  He has had a successful [place] trip.  He is participating in the patient support group.  He is up-to-date with his flu shot       Results for QUANTE, PETTRY (MRN [id number]) as of 09/16/2017 09:43  Ref. Range 05/14/2014 15:46 07/24/2015 12:43 08/26/2015 10:52 11/02/2016 09:51 05/19/2017 08:41 09/16/2017 08:47 03/16/17 05/16/2018   FVC-Pre Latest Units: L 2.71 2.69 2.73 2.61 2.51 2.53 2.29 2.38  FVC-%Pred-Pre Latest Units: % 67 67 68 66 63 64 58 60   Results for BYRNE, CAPEK (MRN [id number]) as of 09/16/2017 09:43  Ref. Range 05/14/2014 15:46 07/24/2015 12:43 08/26/2015  10:52 11/02/2016 09:51 05/19/2017 08:41 09/16/2017 08:47 03/16/2018  05/16/2018   DLCO unc Latest Units: ml/min/mmHg 20.24 15.36 15.36 17.23 15.83  13.14 16.48  DLCO unc % pred Latest Units: % 71 54 54 60 55  46 58     Simple office walk 185 feet x  3 laps goal with forehead probe 09/19/2018   O2 used Room air  Number laps completed 3  Comments about pace normal  Resting Pulse Ox/HR 100% and 73/min  Final Pulse Ox/HR 93% and 85/min  Desaturated </= 88% no  Desaturated <= 3% points yes  Got Tachycardic >/= 90/min no  Symptoms at end of test x  Miscellaneous comments x     ROS - per HPI     has a past medical history of Allergy, Arthritis, Atrial flutter ([place]), Cataract, GERD (gastroesophageal reflux disease), H/O hiatal hernia, Hyperlipidemia, Injury of right hand, Pulmonary fibrosis ([place]), Renal stone (06/2014), and Ulcer.   reports that he quit smoking about 39 years ago. His smoking use included cigarettes. He has a 16.00 pack-year smoking history. He has never used smokeless tobacco.  Past Surgical History:  Procedure Laterality Date  . ATRIAL FLUTTER ABLATION N/A 04/08/2015   Procedure: ATRIAL FLUTTER ABLATION;  Surgeon:  Evans Lance, MD;  Location: [facility] CATH LAB;  Service: Cardiovascular;  Laterality: N/A;  . CATARACT EXTRACTION, BILATERAL  2016  . COLONOSCOPY  03/01/2017   per Dr. Loletha Carrow, clear, repeat in 5 yrs (brother had colon cancer)   . ESOPHAGOGASTRODUODENOSCOPY  2007  . EXTRACORPOREAL SHOCK WAVE LITHOTRIPSY  06-17-14   per Dr. Jeffie Pollock   . HAND SURGERY Right   . LUNG BIOPSY Left 03/22/2013   Procedure: LUNG BIOPSY;  Surgeon: Melrose Nakayama, MD;  Location: [facility];  Service: Thoracic;  Laterality: Left;  . POLYPECTOMY    . UPPER GASTROINTESTINAL ENDOSCOPY    . VIDEO ASSISTED THORACOSCOPY Left 03/22/2013   Procedure: VIDEO ASSISTED THORACOSCOPY;  Surgeon: Melrose Nakayama, MD;  Location: [facility];  Service: Thoracic;  Laterality: Left;    No Known Allergies  Immunization History  Administered Date(s) Administered  . Influenza Split 09/13/2013  . Influenza, High Dose Seasonal PF 10/06/2016, 09/16/2017, 08/17/2018  . Influenza-Unspecified 09/07/2014, 09/14/2015  . Pneumococcal Conjugate-13 02/16/2017  . Pneumococcal Polysaccharide-23 05/26/2013  . Td 12/15/2007  . Tdap 02/23/2018  . Zoster 02/19/2014    Family History  Problem Relation Age of Onset  . Liver cancer Mother   . Diabetes Sister   . Lung cancer Sister   . Heart disease Brother   . Diabetes Brother   . Colon cancer Brother   . Diabetes Unknown   . Cancer Unknown        prostate  . Colon polyps Neg Hx   . Esophageal cancer Neg Hx   . Rectal cancer Neg Hx   . Stomach cancer Neg Hx      Current Outpatient Medications:  .  diphenhydrAMINE (BENADRYL) 25 MG tablet, Take 25 mg by mouth every 6 (six) hours as needed for allergies or sleep., Disp: , Rfl:  .  flecainide (TAMBOCOR) 50 MG tablet, Take 1 tablet (50 mg total) by mouth 2 (two) times daily., Disp: 60 tablet, Rfl: 11 .  fluticasone (FLONASE) 50 MCG/ACT nasal spray, Place 2 sprays into both nostrils daily., Disp: 48 g, Rfl: 5 .  Nintedanib (OFEV) 150 MG CAPS, Take 1  capsule (150 mg total) by mouth 2 (two) times daily., Disp: 60 capsule, Rfl: 12 .  pantoprazole (  PROTONIX) 40 MG tablet, Take 1 tablet (40 mg total) by mouth 2 (two) times daily., Disp: 180 tablet, Rfl: 3 .  zolpidem (AMBIEN) 5 MG tablet, Take 1 tablet (5 mg total) by mouth at bedtime as needed for sleep., Disp: 30 tablet, Rfl: 5 .  chlorpheniramine-HYDROcodone (TUSSIONEX PENNKINETIC ER) 10-8 MG/5ML SUER, Take 5 mLs by mouth 2 (two) times daily as needed for cough. (Patient not taking: Reported on 09/19/2018), Disp: 140 mL, Rfl: 0 .  morphine 10 MG/5ML solution, Take 2 mLs (4 mg total) by mouth 2 (two) times daily as needed. (Patient not taking: Reported on 09/19/2018), Disp: 120 mL, Rfl: 0  Current Facility-Administered Medications:  .  0.9 %  sodium chloride infusion, 500 mL, Intravenous, Continuous, Danis, Estill Cotta III, MD      Objective:   Vitals:   09/19/18 1021  BP: 112/70  Pulse: 75  SpO2: 96%  Weight: 171 lb 9.6 oz (77.8 kg)  Height: _0  (1.727 m)    Estimated body mass index is 26.09 kg/m as calculated from the following:   Height as of this encounter: _1  (1.727 m).   Weight as of this encounter: 171 lb 9.6 oz (77.8 kg).  _2 @  [employer]   09/19/18 1021  Weight: 171 lb 9.6 oz (77.8 kg)     Physical Exam  General Appearance:    Alert, cooperative, no distress, appears stated age - yes , Deconditioned looking - no , OBESE  - yes, Sitting on Wheelchair -  no  Head:    Normocephalic, without obvious abnormality, atraumatic  Eyes:    PERRL, conjunctiva/corneas clear,  Ears:    Normal TM's and external ear canals, both ears  Nose:   Nares normal, septum midline, mucosa normal, no drainage    or sinus tenderness. OXYGEN ON  - no . Patient is @ ra   Throat:   Lips, mucosa, and tongue normal; teeth and gums normal. Cyanosis on lips - no  Neck:   Supple, symmetrical, trachea midline, no adenopathy;    thyroid:  no enlargement/tenderness/nodules; no carotid    bruit or JVD  Back:     Symmetric, no curvature, ROM normal, no CVA tenderness  Lungs:     Distress - no , Wheeze no, Barrell Chest - no, Purse lip breathing - no, Crackles - velcro at base; not worse, stable baseline   Chest Wall:    No tenderness or deformity.    Heart:    Regular rate and rhythm, S1 and S2 normal, no rub   or gallop, Murmur - no  Breast Exam:    NOT DONE  Abdomen:     Soft, non-tender, bowel sounds active all four quadrants,    no masses, no organomegaly. Visceral obesity - no  Genitalia:   NOT DONE  Rectal:   NOT DONE  Extremities:   Extremities - normal, Has Cane - no, Clubbing - no, Edema - no  Pulses:   2+ and symmetric all extremities  Skin:   Stigmata of Connective Tissue Disease - no  Lymph nodes:   Cervical, supraclavicular, and axillary nodes normal  Psychiatric:  Neurologic:   Pleasant - yes, Anxious - no, Flat affect - no  CAm-ICU - neg, Alert and Oriented x 3 - yes, Moves all 4s - yes, Speech - normal, Cognition - intact           Assessment:       ICD-10-CM   1. IPF (idiopathic pulmonary fibrosis) (  [place]) J84.112   2. Irregular heart rhythm I49.9   3. Encounter for therapeutic drug monitoring Z51.81        Plan:     Patient Instructions     ICD-10-CM   1. IPF (idiopathic pulmonary fibrosis) ([place]) J84.112   2. Irregular heart rhythm I49.9   3. Encounter for therapeutic drug monitoring Z51.81     IPF itself seems stable Need to see if oxygen issues are causing A Fib problems Need to check liver tests with ofev  Plan LFt check - 09/19/2018  Check ONO on room air Check  53md next few to several weeks Check spirometry and dlco next few to several weeks Continue ofev as scheduled Please talk to PCP FLaurey Morale MD -  and ensure you get  shingrix ([place] inactivated vaccine against shingles   Followup ILD clinic next few to several weeks but after completing ONO, 620m, and spiro/dlco     > 50% of this > 25 min visit spent in face  to face counseling or coordination of care - by this undersigned MD - Dr MuBrand MalesThis includes one or more of the following documented above: discussion of test results, diagnostic or treatment recommendations, prognosis, risks and benefits of management options, instructions, education, compliance or risk-factor reduction   SIGNATURE    Dr. MuBrand MalesM.D., F.C.C.P,  Pulmonary and Critical Care Medicine Staff Physician, [place]irector - Interstitial Lung Disease  Program  Pulmonary [id number]: [phone] no answer or between  15:00h - 7:00h: call [phone] Telephone: [phone]  11:11 AM 09/19/2018

## 2018-09-19 NOTE — Addendum Note (Signed)
Addended by: Lorretta Harp on: 09/19/2018 11:17 AM   Modules accepted: Orders

## 2018-09-19 NOTE — Patient Instructions (Addendum)
ICD-10-CM   1. IPF (idiopathic pulmonary fibrosis) ([place]) J84.112   2. Irregular heart rhythm I49.9   3. Encounter for therapeutic drug monitoring Z51.81     IPF itself seems stable Need to see if oxygen issues are causing A Fib problems Need to check liver tests with ofev  Plan LFt check - 09/19/2018  Check ONO on room air Check  27md next few to several weeks Check spirometry and dlco next few to several weeks Continue ofev as scheduled Please talk to PCP FLaurey Morale MD -  and ensure you get  shingrix ([place] inactivated vaccine against shingles   Followup ILD clinic next few to several weeks but after completing ONO, 628m, and spiro/dlco

## 2018-09-21 ENCOUNTER — Telehealth: Payer: Self-pay | Admitting: Internal Medicine

## 2018-09-21 NOTE — Telephone Encounter (Signed)
Brand Males, MD sent to [place], [name], CMA        Liver function test normal           Spoke with pt and notified of results per [name]. Pt verbalized understanding and denied any questions.

## 2018-09-21 NOTE — Progress Notes (Signed)
See phone note dated 09/21/18

## 2018-09-23 ENCOUNTER — Encounter: Payer: Self-pay | Admitting: Internal Medicine

## 2018-09-23 ENCOUNTER — Encounter (INDEPENDENT_AMBULATORY_CARE_PROVIDER_SITE_OTHER): Payer: Self-pay

## 2018-09-23 ENCOUNTER — Ambulatory Visit: Payer: PPO | Admitting: Internal Medicine

## 2018-09-23 VITALS — BP 118/72 | HR 68 | Ht 68.0 in | Wt 170.6 lb

## 2018-09-23 DIAGNOSIS — I4892 Unspecified atrial flutter: Secondary | ICD-10-CM

## 2018-09-23 DIAGNOSIS — R5383 Other fatigue: Secondary | ICD-10-CM

## 2018-09-23 DIAGNOSIS — R002 Palpitations: Secondary | ICD-10-CM | POA: Diagnosis not present

## 2018-09-23 DIAGNOSIS — R0602 Shortness of breath: Secondary | ICD-10-CM | POA: Diagnosis not present

## 2018-09-23 MED ORDER — FLECAINIDE ACETATE 50 MG PO TABS: 50.0000 mg | ORAL_TABLET | Freq: Three times a day (TID) | ORAL | 3 refills | Status: DC

## 2018-09-23 NOTE — Patient Instructions (Addendum)
Medication Instructions:  Your physician has recommended you make the following change in your medication:   1.  Increase your flecainide 50 mg--- Take one tablet by mouth THREE times a day  Labwork: None ordered.  Testing/Procedures: None ordered.  Follow-Up: Your physician wants you to follow-up in: 6 months with [name].   You will receive a reminder letter in the mail two months in advance. If you don't receive a letter, please call our office to schedule the follow-up appointment.  Any Other Special Instructions Will Be Listed Below (If Applicable).  If you need a refill on your cardiac medications before your next appointment, please call your pharmacy.

## 2018-09-23 NOTE — Progress Notes (Signed)
HPI [name] returns today for followup of his palpitations. He has a remote h/o atrial flutter but no fib but has had frequent palpitations. He was placed on very low dose flecainide and has had improvement but is still having some symptoms, especially after exercising. No chest pain. He walks 5 miles daily.  No Known Allergies   Current Outpatient Medications  Medication Sig Dispense Refill  . chlorpheniramine-HYDROcodone (TUSSIONEX PENNKINETIC ER) 10-8 MG/5ML SUER Take 5 mLs by mouth 2 (two) times daily as needed for cough. 140 mL 0  . diphenhydrAMINE (BENADRYL) 25 MG tablet Take 25 mg by mouth every 6 (six) hours as needed for allergies or sleep.    . flecainide (TAMBOCOR) 50 MG tablet Take 1 tablet (50 mg total) by mouth 2 (two) times daily. 60 tablet 11  . fluticasone (FLONASE) 50 MCG/ACT nasal spray Place 2 sprays into both nostrils daily. 48 g 5  . gabapentin (NEURONTIN) 100 MG capsule Take 1 capsule by mouth 3 (three) times daily.  5  . morphine 10 MG/5ML solution Take 2 mLs (4 mg total) by mouth 2 (two) times daily as needed. 120 mL 0  . Nintedanib (OFEV) 150 MG CAPS Take 1 capsule (150 mg total) by mouth 2 (two) times daily. 60 capsule 12  . pantoprazole (PROTONIX) 40 MG tablet Take 1 tablet (40 mg total) by mouth 2 (two) times daily. 180 tablet 3  . zolpidem (AMBIEN) 5 MG tablet Take 1 tablet (5 mg total) by mouth at bedtime as needed for sleep. 30 tablet 5   Current Facility-Administered Medications  Medication Dose Route Frequency Provider Last Rate Last Dose  . 0.9 %  sodium chloride infusion  500 mL Intravenous Continuous Doran Stabler, MD         Past Medical History:  Diagnosis Date  . Allergy   . Arthritis   . Atrial flutter ([place])    a. s/p ablation 03/2015 Dr Lovena Le  . Cataract    removed both eyes  . GERD (gastroesophageal reflux disease)   . H/O hiatal hernia   . Hyperlipidemia   . Injury of right hand    permanent damage after workplace  2009 and 2010 Dr Vernona Rieger [facility]  . Pulmonary fibrosis ([place])    sees Dr. Onnie Graham   . Renal stone 06/2014  . Ulcer    unresolved    ROS:   All systems reviewed and negative except as noted in the HPI.   Past Surgical History:  Procedure Laterality Date  . ATRIAL FLUTTER ABLATION N/A 04/08/2015   Procedure: ATRIAL FLUTTER ABLATION;  Surgeon: Evans Lance, MD;  Location: [facility] CATH LAB;  Service: Cardiovascular;  Laterality: N/A;  . CATARACT EXTRACTION, BILATERAL  2016  . COLONOSCOPY  03/01/2017   per Dr. Loletha Carrow, clear, repeat in 5 yrs (brother had colon cancer)   . ESOPHAGOGASTRODUODENOSCOPY  2007  . EXTRACORPOREAL SHOCK WAVE LITHOTRIPSY  06-17-14   per Dr. Jeffie Pollock   . HAND SURGERY Right   . LUNG BIOPSY Left 03/22/2013   Procedure: LUNG BIOPSY;  Surgeon: Melrose Nakayama, MD;  Location: [place];  Service: Thoracic;  Laterality: Left;  . POLYPECTOMY    . UPPER GASTROINTESTINAL ENDOSCOPY    . VIDEO ASSISTED THORACOSCOPY Left 03/22/2013   Procedure: VIDEO ASSISTED THORACOSCOPY;  Surgeon: Melrose Nakayama, MD;  Location: [facility] OR;  Service: Thoracic;  Laterality: Left;     Family History  Problem Relation Age of Onset  .  Liver cancer Mother   . Diabetes Sister   . Lung cancer Sister   . Heart disease Brother   . Diabetes Brother   . Colon cancer Brother   . Diabetes Unknown   . Cancer Unknown        prostate  . Colon polyps Neg Hx   . Esophageal cancer Neg Hx   . Rectal cancer Neg Hx   . Stomach cancer Neg Hx      Social History   Socioeconomic History  . Marital status: Married    Spouse name: Not on file  . Number of children: Not on file  . Years of education: Not on file  . Highest education level: Not on file  Occupational History  . 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 Occupation: Disabled   Social Needs  . Financial resource strain: Not on file  . Food insecurity:    Worry: Not on file    Inability: Not on file  .  Transportation needs:    Medical: Not on file    Non-medical: Not on file  Tobacco Use  . Smoking status: Former Smoker    Packs/day: 1.00    Years: 16.00    Pack years: 16.00    Types: Cigarettes    Last attempt to quit: 12/14/1978    Years since quitting: 39.8  . Smokeless tobacco: Never Used  Substance and Sexual Activity  . Alcohol use: No    Alcohol/week: 0.0 standard drinks  . Drug use: No  . Sexual activity: Not on file  Lifestyle  . Physical activity:    Days per week: Not on file    Minutes per session: Not on file  . Stress: Not on file  Relationships  . Social connections:    Talks on phone: Not on file    Gets together: Not on file    Attends religious service: Not on file    Active member of club or organization: Not on file    Attends meetings of clubs or organizations: Not on file    Relationship status: Not on file  . Intimate partner violence:    Fear of current or ex partner: Not on file    Emotionally abused: Not on file    Physically abused: Not on file    Forced sexual activity: Not on file  Other Topics Concern  . Not on file  Social History Narrative  . Not on file     BP 118/72   Pulse 68   Ht _0  (1.727 m)   Wt 170 lb 9.6 oz (77.4 kg)   SpO2 98%   BMI 25.94 kg/m   Physical Exam:  Well appearing NAD HEENT: Unremarkable Neck:  No JVD, no thyromegally Lymphatics:  No adenopathy Back:  No CVA tenderness Lungs:  Scattered rales, more so at the base HEART:  Regular rate rhythm, no murmurs, no rubs, no clicks Abd:  soft, positive bowel sounds, no organomegally, no rebound, no guarding Ext:  2 plus pulses, no edema, no cyanosis, no clubbing Skin:  No rashes no nodules Neuro:  CN II through XII intact, motor grossly intact  EKG - nsr with RBBB   Assess/Plan: 1. Palpitations - his symptoms are improved. I have recommended uptitration of his medical therapy increasing flecainide to 75 bid/50 tid, his choice. 2. IPF - he appears stable  and is not on oxygen.  3. Atrial flutter - he has had no recurrent episodes.  Tanner Bishop  Tanner Bishop,M.D.

## 2018-09-26 ENCOUNTER — Ambulatory Visit (INDEPENDENT_AMBULATORY_CARE_PROVIDER_SITE_OTHER): Payer: PPO | Admitting: *Deleted

## 2018-09-26 DIAGNOSIS — J84112 Idiopathic pulmonary fibrosis: Secondary | ICD-10-CM

## 2018-09-26 DIAGNOSIS — I499 Cardiac arrhythmia, unspecified: Secondary | ICD-10-CM

## 2018-09-26 NOTE — Progress Notes (Signed)
SIX MIN WALK 09/26/2018 09/19/2018 02/01/2018 12/22/2017 09/16/2017 05/19/2017 03/02/2017  Medications OFEV 123m, Tambocor 551mtaken at 8am - - - - - -  Supplimental Oxygen during Test? (L/min) _0  No -  Laps 8 - - - - - -  Partial Lap (in Meters) 0 - - - - - -  Baseline BP (sitting) 122/70 - - - - - -  Baseline Heartrate 66 - - - - - -  Baseline Dyspnea (Borg Scale) 0 - - - - - -  Baseline Fatigue (Borg Scale) 0 - - - - - -  Baseline SPO2 96 - - - - - -  BP (sitting) 130/74 - - - - - -  Heartrate 76 - - - - - -  Dyspnea (Borg Scale) 0 - - - - - -  Fatigue (Borg Scale) 0 - - - - - -  SPO2 95 - - - - - -  BP (sitting) 120/70 - - - - - -  Heartrate 76 - - - - - -  SPO2 97 - - - - - -  Stopped or Paused before Six Minutes No - - - - - -  Distance Completed 384 - - - - - -  Tech Comments: Pt walked at a normal to moderate pace denying any concerns during the walk. Patient was able to maintain a steady pace during, walk did not have to stop at any time. Patient stated he walks 5 miles daily. Pt walked at a normal pace completing all required laps. Denied any complaints. TA/CMA Pt walked at a normal pace successfully completing all required laps. Stated that he was okay during walk with some SOB. - Stady fast pace/3 laps/ no complication/TA

## 2018-10-05 DIAGNOSIS — R0902 Hypoxemia: Secondary | ICD-10-CM | POA: Diagnosis not present

## 2018-10-05 DIAGNOSIS — J449 Chronic obstructive pulmonary disease, unspecified: Secondary | ICD-10-CM | POA: Diagnosis not present

## 2018-10-07 ENCOUNTER — Telehealth: Payer: Self-pay | Admitting: Internal Medicine

## 2018-10-07 MED ORDER — PREDNISONE 10 MG PO TABS: ORAL_TABLET | ORAL | 0 refills | Status: DC

## 2018-10-07 MED ORDER — DOXYCYCLINE HYCLATE 100 MG PO TABS: 100.0000 mg | ORAL_TABLET | Freq: Two times a day (BID) | ORAL | 0 refills | Status: DC

## 2018-10-07 MED ORDER — HYDROCOD POLST-CPM POLST ER 10-8 MG/5ML PO SUER: 5.0000 mL | Freq: Two times a day (BID) | ORAL | 0 refills | Status: DC | PRN

## 2018-10-07 NOTE — Telephone Encounter (Signed)
Take prednisone 40 mg daily x 2 days, then 41m daily x 2 days, then 181mdaily x 2 days, then 72m72maily x 2 days and stop  Take doxycycline 100m7m twice daily x 5 days; take after meals and avoid sunlight

## 2018-10-07 NOTE — Telephone Encounter (Signed)
Rx has been printed for Wyn Quaker, NP to sign.  Called and spoke with pt letting him know that the Rx was ready for him to pick up.  Pt stated to me he was also wondering if he could have an abx and pred Rx sent to his pharmacy as he has been coughing with yellow mucus, having some mild wheezing and chest tightness that has been going on for about a week now.  MR, please advise if you are okay with [place] sending in Rx of abx and pred script for pt. Thanks!

## 2018-10-07 NOTE — Telephone Encounter (Signed)
Due to MR not being in the office to be able to sign the Rx, sending to APP of day.  Aaron Edelman, please advise if you will be okay signing for the Tussionex Rx for pt.  Med last filled 05/16/18, #175m with 0 RF

## 2018-10-07 NOTE — Telephone Encounter (Signed)
Yes okay to fill.  Checked PMP website patient has a opioid risk score of 290.  Patient has had multiple providers prescribed him narcotics in the past most of which have been in our office.  I believe it is okay to provide this prescription for him with no refills.  I will sign the form.  Wyn Quaker, FNP

## 2018-10-07 NOTE — Telephone Encounter (Signed)
Called and spoke with pt letting him know we were going to send in a doxy script as well as pred script for him.  Pt expressed understanding. I verified pt's preferred pharmacy and sent both Rx's in for pt. Nothing further needed.

## 2018-10-13 ENCOUNTER — Ambulatory Visit (INDEPENDENT_AMBULATORY_CARE_PROVIDER_SITE_OTHER): Payer: PPO | Admitting: Internal Medicine

## 2018-10-13 ENCOUNTER — Encounter: Payer: Self-pay | Admitting: Internal Medicine

## 2018-10-13 VITALS — BP 124/72 | HR 70 | Ht 67.0 in | Wt 171.0 lb

## 2018-10-13 DIAGNOSIS — J84112 Idiopathic pulmonary fibrosis: Secondary | ICD-10-CM | POA: Diagnosis not present

## 2018-10-13 DIAGNOSIS — J209 Acute bronchitis, unspecified: Secondary | ICD-10-CM | POA: Diagnosis not present

## 2018-10-13 DIAGNOSIS — Z5181 Encounter for therapeutic drug level monitoring: Secondary | ICD-10-CM | POA: Diagnosis not present

## 2018-10-13 LAB — PULMONARY FUNCTION TEST
DL/VA % pred: 94 %
DL/VA: 4.16 ml/min/mmHg/L
DLCO unc % pred: 50 %
DLCO unc: 14.17 ml/min/mmHg
FEF 25-75 Pre: 2.83 L/sec
FEF2575-%Pred-Pre: 131 %
FEV1-%Pred-Pre: 67 %
FEV1-Pre: 1.93 L
FEV1FVC-%Pred-Pre: 117 %
FEV6-%Pred-Pre: 61 %
FEV6-Pre: 2.24 L
FEV6FVC-%Pred-Pre: 106 %
FVC-%Pred-Pre: 57 %
FVC-Pre: 2.25 L
Pre FEV1/FVC ratio: 86 %
Pre FEV6/FVC Ratio: 100 %

## 2018-10-13 MED ORDER — PREDNISONE 10 MG PO TABS: ORAL_TABLET | ORAL | 0 refills | Status: DC

## 2018-10-13 NOTE — Progress Notes (Signed)
Spirometry and Dlco done today.

## 2018-10-13 NOTE — Progress Notes (Signed)
#IPF - uip path 03/22/13  - On OFEV since early May 2015   PFT FVC fev1 ratio BD fev1 TLC DLCO Walk test 172f x 3 laps wt rx             Spring 2014 2.9:L L/% /%        June 2015 2.7L/67% 2.34L/78% 86  3.68/57% 20.24/71%   ofev start  [name]  2016 2.5L/55% 2.1L/60% 84/108%    No desat, Pk HR 130    02/11/2015 Screening visit afferent cough study 2.59L/56% 2.24L/63% 87/112%    Dx with afluttter on ekg   Screen failed due to hx of stones  03/26/2015        No desat, PK HR 130    08/26/15 pft machine 2.73L/68% 2.38L/80% 87   [name]     06/29/2016 Office spiro 2.49L/56% 2.14L/65%     Pk HR 90 and lowest pulse ox 99% ->93%  ofev  11/02/2016  office full pft  2.61L/66% 2.3L/79%    17.23/60%   pfev  09/16/2017  2.53L/64%      Pulse ox 99% -> 93%, HR 81- > 84  ofev  12/22/2017        100% -> 97%,  HR 66 -> 96    09/19/2018        100% -> 93, HR 74 -> 85         OV 03/26/2015  Chief Complaint  Patient presents with  . Follow-up    [name], dry cough. CATH procedure on 04/08/15. Denies any chest tightnes/congestion.    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 He is on Ofev after having failed Esbriet in the past due to side effects. He is tolerating Ofev well. Liver function test in March 2016 was normal. I last saw him in January 2016. Subsequently in February 2016 we screened him for a cough idiopathic pulmonary fibrosis study called afferent but he screen failed due to history of renal stones. At this time he was incidentally diagnosed with new onset atrial flutter. He has seen Dr. GCrissie Sicklesand has been started on anticoagulation with Xarelto and metoprolol. He is due to undergo a ablation. He is not aware of the increased bleeding risk with Ofev in the setting of anticoagulation. He assures me that the anti-coag and is only until he undergoes ablation and after that the plan is to stop it. Therefore only short-term anticoagulation with Xarelto.  Overall his effort tolerance is fine. He has postponed his [facility] lung transplant until he completes his ablation.  Walking desaturation test in the office today he did not desaturate. This is stable. Spirometry not done   Past medical history: Atrial flutter new diagnoses as above   OV 06/29/2016  Chief Complaint  Patient presents with  . Follow-up    pt states he is at baseline: sob with exertion, nonprod cough.      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 He is on Ofev . He completed 6 months of PRAISE study (([id number] placebo) in l;ate winter/early spring 2017. This is [place]. Overall stable. No worsening dyspnea and cough. Continues daily exercise is walking many miles. Some days he is sitting more than the others. He is interested in more trials and results of the praise study. These results are not out yet. He is compliant with his Ofe last liver function test was in February 2017. There are no new issues. He believes his atrial fibrillation is  in sinus rhythm;    OV  11/02/2016  Chief Complaint  Patient presents with  . Follow-up    4 month IPF follow up and B&A review - does report increased prod cough with yellow mucus, wheezing, tightness in chest, increased SOB, x3-4 weeks with the weather change.  denies any f/c/s, hemoptysis, chest pain   IPF - on ofev. Last liver function test was in July 2017 and normal. Overall dyspnea stable. However he is been having diarrhea with ofev. He called in and we reduced it to 1 tablet a day and this improved the diarrhea. He back on 1 tablet twice a day. The diarrhea has returned although it is much milder. He has never taken any medication for this. Are function test shows slight improvement from July 2017 and similar levels to approximately one year ago  The new issue that he's having significant recurrent sinus infection in the back of chronic postnasal drainage. This since the fall 2017. He says that he and his wife catch  respirator infection and passive back and forth to each other. He is having cough, chest tightness, postnasal drip and yellow sputum. It is not affecting any dyspnea. There is no wheeze. There is no fever or hemoptysis.  OV 03/02/2017  Chief Complaint  Patient presents with  . Follow-up    Pt states his breathing is at baseline. Pt c/o dry cough - pt states this is baseline. Pt denies CP/tightness.     Idiopathic pulmonary fibrosis on Ofev. Last seen November 2017. He is a routine follow-up.  He is here with his wife. His sinus infections a result. He is interested in research protocols but we have none right now. He is tolerating his Ofev associated with some mild diarrhea occasional which she takes medication. He is not much of a problem. He tells me that she walks 6 times a week for 5 miles a day. On the treadmill at a 5 incline walking at 3-1/2 miles an hour. For this he feels a little more dyspneic than before. Walking desaturation test 185 feet 3 laps on room air: His pulse ox dropped from 98% at rest and 93% with exertion. His heart rate jumped from 66 at rest and 97 at peak exertion. Features are suggestive of mild progression of the disease. He is not attending pulmonary fibrosis foundation because he felt this was negative but that was years ago. We discussed this and he might be interested again.     OV 09/16/2017  Chief Complaint  Patient presents with  . Follow-up    PFT done today. Pt states that his breathing is gradually getting worse. SOB which depends if it is on exertion vs all the time and c/o prod cough with yellow mucus. Denies any CP   S IPF on fev followup. Overall doing well. He recent bronchitis and liked anabiotic and prednisone. He felt the prednisone helped his cough just currently rated as mild to moderate in severity. Helped the shortness of breath. He also helped arthralgia. He is now wondering what taking chronic prednisone. We had an extensive discussion  about this. Spirometry shows that the FVC stable compared to earlier this year but slightly progressive compared to one year ago. [facility] interstitial lung disease questionnaire shows symptoms. His wife is here with him. He is interested in research trials. He has participated in distress before. He is asking about opioid refill for cough if I wont do prednisone.   OV 12/22/2017  Chief Complaint  Patient  presents with  . Follow-up    Pt states he believes his breathing is at a stable point right now. States that he is coughing which is worse when he first wakes up and then also at night; occ will cough up yellow phlegm.     C.o ofev intolerance with fatigue, weight loss, diarrhea. Does not like lomotil. Wnaant to give it a holiday foir 2-3 weeks. INterested in research protocol - discussed [place]. 5 year cut off since diagnosis coming up 03/22/18. He is overall frustrated ith qualtiy of life. Walks 5 miles; starts feeling dizzy > 3 miles in but not checking pulse ox despite advice. Walking desaturation test on 12/22/2017 185 feet x 3 laps on ROOM AIR:  did noit desaturate. Rest pulse ox was 100%, final pulse ox was 97%. HR response 66/min at rest to 96/min at peak exertion. Patient Keyondre Lyon  Did not Desaturate < 88% . Brook Vallas yes  Desaturated </= 3% points. [place] Linker yes did get tachyardic     Walking desaturation test on 02/01/2018 185 feet x 3 laps on ROOM AIR:  did not desaturate. Rest pulse ox was 100%, final pulse ox was 91%. HR response 67/min at rest to 85/min at peak exertion. Patient Jefferie Habig  dod not Desaturate < 88% . Gregery Sellitto yes diod  Desaturated </= 3% points. Amahri Whitefield did not get tachyardic   OV 02/01/2018  Chief Complaint  Patient presents with  . Follow-up    Pt states he has been doing good since last visit. Started back on OFEV 12/24/17 after taking a holiday off of it.     FU IPF  Took ofev holiday. And then restarted 01/24/18 and doing well.  [place] ofev ok. KBILD ok.   OV 03/16/2018'  . Chief Complaint  Patient presents with  . Follow-up    PFT done today. States he has been doing well and denies any current complaints. Tolerating OFEV well and has been walking about 5 miles every day with no complaints.   Ms. Pineo returns for follow-up of idiopathic pulmonary fibrosis.  He is now back on nintedanib after giving it a holiday for a few weeks.  He is tolerating it well and no problems.  He goes for daily walks and has no problems.  His lung function was reviewed today and it shows for the first time a significant decline of greater than 200 cc between 2 time points.  This correlates with him not taking over for a few weeks nevertheless overall he is happy with his health.  He is not interested in research trials anymore.  He has a new question about travel advice encounter.  He plans an [place] from [place], [place] intrajejunal in [place] for 10 days.  This will start on May 22, 2018.  He wants to make sure it is safe for him to go.  He said he has never been on a cruise and he and his wife have a strong desire to go as part of his goals of care.   OV 05/16/2018  Chief Complaint  Patient presents with  . Follow-up    Breathing is unchanged since last OV.    [name] , 72 y.o. , with dob 01/10/1946 and male ,Not Hispanic or Latino from [address] [place] [address] - presents to pulm ILD clinic for IPF.  He presents with his wife.  And just under 7 days he is going to embark on a [place]  cruise.  This visit is precautionary visit just before that.  Most recent visit was in April 2019.  At this point in time in terms of his IPF he feels stable.  He is compliant with full dose of 5 and is having only mild diarrhea.  Other than that he is tolerating it just fine.  He is looking forward to his cruise.  He had pulmonary function test today that shows an improvement compared to last visit and it  is very similar to summer/fall 2018 in terms of FVC and DLCO.  Although he does feel like he is coming down with an acute bronchitis and wants to take some antibiotic and prednisone I had of the trip.  We also discussed about him having prophylactic antibiotics handy.  He wants a refill on his Tussionex for cough.  At this point in time he is past 5 years of diagnosis and not eligible for research trials he is not interested in transplant although the recent pulmonary fibrosis foundation meeting he did hear from a lot of transplant patients about the individual personal experience.       OV 09/19/2018  Subjective:  Patient ID: [name], male , DOB: [birth year] , age 14 y.o. , MRN: [id number] , ADDRESS: [place] [id number]   09/19/2018 -   Chief Complaint  Patient presents with  . Follow-up    Pt saw cards 08/23/18. States he is still having problems with his heart going in and out of rhythm and states he has had some episodes to where he almost passes out. States his breathing is at a stable point. States he also has  an occ cough.     HPI Wilford Gagen 72 y.o. -presents 5-year follow-up.  I personally saw him in June 2019.  Then approximately a month ago he presented and saw acutely my nurse practitioner because of dizziness.  It was felt to be A. fib RVR.  He subsequently saw Dr. Crissie Sickles his electrophysiologist.  I reviewed the note.  Flecainide was started.  He says despite that l approximately 10 weeks ago he had another episode of dizziness while getting out of the shower.  He was tachycardic with a heart rate of 140s.  He felt he might pass out but there is no focal neurologic deficits or abnormal sensation.  His pulse ox was normal at 97% at that time.  It happened at rest.  He says he has been walking on the treadmill for several miles and he does not desaturate.  He says his pulse ox is 93% at that time.  He never drops into the 80s.  He does not think his dizziness  and palpitation issues are related to his pulmonary fibrosis.  However [name] is wondering about oxygen drops during this time.  He is not tolerating his nintedanib at full dose without any problems.  He has had a successful [place] trip.  He is participating in the patient support group.  He is up-to-date with his flu shot      OV 10/13/2018  Subjective:  Patient ID: [name], male , DOB: 06/21/1946 , age 72 y.o. , MRN: [id number] , ADDRESS: [place] [id number]   10/13/2018 -   Chief Complaint  Patient presents with  . Follow-up    review PFT.  c/o baseline sob with exertion.       HPI Kivon Maxim 72 y.o. -presents for follow-up of IPF to the ILD clinic.  This visit is arranged because letter physiologist [name] was concerned about hypoxemia effects on his atrial fibrillation.  At this point in time patient tells me that after increasing dose of flecainide his atrial fibrillation and palpitations have improved and resolved.  Overall he feels stable.  On September 26, 2018 he had 6-minute walk test and this was pretty robust with walking 384 meters and with lowest pulse ox of 95%.  Then on October 05, 2018 he had overnight pulse oximetry that shows 12 minutes of desaturation less than 88%.  He is not interested in oxygen.  Then he called his October 07, 2018 with worsening bronchitis episodes and we gave him 5 days of prednisone and antibiotics.  He finished his last dose yesterday.  Overall he feels stable but pulmonary function test today shows decline in both FVC and DLCO.  And then when I questioned him he felt like he still had some residual bronchiti and feels another course of prednisone could help him.  There are no other new issues.  Last liver function test October 2019 and was normal.      Results for ROBBERT, LANGLINAIS (MRN [id number]) as of 09/16/2017 09:43  Ref. Range 05/14/2014 15:46 07/24/2015 12:43 08/26/2015 10:52 11/02/2016 09:51 05/19/2017 08:41  09/16/2017 08:47 03/16/17 05/16/2018  10/13/2018   FVC-Pre Latest Units: L 2.71 2.69 2.73 2.61 2.51 2.53 2.29 2.38 2.25  FVC-%Pred-Pre Latest Units: % 67 67 68 66 63 64 58 60 57%   Results for ANCEL, EASLER (MRN [id number]) as of 09/16/2017 09:43  Ref. Range 05/14/2014 15:46 07/24/2015 12:43 08/26/2015 10:52 11/02/2016 09:51 05/19/2017 08:41 09/16/2017 08:47 03/16/2018  05/16/2018  10/13/2018   DLCO unc Latest Units: ml/min/mmHg 20.24 15.36 15.36 17.23 15.83  13.14 16.48 14.17  DLCO unc % pred Latest Units: % 71 54 54 60 55  46 58 50%     Simple office walk 185 feet x  3 laps goal with forehead probe 09/19/2018  10/13/2018   O2 used Room air   Number laps completed 3   Comments about pace normal   Resting Pulse Ox/HR 100% and 73/min   Final Pulse Ox/HR 93% and 85/min   Desaturated </= 88% no   Desaturated <= 3% points yes   Got Tachycardic >/= 90/min no   Symptoms at end of test x   Miscellaneous comments x      ROS - per HPI     has a past medical history of Allergy, Arthritis, Atrial flutter ([place]), Cataract, GERD (gastroesophageal reflux disease), H/O hiatal hernia, Hyperlipidemia, Injury of right hand, Pulmonary fibrosis ([place]), Renal stone (06/2014), and Ulcer.   reports that he quit smoking about 39 years ago. His smoking use included cigarettes. He has a 16.00 pack-year smoking history. He has never used smokeless tobacco.  Past Surgical History:  Procedure Laterality Date  . ATRIAL FLUTTER ABLATION N/A 04/08/2015   Procedure: ATRIAL FLUTTER ABLATION;  Surgeon: [place] Lance, MD;  Location: [facility] CATH LAB;  Service: Cardiovascular;  Laterality: N/A;  . CATARACT EXTRACTION, BILATERAL  2016  . COLONOSCOPY  03/01/2017   per Dr. Loletha Carrow, clear, repeat in 5 yrs (brother had colon cancer)   . ESOPHAGOGASTRODUODENOSCOPY  2007  . EXTRACORPOREAL SHOCK WAVE LITHOTRIPSY  06-17-14   per Dr. Jeffie Pollock   . HAND SURGERY Right   . LUNG BIOPSY Left 03/22/2013   Procedure: LUNG BIOPSY;  Surgeon: Melrose Nakayama, MD;  Location: [facility];  Service: Thoracic;  Laterality: Left;  . POLYPECTOMY    .  UPPER GASTROINTESTINAL ENDOSCOPY    . VIDEO ASSISTED THORACOSCOPY Left 03/22/2013   Procedure: VIDEO ASSISTED THORACOSCOPY;  Surgeon: Melrose Nakayama, MD;  Location: [facility];  Service: Thoracic;  Laterality: Left;    No Known Allergies  Immunization History  Administered Date(s) Administered  . Influenza Split 09/13/2013  . Influenza, High Dose Seasonal PF 10/06/2016, 09/16/2017, 08/17/2018  . Influenza-Unspecified 09/07/2014, 09/14/2015  . Pneumococcal Conjugate-13 02/16/2017  . Pneumococcal Polysaccharide-23 05/26/2013  . Td 12/15/2007  . Tdap 02/23/2018  . Zoster 02/19/2014    Family History  Problem Relation Age of Onset  . Liver cancer Mother   . Diabetes Sister   . Lung cancer Sister   . Heart disease Brother   . Diabetes Brother   . Colon cancer Brother   . Diabetes Unknown   . Cancer Unknown        prostate  . Colon polyps Neg Hx   . Esophageal cancer Neg Hx   . Rectal cancer Neg Hx   . Stomach cancer Neg Hx      Current Outpatient Medications:  .  chlorpheniramine-HYDROcodone (TUSSIONEX PENNKINETIC ER) 10-8 MG/5ML SUER, Take 5 mLs by mouth 2 (two) times daily as needed for cough., Disp: 140 mL, Rfl: 0 .  diphenhydrAMINE (BENADRYL) 25 MG tablet, Take 25 mg by mouth every 6 (six) hours as needed for allergies or sleep., Disp: , Rfl:  .  flecainide (TAMBOCOR) 50 MG tablet, Take 1 tablet (50 mg total) by mouth 3 (three) times daily., Disp: 270 tablet, Rfl: 3 .  fluticasone (FLONASE) 50 MCG/ACT nasal spray, Place 2 sprays into both nostrils daily., Disp: 48 g, Rfl: 5 .  morphine 10 MG/5ML solution, Take 2 mLs (4 mg total) by mouth 2 (two) times daily as needed., Disp: 120 mL, Rfl: 0 .  Nintedanib (OFEV) 150 MG CAPS, Take 1 capsule (150 mg total) by mouth 2 (two) times daily., Disp: 60 capsule, Rfl: 12 .  pantoprazole (PROTONIX) 40 MG tablet, Take 1 tablet (40 mg total) by  mouth 2 (two) times daily., Disp: 180 tablet, Rfl: 3 .  zolpidem (AMBIEN) 5 MG tablet, Take 1 tablet (5 mg total) by mouth at bedtime as needed for sleep., Disp: 30 tablet, Rfl: 5 .  predniSONE (DELTASONE) 10 MG tablet, Take 5m x days, then Take 327mx 3 days, then Take 2050m 3 days, then Take 60m40m3 days, then Take 5mg 77m days then STOP., Disp: 32 tablet, Rfl: 0  Current Facility-Administered Medications:  .  0.9 %  sodium chloride infusion, 500 mL, Intravenous, Continuous, Danis, HenryEstill Cotta MD      Objective:   Vitals:   10/13/18 1034  BP: 124/72  Pulse: 70  SpO2: 97%  Weight: 171 lb (77.6 kg)  Height: _0  (1.702 m)    Estimated body mass index is 26.78 kg/m as calculated from the following:   Height as of this encounter: _1  (1.702 m).   Weight as of this encounter: 171 lb (77.6 kg).  _2 @  Filed Weights   10/13/18 1034  Weight: 171 lb (77.6 kg)     Physical Exam  General Appearance:    Alert, cooperative, no distress, appears stated age - yes , Deconditioned looking - no , OBESE  - no, Sitting on Wheelchair -  no  Head:    Normocephalic, without obvious abnormality, atraumatic  Eyes:    PERRL, conjunctiva/corneas clear,  Ears:    Normal TM's and external ear canals,  both ears  Nose:   Nares normal, septum midline, mucosa normal, no drainage    or sinus tenderness. OXYGEN ON  - no . Patient is @ ra   Throat:   Lips, mucosa, and tongue normal; teeth and gums normal. Cyanosis on lips - no  Neck:   Supple, symmetrical, trachea midline, no adenopathy;    thyroid:  no enlargement/tenderness/nodules; no carotid   bruit or JVD  Back:     Symmetric, no curvature, ROM normal, no CVA tenderness  Lungs:     Distress - no , Wheeze no, Barrell Chest - no, Purse lip breathing - no, Crackles -Velcro crackles halfway up in the back  Chest Wall:    No tenderness or deformity.    Heart:    Regular rate and rhythm, S1 and S2 normal, no rub   or gallop, Murmur -  no  Breast Exam:    NOT DONE  Abdomen:     Soft, non-tender, bowel sounds active all four quadrants,    no masses, no organomegaly. Visceral obesity - no  Genitalia:   NOT DONE  Rectal:   NOT DONE  Extremities:   Extremities - normal, Has Cane - no, Clubbing - yes mild, Edema - no  Pulses:   2+ and symmetric all extremities  Skin:   Stigmata of Connective Tissue Disease - no  Lymph nodes:   Cervical, supraclavicular, and axillary nodes normal  Psychiatric:  Neurologic:   Pleasant - yes, Anxious - no, Flat affect - no  CAm-ICU - neg, Alert and Oriented x 3 - yes, Moves all 4s - yes, Speech - normal, Cognition - intact           Assessment:       ICD-10-CM   1. Acute bronchitis, unspecified organism J20.9 predniSONE (DELTASONE) 10 MG tablet    Pulmonary function test  2. IPF (idiopathic pulmonary fibrosis) ([place]) J84.112   3. Encounter for therapeutic drug monitoring Z51.81        Plan:     Patient Instructions      ICD-10-CM   1. Acute bronchitis, unspecified organism J20.9   2. IPF (idiopathic pulmonary fibrosis) ([place]) J84.112   3. Encounter for therapeutic drug monitoring Z51.81     I believe slightly progressive versus acute bronchitis making a pulmonary function test numbers worse  Daytime oxygen level with exertion at 6-minute walk test and office walk test is adequate  Pulmonary desaturation test October 05, 2018 shows 12-1/2 minutes of pulse ox less than 88%  Plan - Please take Take prednisone 61m once daily x 3 days, then 319monce daily x 3 days, then 2071mnce daily x 3 days, then prednisone 15m44mce daily  x 3 days and then prednisone 5 mg once daily x3 days and stop  -Continue nintedanib as scheduled as before  -Respect deferral of nighttime oxygen  -Do spirometry and DLCO in 3-4 months  Follow-up -Any change in health status: [place] or return to clinic sooner -Otherwise ILD clinic in 3-4 months but after having spirometry and  DLCO     > 50% of this > 25 min visit spent in face to face counseling or coordination of care - by this undersigned MD - Dr MuraBrand Malesis includes one or more of the following documented above: discussion of test results, diagnostic or treatment recommendations, prognosis, risks and benefits of management options, instructions, education, compliance or risk-factor reduction    SIGNATURE    Dr. MuraBrand MalesD.,  F.C.C.P,  Pulmonary and Critical Care Medicine Staff Physician, [place] Director - Interstitial Lung Disease  Program  P[facility] at [place], [place], [address]  Pager: [phone], If no answer or between  15:00h - 7:00h: call [phone] Telephone: [phone]  11:11 AM 10/13/2018

## 2018-10-13 NOTE — Patient Instructions (Addendum)
ICD-10-CM   1. Acute bronchitis, unspecified organism J20.9   2. IPF (idiopathic pulmonary fibrosis) ([place]) J84.112   3. Encounter for therapeutic drug monitoring Z51.81     I believe slightly progressive versus acute bronchitis making a pulmonary function test numbers worse  Daytime oxygen level with exertion at 6-minute walk test and office walk test is adequate  Pulmonary desaturation test October 05, 2018 shows 12-1/2 minutes of pulse ox less than 88%  Plan - Please take Take prednisone 53m once daily x 3 days, then 32monce daily x 3 days, then 2059mnce daily x 3 days, then prednisone 30m68mce daily  x 3 days and then prednisone 5 mg once daily x3 days and stop  -Continue nintedanib as scheduled as before  -Respect deferral of nighttime oxygen  -Do spirometry and DLCO in 3-4 months  Follow-up -Any change in health status: [place] or return to clinic sooner -Otherwise ILD clinic in 3-4 months but after having spirometry and DLCO

## 2019-01-12 ENCOUNTER — Ambulatory Visit: Payer: PPO | Admitting: Internal Medicine

## 2019-01-12 ENCOUNTER — Ambulatory Visit (INDEPENDENT_AMBULATORY_CARE_PROVIDER_SITE_OTHER): Payer: PPO | Admitting: Internal Medicine

## 2019-01-12 ENCOUNTER — Encounter: Payer: Self-pay | Admitting: Internal Medicine

## 2019-01-12 VITALS — BP 122/64 | HR 74 | Ht 67.0 in | Wt 173.4 lb

## 2019-01-12 DIAGNOSIS — J209 Acute bronchitis, unspecified: Secondary | ICD-10-CM | POA: Diagnosis not present

## 2019-01-12 DIAGNOSIS — J84112 Idiopathic pulmonary fibrosis: Secondary | ICD-10-CM

## 2019-01-12 DIAGNOSIS — Z5181 Encounter for therapeutic drug level monitoring: Secondary | ICD-10-CM | POA: Diagnosis not present

## 2019-01-12 DIAGNOSIS — R05 Cough: Secondary | ICD-10-CM

## 2019-01-12 DIAGNOSIS — J019 Acute sinusitis, unspecified: Secondary | ICD-10-CM

## 2019-01-12 DIAGNOSIS — J849 Interstitial pulmonary disease, unspecified: Secondary | ICD-10-CM | POA: Diagnosis not present

## 2019-01-12 DIAGNOSIS — R053 Chronic cough: Secondary | ICD-10-CM

## 2019-01-12 DIAGNOSIS — R0989 Other specified symptoms and signs involving the circulatory and respiratory systems: Secondary | ICD-10-CM

## 2019-01-12 DIAGNOSIS — R059 Cough, unspecified: Secondary | ICD-10-CM

## 2019-01-12 LAB — PULMONARY FUNCTION TEST
DL/VA % pred: 102 %
DL/VA: 4.52 ml/min/mmHg/L
DLCO unc % pred: 50 %
DLCO unc: 14.38 ml/min/mmHg
FEF 25-75 Post: 3.79 L/sec
FEF 25-75 Pre: 2.85 L/sec
FEF2575-%Change-Post: 33 %
FEF2575-%Pred-Post: 176 %
FEF2575-%Pred-Pre: 132 %
FEV1-%Change-Post: 5 %
FEV1-%Pred-Post: 71 %
FEV1-%Pred-Pre: 67 %
FEV1-Post: 2.03 L
FEV1-Pre: 1.93 L
FEV1FVC-%Change-Post: 3 %
FEV1FVC-%Pred-Pre: 118 %
FEV6-%Change-Post: 0 %
FEV6-%Pred-Post: 60 %
FEV6-%Pred-Pre: 60 %
FEV6-Post: 2.2 L
FEV6-Pre: 2.21 L
FEV6FVC-%Pred-Post: 106 %
FEV6FVC-%Pred-Pre: 106 %
FVC-%Change-Post: 1 %
FVC-%Pred-Post: 57 %
FVC-%Pred-Pre: 56 %
FVC-Post: 2.25 L
FVC-Pre: 2.21 L
Post FEV1/FVC ratio: 90 %
Post FEV6/FVC ratio: 100 %
Pre FEV1/FVC ratio: 87 %
Pre FEV6/FVC Ratio: 100 %

## 2019-01-12 LAB — HEPATIC FUNCTION PANEL
ALT: 14 U/L (ref 0–53)
AST: 21 U/L (ref 0–37)
Albumin: 4.1 g/dL (ref 3.5–5.2)
Alkaline Phosphatase: 50 U/L (ref 39–117)
Bilirubin, Direct: 0.1 mg/dL (ref 0.0–0.3)
Total Bilirubin: 0.5 mg/dL (ref 0.2–1.2)
Total Protein: 7.3 g/dL (ref 6.0–8.3)

## 2019-01-12 MED ORDER — PREDNISONE 10 MG PO TABS: ORAL_TABLET | ORAL | 0 refills | Status: DC

## 2019-01-12 MED ORDER — DOXYCYCLINE HYCLATE 100 MG PO TABS: 100.0000 mg | ORAL_TABLET | Freq: Two times a day (BID) | ORAL | 0 refills | Status: DC

## 2019-01-12 NOTE — Patient Instructions (Addendum)
IPF (idiopathic pulmonary fibrosis) ([place])  - slowly progressive  - continue ofev as before - see you tonight at support group meeting  Encounter for therapeutic drug monitoring  - check LFT because ofev intake; last checked oct 2019  Acute sinusitis, recurrence not specified, unspecified location Chronic sinus complaints  Take doxycycline 164m po twice daily x 7 days; take after meals and avoid sunlight Take prednisone 40 mg daily x 2 days, then 276mdaily x 2 days, then 1069maily x 2 days, then 5mg62mily x 2 days and stop    Chronic cough -chronic cough seems to be slowly getting worse as pft getting worse with time - But need to understand if there are issues other than IPF making it worse. So,   - do CT sinus without contrast next few to several weeks  - do HRCT supine and prone next few to several weeks -For now try inspiratory muscle inhalation mechanical device to see by opening up your lungs if the cough will get better  Followup  1 month to ILD clinic to review results and discuss options for cough

## 2019-01-12 NOTE — Progress Notes (Signed)
Tanner Bishop and Tanner Bishop only today.Tanner Bishop

## 2019-01-12 NOTE — Addendum Note (Signed)
Addended by: Lorretta Harp on: 01/12/2019 11:22 AM   Modules accepted: Orders

## 2019-01-12 NOTE — Progress Notes (Signed)
#IPF - uip path 03/22/13  - On OFEV since early May 2015   PFT FVC fev1 ratio BD fev1 TLC DLCO Walk test 163f x 3 laps wt rx             Spring 2014 2.9:L L/% /%        June 2015 2.7L/67% 2.34L/78% 86  3.68/57% 20.24/71%   ofev start  [name]  2016 2.5L/55% 2.1L/60% 84/108%    No desat, Pk HR 130    02/11/2015 Screening visit afferent cough study 2.59L/56% 2.24L/63% 87/112%    Dx with afluttter on ekg   Screen failed due to hx of stones  03/26/2015        No desat, PK HR 130    08/26/15 pft machine 2.73L/68% 2.38L/80% 87   [name]     06/29/2016 Office spiro 2.49L/56% 2.14L/65%     Pk HR 90 and lowest pulse ox 99% ->93%  ofev  11/02/2016  office full pft  2.61L/66% 2.3L/79%    17.23/60%   pfev  09/16/2017  2.53L/64%      Pulse ox 99% -> 93%, HR 81- > 84  ofev  12/22/2017        100% -> 97%,  HR 66 -> 96    09/19/2018        100% -> 93, HR 74 -> 85         OV 03/26/2015  Chief Complaint  Patient presents with  . Follow-up    [name], dry cough. CATH procedure on 04/08/15. Denies any chest tightnes/congestion.    73year old male with idiopathic pulmonary fibrosis. He is on Ofev after having failed Esbriet in the past due to side effects. He is tolerating Ofev well. Liver function test in March 2016 was normal. I last saw him in January 2016. Subsequently in February 2016 we screened him for a cough idiopathic pulmonary fibrosis study called afferent but he screen failed due to history of renal stones. At this time he was incidentally diagnosed with new onset atrial flutter. He has seen Dr. GCrissie Sicklesand has been started on anticoagulation with Xarelto and metoprolol. He is due to undergo a ablation. He is not aware of the increased bleeding risk with Ofev in the setting of anticoagulation. He assures me that the anti-coag and is only until he undergoes ablation and after that the plan is to stop it. Therefore only short-term anticoagulation with Xarelto.  Overall his effort tolerance is fine. He has postponed his [facility] lung transplant until he completes his ablation.  Walking desaturation test in the office today he did not desaturate. This is stable. Spirometry not done   Past medical history: Atrial flutter new diagnoses as above   OV 06/29/2016  Chief Complaint  Patient presents with  . Follow-up    pt states he is at baseline: sob with exertion, nonprod cough.      73year old male with idiopathic pulmonary fibrosis. He is on Ofev . He completed 6 months of PRAISE study (([id number] placebo) in l;ate winter/early spring 2017. This is [employer] Playvisit. Overall stable. No worsening dyspnea and cough. Continues daily exercise is walking many miles. Some days he is sitting more than the others. He is interested in more trials and results of the praise study. These results are not out yet. He is compliant with his Ofe last liver function test was in February 2017. There are no new issues. He believes his atrial fibrillation is  in sinus rhythm;    OV  11/02/2016  Chief Complaint  Patient presents with  . Follow-up    4 month IPF follow up and B&A review - does report increased prod cough with yellow mucus, wheezing, tightness in chest, increased SOB, x3-4 weeks with the weather change.  denies any f/c/s, hemoptysis, chest pain   IPF - on ofev. Last liver function test was in July 2017 and normal. Overall dyspnea stable. However he is been having diarrhea with ofev. He called in and we reduced it to 1 tablet a day and this improved the diarrhea. He back on 1 tablet twice a day. The diarrhea has returned although it is much milder. He has never taken any medication for this. Are function test shows slight improvement from July 2017 and similar levels to approximately one year ago  The new issue that he's having significant recurrent sinus infection in the back of chronic postnasal drainage. This since the fall 2017. He says that he and his wife catch  respirator infection and passive back and forth to each other. He is having cough, chest tightness, postnasal drip and yellow sputum. It is not affecting any dyspnea. There is no wheeze. There is no fever or hemoptysis.  OV 03/02/2017  Chief Complaint  Patient presents with  . Follow-up    Pt states his breathing is at baseline. Pt c/o dry cough - pt states this is baseline. Pt denies CP/tightness.     Idiopathic pulmonary fibrosis on Ofev. Last seen November 2017. He is a routine follow-up.  He is here with his wife. His sinus infections a result. He is interested in research protocols but we have none right now. He is tolerating his Ofev associated with some mild diarrhea occasional which she takes medication. He is not much of a problem. He tells me that she walks 6 times a week for 5 miles a day. On the treadmill at a 5 incline walking at 3-1/2 miles an hour. For this he feels a little more dyspneic than before. Walking desaturation test 185 feet 3 laps on room air: His pulse ox dropped from 98% at rest and 93% with exertion. His heart rate jumped from 66 at rest and 97 at peak exertion. Features are suggestive of mild progression of the disease. He is not attending pulmonary fibrosis foundation because he felt this was negative but that was years ago. We discussed this and he might be interested again.     OV 09/16/2017  Chief Complaint  Patient presents with  . Follow-up    PFT done today. Pt states that his breathing is gradually getting worse. SOB which depends if it is on exertion vs all the time and c/o prod cough with yellow mucus. Denies any CP   S IPF on fev followup. Overall doing well. He recent bronchitis and liked anabiotic and prednisone. He felt the prednisone helped his cough just currently rated as mild to moderate in severity. Helped the shortness of breath. He also helped arthralgia. He is now wondering what taking chronic prednisone. We had an extensive discussion  about this. Spirometry shows that the FVC stable compared to earlier this year but slightly progressive compared to one year ago. [facility] interstitial lung disease questionnaire shows symptoms. His wife is here with him. He is interested in research trials. He has participated in distress before. He is asking about opioid refill for cough if I wont do prednisone.   OV 12/22/2017  Chief Complaint  Patient  presents with  . Follow-up    Pt states he believes his breathing is at a stable point right now. States that he is coughing which is worse when he first wakes up and then also at night; occ will cough up yellow phlegm.     C.o ofev intolerance with fatigue, weight loss, diarrhea. Does not like lomotil. Wnaant to give it a holiday foir 2-3 weeks. INterested in research protocol - discussed [place]. 5 year cut off since diagnosis coming up 03/22/18. He is overall frustrated ith qualtiy of life. Walks 5 miles; starts feeling dizzy > 3 miles in but not checking pulse ox despite advice. Walking desaturation test on 12/22/2017 185 feet x 3 laps on ROOM AIR:  did noit desaturate. Rest pulse ox was 100%, final pulse ox was 97%. HR response 66/min at rest to 96/min at peak exertion. Patient Tanner Bishop  Did not Desaturate < 88% . Tanner Bishop yes  Desaturated </= 3% points. Tanner Bishop yes did get tachyardic     Walking desaturation test on 02/01/2018 185 feet x 3 laps on ROOM AIR:  did not desaturate. Rest pulse ox was 100%, final pulse ox was 91%. HR response 67/min at rest to 85/min at peak exertion. Patient Tanner Bishop  dod not Desaturate < 88% . Tanner Bishop yes diod  Desaturated </= 3% points. Tanner Bishop did not get tachyardic   OV 02/01/2018  Chief Complaint  Patient presents with  . Follow-up    Pt states he has been doing good since last visit. Started back on OFEV 12/24/17 after taking a holiday off of it.     FU IPF  Took ofev holiday. And then restarted 01/24/18 and doing well.  [place] ofev ok. KBILD ok.   OV 03/16/2018'  . Chief Complaint  Patient presents with  . Follow-up    PFT done today. States he has been doing well and denies any current complaints. Tolerating OFEV well and has been walking about 5 miles every day with no complaints.   Ms. Pineo returns for follow-up of idiopathic pulmonary fibrosis.  He is now back on nintedanib after giving it a holiday for a few weeks.  He is tolerating it well and no problems.  He goes for daily walks and has no problems.  His lung function was reviewed today and it shows for the first time a significant decline of greater than 200 cc between 2 time points.  This correlates with him not taking over for a few weeks nevertheless overall he is happy with his health.  He is not interested in research trials anymore.  He has a new question about travel advice encounter.  He plans an [place] from [place], [place] intrajejunal in [place] for 10 days.  This will start on May 22, 2018.  He wants to make sure it is safe for him to go.  He said he has never been on a cruise and he and his wife have a strong desire to go as part of his goals of care.   OV 05/16/2018  Chief Complaint  Patient presents with  . Follow-up    Breathing is unchanged since last OV.    Tanner Bishop , 73 y.o. , with dob 01/10/1946 and male ,Not Hispanic or Latino from [address] [place] [address] - presents to pulm ILD clinic for IPF.  He presents with his wife.  And just under 7 days he is going to embark on a [place]  cruise.  This visit is precautionary visit just before that.  Most recent visit was in April 2019.  At this point in time in terms of his IPF he feels stable.  He is compliant with full dose of 5 and is having only mild diarrhea.  Other than that he is tolerating it just fine.  He is looking forward to his cruise.  He had pulmonary function test today that shows an improvement compared to last visit and it  is very similar to summer/fall 2018 in terms of FVC and DLCO.  Although he does feel like he is coming down with an acute bronchitis and wants to take some antibiotic and prednisone I had of the trip.  We also discussed about him having prophylactic antibiotics handy.  He wants a refill on his Tussionex for cough.  At this point in time he is past 5 years of diagnosis and not eligible for research trials he is not interested in transplant although the recent pulmonary fibrosis foundation meeting he did hear from a lot of transplant patients about the individual personal experience.       OV 09/19/2018  Subjective:  Patient ID: Tanner Bishop, male , DOB: [birth year] , age 14 y.o. , MRN: [id number] , ADDRESS: [place] [id number]   09/19/2018 -   Chief Complaint  Patient presents with  . Follow-up    Pt saw cards 08/23/18. States he is still having problems with his heart going in and out of rhythm and states he has had some episodes to where he almost passes out. States his breathing is at a stable point. States he also has  an occ cough.     HPI Tanner Bishop 73 y.o. -presents 5-year follow-up.  I personally saw him in June 2019.  Then approximately a month ago he presented and saw acutely my nurse practitioner because of dizziness.  It was felt to be A. fib RVR.  He subsequently saw Dr. Crissie Sickles his electrophysiologist.  I reviewed the note.  Flecainide was started.  He says despite that l approximately 10 weeks ago he had another episode of dizziness while getting out of the shower.  He was tachycardic with a heart rate of 140s.  He felt he might pass out but there is no focal neurologic deficits or abnormal sensation.  His pulse ox was normal at 97% at that time.  It happened at rest.  He says he has been walking on the treadmill for several miles and he does not desaturate.  He says his pulse ox is 93% at that time.  He never drops into the 80s.  He does not think his dizziness  and palpitation issues are related to his pulmonary fibrosis.  However [name] is wondering about oxygen drops during this time.  He is not tolerating his nintedanib at full dose without any problems.  He has had a successful [place] trip.  He is participating in the patient support group.  He is up-to-date with his flu shot      OV 10/13/2018  Subjective:  Patient ID: Tanner Bishop, male , DOB: 06/21/1946 , age 46 y.o. , MRN: [id number] , ADDRESS: [place] [id number]   10/13/2018 -   Chief Complaint  Patient presents with  . Follow-up    review PFT.  c/o baseline sob with exertion.       HPI Tanner Bishop 73 y.o. -presents for follow-up of IPF to the ILD clinic.  This visit is arranged because letter physiologist [name] was concerned about hypoxemia effects on his atrial fibrillation.  At this point in time patient tells me that after increasing dose of flecainide his atrial fibrillation and palpitations have improved and resolved.  Overall he feels stable.  On September 26, 2018 he had 6-minute walk test and this was pretty robust with walking 384 meters and with lowest pulse ox of 95%.  Then on October 05, 2018 he had overnight pulse oximetry that shows 12 minutes of desaturation less than 88%.  He is not interested in oxygen.  Then he called his October 07, 2018 with worsening bronchitis episodes and we gave him 5 days of prednisone and antibiotics.  He finished his last dose yesterday.  Overall he feels stable but pulmonary function test today shows decline in both FVC and DLCO.  And then when I questioned him he felt like he still had some residual bronchiti and feels another course of prednisone could help him.  There are no other new issues.  Last liver function test October 2019 and was normal.      OV 01/12/2019  Subjective:  Patient ID: Tanner Bishop, male , DOB: 09/04/1946 , age 43 y.o. , MRN: [id number] , ADDRESS: [place]  [id number]   01/12/2019 -   Chief Complaint  Patient presents with  . Follow-up    PFT performed today. Pt states he is about the same since last visit.States he still becomes SOB with exertion, has a lot of coughing but has been taking mucinex, and also has had some CP or chest tightness.     HPI Tanner Bishop 73 y.o. -returns for follow-up of his IPF.  He is now more than 5 years since diagnosis.,  April 2020 he will be 6 years since diagnosis.  He continues to exercise well on the treadmill and according to his wife he is does well.  He is not dropping oxygen at this time.  He does not have a cough when needed works on the tile.  However at other times the day especially when he is trying to lie down to bed or when he gets up in the morning and goes to the bathroom he has really bad cough.  Overall the cough severity is now 6 out of 10 in terms of how it is impacting his quality of life.  He tells me that every time he takes prednisone for the cough he feels better but then when he comes off prednisone cough gets worse.  He said multiple prednisone courses of brief duration for this chronic cough.  In addition for the last few weeks has had yellow sinus drainage and sinus headache and sinus congestion and he feels this is again making his chronic cough worse.  He is open to taking antibiotic and prednisone course.  In terms of taking nintedanib he has no side effects.  His last liver function test reviewed was in October 2019 and it was normal at that time.  Overall at this point in time he really wants good significant support for his cough in terms of affecting his quality of life.      Results for Tanner Bishop, Tanner Bishop (MRN [id number]) as of 09/16/2017 09:43  Ref. Range 05/14/2014 15:46 07/24/2015 12:43 08/26/2015 10:52 11/02/2016 09:51 05/19/2017 08:41 09/16/2017 08:47 03/16/17 05/16/2018  10/13/2018  01/12/2019   FVC-Pre Latest Units: L 2.71 2.69 2.73 2.61 2.51 2.53 2.29 2.38 2.25 2.21  FVC-%Pred-Pre Latest  Units: %  67 67 68 66 63 64 58 60 57% 56%   Results for Tanner Bishop, Tanner Bishop (MRN [id number]) as of 09/16/2017 09:43  Ref. Range 05/14/2014 15:46 07/24/2015 12:43 08/26/2015 10:52 11/02/2016 09:51 05/19/2017 08:41 09/16/2017 08:47 03/16/2018  05/16/2018  10/13/2018  01/12/2019   DLCO unc Latest Units: ml/min/mmHg 20.24 15.36 15.36 17.23 15.83  13.14 16.48 14.17 14.38  DLCO unc % pred Latest Units: % 71 54 54 60 55  46 58 50% 50%     Simple office walk 185 feet x  3 laps goal with forehead probe 09/19/2018  01/12/2019   O2 used Room air Room air  Number laps completed 3 3  Comments about pace normal normal  Resting Pulse Ox/HR 100% and 73/min 100% ad 74/min  Final Pulse Ox/HR 93% and 85/min 92% and 84/min  Desaturated </= 88% no no  Desaturated <= 3% points yes Yes, 8 ponts  Got Tachycardic >/= 90/min no no  Symptoms at end of test x none  Miscellaneous comments x x     ROS - per HPI     has a past medical history of Allergy, Arthritis, Atrial flutter (HCC), Cataract, GERD (gastroesophageal reflux disease), H/O hiatal hernia, Hyperlipidemia, Injury of right hand, Pulmonary fibrosis ([place]), Renal stone (06/2014), and Ulcer.   reports that he quit smoking about 40 years ago. His smoking use included cigarettes. He has a 16.00 pack-year smoking history. He has never used smokeless tobacco.  Past Surgical History:  Procedure Laterality Date  . ATRIAL FLUTTER ABLATION N/A 04/08/2015   Procedure: ATRIAL FLUTTER ABLATION;  Surgeon: Evans Lance, MD;  Location: [facility] CATH LAB;  Service: Cardiovascular;  Laterality: N/A;  . CATARACT EXTRACTION, BILATERAL  2016  . COLONOSCOPY  03/01/2017   per Dr. Loletha Carrow, clear, repeat in 5 yrs (brother had colon cancer)   . ESOPHAGOGASTRODUODENOSCOPY  2007  . EXTRACORPOREAL SHOCK WAVE LITHOTRIPSY  06-17-14   per Dr. Jeffie Pollock   . HAND SURGERY Right   . LUNG BIOPSY Left 03/22/2013   Procedure: LUNG BIOPSY;  Surgeon: Melrose Nakayama, MD;  Location: [facility];  Service: Thoracic;   Laterality: Left;  . POLYPECTOMY    . UPPER GASTROINTESTINAL ENDOSCOPY    . VIDEO ASSISTED THORACOSCOPY Left 03/22/2013   Procedure: VIDEO ASSISTED THORACOSCOPY;  Surgeon: Melrose Nakayama, MD;  Location: [facility];  Service: Thoracic;  Laterality: Left;    No Known Allergies  Immunization History  Administered Date(s) Administered  . Influenza Split 09/13/2013  . Influenza, High Dose Seasonal PF 10/06/2016, 09/16/2017, 08/17/2018  . Influenza-Unspecified 09/07/2014, 09/14/2015  . Pneumococcal Conjugate-13 02/16/2017  . Pneumococcal Polysaccharide-23 05/26/2013  . Td 12/15/2007  . Tdap 02/23/2018  . Zoster 02/19/2014  . Zoster Recombinat (Shingrix) 09/20/2018    Family History  Problem Relation Age of Onset  . Liver cancer Mother   . Diabetes Sister   . Lung cancer Sister   . Heart disease Brother   . Diabetes Brother   . Colon cancer Brother   . Diabetes Unknown   . Cancer Unknown        prostate  . Colon polyps Neg Hx   . Esophageal cancer Neg Hx   . Rectal cancer Neg Hx   . Stomach cancer Neg Hx      Current Outpatient Medications:  .  chlorpheniramine-HYDROcodone (TUSSIONEX PENNKINETIC ER) 10-8 MG/5ML SUER, Take 5 mLs by mouth 2 (two) times daily as needed for cough., Disp: 140 mL, Rfl: 0 .  diphenhydrAMINE (BENADRYL) 25 MG tablet, Take  25 mg by mouth every 6 (six) hours as needed for allergies or sleep., Disp: , Rfl:  .  flecainide (TAMBOCOR) 50 MG tablet, Take 1 tablet (50 mg total) by mouth 3 (three) times daily., Disp: 270 tablet, Rfl: 3 .  fluticasone (FLONASE) 50 MCG/ACT nasal spray, Place 2 sprays into both nostrils daily., Disp: 48 g, Rfl: 5 .  morphine 10 MG/5ML solution, Take 2 mLs (4 mg total) by mouth 2 (two) times daily as needed., Disp: 120 mL, Rfl: 0 .  Nintedanib (OFEV) 150 MG CAPS, Take 1 capsule (150 mg total) by mouth 2 (two) times daily., Disp: 60 capsule, Rfl: 12 .  pantoprazole (PROTONIX) 40 MG tablet, Take 1 tablet (40 mg total) by mouth 2 (two)  times daily., Disp: 180 tablet, Rfl: 3 .  zolpidem (AMBIEN) 5 MG tablet, Take 1 tablet (5 mg total) by mouth at bedtime as needed for sleep., Disp: 30 tablet, Rfl: 5  Current Facility-Administered Medications:  .  0.9 %  sodium chloride infusion, 500 mL, Intravenous, Continuous, Danis, Estill Cotta III, MD      Objective:   Vitals:   01/12/19 1035  BP: 122/64  Pulse: 74  SpO2: 100%  Weight: 173 lb 6.4 oz (78.7 kg)  Height: _0  (1.702 m)    Estimated body mass index is 27.16 kg/m as calculated from the following:   Height as of this encounter: _1  (1.702 m).   Weight as of this encounter: 173 lb 6.4 oz (78.7 kg).  _2 @  [employer]   01/12/19 1035  Weight: 173 lb 6.4 oz (78.7 kg)     Physical Exam  General Appearance:    Alert, cooperative, no distress, appears stated age - yes , Deconditioned looking - no , OBESE  - no, Sitting on Wheelchair -  no  Head:    Normocephalic, without obvious abnormality, atraumatic  Eyes:    PERRL, conjunctiva/corneas clear,  Ears:    Normal TM's and external ear canals, both ears  Nose:   Nares normal, septum midline, mucosa normal, PND+    or sinus tenderness. OXYGEN ON  - no . Patient is @ ra   Throat:   Lips, mucosa, and tongue normal; teeth and gums normal. Cyanosis on lips - no  Neck:   Supple, symmetrical, trachea midline, no adenopathy;    thyroid:  no enlargement/tenderness/nodules; no carotid   bruit or JVD  Back:     Symmetric, no curvature, ROM normal, no CVA tenderness  Lungs:     Distress - no , Wheeze non, Barrell Chest - o, Purse lip breathing - no, Crackles -  YES, VELCRO, 23/3rd up in base   Chest Wall:    No tenderness or deformity.    Heart:    Regular rate and rhythm, S1 and S2 normal, no rub   or gallop, Murmur - no  Breast Exam:    NOT DONE  Abdomen:     Soft, non-tender, bowel sounds active all four quadrants,    no masses, no organomegaly. Visceral obesity - no  Genitalia:   NOT DONE  Rectal:   NOT  DONE  Extremities:   Extremities - normal, Has Cane - no, Clubbing - no, Edema - no  Pulses:   2+ and symmetric all extremities  Skin:   Stigmata of Connective Tissue Disease - no  Lymph nodes:   Cervical, supraclavicular, and axillary nodes normal  Psychiatric:  Neurologic:   Pleasant - yes, Anxious - no, Flat  affect - no  CAm-ICU - neg, Alert and Oriented x 3 - yes, Moves all 4s - yes, Speech - normal, Cognition - intact           Assessment:       ICD-10-CM   1. IPF (idiopathic pulmonary fibrosis) ([place]) J84.112   2. Encounter for therapeutic drug monitoring Z51.81   3. Acute sinusitis, recurrence not specified, unspecified location J01.90   4. Chronic sinus complaints R09.89   5. Chronic cough R05        Plan:     Patient Instructions  IPF (idiopathic pulmonary fibrosis) ([place])  - slowly progressive  - continue ofev as before - see you tonight at support group meeting  Encounter for therapeutic drug monitoring  - check LFT because ofev intake; last checked oct 2019  Acute sinusitis, recurrence not specified, unspecified location Chronic sinus complaints  Take doxycycline 189m po twice daily x 7 days; take after meals and avoid sunlight Take prednisone 40 mg daily x 2 days, then 274mdaily x 2 days, then 1045maily x 2 days, then 5mg18mily x 2 days and stop    Chronic cough -chronic cough seems to be slowly getting worse as pft getting worse with time - But need to understand if there are issues other than IPF making it worse. So,   - do CT sinus without contrast next few to several weeks  - do HRCT supine and prone next few to several weeks -For now try inspiratory muscle inhalation mechanical device to see by opening up your lungs if the cough will get better  Followup  1 month to ILD clinic to review results and discuss options for cough     SIGNATURE    Dr. MuraBrand MalesD., F.C.C.P,  Pulmonary and Critical Care Medicine Staff Physician,  [facility]  [facility]  [place], [id number]: [phone] no answer or between  15:00h - 7:00h: call [phone] Telephone: [phone]  11:13 AM 01/12/2019

## 2019-01-12 NOTE — Addendum Note (Signed)
Addended by: Suzzanne Cloud E on: 01/12/2019 11:45 AM   Modules accepted: Orders

## 2019-01-12 NOTE — Addendum Note (Signed)
Addended by: Lorretta Harp on: 01/12/2019 11:21 AM   Modules accepted: Orders

## 2019-01-31 ENCOUNTER — Ambulatory Visit (INDEPENDENT_AMBULATORY_CARE_PROVIDER_SITE_OTHER)
Admission: RE | Admit: 2019-01-31 | Discharge: 2019-01-31 | Disposition: A | Discharge status: Home / Self Care | Payer: PPO | Source: Ambulatory Visit | Attending: Internal Medicine | Admitting: Internal Medicine

## 2019-01-31 DIAGNOSIS — R05 Cough: Secondary | ICD-10-CM

## 2019-01-31 DIAGNOSIS — R059 Cough, unspecified: Secondary | ICD-10-CM

## 2019-01-31 DIAGNOSIS — J84112 Idiopathic pulmonary fibrosis: Secondary | ICD-10-CM | POA: Diagnosis not present

## 2019-01-31 DIAGNOSIS — J849 Interstitial pulmonary disease, unspecified: Secondary | ICD-10-CM

## 2019-02-01 ENCOUNTER — Telehealth: Payer: Self-pay | Admitting: Internal Medicine

## 2019-02-01 NOTE — Telephone Encounter (Signed)
  LEt [name] know that there   On CT sinus - normal   on CT chest - there is no cancer. Just mild fibrosis worsening in 3 years. This is good news. Issue appears that the breathing tubes with aging and fibrosis is more floppy than 3 years ago and probably contributing to cough  Plan  - continue current Rx plan - [facility] will be contacting him about cough study  THanks  MR   Ct Chest High Resolution  Result Date: 01/31/2019 CLINICAL DATA:  Follow-up interstitial lung disease/IPF, completed clinical trial. Clinically stable respiratory symptoms. EXAM: CT CHEST WITHOUT CONTRAST TECHNIQUE: Multidetector CT imaging of the chest was performed following the standard protocol without intravenous contrast. High resolution imaging of the lungs, as well as inspiratory and expiratory imaging, was performed. COMPARISON:  03/11/2016 high-resolution chest CT. 12/11/2016 chest radiograph. FINDINGS: Cardiovascular: Top-normal heart size. No significant pericardial effusion/thickening. Left anterior descending and left circumflex coronary atherosclerosis. Atherosclerotic nonaneurysmal thoracic aorta. Stable top-normal main pulmonary artery (3.1 cm diameter). Mediastinum/Nodes: No discrete thyroid nodules. Unremarkable esophagus. No pathologically enlarged axillary, mediastinal or hilar lymph nodes, noting limited sensitivity for the detection of hilar adenopathy on this noncontrast study. Lungs/Pleura: No pneumothorax. No pleural effusion. No acute consolidative airspace disease, lung masses or significant pulmonary nodules. There is patchy confluent subpleural reticulation and ground-glass attenuation throughout both lungs with associated moderate traction bronchiectasis and architectural distortion. There is a basilar predominance to these findings. No frank honeycombing. Findings have minimally progressed since 03/11/2016 chest CT. 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 Upper abdomen: No acute abnormality. Musculoskeletal: No aggressive appearing focal osseous lesions. Mild thoracic spondylosis. IMPRESSION: 1. 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 Findings are consistent with UIP per consensus guidelines: Diagnosis of Idiopathic Pulmonary Fibrosis: An Official ATS/ERS/JRS/ALAT Clinical Practice Guideline. Arial, Iss 5, [phone], Aug 14 2017. 2. 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 3. Two-vessel coronary atherosclerosis. Aortic Atherosclerosis (ICD10-I70.0). Electronically Signed   By: Ilona Sorrel M.D.   On: 01/31/2019 12:24   [place] [facility]  Result Date: 01/31/2019 CLINICAL DATA:  Idiopathic pulmonary fibrosis. Cough. Assess for sinus disease. EXAM: CT PARANASAL SINUS LIMITED WITHOUT CONTRAST TECHNIQUE: Non-contiguous multidetector CT images of the paranasal sinuses were obtained in a single plane without contrast. COMPARISON:  None. FINDINGS: Selected images show the frontal, ethmoid, sphenoid and maxillary sinuses to be entirely clear. No mucosal thickening, polyp, cyst, fluid or mass. IMPRESSION: Normal limited study of the sinuses. No evidence of sinus inflammatory disease. Electronically Signed   By: Nelson Chimes M.D.   On: 01/31/2019 12:37

## 2019-02-02 NOTE — Telephone Encounter (Signed)
Advised pt of results. Pt understood and nothing further is needed.   

## 2019-02-02 NOTE — Telephone Encounter (Signed)
Attempted to call pt but unable to reach. Left message for pt to return call.

## 2019-02-02 NOTE — Telephone Encounter (Signed)
Pt is calling back [phone]

## 2019-02-13 ENCOUNTER — Telehealth: Payer: Self-pay | Admitting: Internal Medicine

## 2019-02-13 NOTE — Telephone Encounter (Signed)
Called [facility] and spoke with [place] letting her know that pt reached out to [place] needing his OFEV refilled due to being out of med.  Per [place], the Rx on file has expired and they would need a new Rx in order to be able to take care of refilling med for pt. Per [place], a verbal Rx can be provided.  Transferred to pharmacy and spoke with Elta [place] gave a verbal Rx of pt's OFEV. Per Elta [place], have pt call pharmacy and they should be able to get shipment scheduled for pt.  Called and spoke with pt letting him know the above info and stated to him to call [facility] and they should be able to expedite a shipment of med for him. Pt expressed understanding. Nothing further needed.

## 2019-02-16 ENCOUNTER — Encounter: Payer: Self-pay | Admitting: Internal Medicine

## 2019-02-16 ENCOUNTER — Telehealth: Payer: Self-pay | Admitting: Internal Medicine

## 2019-02-16 ENCOUNTER — Ambulatory Visit (INDEPENDENT_AMBULATORY_CARE_PROVIDER_SITE_OTHER): Payer: PPO | Admitting: Internal Medicine

## 2019-02-16 VITALS — BP 116/62 | HR 68 | Ht 67.0 in | Wt 176.0 lb

## 2019-02-16 DIAGNOSIS — R05 Cough: Secondary | ICD-10-CM | POA: Diagnosis not present

## 2019-02-16 DIAGNOSIS — J84112 Idiopathic pulmonary fibrosis: Secondary | ICD-10-CM

## 2019-02-16 DIAGNOSIS — J398 Other specified diseases of upper respiratory tract: Secondary | ICD-10-CM

## 2019-02-16 DIAGNOSIS — R053 Chronic cough: Secondary | ICD-10-CM

## 2019-02-16 MED ORDER — FLUTICASONE PROPIONATE 50 MCG/ACT NA SUSP: 2.0000 | Freq: Every day | NASAL | 5 refills | Status: DC

## 2019-02-16 NOTE — Telephone Encounter (Signed)
So noted. Has the survival from diagnosis changed much?GT

## 2019-02-16 NOTE — Progress Notes (Signed)
#IPF - uip path 03/22/13  - On OFEV since early May 2015   PFT FVC fev1 ratio BD fev1 TLC DLCO Walk test 172f x 3 laps wt rx             Spring 2014 2.9:L L/% /%        June 2015 2.7L/67% 2.34L/78% 86  3.68/57% 20.24/71%   ofev start  [name]  2016 2.5L/55% 2.1L/60% 84/108%    No desat, Pk HR 130    02/11/2015 Screening visit afferent cough study 2.59L/56% 2.24L/63% 87/112%    Dx with afluttter on ekg   Screen failed due to hx of stones  03/26/2015        No desat, PK HR 130    08/26/15 pft machine 2.73L/68% 2.38L/80% 87   [name]     06/29/2016 Office spiro 2.49L/56% 2.14L/65%     Pk HR 90 and lowest pulse ox 99% ->93%  ofev  11/02/2016  office full pft  2.61L/66% 2.3L/79%    17.23/60%   pfev  09/16/2017  2.53L/64%      Pulse ox 99% -> 93%, HR 81- > 84  ofev  12/22/2017        100% -> 97%,  HR 66 -> 96    09/19/2018        100% -> 93, HR 74 -> 85         OV 03/26/2015  Chief Complaint  Patient presents with  . Follow-up    [name], dry cough. CATH procedure on 04/08/15. Denies any chest tightnes/congestion.    73year old male with idiopathic pulmonary fibrosis. He is on Ofev after having failed Esbriet in the past due to side effects. He is tolerating Ofev well. Liver function test in March 2016 was normal. I last saw him in January 2016. Subsequently in February 2016 we screened him for a cough idiopathic pulmonary fibrosis study called afferent but he screen failed due to history of renal stones. At this time he was incidentally diagnosed with new onset atrial flutter. He has seen Dr. GCrissie Sicklesand has been started on anticoagulation with Xarelto and metoprolol. He is due to undergo a ablation. He is not aware of the increased bleeding risk with Ofev in the setting of anticoagulation. He assures me that the anti-coag and is only until he undergoes ablation and after that the plan is to stop it. Therefore only short-term anticoagulation with Xarelto.  Overall his effort tolerance is fine. He has postponed his [facility] lung transplant until he completes his ablation.  Walking desaturation test in the office today he did not desaturate. This is stable. Spirometry not done   Past medical history: Atrial flutter new diagnoses as above   OV 06/29/2016  Chief Complaint  Patient presents with  . Follow-up    pt states he is at baseline: sob with exertion, nonprod cough.      73year old male with idiopathic pulmonary fibrosis. He is on Ofev . He completed 6 months of PRAISE study (([id number] placebo) in l;ate winter/early spring 2017. This is [place]. Overall stable. No worsening dyspnea and cough. Continues daily exercise is walking many miles. Some days he is sitting more than the others. He is interested in more trials and results of the praise study. These results are not out yet. He is compliant with his Ofe last liver function test was in February 2017. There are no new issues. He believes his atrial fibrillation is  in sinus rhythm;    OV  11/02/2016  Chief Complaint  Patient presents with  . Follow-up    4 month IPF follow up and B&A review - does report increased prod cough with yellow mucus, wheezing, tightness in chest, increased SOB, x3-4 weeks with the weather change.  denies any f/c/s, hemoptysis, chest pain   IPF - on ofev. Last liver function test was in July 2017 and normal. Overall dyspnea stable. However he is been having diarrhea with ofev. He called in and we reduced it to 1 tablet a day and this improved the diarrhea. He back on 1 tablet twice a day. The diarrhea has returned although it is much milder. He has never taken any medication for this. Are function test shows slight improvement from July 2017 and similar levels to approximately one year ago  The new issue that he's having significant recurrent sinus infection in the back of chronic postnasal drainage. This since the fall 2017. He says that he and his wife catch  respirator infection and passive back and forth to each other. He is having cough, chest tightness, postnasal drip and yellow sputum. It is not affecting any dyspnea. There is no wheeze. There is no fever or hemoptysis.  OV 03/02/2017  Chief Complaint  Patient presents with  . Follow-up    Pt states his breathing is at baseline. Pt c/o dry cough - pt states this is baseline. Pt denies CP/tightness.     Idiopathic pulmonary fibrosis on Ofev. Last seen November 2017. He is a routine follow-up.  He is here with his wife. His sinus infections a result. He is interested in research protocols but we have none right now. He is tolerating his Ofev associated with some mild diarrhea occasional which she takes medication. He is not much of a problem. He tells me that she walks 6 times a week for 5 miles a day. On the treadmill at a 5 incline walking at 3-1/2 miles an hour. For this he feels a little more dyspneic than before. Walking desaturation test 185 feet 3 laps on room air: His pulse ox dropped from 98% at rest and 93% with exertion. His heart rate jumped from 66 at rest and 97 at peak exertion. Features are suggestive of mild progression of the disease. He is not attending pulmonary fibrosis foundation because he felt this was negative but that was years ago. We discussed this and he might be interested again.     OV 09/16/2017  Chief Complaint  Patient presents with  . Follow-up    PFT done today. Pt states that his breathing is gradually getting worse. SOB which depends if it is on exertion vs all the time and c/o prod cough with yellow mucus. Denies any CP   S IPF on fev followup. Overall doing well. He recent bronchitis and liked anabiotic and prednisone. He felt the prednisone helped his cough just currently rated as mild to moderate in severity. Helped the shortness of breath. He also helped arthralgia. He is now wondering what taking chronic prednisone. We had an extensive discussion  about this. Spirometry shows that the FVC stable compared to earlier this year but slightly progressive compared to one year ago. [facility] interstitial lung disease questionnaire shows symptoms. His wife is here with him. He is interested in research trials. He has participated in distress before. He is asking about opioid refill for cough if I wont do prednisone.   OV 12/22/2017  Chief Complaint  Patient  presents with  . Follow-up    Pt states he believes his breathing is at a stable point right now. States that he is coughing which is worse when he first wakes up and then also at night; occ will cough up yellow phlegm.     C.o ofev intolerance with fatigue, weight loss, diarrhea. Does not like lomotil. Wnaant to give it a holiday foir 2-3 weeks. INterested in research protocol - discussed [place]. 5 year cut off since diagnosis coming up 03/22/18. He is overall frustrated ith qualtiy of life. Walks 5 miles; starts feeling dizzy > 3 miles in but not checking pulse ox despite advice. Walking desaturation test on 12/22/2017 185 feet x 3 laps on ROOM AIR:  did noit desaturate. Rest pulse ox was 100%, final pulse ox was 97%. HR response 66/min at rest to 96/min at peak exertion. Patient Finnis Leverett  Did not Desaturate < 88% . Taron Novicki yes  Desaturated </= 3% points. Domonic Regula yes did get tachyardic     Walking desaturation test on 02/01/2018 185 feet x 3 laps on ROOM AIR:  did not desaturate. Rest pulse ox was 100%, final pulse ox was 91%. HR response 67/min at rest to 85/min at peak exertion. Patient Ayham Madrid  dod not Desaturate < 88% . [place] yes diod  Desaturated </= 3% points. Rashaad Britz did not get tachyardic   OV 02/01/2018  Chief Complaint  Patient presents with  . Follow-up    Pt states he has been doing good since last visit. Started back on OFEV 12/24/17 after taking a holiday off of it.     FU IPF  Took ofev holiday. And then restarted 01/24/18 and doing well.  [place] ofev ok. KBILD ok.   OV 03/16/2018'  . Chief Complaint  Patient presents with  . Follow-up    PFT done today. States he has been doing well and denies any current complaints. Tolerating OFEV well and has been walking about 5 miles every day with no complaints.   Ms. Pineo returns for follow-up of idiopathic pulmonary fibrosis.  He is now back on nintedanib after giving it a holiday for a few weeks.  He is tolerating it well and no problems.  He goes for daily walks and has no problems.  His lung function was reviewed today and it shows for the first time a significant decline of greater than 200 cc between 2 time points.  This correlates with him not taking over for a few weeks nevertheless overall he is happy with his health.  He is not interested in research trials anymore.  He has a new question about travel advice encounter.  He plans an [place] from [place], [place] intra[place]junal in [place] for 10 days.  This will start on May 22, 2018.  He wants to make sure it is safe for him to go.  He said he has never been on a cruise and he and his wife have a strong desire to go as part of his goals of care.   OV 05/16/2018  Chief Complaint  Patient presents with  . Follow-up    Breathing is unchanged since last OV.    [name] , 73 y.o. , with dob 01/10/1946 and male ,Not Hispanic or Latino from [address] [place] [address] - presents to pulm ILD clinic for IPF.  He presents with his wife.  And just under 7 days he is going to embark on a [place]  cruise.  This visit is precautionary visit just before that.  Most recent visit was in April 2019.  At this point in time in terms of his IPF he feels stable.  He is compliant with full dose of 5 and is having only mild diarrhea.  Other than that he is tolerating it just fine.  He is looking forward to his cruise.  He had pulmonary function test today that shows an improvement compared to last visit and it  is very similar to summer/fall 2018 in terms of FVC and DLCO.  Although he does feel like he is coming down with an acute bronchitis and wants to take some antibiotic and prednisone I had of the trip.  We also discussed about him having prophylactic antibiotics handy.  He wants a refill on his Tussionex for cough.  At this point in time he is past 5 years of diagnosis and not eligible for research trials he is not interested in transplant although the recent pulmonary fibrosis foundation meeting he did hear from a lot of transplant patients about the individual personal experience.       OV 09/19/2018  Subjective:  Patient ID: [name], male , DOB: [birth year] , age 14 y.o. , MRN: [id number] , ADDRESS: [place] [id number]   09/19/2018 -   Chief Complaint  Patient presents with  . Follow-up    Pt saw cards 08/23/18. States he is still having problems with his heart going in and out of rhythm and states he has had some episodes to where he almost passes out. States his breathing is at a stable point. States he also has  an occ cough.     HPI Alyn Zuluaga 73 y.o. -presents 5-year follow-up.  I personally saw him in June 2019.  Then approximately a month ago he presented and saw acutely my nurse practitioner because of dizziness.  It was felt to be A. fib RVR.  He subsequently saw Dr. Crissie Sickles his electrophysiologist.  I reviewed the note.  Flecainide was started.  He says despite that l approximately 10 weeks ago he had another episode of dizziness while getting out of the shower.  He was tachycardic with a heart rate of 140s.  He felt he might pass out but there is no focal neurologic deficits or abnormal sensation.  His pulse ox was normal at 97% at that time.  It happened at rest.  He says he has been walking on the treadmill for several miles and he does not desaturate.  He says his pulse ox is 93% at that time.  He never drops into the 80s.  He does not think his dizziness  and palpitation issues are related to his pulmonary fibrosis.  However [name] is wondering about oxygen drops during this time.  He is not tolerating his nintedanib at full dose without any problems.  He has had a successful [place] trip.  He is participating in the patient support group.  He is up-to-date with his flu shot      OV 10/13/2018  Subjective:  Patient ID: [name], male , DOB: 06/21/1946 , age 46 y.o. , MRN: [id number] , ADDRESS: [place] [id number]   10/13/2018 -   Chief Complaint  Patient presents with  . Follow-up    review PFT.  c/o baseline sob with exertion.       HPI Shadman Elster 73 y.o. -presents for follow-up of IPF to the ILD clinic.  This visit is arranged because letter physiologist [name] was concerned about hypoxemia effects on his atrial fibrillation.  At this point in time patient tells me that after increasing dose of flecainide his atrial fibrillation and palpitations have improved and resolved.  Overall he feels stable.  On September 26, 2018 he had 6-minute walk test and this was pretty robust with walking 384 meters and with lowest pulse ox of 95%.  Then on October 05, 2018 he had overnight pulse oximetry that shows 12 minutes of desaturation less than 88%.  He is not interested in oxygen.  Then he called his October 07, 2018 with worsening bronchitis episodes and we gave him 5 days of prednisone and antibiotics.  He finished his last dose yesterday.  Overall he feels stable but pulmonary function test today shows decline in both FVC and DLCO.  And then when I questioned him he felt like he still had some residual bronchiti and feels another course of prednisone could help him.  There are no other new issues.  Last liver function test October 2019 and was normal.      OV 01/12/2019  Subjective:  Patient ID: [name], male , DOB: 1946-02-26 , age 57 y.o. , MRN: [id number] , ADDRESS: [place]  [id number]   01/12/2019 -   Chief Complaint  Patient presents with  . Follow-up    PFT performed today. Pt states he is about the same since last visit.States he still becomes SOB with exertion, has a lot of coughing but has been taking mucinex, and also has had some CP or chest tightness.     HPI Shin Isley 73 y.o. -returns for follow-up of his IPF.  He is now more than 5 years since diagnosis.,  April 2020 he will be 6 years since diagnosis.  He continues to exercise well on the treadmill and according to his wife he is does well.  He is not dropping oxygen at this time.  He does not have a cough when needed works on the tile.  However at other times the day especially when he is trying to lie down to bed or when he gets up in the morning and goes to the bathroom he has really bad cough.  Overall the cough severity is now 6 out of 10 in terms of how it is impacting his quality of life.  He tells me that every time he takes prednisone for the cough he feels better but then when he comes off prednisone cough gets worse.  He said multiple prednisone courses of brief duration for this chronic cough.  In addition for the last few weeks has had yellow sinus drainage and sinus headache and sinus congestion and he feels this is again making his chronic cough worse.  He is open to taking antibiotic and prednisone course.  In terms of taking nintedanib he has no side effects.  His last liver function test reviewed was in October 2019 and it was normal at that time.  Overall at this point in time he really wants good significant support for his cough in terms of affecting his quality of life.    ROS - per HPI     OV 02/16/2019  Subjective:  Patient ID: [name], male , DOB: 1946-11-25 , age 37 y.o. , MRN: [id number] , ADDRESS: [address] [place] [id number]   02/16/2019 -   Chief Complaint  Patient presents with  . Follow-up    HRCT  and sinus CT performed 2/18. Pt states he is about the same  as last visit and states SOB is about the same. Pt still has a dry cough. Denies any complaints of CP/chest tightness.     HPI Leshon Spano 73 y.o. -returns for follow-up of his IPF.  He presents with his wife.  He is here to review the test results.  He had high-resolution CT chest and CT sinus.  These were done in order to reevaluate his disease because he is having significant cough.  His high-resolution CT chest shows only mild progression in his IPF in 3 years.  However there is significant new findings of tracheobronchomalacia although there is no lung cancer.  He also has two-vessel coronary artery calcification.  These are new findings.  Since last visit and a sinus episode he is stable.  He is currently run out of his nasal steroids.  He says the pharmacy said that I declined his prescription request.  I do not recollect such a conversation.  In any event he is now going to participate in the cough study called scenic.  He has received a copy of the consent form.  He is rated.  He has an appointment for his consent visit.  It is possible that the nasal steroid is on the exclusion list but I do not have the exclusion list with me.  Anyway he is not consented.  He is not having chest pain when he does his treadmill exercises.  He works out hard.  He states he gets tachycardic.  He says he has a 6 times that he will die from his heart condition before his pulmonary fibrosis.  He has upcoming appointment with his cardiologist [name] for atrial fibrillation.   SYMPTOM SCALE - ILD 02/16/2019   O2 use ra  Shortness of Breath 0 -> 5 scale with 5 being worst (score 6 If unable to do)  At rest 1  Simple tasks - showers, clothes change, eating, shaving 2  Household (dishes, doing bed, laundry) 2  Shopping 3  Walking level at own pace 2  Walking keeping up with others of same age 86  Walking up Stairs 4  Walking up Hill 4  Total (40 - 48) Dyspnea Score 19  How bad is your cough? 3  How bad is  your fatigue 3       Results for POSEY, PETRIK (MRN [id number]) as of 09/16/2017 09:43  Ref. Range 05/14/2014 15:46 07/24/2015 12:43 08/26/2015 10:52 11/02/2016 09:51 05/19/2017 08:41 09/16/2017 08:47 03/16/17 05/16/2018  10/13/2018  01/12/2019   FVC-Pre Latest Units: L 2.71 2.69 2.73 2.61 2.51 2.53 2.29 2.38 2.25 2.21  FVC-%Pred-Pre Latest Units: % 67 67 68 66 63 64 58 60 57% 56%   Results for LOCKLAN, CANOY (MRN [id number]) as of 09/16/2017 09:43  Ref. Range 05/14/2014 15:46 07/24/2015 12:43 08/26/2015 10:52 11/02/2016 09:51 05/19/2017 08:41 09/16/2017 08:47 03/16/2018  05/16/2018  10/13/2018  01/12/2019   DLCO unc Latest Units: ml/min/mmHg 20.24 15.36 15.36 17.23 15.83  13.14 16.48 14.17 14.38  DLCO unc % pred Latest Units: % 71 54 54 60 55  46 58 50% 50%     Simple office walk 185 feet x  3 laps goal with forehead probe 09/19/2018  01/12/2019  02/16/2019   O2 used Room air Room air Room air  Number laps completed _0 Comments about pace normal normal normal  Resting Pulse Ox/HR 100% and 73/min 100% ad 74/min 100% and 68/min  Final Pulse Ox/HR 93% and 85/min 92% and 84/min 93% and 81/min  Desaturated </= 88% no no no  Desaturated <= 3% points yes Yes, 8 ponts Yes, 7 points  Got Tachycardic >/= 90/min no no no  Symptoms at end of test x none none  Miscellaneous comments x x stable        ROS - per HPI     has a past medical history of Allergy, Arthritis, Atrial flutter ([place]), Cataract, GERD (gastroesophageal reflux disease), H/O hiatal hernia, Hyperlipidemia, Injury of right hand, Pulmonary fibrosis ([place]), Renal stone (06/2014), and Ulcer.   reports that he quit smoking about 40 years ago. His smoking use included cigarettes. He has a 16.00 pack-year smoking history. He has never used smokeless tobacco.  Past Surgical History:  Procedure Laterality Date  . ATRIAL FLUTTER ABLATION N/A 04/08/2015   Procedure: ATRIAL FLUTTER ABLATION;  Surgeon: Evans Lance, MD;  Location[facility] CATH LAB;   Service: Cardiovascular;  Laterality: N/A;  . CATARACT EXTRACTION, BILATERAL  2016  . COLONOSCOPY  03/01/2017   per Dr. Loletha Carrow, clear, repeat in 5 yrs (brother had colon cancer)   . ESOPHAGOGASTRODUODENOSCOPY  2007  . EXTRACORPOREAL SHOCK WAVE LITHOTRIPSY  06-17-14   per Dr. Jeffie Pollock   . HAND SURGERY Right   . LUNG BIOPSY Left 03/22/2013   Procedure: LUNG BIOPSY;  Surgeon: Melrose Nakayama, MD;  Location: [place];  Service: Thoracic;  Laterality: Left;  . POLYPECTOMY    . UPPER GASTROINTESTINAL ENDOSCOPY    . VIDEO ASSISTED THORACOSCOPY Left 03/22/2013   Procedure: VIDEO ASSISTED THORACOSCOPY;  Surgeon: Melrose Nakayama, MD;  Location: [place];  Service: Thoracic;  Laterality: Left;    No Known Allergies  Immunization History  Administered Date(s) Administered  . Influenza Split 09/13/2013  . Influenza, High Dose Seasonal PF 10/06/2016, 09/16/2017, 08/17/2018  . Influenza-Unspecified 09/07/2014, 09/14/2015  . Pneumococcal Conjugate-13 02/16/2017  . Pneumococcal Polysaccharide-23 05/26/2013  . Td 12/15/2007  . Tdap 02/23/2018  . Zoster 02/19/2014  . Zoster Recombinat (Shingrix) 10/13/2018    Family History  Problem Relation Age of Onset  . Liver cancer Mother   . Diabetes Sister   . Lung cancer Sister   . Heart disease Brother   . Diabetes Brother   . Colon cancer Brother   . Diabetes Unknown   . Cancer Unknown        prostate  . Colon polyps Neg Hx   . Esophageal cancer Neg Hx   . Rectal cancer Neg Hx   . Stomach cancer Neg Hx      Current Outpatient Medications:  .  chlorpheniramine-HYDROcodone (TUSSIONEX PENNKINETIC ER) 10-8 MG/5ML SUER, Take 5 mLs by mouth 2 (two) times daily as needed for cough., Disp: 140 mL, Rfl: 0 .  diphenhydrAMINE (BENADRYL) 25 MG tablet, Take 25 mg by mouth every 6 (six) hours as needed for allergies or sleep., Disp: , Rfl:  .  flecainide (TAMBOCOR) 50 MG tablet, Take 1 tablet (50 mg total) by mouth 3 (three) times daily., Disp: 270 tablet,  Rfl: 3 .  fluticasone (FLONASE) 50 MCG/ACT nasal spray, Place 2 sprays into both nostrils daily., Disp: 48 g, Rfl: 5 .  morphine 10 MG/5ML solution, Take 2 mLs (4 mg total) by mouth 2 (two) times daily as needed., Disp: 120 mL, Rfl: 0 .  Nintedanib (OFEV) 150 MG CAPS, Take 1 capsule (150 mg total) by mouth 2 (two) times daily., Disp: 60 capsule, Rfl: 12 .  pantoprazole (PROTONIX) 40  MG tablet, Take 1 tablet (40 mg total) by mouth 2 (two) times daily., Disp: 180 tablet, Rfl: 3 .  zolpidem (AMBIEN) 5 MG tablet, Take 1 tablet (5 mg total) by mouth at bedtime as needed for sleep., Disp: 30 tablet, Rfl: 5  Current Facility-Administered Medications:  .  0.9 %  sodium chloride infusion, 500 mL, Intravenous, Continuous, Danis, Estill Cotta III, MD      Objective:   Vitals:   02/16/19 0916  BP: 116/62  Pulse: 68  SpO2: 100%  Weight: 176 lb (79.8 kg)  Height: _0  (1.702 m)    Estimated body mass index is 27.57 kg/m as calculated from the following:   Height as of this encounter: _1  (1.702 m).   Weight as of this encounter: 176 lb (79.8 kg).  _2 @  [employer]   02/16/19 0916  Weight: 176 lb (79.8 kg)     Physical Exam  General Appearance:    Alert, cooperative, no distress, appears stated age - yes , Deconditioned looking - no , OBESE  - no, Sitting on Wheelchair -  no  Head:    Normocephalic, without obvious abnormality, atraumatic  Eyes:    PERRL, conjunctiva/corneas clear,  Ears:    Normal TM's and external ear canals, both ears  Nose:   Nares normal, septum midline, mucosa normal, no drainage    or sinus tenderness. OXYGEN ON  - no . Patient is @ ra   Throat:   Lips, mucosa, and tongue normal; teeth and gums normal. Cyanosis on lips - no  Neck:   Supple, symmetrical, trachea midline, no adenopathy;    thyroid:  no enlargement/tenderness/nodules; no carotid   bruit or JVD  Back:     Symmetric, no curvature, ROM normal, no CVA tenderness  Lungs:     Distress - no ,  Wheeze no, Barrell Chest - no, Purse lip breathing - no, Crackles - yes   Chest Wall:    No tenderness or deformity.    Heart:    Regular rate and rhythm, S1 and S2 normal, no rub   or gallop, Murmur - no  Breast Exam:    NOT DONE  Abdomen:     Soft, non-tender, bowel sounds active all four quadrants,    no masses, no organomegaly. Visceral obesity - no  Genitalia:   NOT DONE  Rectal:   NOT DONE  Extremities:   Extremities - normal, Has Cane - no, Clubbing - no, Edema - no  Pulses:   2+ and symmetric all extremities  Skin:   Stigmata of Connective Tissue Disease - no  Lymph nodes:   Cervical, supraclavicular, and axillary nodes normal  Psychiatric:  Neurologic:   Pleasant - yes, Anxious - no, Flat affect - no  CAm-ICU - neg, Alert and Oriented x 3 - yes, Moves all 4s - yes, Speech - normal, Cognition - intact           Assessment:       ICD-10-CM   1. IPF (idiopathic pulmonary fibrosis) ([place]) J84.112   2. 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 IPF (idiopathic pulmonary fibrosis) ([place]) J84.112   2. Chronic cough R05   3. 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 Please stay tuned  Tracheobronchomalacia - new  - along with IPF this is one reason for cough - supportive care  Coronary artery calcificatin - new - might need stress test - please d/w Dr Lovena Le; I have messaged him  Followup [facility] later in March 2020 for cough study consent visit 7 months do spirometry/dlco Return to ILD clinic in 7 months      SIGNATURE    Dr. Brand Males, M.D., F.C.C.P,  Pulmonary and Critical Care Medicine Staff Physician, [place]  Director - Interstitial Lung Disease  Program  Pulmonary [place] [facility] [place], [place], [address]  Pager: [phone], If no answer or between  15:00h - 7:00h: call [phone] Telephone: [phone]  10:03 AM 02/16/2019

## 2019-02-16 NOTE — Telephone Encounter (Signed)
Hi Tanner Bishop  You are going to be seeing .Tanner Bishop  -> soon. Wanted to share that his CT has coronary artery calcification. Says he has never had stress test but working out well with treadmill other than feeling his heart pounding and having 6th sense that he will die from heart disease over IPF . Just wanted to share with you  THanks    SIGNATURE    Dr. Brand Males, M.D., F.C.C.P,  Pulmonary and Critical Care Medicine Staff Physician, [place] Director - Interstitial Lung Disease  Program  Pulmonary [place] [facility] [place], [place], [address]  Pager: [phone], If no answer or between  15:00h - 7:00h: call [phone] Telephone: [phone]  9:56 AM 02/16/2019

## 2019-02-16 NOTE — Patient Instructions (Addendum)
ICD-10-CM   1. IPF (idiopathic pulmonary fibrosis) ([place]) J84.112   2. Chronic cough R05   3. 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 Please stay tuned  Tracheobronchomalacia - new  - along with IPF this is one reason for cough - supportive care  Coronary artery calcificatin - new - might need stress test - please d/w Dr Lovena Le; I have messaged him  Followup [facility] later in March 2020 for cough study consent visit 7 months do spirometry/dlco Return to [facility] clinic in 7 months

## 2019-02-16 NOTE — Telephone Encounter (Signed)
eror

## 2019-02-28 ENCOUNTER — Ambulatory Visit (INDEPENDENT_AMBULATORY_CARE_PROVIDER_SITE_OTHER): Payer: PPO | Admitting: Family Medicine

## 2019-02-28 ENCOUNTER — Other Ambulatory Visit: Payer: Self-pay

## 2019-02-28 ENCOUNTER — Encounter: Payer: Self-pay | Admitting: Family Medicine

## 2019-02-28 VITALS — BP 104/64 | HR 74 | Temp 98.0°F | Ht 67.0 in | Wt 170.0 lb

## 2019-02-28 DIAGNOSIS — Z0000 Encounter for general adult medical examination without abnormal findings: Secondary | ICD-10-CM

## 2019-02-28 LAB — POC URINALSYSI DIPSTICK (AUTOMATED)
Bilirubin, UA: NEGATIVE
Glucose, UA: NEGATIVE
Ketones, UA: NEGATIVE
Leukocytes, UA: NEGATIVE
Nitrite, UA: NEGATIVE
Protein, UA: POSITIVE — AB
Spec Grav, UA: 1.025 (ref 1.010–1.025)
Urobilinogen, UA: 0.2 E.U./dL
pH, UA: 6 (ref 5.0–8.0)

## 2019-02-28 LAB — CBC WITH DIFFERENTIAL/PLATELET
Basophils Absolute: 0.1 10*3/uL (ref 0.0–0.1)
Basophils Relative: 0.8 % (ref 0.0–3.0)
Eosinophils Absolute: 0.2 10*3/uL (ref 0.0–0.7)
Eosinophils Relative: 2.6 % (ref 0.0–5.0)
HCT: 45.8 % (ref 39.0–52.0)
Hemoglobin: 15.3 g/dL (ref 13.0–17.0)
Lymphocytes Relative: 30.6 % (ref 12.0–46.0)
Lymphs Abs: 2.6 10*3/uL (ref 0.7–4.0)
MCHC: 33.5 g/dL (ref 30.0–36.0)
MCV: 97 fl (ref 78.0–100.0)
Monocytes Absolute: 1.2 10*3/uL — ABNORMAL HIGH (ref 0.1–1.0)
Monocytes Relative: 13.9 % — ABNORMAL HIGH (ref 3.0–12.0)
Neutro Abs: 4.4 10*3/uL (ref 1.4–7.7)
Neutrophils Relative %: 52.1 % (ref 43.0–77.0)
Platelets: 272 10*3/uL (ref 150.0–400.0)
RBC: 4.73 Mil/uL (ref 4.22–5.81)
RDW: 14.5 % (ref 11.5–15.5)
WBC: 8.5 10*3/uL (ref 4.0–10.5)

## 2019-02-28 LAB — HEPATIC FUNCTION PANEL
ALT: 11 U/L (ref 0–53)
AST: 19 U/L (ref 0–37)
Albumin: 4.3 g/dL (ref 3.5–5.2)
Alkaline Phosphatase: 57 U/L (ref 39–117)
Bilirubin, Direct: 0.1 mg/dL (ref 0.0–0.3)
Total Bilirubin: 0.6 mg/dL (ref 0.2–1.2)
Total Protein: 7.2 g/dL (ref 6.0–8.3)

## 2019-02-28 LAB — LIPID PANEL
Cholesterol: 229 mg/dL — ABNORMAL HIGH (ref 0–200)
HDL: 62.7 mg/dL (ref >39.00)
LDL Cholesterol: 148 mg/dL — ABNORMAL HIGH (ref 0–99)
NonHDL: 166.01
Total CHOL/HDL Ratio: 4
Triglycerides: 88 mg/dL (ref 0.0–149.0)
VLDL: 17.6 mg/dL (ref 0.0–40.0)

## 2019-02-28 LAB — BASIC METABOLIC PANEL
BUN: 15 mg/dL (ref 6–23)
CO2: 34 mEq/L — ABNORMAL HIGH (ref 19–32)
Calcium: 9.4 mg/dL (ref 8.4–10.5)
Chloride: 100 mEq/L (ref 96–112)
Creatinine, Ser: 1.17 mg/dL (ref 0.40–1.50)
GFR: 61.32 mL/min (ref >60.00)
Glucose, Bld: 104 mg/dL — ABNORMAL HIGH (ref 70–99)
Potassium: 4.5 mEq/L (ref 3.5–5.1)
Sodium: 140 mEq/L (ref 135–145)

## 2019-02-28 LAB — TSH: TSH: 2.22 u[IU]/mL (ref 0.35–4.50)

## 2019-02-28 LAB — PSA: PSA: 1.78 ng/mL (ref 0.10–4.00)

## 2019-02-28 LAB — VITAMIN D 25 HYDROXY (VIT D DEFICIENCY, FRACTURES): VITD: 14.78 ng/mL — ABNORMAL LOW (ref 30.00–100.00)

## 2019-02-28 MED ORDER — ZOLPIDEM TARTRATE 5 MG PO TABS: 5.0000 mg | ORAL_TABLET | Freq: Every evening | ORAL | 5 refills | Status: DC | PRN

## 2019-02-28 MED ORDER — PANTOPRAZOLE SODIUM 40 MG PO TBEC: 40.0000 mg | DELAYED_RELEASE_TABLET | Freq: Two times a day (BID) | ORAL | 3 refills | Status: DC

## 2019-02-28 NOTE — Progress Notes (Signed)
   Subjective:    Patient ID: Tanner Bishop, male    DOB: November 01, 1946, 73 y.o.   MRN: [id number]  HPI Here for a well exam. He feels well in general except for some mild fatigue. No real SOB. He sees [name] frequently for IPF.    Review of Systems  Constitutional: Positive for fatigue.  HENT: Negative.   Eyes: Negative.   Respiratory: Negative.   Cardiovascular: Negative.   Gastrointestinal: Negative.   Genitourinary: Negative.   Musculoskeletal: Negative.   Skin: Negative.   Neurological: Negative.   Psychiatric/Behavioral: Negative.        Objective:   Physical Exam Constitutional:      General: He is not in acute distress.    Appearance: He is well-developed. He is not diaphoretic.  HENT:     Head: Normocephalic and atraumatic.     Right Ear: External ear normal.     Left Ear: External ear normal.     Nose: Nose normal.     Mouth/Throat:     Pharynx: No oropharyngeal exudate.  Eyes:     General: No scleral icterus.       Right eye: No discharge.        Left eye: No discharge.     Conjunctiva/sclera: Conjunctivae normal.     Pupils: Pupils are equal, round, and reactive to light.  Neck:     Musculoskeletal: Neck supple.     Thyroid: No thyromegaly.     Vascular: No JVD.     Trachea: No tracheal deviation.  Cardiovascular:     Rate and Rhythm: Normal rate and regular rhythm.     Heart sounds: Normal heart sounds. No murmur. No friction rub. No gallop.   Pulmonary:     Effort: Pulmonary effort is normal. No respiratory distress.     Breath sounds: Normal breath sounds. No wheezing or rales.  Chest:     Chest wall: No tenderness.  Abdominal:     General: Bowel sounds are normal. There is no distension.     Palpations: Abdomen is soft. There is no mass.     Tenderness: There is no abdominal tenderness. There is no guarding or rebound.  Genitourinary:    Penis: Normal. No tenderness.      Prostate: Normal.     Rectum: Normal. Guaiac result negative.   Musculoskeletal: Normal range of motion.        General: No tenderness.  Lymphadenopathy:     Cervical: No cervical adenopathy.  Skin:    General: Skin is warm and dry.     Coloration: Skin is not pale.     Findings: No erythema or rash.  Neurological:     Mental Status: He is alert and oriented to person, place, and time.     Cranial Nerves: No cranial nerve deficit.     Motor: No abnormal muscle tone.     Coordination: Coordination normal.     Deep Tendon Reflexes: Reflexes are normal and symmetric. Reflexes normal.  Psychiatric:        Behavior: Behavior normal.        Thought Content: Thought content normal.        Judgment: Judgment normal.           Assessment & Plan:  Well exam. We discussed diet and exercise. Get fasting labs. Alysia Penna, MD

## 2019-03-03 ENCOUNTER — Encounter: Payer: PPO | Admitting: Internal Medicine

## 2019-03-18 ENCOUNTER — Other Ambulatory Visit: Payer: Self-pay | Admitting: Family Medicine

## 2019-03-28 ENCOUNTER — Ambulatory Visit: Payer: PPO | Admitting: Internal Medicine

## 2019-05-18 ENCOUNTER — Telehealth: Payer: Self-pay

## 2019-05-18 NOTE — Telephone Encounter (Signed)
Copied from [place] [id number]. Topic: General - Call Back - No Documentation >> May 18, 2019 11:32 AM Erick Blinks wrote: Reason for CRM: Pt needs generic medication, so insurance will cover. Boltarin -arthritis cream. Call back requested

## 2019-05-30 ENCOUNTER — Telehealth: Payer: Self-pay | Admitting: Internal Medicine

## 2019-05-30 MED ORDER — PREDNISONE 10 MG PO TABS: ORAL_TABLET | ORAL | 0 refills | Status: DC

## 2019-05-30 NOTE — Telephone Encounter (Signed)
I have sent Patrice a message to open MR's schedule 6/25 afternoon only for ILD patients. Once it has been opened, I will call pt to get him on schedule to see MR.

## 2019-05-30 NOTE — Telephone Encounter (Signed)
Seems low risk for covid  Not sure what is going on  Plan Can try Please take Take prednisone 28m once daily x 3 days, then 320monce daily x 3 days, then 2065mnce daily x 3 days, then prednisone 38m81mce daily  x 3 days and stop  If getting worse to go to ER  Last PFT [name] 2020 - he should have spirometry (no need for dlco) and simple walk test in 2-3 weeks and then do viritual or face to face with me   Please open clinic _ ILD only - 30 min slots  July 25th Thursday  ..Marland Kitchen

## 2019-05-30 NOTE — Telephone Encounter (Signed)
TN is out of office today but MR is at the office. Routing to MR to follow up on. MR, please advise on this for pt. Thanks!

## 2019-05-30 NOTE — Telephone Encounter (Signed)
Spoke with pt, advised him that I sent prednisone into [facility]. Pt understood. I am awaiting Patrice to open MR schedule up on 06/08/2019 for the afternoon appts.

## 2019-05-30 NOTE — Telephone Encounter (Signed)
Primary Pulmonologist: MR Last office visit and with whom: 02/16/2019 with MR What do we see them for (pulmonary problems): IPF Last OV assessment/plan: Patient Instructions by Brand Males, MD at 02/16/2019 9:30 AM Author: Brand Males, MD Author Type: Physician Filed: 02/16/2019 10:03 AM  Note Status: Addendum Cosign: Cosign Not Required Encounter Date: 02/16/2019  Editor: Brand Males, MD (Physician)  Prior Versions: 1. Brand Males, MD (Physician) at 02/16/2019 10:00 AM - Addendum   2. Brand Males, MD (Physician) at 02/16/2019 9:58 AM - Addendum   3. Brand Males, MD (Physician) at 02/16/2019 9:57 AM - Addendum   4. Brand Males, MD (Physician) at 02/16/2019 9:36 AM - Signed      ICD-10-CM   1. IPF (idiopathic pulmonary fibrosis) ([place]) J84.112   2. Chronic cough R05   3. 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 Please stay tuned  Tracheobronchomalacia - new  - along with IPF this is one reason for cough - supportive care  Coronary artery calcificatin - new - might need stress test - please d/w Dr Lovena Le; I have messaged him  Followup [facility] later in March 2020 for cough study consent visit 7 months do spirometry/dlco Return to ILD clinic in 7 months     Was appointment offered to patient (explain)?  Pt wants meds prescribed   Reason for call: Called and spoke with pt who stated his breathing became worse yesterday, 6/15 and states that it is worse this morning. Pt believes his breathing is worse due to the change in the weather.  Pt denies any complaints of fever as his temp was 98 when he checked it.  Pt is still taking OFEV and still using Flonase nasal spray twice daily but  still has problems with a stuffy nose. Pt also has a cough and states his cough is a dry cough.  Pt is wanting to have something prescribed to help with his symptoms. Tonya, please advise on this for pt. Thanks!   (examples of things to ask: : When did symptoms start? Fever? Cough? Productive? Color to sputum? More sputum than usual? Wheezing? Have you needed increased oxygen? Are you taking your respiratory medications? What over the counter measures have you tried?)

## 2019-05-31 NOTE — Telephone Encounter (Signed)
MR's schedule for 06/08/19, is not open at this time.

## 2019-05-31 NOTE — Telephone Encounter (Signed)
Called and spoke with pt to see if I could get him scheduled for OV with MR. Pt has been scheduled for an appt 6/25 at 2pm with MR. Nothing further needed.

## 2019-06-02 MED ORDER — DICLOFENAC SODIUM 1 % TD GEL: 2.0000 g | Freq: Four times a day (QID) | TRANSDERMAL | 5 refills | Status: DC

## 2019-06-02 NOTE — Telephone Encounter (Signed)
Call in Diclofenac 1% gel to apply QID as needed for pain, 30 day supply with 5 rf

## 2019-06-02 NOTE — Telephone Encounter (Signed)
Dr. Sarajane Jews please advise. Thanks

## 2019-06-02 NOTE — Telephone Encounter (Signed)
Refill sent to the pharmacy

## 2019-06-08 ENCOUNTER — Ambulatory Visit (INDEPENDENT_AMBULATORY_CARE_PROVIDER_SITE_OTHER): Payer: PPO | Admitting: Internal Medicine

## 2019-06-08 ENCOUNTER — Encounter: Payer: Self-pay | Admitting: Internal Medicine

## 2019-06-08 ENCOUNTER — Other Ambulatory Visit: Payer: Self-pay

## 2019-06-08 VITALS — BP 120/64 | HR 92 | Temp 97.9°F | Ht 68.0 in | Wt 168.2 lb

## 2019-06-08 DIAGNOSIS — J398 Other specified diseases of upper respiratory tract: Secondary | ICD-10-CM | POA: Diagnosis not present

## 2019-06-08 DIAGNOSIS — J84112 Idiopathic pulmonary fibrosis: Secondary | ICD-10-CM

## 2019-06-08 DIAGNOSIS — R053 Chronic cough: Secondary | ICD-10-CM

## 2019-06-08 DIAGNOSIS — R05 Cough: Secondary | ICD-10-CM | POA: Diagnosis not present

## 2019-06-08 LAB — HEPATIC FUNCTION PANEL
ALT: 15 U/L (ref 0–53)
AST: 17 U/L (ref 0–37)
Albumin: 3.9 g/dL (ref 3.5–5.2)
Alkaline Phosphatase: 53 U/L (ref 39–117)
Bilirubin, Direct: 0.1 mg/dL (ref 0.0–0.3)
Total Bilirubin: 0.4 mg/dL (ref 0.2–1.2)
Total Protein: 6.9 g/dL (ref 6.0–8.3)

## 2019-06-08 NOTE — Patient Instructions (Addendum)
ICD-10-CM   1. IPF (idiopathic pulmonary fibrosis) ([place]) J84.112   2. Chronic cough R05   3. 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

## 2019-06-08 NOTE — Progress Notes (Signed)
#IPF - uip path 03/22/13  - On OFEV since early May 2015   PFT FVC fev1 ratio BD fev1 TLC DLCO Walk test 145f x 3 laps wt rx             Spring 2014 2.9:L L/% /%        June 2015 2.7L/67% 2.34L/78% 86  3.68/57% 20.24/71%   ofev start  [name]  2016 2.5L/55% 2.1L/60% 84/108%    No desat, Pk HR 130    02/11/2015 Screening visit afferent cough study 2.59L/56% 2.24L/63% 87/112%    Dx with afluttter on ekg   Screen failed due to hx of stones  03/26/2015        No desat, PK HR 130    08/26/15 pft machine 2.73L/68% 2.38L/80% 87   [name]     06/29/2016 Office spiro 2.49L/56% 2.14L/65%     Pk HR 90 and lowest pulse ox 99% ->93%  ofev  11/02/2016  office full pft  2.61L/66% 2.3L/79%    17.23/60%   pfev  09/16/2017  2.53L/64%      Pulse ox 99% -> 93%, HR 81- > 84  ofev  12/22/2017        100% -> 97%,  HR 66 -> 96    09/19/2018        100% -> 93, HR 74 -> 85         OV 03/26/2015  Chief Complaint  Patient presents with   Follow-up    [name], dry cough. CATH procedure on 04/08/15. Denies any chest tightnes/congestion.    73year old male with idiopathic pulmonary fibrosis. He is on Ofev after having failed Esbriet in the past due to side effects. He is tolerating Ofev well. Liver function test in March 2016 was normal. I last saw him in January 2016. Subsequently in February 2016 we screened him for a cough idiopathic pulmonary fibrosis study called afferent but he screen failed due to history of renal stones. At this time he was incidentally diagnosed with new onset atrial flutter. He has seen Dr. GCrissie Sicklesand has been started on anticoagulation with Xarelto and metoprolol. He is due to undergo a ablation. He is not aware of the increased bleeding risk with Ofev in the setting of anticoagulation. He assures me that the anti-coag and is only until he undergoes ablation and after that the plan is to stop it. Therefore only short-term anticoagulation with Xarelto.  Overall his effort tolerance is fine. He has postponed his [facility] lung transplant until he completes his ablation.  Walking desaturation test in the office today he did not desaturate. This is stable. Spirometry not done   Past medical history: Atrial flutter new diagnoses as above   OV 06/29/2016  Chief Complaint  Patient presents with   Follow-up    pt states he is at baseline: sob with exertion, nonprod cough.      73year old male with idiopathic pulmonary fibrosis. He is on Ofev . He completed 6 months of PRAISE study (([id number] placebo) in l;ate winter/early spring 2017. This is [place]. Overall stable. No worsening dyspnea and cough. Continues daily exercise is walking many miles. Some days he is sitting more than the others. He is interested in more trials and results of the praise study. These results are not out yet. He is compliant with his Ofe last liver function test was in February 2017. There are no new issues. He believes his atrial fibrillation is  in sinus rhythm;    OV  11/02/2016  Chief Complaint  Patient presents with   Follow-up    4 month IPF follow up and B&A review - does report increased prod cough with yellow mucus, wheezing, tightness in chest, increased SOB, x3-4 weeks with the weather change.  denies any f/c/s, hemoptysis, chest pain   IPF - on ofev. Last liver function test was in July 2017 and normal. Overall dyspnea stable. However he is been having diarrhea with ofev. He called in and we reduced it to 1 tablet a day and this improved the diarrhea. He back on 1 tablet twice a day. The diarrhea has returned although it is much milder. He has never taken any medication for this. Are function test shows slight improvement from July 2017 and similar levels to approximately one year ago  The new issue that he's having significant recurrent sinus infection in the back of chronic postnasal drainage. This since the fall 2017. He says that he and his wife catch  respirator infection and passive back and forth to each other. He is having cough, chest tightness, postnasal drip and yellow sputum. It is not affecting any dyspnea. There is no wheeze. There is no fever or hemoptysis.  OV 03/02/2017  Chief Complaint  Patient presents with   Follow-up    Pt states his breathing is at baseline. Pt c/o dry cough - pt states this is baseline. Pt denies CP/tightness.     Idiopathic pulmonary fibrosis on Ofev. Last seen November 2017. He is a routine follow-up.  He is here with his wife. His sinus infections a result. He is interested in research protocols but we have none right now. He is tolerating his Ofev associated with some mild diarrhea occasional which she takes medication. He is not much of a problem. He tells me that she walks 6 times a week for 5 miles a day. On the treadmill at a 5 incline walking at 3-1/2 miles an hour. For this he feels a little more dyspneic than before. Walking desaturation test 185 feet 3 laps on room air: His pulse ox dropped from 98% at rest and 93% with exertion. His heart rate jumped from 66 at rest and 97 at peak exertion. Features are suggestive of mild progression of the disease. He is not attending pulmonary fibrosis foundation because he felt this was negative but that was years ago. We discussed this and he might be interested again.     OV 09/16/2017  Chief Complaint  Patient presents with   Follow-up    PFT done today. Pt states that his breathing is gradually getting worse. SOB which depends if it is on exertion vs all the time and c/o prod cough with yellow mucus. Denies any CP   S IPF on fev followup. Overall doing well. He recent bronchitis and liked anabiotic and prednisone. He felt the prednisone helped his cough just currently rated as mild to moderate in severity. Helped the shortness of breath. He also helped arthralgia. He is now wondering what taking chronic prednisone. We had an extensive discussion  about this. Spirometry shows that the FVC stable compared to earlier this year but slightly progressive compared to one year ago. [facility] interstitial lung disease questionnaire shows symptoms. His wife is here with him. He is interested in research trials. He has participated in distress before. He is asking about opioid refill for cough if I wont do prednisone.   OV 12/22/2017  Chief Complaint  Patient  presents with   Follow-up    Pt states he believes his breathing is at a stable point right now. States that he is coughing which is worse when he first wakes up and then also at night; occ will cough up yellow phlegm.     C.o ofev intolerance with fatigue, weight loss, diarrhea. Does not like lomotil. Wnaant to give it a holiday foir 2-3 weeks. INterested in research protocol - discussed [place]. 5 year cut off since diagnosis coming up 03/22/18. He is overall frustrated ith qualtiy of life. Walks 5 miles; starts feeling dizzy > 3 miles in but not checking pulse ox despite advice. Walking desaturation test on 12/22/2017 185 feet x 3 laps on ROOM AIR:  did noit desaturate. Rest pulse ox was 100%, final pulse ox was 97%. HR response 66/min at rest to 96/min at peak exertion. Patient Devlon Mcphie  Did not Desaturate < 88% . Jerrid Reaser yes  Desaturated </= 3% points. Suede Dory yes did get tachyardic     Walking desaturation test on 02/01/2018 185 feet x 3 laps on ROOM AIR:  did not desaturate. Rest pulse ox was 100%, final pulse ox was 91%. HR response 67/min at rest to 85/min at peak exertion. Patient Finley Detweiler  dod not Desaturate < 88% . Chuck Renner yes diod  Desaturated </= 3% points. Linzie Mccreery did not get tachyardic   OV 02/01/2018  Chief Complaint  Patient presents with   Follow-up    Pt states he has been doing good since last visit. Started back on OFEV 12/24/17 after taking a holiday off of it.     FU IPF  Took ofev holiday. And then restarted 01/24/18 and doing well.  [place] ofev ok. KBILD ok.   OV 03/16/2018'  . Chief Complaint  Patient presents with   Follow-up    PFT done today. States he has been doing well and denies any current complaints. Tolerating OFEV well and has been walking about 5 miles every day with no complaints.   Ms. Nouri returns for follow-up of idiopathic pulmonary fibrosis.  He is now back on nintedanib after giving it a holiday for a few weeks.  He is tolerating it well and no problems.  He goes for daily walks and has no problems.  His lung function was reviewed today and it shows for the first time a significant decline of greater than 200 cc between 2 time points.  This correlates with him not taking over for a few weeks nevertheless overall he is happy with his health.  He is not interested in research trials anymore.  He has a new question about travel advice encounter.  He plans an [place] from [place], [place] intrajejunal in [place] for 10 days.  This will start on May 22, 2018.  He wants to make sure it is safe for him to go.  He said he has never been on a cruise and he and his wife have a strong desire to go as part of his goals of care.   OV 05/16/2018  Chief Complaint  Patient presents with   Follow-up    Breathing is unchanged since last OV.    Tanner Bishop , 73 y.o. , with dob 1946-06-20 and male ,Not Hispanic or Latino from [address] [place] [address] - presents to pulm ILD clinic for IPF.  He presents with his wife.  And just under 7 days he is going to embark on a [place]  cruise.  This visit is precautionary visit just before that.  Most recent visit was in April 2019.  At this point in time in terms of his IPF he feels stable.  He is compliant with full dose of 5 and is having only mild diarrhea.  Other than that he is tolerating it just fine.  He is looking forward to his cruise.  He had pulmonary function test today that shows an improvement compared to last visit and it  is very similar to summer/fall 2018 in terms of FVC and DLCO.  Although he does feel like he is coming down with an acute bronchitis and wants to take some antibiotic and prednisone I had of the trip.  We also discussed about him having prophylactic antibiotics handy.  He wants a refill on his Tussionex for cough.  At this point in time he is past 5 years of diagnosis and not eligible for research trials he is not interested in transplant although the recent pulmonary fibrosis foundation meeting he did hear from a lot of transplant patients about the individual personal experience.       OV 09/19/2018  Subjective:  Patient ID: Tanner Bishop, male , DOB: 19-[name]-1946 , age 73 y.o. , MRN: [id number] , ADDRESS: [place] [id number]   09/19/2018 -   Chief Complaint  Patient presents with   Follow-up    Pt saw cards 08/23/18. States he is still having problems with his heart going in and out of rhythm and states he has had some episodes to where he almost passes out. States his breathing is at a stable point. States he also has  an occ cough.     HPI Edu Kierstead 73 y.o. -presents 5-year follow-up.  I personally saw him in June 2019.  Then approximately a month ago he presented and saw acutely my nurse practitioner because of dizziness.  It was felt to be A. fib RVR.  He subsequently saw Dr. Crissie Sickles his electrophysiologist.  I reviewed the note.  Flecainide was started.  He says despite that l approximately 10 weeks ago he had another episode of dizziness while getting out of the shower.  He was tachycardic with a heart rate of 140s.  He felt he might pass out but there is no focal neurologic deficits or abnormal sensation.  His pulse ox was normal at 97% at that time.  It happened at rest.  He says he has been walking on the treadmill for several miles and he does not desaturate.  He says his pulse ox is 93% at that time.  He never drops into the 80s.  He does not think his dizziness  and palpitation issues are related to his pulmonary fibrosis.  However [name] is wondering about oxygen drops during this time.  He is not tolerating his nintedanib at full dose without any problems.  He has had a successful [place] trip.  He is participating in the patient support group.  He is up-to-date with his flu shot      OV 10/13/2018  Subjective:  Patient ID: Tanner Bishop, male , DOB: 1946/01/28 , age 53 y.o. , MRN: [id number] , ADDRESS: [place] [id number]   10/13/2018 -   Chief Complaint  Patient presents with   Follow-up    review PFT.  c/o baseline sob with exertion.       HPI Trevionne Foor 73 y.o. -presents for follow-up of IPF to the ILD clinic.  This visit is arranged because letter physiologist [name] was concerned about hypoxemia effects on his atrial fibrillation.  At this point in time patient tells me that after increasing dose of flecainide his atrial fibrillation and palpitations have improved and resolved.  Overall he feels stable.  On September 26, 2018 he had 6-minute walk test and this was pretty robust with walking 384 meters and with lowest pulse ox of 95%.  Then on October 05, 2018 he had overnight pulse oximetry that shows 12 minutes of desaturation less than 88%.  He is not interested in oxygen.  Then he called his October 07, 2018 with worsening bronchitis episodes and we gave him 5 days of prednisone and antibiotics.  He finished his last dose yesterday.  Overall he feels stable but pulmonary function test today shows decline in both FVC and DLCO.  And then when I questioned him he felt like he still had some residual bronchiti and feels another course of prednisone could help him.  There are no other new issues.  Last liver function test October 2019 and was normal.      OV 01/12/2019  Subjective:  Patient ID: Tanner Bishop, male , DOB: 30-Oct-1946 , age 55 y.o. , MRN: [id number] , ADDRESS: [place]  [id number]   01/12/2019 -   Chief Complaint  Patient presents with   Follow-up    PFT performed today. Pt states he is about the same since last visit.States he still becomes SOB with exertion, has a lot of coughing but has been taking mucinex, and also has had some CP or chest tightness.     HPI Yostin Alig 73 y.o. -returns for follow-up of his IPF.  He is now more than 5 years since diagnosis.,  April 2020 he will be 6 years since diagnosis.  He continues to exercise well on the treadmill and according to his wife he is does well.  He is not dropping oxygen at this time.  He does not have a cough when needed works on the tile.  However at other times the day especially when he is trying to lie down to bed or when he gets up in the morning and goes to the bathroom he has really bad cough.  Overall the cough severity is now 6 out of 10 in terms of how it is impacting his quality of life.  He tells me that every time he takes prednisone for the cough he feels better but then when he comes off prednisone cough gets worse.  He said multiple prednisone courses of brief duration for this chronic cough.  In addition for the last few weeks has had yellow sinus drainage and sinus headache and sinus congestion and he feels this is again making his chronic cough worse.  He is open to taking antibiotic and prednisone course.  In terms of taking nintedanib he has no side effects.  His last liver function test reviewed was in October 2019 and it was normal at that time.  Overall at this point in time he really wants good significant support for his cough in terms of affecting his quality of life.    ROS - per HPI     OV 02/16/2019  Subjective:  Patient ID: Tanner Bishop, male , DOB: [birth year] , age 39 y.o. , MRN: [id number] , ADDRESS: [address] [place] [address]   02/16/2019 -   Chief Complaint  Patient presents with   Follow-up    HRCT  and sinus CT performed 2/18. Pt states he is about the same  as last visit and states SOB is about the same. Pt still has a dry cough. Denies any complaints of CP/chest tightness.     HPI Clemente Deak 72 y.o. -returns for follow-up of his IPF.  He presents with his wife.  He is here to review the test results.  He had high-resolution CT chest and CT sinus.  These were done in order to reevaluate his disease because he is having significant cough.  His high-resolution CT chest shows only mild progression in his IPF in 3 years.  However there is significant new findings of tracheobronchomalacia although there is no lung cancer.  He also has two-vessel coronary artery calcification.  These are new findings.  Since last visit and a sinus episode he is stable.  He is currently run out of his nasal steroids.  He says the pharmacy said that I declined his prescription request.  I do not recollect such a conversation.  In any event he is now going to participate in the cough study called scenic.  He has received a copy of the consent form.  He is rated.  He has an appointment for his consent visit.  It is possible that the nasal steroid is on the exclusion list but I do not have the exclusion list with me.  Anyway he is not consented.  He is not having chest pain when he does his treadmill exercises.  He works out hard.  He states he gets tachycardic.  He says he has a 6 times that he will die from his heart condition before his pulmonary fibrosis.  He has upcoming appointment with his cardiologist [name] for atrial fibrillation.  ...................................................................... OV 06/08/2019   Subjective:  Patient ID: Tanner Bishop, male , DOB: 1946/02/10 , age 9 y.o. , MRN: [id number] , ADDRESS: [place] [id number]   06/08/2019 -   Chief Complaint  Patient presents with   Follow-up    1wk f/u for IPF. Patient stated that he was given a round of prednisone last week for some SOB but is feeling much better.       HPI Liban Sinnett 73 y.o. -returns for IPF follow-up.  Last seen in March 2020.  Since the pandemic started he has been social distancing.  He has not attended any support group.  He has been tolerating his Ofev quite well without any difficulty.  Recently had a bronchitis exacerbation and we gave prednisone.  This resolved his cough.  He was going to participate in a cough study but because of the pandemic the study got canceled.  He tells me that every time he takes prednisone the cough goes away.  He is very interested in taking daily prednisone for symptom relief of cough.  We discussed the side effects of prednisone that include weight gain, easy bruising, adrenal insufficiency, osteoporosis, cataracts hypertension, diabetes, immunosuppression.  Despite all this he wants to take a low-dose prednisone daily.  His last liver function test was in March 2020.  This needs to be retested because he is on nintedanib.  His symptom scores at walk test show relative stability.  His last pulmonary function test was in January 2020 but because of the pandemic is on hold.  We are using symptom and walk test to determine progression.    SYMPTOM SCALE - ILD 02/16/2019  06/08/2019   O2 use ra ra  Shortness of Breath 0 ->  5 scale with 5 being worst (score 6 If unable to do)   At rest 1 1  Simple tasks - showers, clothes change, eating, shaving 2 1  Household (dishes, doing bed, laundry) 2 2  Shopping 3 2  Walking level at own pace 2 2  Walking keeping up with others of same age 69 2  Walking up Stairs 4 3  Walking up Hill 4 3  Total (40 - 48) Dyspnea Score 19 16  How bad is your cough? 3 Xx - due to recent pred  How bad is your fatigue 3 x       Results for TAVONTE, SEYBOLD (MRN [id number]) as of 09/16/2017 09:43  Ref. Range 05/14/2014 15:46 07/24/2015 12:43 08/26/2015 10:52 11/02/2016 09:51 05/19/2017 08:41 09/16/2017 08:47 03/16/17 05/16/2018  10/13/2018  01/12/2019   FVC-Pre Latest Units: L 2.71 2.69 2.73  2.61 2.51 2.53 2.29 2.38 2.25 2.21  FVC-%Pred-Pre Latest Units: % 67 67 68 66 63 64 58 60 57% 56%   Results for LOTUS, GOVER (MRN [id number]) as of 09/16/2017 09:43  Ref. Range 05/14/2014 15:46 07/24/2015 12:43 08/26/2015 10:52 11/02/2016 09:51 05/19/2017 08:41 09/16/2017 08:47 03/16/2018  05/16/2018  10/13/2018  01/12/2019   DLCO unc Latest Units: ml/min/mmHg 20.24 15.36 15.36 17.23 15.83  13.14 16.48 14.17 14.38  DLCO unc % pred Latest Units: % 71 54 54 60 55  46 58 50% 50%     Simple office walk 185 feet x  3 laps goal with forehead probe 09/19/2018  01/12/2019  02/16/2019  06/08/2019   O2 used Room air Room air Room air Room air  Number laps completed _0 Comments about pace normal normal normal normal  Resting Pulse Ox/HR 100% and 73/min 100% ad 74/min 100% and 68/min 98% and 82/min  Final Pulse Ox/HR 93% and 85/min 92% and 84/min 93% and 81/min 91% and 96/mni  Desaturated </= 88% no no no no  Desaturated <= 3% points yes Yes, 8 ponts Yes, 7 points Yes, 7 points  Got Tachycardic >/= 90/min no no no yes  Symptoms at end of test x none none none  Miscellaneous comments x x stable stable but tachy for first time        ROS - per HPI     has a past medical history of Allergy, Arthritis, Atrial flutter (HCC), Cataract, GERD (gastroesophageal reflux disease), H/O hiatal hernia, Hyperlipidemia, Injury of right hand, Pulmonary fibrosis ([place]), Renal stone (06/2014), and Ulcer.   reports that he quit smoking about 40 years ago. His smoking use included cigarettes. He has a 16.00 pack-year smoking history. He has never used smokeless tobacco.  Past Surgical History:  Procedure Laterality Date   ATRIAL FLUTTER ABLATION N/A 04/08/2015   Procedure: ATRIAL FLUTTER ABLATION;  Surgeon: Evans Lance, MD;  Location: [facility] CATH LAB;  Service: Cardiovascular;  Laterality: N/A;   CATARACT EXTRACTION, BILATERAL  2016   COLONOSCOPY  03/01/2017   per Dr. Loletha Carrow, clear, repeat in 5 yrs (brother had  colon cancer)    ESOPHAGOGASTRODUODENOSCOPY  2007   [place] LITHOTRIPSY  06-17-14   per Dr. Jeffie Pollock    HAND SURGERY Right    LUNG BIOPSY Left 03/22/2013   Procedure: LUNG BIOPSY;  Surgeon: Melrose Nakayama, MD;  Location: [facility];  Service: Thoracic;  Laterality: Left;   POLYPECTOMY     UPPER GASTROINTESTINAL ENDOSCOPY     VIDEO ASSISTED THORACOSCOPY Left 03/22/2013   Procedure: VIDEO ASSISTED THORACOSCOPY;  Surgeon: Remo Lipps  Chaya [name], MD;  Location: [place];  Service: Thoracic;  Laterality: Left;    No Known Allergies  Immunization History  Administered Date(s) Administered   Influenza Split 09/13/2013   Influenza, High Dose Seasonal PF 10/06/2016, 09/16/2017, 08/17/2018   Influenza-Unspecified 09/07/2014, 09/14/2015   Pneumococcal Conjugate-13 02/16/2017   Pneumococcal Polysaccharide-23 05/26/2013   Td 12/15/2007   Tdap 02/23/2018   Zoster 02/19/2014   Zoster Recombinat (Shingrix) 10/13/2018    Family History  Problem Relation Age of Onset   Liver cancer Mother    Diabetes Sister    Lung cancer Sister    Heart disease Brother    Diabetes Brother    Colon cancer Brother    Diabetes Other    Cancer Other        prostate   Colon polyps Neg Hx    Esophageal cancer Neg Hx    Rectal cancer Neg Hx    Stomach cancer Neg Hx      Current Outpatient Medications:    chlorpheniramine-HYDROcodone (TUSSIONEX PENNKINETIC ER) 10-8 MG/5ML SUER, Take 5 mLs by mouth 2 (two) times daily as needed for cough., Disp: 140 mL, Rfl: 0   diclofenac sodium (VOLTAREN) 1 % GEL, Apply 2 g topically 4 (four) times daily., Disp: 100 g, Rfl: 5   diphenhydrAMINE (BENADRYL) 25 MG tablet, Take 25 mg by mouth every 6 (six) hours as needed for allergies or sleep., Disp: , Rfl:    flecainide (TAMBOCOR) 50 MG tablet, Take 1 tablet (50 mg total) by mouth 3 (three) times daily., Disp: 270 tablet, Rfl: 3   fluticasone (FLONASE) 50 MCG/ACT nasal spray, Place 2  sprays into both nostrils daily., Disp: 48 g, Rfl: 5   gabapentin (NEURONTIN) 100 MG capsule, TAKE 1 CAPSULE BY MOUTH THREE TIMES A DAY, Disp: 60 capsule, Rfl: 5   morphine 10 MG/5ML solution, Take 2 mLs (4 mg total) by mouth 2 (two) times daily as needed., Disp: 120 mL, Rfl: 0   Nintedanib (OFEV) 150 MG CAPS, Take 1 capsule (150 mg total) by mouth 2 (two) times daily., Disp: 60 capsule, Rfl: 12   pantoprazole (PROTONIX) 40 MG tablet, Take 1 tablet (40 mg total) by mouth 2 (two) times daily., Disp: 180 tablet, Rfl: 3   zolpidem (AMBIEN) 5 MG tablet, Take 1 tablet (5 mg total) by mouth at bedtime as needed for sleep., Disp: 30 tablet, Rfl: 5  Current Facility-Administered Medications:    0.9 %  sodium chloride infusion, 500 mL, Intravenous, Continuous, Danis, Estill Cotta III, MD      Objective:   Vitals:   06/08/19 1343  BP: 120/64  Pulse: 92  Temp: 97.9 F (36.6 C)  TempSrc: Oral  SpO2: 98%  Weight: 76.3 kg  Height: _0  (1.727 m)    Estimated body mass index is 25.57 kg/m as calculated from the following:   Height as of this encounter: _1  (1.727 m).   Weight as of this encounter: 76.3 kg.  _2 @  Filed Weights   06/08/19 1343  Weight: 76.3 kg     Physical Exam  General Appearance:    Alert, cooperative, no distress, appears stated age - yesn , Deconditioned looking - no , OBESE  - no, Sitting on Wheelchair -  no  Head:    Normocephalic, without obvious abnormality, atraumatic  Eyes:    PERRL, conjunctiva/corneas clear,  Ears:    Normal TM's and external ear canals, both ears  Nose:   Nares normal, septum midline, mucosa normal,  no drainage    or sinus tenderness. OXYGEN ON  - no . Patient is @ ra   Throat:   Lips, mucosa, and tongue normal; teeth and gums normal. Cyanosis on lips - no  Neck:   Supple, symmetrical, trachea midline, no adenopathy;    thyroid:  no enlargement/tenderness/nodules; no carotid   bruit or JVD  Back:     Symmetric, no  curvature, ROM normal, no CVA tenderness  Lungs:     Distress - no , Wheeze no, Barrell Chest - no, Purse lip breathing - no, Crackles - yes at basen   Chest Wall:    No tenderness or deformity.    Heart:    Regular rate and rhythm, S1 and S2 normal, no rub   or gallop, Murmur - no  Breast Exam:    NOT DONE  Abdomen:     Soft, non-tender, bowel sounds active all four quadrants,    no masses, no organomegaly. Visceral obesity - no  Genitalia:   NOT DONE  Rectal:   NOT DONE  Extremities:   Extremities - normal, Has Cane - no, Clubbing - yes mild, Edema - no  Pulses:   2+ and symmetric all extremities  Skin:   Stigmata of Connective Tissue Disease - no  Lymph nodes:   Cervical, supraclavicular, and axillary nodes normal  Psychiatric:  Neurologic:   Pleasant - yes, Anxious - no, Flat affect - no  CAm-ICU - neg, Alert and Oriented x 3 - yes, Moves all 4s - yes, Speech - normal, Cognition - intact           Assessment:       ICD-10-CM   1. IPF (idiopathic pulmonary fibrosis) ([place])  J84.112   2. 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 Chronic cough  R05        Plan:     Patient Instructions     ICD-10-CM   1. IPF (idiopathic pulmonary fibrosis) ([place]) J84.112   2. Chronic cough R05   3. 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 MBrand Males M.D., F.C.C.P,  Pulmonary and Critical Care Medicine Staff Physician, CSaguacheDirector - Interstitial  Lung Disease  Program  Pulmonary [place] [id number] Pager: [phone] If no answer or between  15:00h - 7:00h: call [phone] Telephone: [phone]  3:00 PM 06/08/2019

## 2019-06-08 NOTE — Addendum Note (Signed)
Addended by: Valerie Salts on: 06/08/2019 03:04 PM   Modules accepted: Orders

## 2019-06-08 NOTE — Addendum Note (Signed)
Addended by: Suzzanne Cloud E on: 06/08/2019 03:06 PM   Modules accepted: Orders

## 2019-06-09 MED ORDER — PREDNISONE 5 MG PO TABS: 5.0000 mg | ORAL_TABLET | Freq: Every day | ORAL | 2 refills | Status: DC

## 2019-06-09 NOTE — Addendum Note (Signed)
Addended by: Valerie Salts on: 06/09/2019 09:49 AM   Modules accepted: Orders

## 2019-07-19 ENCOUNTER — Other Ambulatory Visit: Payer: Self-pay

## 2019-07-19 ENCOUNTER — Encounter: Payer: Self-pay | Admitting: Internal Medicine

## 2019-07-19 ENCOUNTER — Ambulatory Visit: Payer: PPO | Admitting: Internal Medicine

## 2019-07-19 VITALS — BP 118/70 | HR 58 | Ht 68.0 in | Wt 166.8 lb

## 2019-07-19 DIAGNOSIS — I4892 Unspecified atrial flutter: Secondary | ICD-10-CM | POA: Diagnosis not present

## 2019-07-19 DIAGNOSIS — R002 Palpitations: Secondary | ICD-10-CM | POA: Insufficient documentation

## 2019-07-19 DIAGNOSIS — J84112 Idiopathic pulmonary fibrosis: Secondary | ICD-10-CM

## 2019-07-19 NOTE — Patient Instructions (Signed)

## 2019-07-19 NOTE — Progress Notes (Signed)
HPI [name] returns today for followup of his atrial fib. He is a pleasant 73 yo man with a h/o pulmonary fibrosis, PAF who has been well controlled on flecainide. He denies chest pain or sob. No syncope. No palpitations. He uses oxygen at night for his pulmonary fibrosis. He denies edema. No Known Allergies   Current Outpatient Medications  Medication Sig Dispense Refill  . chlorpheniramine-HYDROcodone (TUSSIONEX PENNKINETIC ER) 10-8 MG/5ML SUER Take 5 mLs by mouth 2 (two) times daily as needed for cough. 140 mL 0  . diclofenac sodium (VOLTAREN) 1 % GEL Apply 2 g topically 4 (four) times daily. 100 g 5  . diphenhydrAMINE (BENADRYL) 25 MG tablet Take 25 mg by mouth every 6 (six) hours as needed for allergies or sleep.    . flecainide (TAMBOCOR) 50 MG tablet Take 1 tablet (50 mg total) by mouth 3 (three) times daily. 270 tablet 3  . fluticasone (FLONASE) 50 MCG/ACT nasal spray Place 2 sprays into both nostrils daily. 48 g 5  . gabapentin (NEURONTIN) 100 MG capsule TAKE 1 CAPSULE BY MOUTH THREE TIMES A DAY 60 capsule 5  . morphine 10 MG/5ML solution Take 2 mLs (4 mg total) by mouth 2 (two) times daily as needed. 120 mL 0  . Nintedanib (OFEV) 150 MG CAPS Take 1 capsule (150 mg total) by mouth 2 (two) times daily. 60 capsule 12  . pantoprazole (PROTONIX) 40 MG tablet Take 1 tablet (40 mg total) by mouth 2 (two) times daily. 180 tablet 3  . predniSONE (DELTASONE) 5 MG tablet Take 1 tablet (5 mg total) by mouth daily with breakfast. 30 tablet 2  . zolpidem (AMBIEN) 5 MG tablet Take 1 tablet (5 mg total) by mouth at bedtime as needed for sleep. 30 tablet 5   Current Facility-Administered Medications  Medication Dose Route Frequency Provider Last Rate Last Dose  . 0.9 %  sodium chloride infusion  500 mL Intravenous Continuous Doran Stabler, MD         Past Medical History:  Diagnosis Date  . Allergy   . Arthritis   . Atrial flutter ([place])    a. s/p ablation 03/2015 Dr Lovena Le   . Cataract    removed both eyes  . GERD (gastroesophageal reflux disease)   . H/O hiatal hernia   . Hyperlipidemia   . Injury of right hand    permanent damage after workplace 2009 and 2010 Dr Vernona Rieger [facility]  . Pulmonary fibrosis ([place])    sees Dr. Onnie Graham   . Renal stone 06/2014  . Ulcer    unresolved    ROS:   All systems reviewed and negative except as noted in the HPI.   Past Surgical History:  Procedure Laterality Date  . ATRIAL FLUTTER ABLATION N/A 04/08/2015   Procedure: ATRIAL FLUTTER ABLATION;  Surgeon: Evans Lance, MD;  Location: [facility] CATH LAB;  Service: Cardiovascular;  Laterality: N/A;  . CATARACT EXTRACTION, BILATERAL  2016  . COLONOSCOPY  03/01/2017   per Dr. Loletha Carrow, clear, repeat in 5 yrs (brother had colon cancer)   . ESOPHAGOGASTRODUODENOSCOPY  2007  . EXTRACORPOREAL SHOCK WAVE LITHOTRIPSY  06-17-14   per Dr. Jeffie Pollock   . HAND SURGERY Right   . LUNG BIOPSY Left 03/22/2013   Procedure: LUNG BIOPSY;  Surgeon: Melrose Nakayama, MD;  Location: [facility];  Service: Thoracic;  Laterality: Left;  . POLYPECTOMY    . UPPER GASTROINTESTINAL ENDOSCOPY    . VIDEO  ASSISTED THORACOSCOPY Left 03/22/2013   Procedure: VIDEO ASSISTED THORACOSCOPY;  Surgeon: Melrose Nakayama, MD;  [facility];  Service: Thoracic;  Laterality: Left;     Family History  Problem Relation Age of Onset  . Liver cancer Mother   . Diabetes Sister   . Lung cancer Sister   . Heart disease Brother   . Diabetes Brother   . Colon cancer Brother   . Diabetes Other   . Cancer Other        prostate  . Colon polyps Neg Hx   . Esophageal cancer Neg Hx   . Rectal cancer Neg Hx   . Stomach cancer Neg Hx      Social History   Socioeconomic History  . Marital status: Married    Spouse name: Not on file  . Number of children: Not on file  . Years of education: Not on file  . Highest education level: Not on file  Occupational History  . 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 Occupation: Disabled   Social Needs  . Financial resource strain: Not on file  . Food insecurity    Worry: Not on file    Inability: Not on file  . Transportation needs    Medical: Not on file    Non-medical: Not on file  Tobacco Use  . Smoking status: Former Smoker    Packs/day: 1.00    Years: 16.00    Pack years: 16.00    Types: Cigarettes    Quit date: 12/14/1978    Years since quitting: 40.6  . Smokeless tobacco: Never Used  Substance and Sexual Activity  . Alcohol use: No    Alcohol/week: 0.0 standard drinks  . Drug use: No  . Sexual activity: Not on file  Lifestyle  . Physical activity    Days per week: Not on file    Minutes per session: Not on file  . Stress: Not on file  Relationships  . Social Herbalist on phone: Not on file    Gets together: Not on file    Attends religious service: Not on file    Active member of club or organization: Not on file    Attends meetings of clubs or organizations: Not on file    Relationship status: Not on file  . Intimate partner violence    Fear of current or ex partner: Not on file    Emotionally abused: Not on file    Physically abused: Not on file    Forced sexual activity: Not on file  Other Topics Concern  . Not on file  Social History Narrative  . Not on file     BP 118/70   Pulse (!) 58   Ht _0  (1.727 m)   Wt 166 lb 12.8 oz (75.7 kg)   SpO2 96%   BMI 25.36 kg/m   Physical Exam:  Well appearing NAD HEENT: Unremarkable Neck:  No JVD, no thyromegally Lymphatics:  No adenopathy Back:  No CVA tenderness Lungs:  Scattered rales bilaterally, especially in the bases HEART:  Regular rate rhythm, no murmurs, no rubs, no clicks Abd:  soft, positive bowel sounds, no organomegally, no rebound, no guarding Ext:  2 plus pulses, no edema, no cyanosis, no clubbing Skin:  No rashes no nodules Neuro:  CN II through XII intact, motor grossly intact  EKG - nsr with rbbb  Assess/Plan:  1. PAF - he appears to be maintaining NSR  nicely on 150 mg daily of flecainide. He will continue. 2. Pulmonary fibrosis - he is well with regard to this and will continue the Ofev and prednisone.  [name].D.

## 2019-08-27 ENCOUNTER — Other Ambulatory Visit: Payer: Self-pay | Admitting: Internal Medicine

## 2019-09-09 ENCOUNTER — Other Ambulatory Visit: Payer: Self-pay | Admitting: Internal Medicine

## 2019-10-21 ENCOUNTER — Other Ambulatory Visit: Payer: Self-pay | Admitting: Family Medicine

## 2019-10-23 NOTE — Telephone Encounter (Signed)
Okay for refill?  

## 2019-11-23 ENCOUNTER — Telehealth: Payer: Self-pay | Admitting: Internal Medicine

## 2019-11-23 MED ORDER — OFEV 150 MG PO CAPS: 150.0000 mg | ORAL_CAPSULE | Freq: Two times a day (BID) | ORAL | 11 refills | Status: DC

## 2019-11-23 NOTE — Telephone Encounter (Signed)
Called [facility] and spoke with pharmacist providing a verbal refill of pt's OFEV 162m Rx. Nothing further needed.

## 2019-11-30 ENCOUNTER — Other Ambulatory Visit: Payer: Self-pay | Admitting: Internal Medicine

## 2020-01-22 ENCOUNTER — Telehealth: Payer: Self-pay | Admitting: Internal Medicine

## 2020-01-22 NOTE — Telephone Encounter (Signed)
Unable to submit PA through [employer]. Called [employer], rep advised the patient has 1 more remaining transitional fill for Ofev. She will fax PA forms to the office to be completed and faxed in. Please place forms in pharmacy folder once received.  Thanks!  Phone# [phone]  2:40 PM Beatriz Chancellor, CPhT

## 2020-01-22 NOTE — Telephone Encounter (Signed)
Got call from support group that patient Tanner Bishop is going to run out of Ofev and copay an issue. Please address. If running out then I can do some donation samples

## 2020-01-22 NOTE — Telephone Encounter (Signed)
Our pharmacy staff is working on this. Based on the info from [place], pharmacy tech, pt has one transitional fill for his OFEV remaining. Will be on the lookout for the forms to come from [employer] for the PA to be able to give to the pharmacy team.

## 2020-01-22 NOTE — Telephone Encounter (Signed)
Spoke with pt. He states that his insurance is denying coverage for OFEV. Pt is having a hard time being able to afford this medication. Advised him that I would get his message to our pharmacy team to help him with this.

## 2020-01-24 NOTE — Telephone Encounter (Signed)
Submitted a Prior Authorization request to [facility] for OFEV 153m via Phone. Will update once we receive a response.  Case# [id number]# [phone] 2:04 PM RBeatriz Chancellor CPhT

## 2020-01-24 NOTE — Telephone Encounter (Signed)
This was not in our pharmacy box. Will call later today to request new fax, if needed.

## 2020-01-24 NOTE — Telephone Encounter (Signed)
Have these forms been received? Thanks.

## 2020-01-24 NOTE — Telephone Encounter (Signed)
Checked MR's papers in pod and there was nothing in there on pt.

## 2020-01-25 NOTE — Telephone Encounter (Signed)
Called [employer] to check status of Ofev prior auth.   Received notification from [facility] regarding a prior authorization for OFEV169m. Authorization has been APPROVED from 01/25/20 to 01/24/21.   Authorization # [id number] # [phone] Called patient to advise. Patient had a question about what would happen after he used up his grant funds. I advised him he would be able to enroll into a different grant when that happens. He will reach out if the pharmacy does not initiate. And he will call office if he has any issues filling with the pharmacy.  1:50 PM RBeatriz Chancellor CPhT

## 2020-01-28 ENCOUNTER — Telehealth: Payer: Self-pay | Admitting: Internal Medicine

## 2020-01-28 DIAGNOSIS — J84112 Idiopathic pulmonary fibrosis: Secondary | ICD-10-CM

## 2020-01-28 NOTE — Telephone Encounter (Signed)
Last seen in June 2020.  Last pulmonary function test was January 2020  Plan -Get spirometry and DLCO in April 2021 if possible -Please bring him in for a 15-minute visit on any day including ILD day  -

## 2020-01-29 NOTE — Telephone Encounter (Signed)
Spoke with the pt and scheduled appts

## 2020-01-29 NOTE — Telephone Encounter (Signed)
ATC pt, the call would not connect when try to call. Will try back.

## 2020-02-01 ENCOUNTER — Telehealth: Payer: Self-pay | Admitting: Family Medicine

## 2020-02-01 NOTE — Chronic Care Management (AMB) (Signed)
  Chronic Care Management   Note  02/01/2020 Name: Dawson Albers MRN: [id number] DOB: May 26, 1946  Millard Bautch is a 74 y.o. year old male who is a primary care patient of [name], MD. I reached out to [name] by phone today in response to a referral sent by Mr. Draxton Mcalpine's PCP, [name], MD.   Mr. Quezada was given information about Chronic Care Management services today including:  1. CCM service includes personalized support from designated clinical staff supervised by his physician, including individualized plan of care and coordination with other care providers 2. 24/7 contact phone numbers for assistance for urgent and routine care needs. 3. Service will only be billed when office clinical staff spend 20 minutes or more in a month to coordinate care. 4. Only one practitioner may furnish and bill the service in a calendar month. 5. The patient may stop CCM services at any time (effective at the end of the month) by phone call to the office staff. 6. The patient will be responsible for cost sharing (co-pay) of up to 20% of the service fee (after annual deductible is met).  Patient agreed to services and verbal consent obtained.   Follow up plan:   Raynicia Dukes [employer] Scheduler

## 2020-02-05 ENCOUNTER — Telehealth: Payer: Self-pay

## 2020-02-05 DIAGNOSIS — E782 Mixed hyperlipidemia: Secondary | ICD-10-CM

## 2020-02-05 DIAGNOSIS — J84112 Idiopathic pulmonary fibrosis: Secondary | ICD-10-CM

## 2020-02-05 NOTE — Telephone Encounter (Signed)
I am requesting an ambulatory referral to CCM be placed for this patient. Referral will need to have 2 current diagnosis attached to it. Thank you!  

## 2020-02-06 ENCOUNTER — Ambulatory Visit (INDEPENDENT_AMBULATORY_CARE_PROVIDER_SITE_OTHER): Payer: PPO | Admitting: Primary Care

## 2020-02-06 ENCOUNTER — Other Ambulatory Visit: Payer: Self-pay

## 2020-02-06 DIAGNOSIS — J84112 Idiopathic pulmonary fibrosis: Secondary | ICD-10-CM | POA: Diagnosis not present

## 2020-02-06 DIAGNOSIS — J0100 Acute maxillary sinusitis, unspecified: Secondary | ICD-10-CM

## 2020-02-06 MED ORDER — DOXYCYCLINE HYCLATE 100 MG PO TABS: 100.0000 mg | ORAL_TABLET | Freq: Two times a day (BID) | ORAL | 0 refills | Status: DC

## 2020-02-06 MED ORDER — HYDROCOD POLST-CPM POLST ER 10-8 MG/5ML PO SUER: 5.0000 mL | Freq: Two times a day (BID) | ORAL | 0 refills | Status: DC | PRN

## 2020-02-06 MED ORDER — PREDNISONE 10 MG PO TABS: ORAL_TABLET | ORAL | 0 refills | Status: DC

## 2020-02-06 NOTE — Chronic Care Management (AMB) (Signed)
Chronic Care Management Pharmacy  Name: Tanner Bishop      MRN: [id number]     DOB: 1946-09-01  Initial Questions: 1. Have you seen any other providers since your last visit? NA 2. Any changes in your medicines or health? Yes - recently started antibiotic and prednisone taper for sinusitis.   Chief Complaint/ HPI Tanner Bishop,  74 y.o. , male presents for their Initial CCM visit with the clinical pharmacist via telephone due to COVID-19 Pandemic.  PCP : [name], MD  Their chronic conditions include: GERD, Hyperlipidemia, Atrial flutter, Idiopathic pulmonary fibrosis, Insomnia  , Sinusitis, Vitamin d deficiency, Osteoarthritis   Office Visits: 02/28/2019- Patient presented in office to Dr. Sarajane Jews for annual exam. Patient to continue medications. Diet/ exercise was discussed. Patient to obtain fasting labs.   Consult Visit: 02/06/2020- Pulmonology- Patient presented to Geraldo Pitter, NP for maxillary sinusitis and IPF follow up. Patient given doxycycline 1 tablet twice daily for 7 days and to continue Flonase. Patient to continue Ofev 1666m twice daily and prednisone taper and to resume prednisone 518mdaily.   07/19/2019- Cardiology- Patient presented to Dr. GrCristopher PeruMD for afib follow up. Patient maintained on NSR and to continue on flecainide 150 mg daily.   06/08/2019- Pulmonology- Patient presented to Dr. MuBrand MalesMD for IPF follow up. Patient clinically stable. Patient to continue on Ofev and recheck LFTs. Patient to continue on prednisone 66m47maily (only thing that helps with his cough). Long term side effects were discussed with patient. Follow up in 3-4 months.   Medications: Outpatient Encounter Medications as of 02/07/2020  Medication Sig  . chlorpheniramine-HYDROcodone (TUSSIONEX PENNKINETIC ER) 10-8 MG/5ML SUER Take 5 mLs by mouth 2 (two) times daily as needed for cough (Do not take with other sedating medication).  . diphenhydrAMINE (BENADRYL) 25 MG tablet  Take 25 mg by mouth every 6 (six) hours as needed for allergies or sleep. Patient reports taking at bedtime  . doxycycline (VIBRA-TABS) 100 MG tablet Take 1 tablet (100 mg total) by mouth 2 (two) times daily.  . flecainide (TAMBOCOR) 50 MG tablet TAKE 1 TABLET BY MOUTH THREE TIMES A DAY  . fluticasone (FLONASE) 50 MCG/ACT nasal spray Place 2 sprays into both nostrils daily.  . Nintedanib (OFEV) 150 MG CAPS Take 1 capsule (150 mg total) by mouth 2 (two) times daily.  . pantoprazole (PROTONIX) 40 MG tablet Take 1 tablet (40 mg total) by mouth 2 (two) times daily.  . predniSONE (DELTASONE) 10 MG tablet Take 4 tabs po daily x 3 days; then 3 tabs daily x3 days; then 2 tabs daily x3 days; then 1 tab daily x 3 days; then stop  . zolpidem (AMBIEN) 5 MG tablet TAKE 1 TABLET BY MOUTH AT BEDTIME AS NEEDED FOR SLEEP.  . dMarland Kitchenclofenac sodium (VOLTAREN) 1 % GEL Apply 2 g topically 4 (four) times daily. (Patient not taking: Reported on 02/07/2020)  . FLUAD QUADRIVALENT 0.5 ML injection   . predniSONE (DELTASONE) 5 MG tablet TAKE 1 TABLET BY MOUTH EVERY DAY WITH BREAKFAST (Patient not taking: Reported on 02/07/2020)   Facility-Administered Encounter Medications as of 02/07/2020  Medication  . 0.9 %  sodium chloride infusion    Current Diagnosis/Assessment:  Goals Addressed            This Visit's Progress   . Pharmacy Care Plan       Current Barriers:  . Chronic Disease Management support, education, and care coordination needs related to HLD, GERD, Atrial  flutter, Idiopathic pulmonary fibrosis, Insomnia, Sinusitis, Vitamin D deficiency, Osteoarthritis   Pharmacist Clinical Goal(s):  Marland Kitchen Cholesterol goals: Total Cholesterol goal under 200, Triglycerides goal under 150, HDL goal above 40 (men) or above 50 (women), LDL goal under 100.  . Maintain Vitamin D-25 level at or above 30 . Continue working on lifestyle modifications (diet/exercise). . Minimize reflux symptoms   Interventions: . Comprehensive  medication review performed. . Assessed patient's education and care coordination needs . Provided disease specific education to patient. . Discussed how to reduce cholesterol through diet/weight management and physical activity.       A. How a diet high in plant sterols (fruits/vegetables/nuts/whole grains/legumes) may reduce your cholesterol.         B. Encouraged increasing fiber to a daily intake of 10-25g/day  . Discussed non-pharmacological interventions for acid reflux. Take measures to prevent acid reflux, such as avoiding spicy foods, avoiding caffeine, avoid laying down a few hours after eating, and raising the head of the bed. . Discussed non-pharmacological interventions for insomnia. Avoid napping, limit exposure to technology near bedtime, etc).  Patient Self Care Activities:  . Calls provider office for new concerns or questions . Continue current medications as directed by providers.  . Continue following up with specialists. . Continue with lifestyle modifications (diet and exercise).   Initial goal documentation         A flutter  Patient is currently rhythm controlled.  Patient has failed these meds in past: none  Patient underwent ablation previously.   Patient is currently controlled on the following medications:  - flecainide 31m, 1 tablet three times daily    Anticoagulation:  Patient mentions being on Xarelto in the past, It was intended for short period of time while ablation was pending. (concerns with increased bleeding with Ofev). Appropriate to continue as is.   Plan Managed by cardiology.  Continue current medications.   Hyperlipidemia   Lipid Panel     Component Value Date/Time   CHOL 229 (H) 02/28/2019 0834   TRIG 88.0 02/28/2019 0834   HDL 62.70 02/28/2019 0834   CHOLHDL 4 02/28/2019 0834   VLDL 17.6 02/28/2019 0834   LDLCALC 148 (H) 02/28/2019 0834   LDLDIRECT 137.9 12/17/2010 1005    ASCVD 10-year risk: 17.6%   Patient has failed  these meds in past: none   Patient is currently uncontrolled on:  Lifestyle modifications (patient states he walks 5 miles a day on treadmill and does light weights in addition to avoiding fry, fatty foods)  We discussed:  diet and exercise extensively.   - How to reduce cholesterol through diet/weight management and physical activity. - How a diet high in plant sterols (fruits/vegetables/nuts/whole grains/legumes) may reduce your cholesterol.   - Encouraged increasing fiber to a daily intake of 10-25g/day   Plan Patient states he has been focused on lifestyle modifications as he wants to avoid adding statin medications. Next lab visit scheduled on 02/29/2020.  Continue control with diet and exercise.   GERD/ acid reflux   Patient has failed these meds in past: none   Patient is currently controlled on the following medications:  - pantoprazole 428m 1 tablet twice daily   We discussed:  non-pharmacological interventions for acid reflux. Take measures to prevent acid reflux, such as avoiding spicy foods, avoiding caffeine, avoid laying down a few hours after eating, and raising the head of the bed.   Plan Patient states currently acid reflux is controlled and has discussed in the past with  Dr. Sarajane Jews about long-term use of PPIs.  Continue current medications.    Idiopathic pulmonary fibrosis  Patient has failed these meds in past: Esbriet (side effects), morphine for cough (severe drowsiness)   Patient is currently controlled on the following medications:  - Ofev 152m, 1 capsule twice daily  - Tussionex, 57mtwice daily as needed for cough  - Prednisone 2m80m1 tablet once daily (currently not taking as he is on prednisone taper).   Plan Managed by pulmonology (Dr. RamChase Caller Patient states he has a chronic cough and low dose prednisone is the only thing that has helped him. Patient aware of long term risks.  Continue current medications.   Sinusitis   Patient is currently  managed on the following medications: - doxycycline 100 mg, 1 tablet twice daily x 7 days (started yesterday)  - Prednisone taper (once completed, he will resume prednisone 2mg56mily)  - Fluticasone 50mc68mt nasal spray-  2 sprays in each nostril twice daily   - diphenhydramine 22mg,77mry 6 hours as needed for allergies or sleep  We discussed:  increased risk anticholinergic effects with diphenhydramine in older adults.   - Patient mentions Dr. Ramas Waynetta Peand no concern with Benadryl and patient only takes at nighttime.   Plan Managed by pulmonology.  Continue current medications.   Vitamin D deficiency   Patient has failed these meds in past: none   Patient is currently uncontrolled on the following medications:  No medications    - Patient mentions only taking vitamin D for a couple of months, and believes he was told to stop it after a couple of months.   Vitamin D: 14.78 (02/28/2019).   Plan Continue as is, for now.  Recommend vitamin D level at next lab visit (02/29/2020) for reassessment.     Osteoarthritis   Patient has failed these meds in past: none   Patient is currently controlled on the following medications:  - diclofenac 1% gel as needed.  (Has not needed to use diclofenac gel since being on prednisone)  Plan Continue as is.   Insomnia  Patient has failed these meds in past: none   Patient is currently controlled on the following medications:   - zolpidem 2mg, 170mblet at bedtime as needed for sleep (patient takes once a week or once every 2 weeks)   We discussed:  non-pharmacological interventions for insomnia. Avoid napping, limit exposure to technology near bedtime, etc).  Plan Recommended non-pharmacological interventions for insomnia and trial of melatonin.  Continue current medications.   Medication Management   Patient organizes medications: does not use pil box.  Denies issues obtaining medications  - except Ofev since it is specialty  medication (working with insurance).   Follow up Patient has follow up appointment with Dr. Fry on Sarajane Jews8/2021. In addition to lab work planned, recommend obtaining lipid panel and vitamin D level at [facility].   FLatimerlow up appointment in 1 year. Will follow up with patient with general assessments telephone calls up until next appointment and will reassess.    AnnetteAnson CroftsD Clinical Pharmacist [phone]

## 2020-02-06 NOTE — Telephone Encounter (Signed)
Done

## 2020-02-06 NOTE — Progress Notes (Signed)
Virtual Visit via Telephone Note  I connected with [name] on 02/06/20 at  2:30 PM EST by telephone and verified that I am speaking with the correct person using two identifiers.  Location: Patient: Home Provider: Office   I discussed the limitations, risks, security and privacy concerns of performing an evaluation and management service by telephone and the availability of in person appointments. I also discussed with the patient that there may be a patient responsible charge related to this service. The patient expressed understanding and agreed to proceed.   History of Present Illness: 74 year old male, former smoker quit in [place] (16 pack year hx). PMH significant for IPF, aflutter, GERD, dysphagia, chronic cough, HLD. Patient of Dr. Brantley Persons, last seen in June 2020. HRCT showed only mild progression of his IPF in three years. Maintained on ofev 150 twice daily and prednisone 76m daily.   02/06/2020 Patient contacted today for acute visit. Reports nasal congestion/pressure, sinus drainage and cough with yellow mucus x 3 days. Symptoms worse yesterday and he had difficulty sleeping d.t cough. He has Tussionex which he takes as needed which helps but needs refill. He does not use often. He is compliant with Flonase nasal spray. He also has been using warm wash cloth to sinus for pain relief. No known sick contact. States that he does not leave the house frequently. Denies fever, acute shortness of breath.    Observations/Objective:  - Audible nasal congestion  - No significant shortness of breath, wheezing  Assessment and Plan:  Acute maxillary sinusitis: - Nasal congestion and sinus drainage with yellow mucus  - RX doxycycline 1 tab twice daly x 7 days  - Continue flonase nasal spray as prescribed   ILD exacerbation: - Increased cough d/t PND  - Continue Ofev 1577mtwice daily  - Prednisone taper as prescribed; then resume 16m50maily - Refill Tussionex 16ml216mice daily for cough  (do not combine with other sedating medication)  Follow Up Instructions:   - Return/call if symptoms do not improve or worsen   I discussed the assessment and treatment plan with the patient. The patient was provided an opportunity to ask questions and all were answered. The patient agreed with the plan and demonstrated an understanding of the instructions.   The patient was advised to call back or seek an in-person evaluation if the symptoms worsen or if the condition fails to improve as anticipated.  I provided 18 minutes of non-face-to-face time during this encounter.   Tanner Bishop

## 2020-02-06 NOTE — Patient Instructions (Signed)
  Acute maxillary sinusitis: - RX doxycycline 1 tab twice daly x 7 days  - Continue flonase nasal spray as prescribed   ILD exacerbation: - Continue Ofev 134m twice daily  - Prednisone taper as prescribed; then resume 578mdaily - Refill Tussionex 70m81mwice daily for cough (do not combine with other sedating medication)   Follow Up Instructions:   - Return/call if symptoms do not improve or worsen (ED if shortness of breath)

## 2020-02-07 ENCOUNTER — Ambulatory Visit: Payer: PPO

## 2020-02-07 ENCOUNTER — Other Ambulatory Visit: Payer: Self-pay

## 2020-02-07 DIAGNOSIS — E782 Mixed hyperlipidemia: Secondary | ICD-10-CM

## 2020-02-07 DIAGNOSIS — J84112 Idiopathic pulmonary fibrosis: Secondary | ICD-10-CM

## 2020-02-08 NOTE — Patient Instructions (Addendum)
Visit Information  Goals Addressed            This Visit's Progress   . Pharmacy Care Plan       Current Barriers:  . Chronic Disease Management support, education, and care coordination needs related to HLD, GERD, Atrial flutter, Idiopathic pulmonary fibrosis, Insomnia, Sinusitis, Vitamin D deficiency, Osteoarthritis   Pharmacist Clinical Goal(s):  Marland Kitchen Cholesterol goals: Total Cholesterol goal under 200, Triglycerides goal under 150, HDL goal above 40 (men) or above 50 (women), LDL goal under 100.  . Maintain Vitamin D-25 level at or above 30 . Continue working on lifestyle modifications (diet/exercise). . Minimize reflux symptoms   Interventions: . Comprehensive medication review performed. . Assessed patient's education and care coordination needs . Provided disease specific education to patient. . Discussed how to reduce cholesterol through diet/weight management and physical activity.       A. How a diet high in plant sterols (fruits/vegetables/nuts/whole grains/legumes) may reduce your cholesterol.         B. Encouraged increasing fiber to a daily intake of 10-25g/day  . Discussed non-pharmacological interventions for acid reflux. Take measures to prevent acid reflux, such as avoiding spicy foods, avoiding caffeine, avoid laying down a few hours after eating, and raising the head of the bed. . Discussed non-pharmacological interventions for insomnia. Avoid napping, limit exposure to technology near bedtime, etc).  Patient Self Care Activities:  . Calls provider office for new concerns or questions . Continue current medications as directed by providers.  . Continue following up with specialists. . Continue with lifestyle modifications (diet and exercise).   Initial goal documentation        Tanner Bishop was given information about Chronic Care Management services today including:  1. CCM service includes personalized support from designated clinical staff supervised by  his physician, including individualized plan of care and coordination with other care providers 2. 24/7 contact phone numbers for assistance for urgent and routine care needs. 3. Service will only be billed when office clinical staff spend 20 minutes or more in a month to coordinate care. 4. Only one practitioner may furnish and bill the service in a calendar month. 5. The patient may stop CCM services at any time (effective at the end of the month) by phone call to the office staff. 6. The patient will be responsible for cost sharing (co-pay) of up to 20% of the service fee (after annual deductible is met).  Patient agreed to services and verbal consent obtained.   The patient verbalized understanding of instructions provided today and agreed to receive a mailed copy of patient instruction and/or educational materials. Telephone follow up appointment with pharmacy team member scheduled for:  02/07/2021  Anson Crofts, PharmD Clinical Pharmacist [facility] [place] [phone]   [name] stands for "Dietary Approaches to Stop Hypertension." The DASH eating plan is a healthy eating plan that has been shown to reduce high blood pressure (hypertension). It may also reduce your risk for type 2 diabetes, heart disease, and stroke. The DASH eating plan may also help with weight loss. What are tips for following this plan?  General guidelines  Avoid eating more than 2,300 mg (milligrams) of salt (sodium) a day. If you have hypertension, you may need to reduce your sodium intake to 1,500 mg a day.  Limit alcohol intake to no more than 1 drink a day for nonpregnant women and 2 drinks a day for men. One drink equals 12 oz  of beer, 5 oz of wine, or 1 oz of hard liquor.  Work with your health care provider to maintain a healthy body weight or to lose weight. Ask what an ideal weight is for you.  Get at least 30 minutes of exercise that causes your heart to beat  faster (aerobic exercise) most days of the week. Activities may include walking, swimming, or biking.  Work with your health care provider or diet and nutrition specialist (dietitian) to adjust your eating plan to your individual calorie needs. Reading food labels   Check food labels for the amount of sodium per serving. Choose foods with less than 5 percent of the Daily Value of sodium. Generally, foods with less than 300 mg of sodium per serving fit into this eating plan.  To find whole grains, look for the word "whole" as the first word in the ingredient list. Shopping  Buy products labeled as "low-sodium" or "no salt added."  Buy fresh foods. Avoid canned foods and premade or frozen meals. Cooking  Avoid adding salt when cooking. Use salt-free seasonings or herbs instead of table salt or sea salt. Check with your health care provider or pharmacist before using salt substitutes.  Do not fry foods. Cook foods using healthy methods such as baking, boiling, grilling, and broiling instead.  Cook with heart-healthy oils, such as olive, canola, soybean, or sunflower oil. Meal planning  Eat a balanced diet that includes: ? 5 or more servings of fruits and vegetables each day. At each meal, try to fill half of your plate with fruits and vegetables. ? Up to 6-8 servings of whole grains each day. ? Less than 6 oz of lean meat, poultry, or fish each day. A 3-oz serving of meat is about the same size as a deck of cards. One egg equals 1 oz. ? 2 servings of low-fat dairy each day. ? A serving of nuts, seeds, or beans 5 times each week. ? Heart-healthy fats. Healthy fats called Omega-3 fatty acids are found in foods such as flaxseeds and coldwater fish, like sardines, salmon, and mackerel.  Limit how much you eat of the following: ? Canned or prepackaged foods. ? Food that is high in trans fat, such as fried foods. ? Food that is high in saturated fat, such as fatty meat. ? Sweets, desserts,  sugary drinks, and other foods with added sugar. ? Full-fat dairy products.  Do not salt foods before eating.  Try to eat at least 2 vegetarian meals each week.  Eat more home-cooked food and less restaurant, buffet, and fast food.  When eating at a restaurant, ask that your food be prepared with less salt or no salt, if possible. What foods are recommended? The items listed may not be a complete list. Talk with your dietitian about what dietary choices are best for you. Grains Whole-grain or whole-wheat bread. Whole-grain or whole-wheat pasta. Brown rice. [place]. Bulgur. Whole-grain and low-sodium cereals. Pita bread. Low-fat, low-sodium crackers. Whole-wheat flour tortillas. Vegetables Fresh or frozen vegetables (raw, steamed, roasted, or grilled). Low-sodium or reduced-sodium tomato and vegetable juice. Low-sodium or reduced-sodium tomato sauce and tomato paste. Low-sodium or reduced-sodium canned vegetables. Fruits All fresh, dried, or frozen fruit. Canned fruit in natural juice (without added sugar). Meat and other protein foods Skinless chicken or [place]. Ground chicken or [place]. Pork with fat trimmed off. Fish and seafood. Egg whites. Dried beans, peas, or lentils. Unsalted nuts, nut butters, and seeds. Unsalted canned beans. Lean cuts of beef with  fat trimmed off. Low-sodium, lean deli meat. Dairy Low-fat (1%) or fat-free (skim) milk. Fat-free, low-fat, or reduced-fat cheeses. Nonfat, low-sodium ricotta or cottage cheese. Low-fat or nonfat yogurt. Low-fat, low-sodium cheese. Fats and oils Soft margarine without trans fats. Vegetable oil. Low-fat, reduced-fat, or light mayonnaise and salad dressings (reduced-sodium). Canola, safflower, olive, soybean, and sunflower oils. Avocado. Seasoning and other foods Herbs. Spices. Seasoning mixes without salt. Unsalted popcorn and pretzels. Fat-free sweets. What foods are not recommended? The items listed may not be a complete list.  Talk with your dietitian about what dietary choices are best for you. Grains Baked goods made with fat, such as croissants, muffins, or some breads. Dry pasta or rice meal packs. Vegetables Creamed or fried vegetables. Vegetables in a cheese sauce. Regular canned vegetables (not low-sodium or reduced-sodium). Regular canned tomato sauce and paste (not low-sodium or reduced-sodium). Regular tomato and vegetable juice (not low-sodium or reduced-sodium). Angie Fava. Olives. Fruits Canned fruit in a light or heavy syrup. Fried fruit. Fruit in cream or butter sauce. Meat and other protein foods Fatty cuts of meat. Ribs. Fried meat. Berniece Salines. Sausage. Bologna and other processed lunch meats. Salami. Fatback. Hotdogs. Bratwurst. Salted nuts and seeds. Canned beans with added salt. Canned or smoked fish. Whole eggs or egg yolks. Chicken or [place] with skin. Dairy Whole or 2% milk, cream, and half-and-half. Whole or full-fat cream cheese. Whole-fat or sweetened yogurt. Full-fat cheese. Nondairy creamers. Whipped toppings. Processed cheese and cheese spreads. Fats and oils Butter. Stick margarine. Lard. Shortening. Ghee. Bacon fat. Tropical oils, such as coconut, palm kernel, or palm oil. Seasoning and other foods Salted popcorn and pretzels. Onion salt, garlic salt, seasoned salt, table salt, and sea salt. Worcestershire sauce. Tartar sauce. Barbecue sauce. Teriyaki sauce. Soy sauce, including reduced-sodium. Steak sauce. Canned and packaged gravies. Fish sauce. Oyster sauce. Cocktail sauce. Horseradish that you find on the shelf. Ketchup. Mustard. Meat flavorings and tenderizers. Bouillon cubes. Hot sauce and Tabasco sauce. Premade or packaged marinades. Premade or packaged taco seasonings. Relishes. Regular salad dressings. Where to find more information:  National Heart, Lung, and [place]: https://wilson-eaton.com/  American Heart Association: www.heart.org Summary  The DASH eating plan is a healthy eating  plan that has been shown to reduce high blood pressure (hypertension). It may also reduce your risk for type 2 diabetes, heart disease, and stroke.  With the DASH eating plan, you should limit salt (sodium) intake to 2,300 mg a day. If you have hypertension, you may need to reduce your sodium intake to 1,500 mg a day.  When on the DASH eating plan, aim to eat more fresh fruits and vegetables, whole grains, lean proteins, low-fat dairy, and heart-healthy fats.  Work with your health care provider or diet and nutrition specialist (dietitian) to adjust your eating plan to your individual calorie needs. This information is not intended to replace advice given to you by your health care provider. Make sure you discuss any questions you have with your health care provider. Document Revised: 11/12/2017 Document Reviewed: 11/23/2016 Elsevier Patient Education  [place].  Insomnia Insomnia is a sleep disorder that makes it difficult to fall asleep or stay asleep. Insomnia can cause fatigue, low energy, difficulty concentrating, mood swings, and poor performance at work or school. There are three different ways to classify insomnia:  Difficulty falling asleep.  Difficulty staying asleep.  Waking up too early in the morning. Any type of insomnia can be long-term (chronic) or short-term (acute). Both are common. Short-term insomnia usually  lasts for three months or less. Chronic insomnia occurs at least three times a week for longer than three months. What are the causes? Insomnia may be caused by another condition, situation, or substance, such as:  Anxiety.  Certain medicines.  Gastroesophageal reflux disease (GERD) or other gastrointestinal conditions.  Asthma or other breathing conditions.  Restless legs syndrome, sleep apnea, or other sleep disorders.  Chronic pain.  Menopause.  Stroke.  Abuse of alcohol, tobacco, or illegal drugs.  Mental health conditions, such as  depression.  Caffeine.  Neurological disorders, such as Alzheimer's disease.  An overactive thyroid (hyperthyroidism). Sometimes, the cause of insomnia may not be known. What increases the risk? Risk factors for insomnia include:  Gender. Women are affected more often than men.  Age. Insomnia is more common as you get older.  Stress.  Lack of exercise.  Irregular work schedule or working night shifts.  Traveling between different time zones.  Certain medical and mental health conditions. What are the signs or symptoms? If you have insomnia, the main symptom is having trouble falling asleep or having trouble staying asleep. This may lead to other symptoms, such as:  Feeling fatigued or having low energy.  Feeling nervous about going to sleep.  Not feeling rested in the morning.  Having trouble concentrating.  Feeling irritable, anxious, or depressed. How is this diagnosed? This condition may be diagnosed based on:  Your symptoms and medical history. Your health care provider may ask about: ? Your sleep habits. ? Any medical conditions you have. ? Your mental health.  A physical exam. How is this treated? Treatment for insomnia depends on the cause. Treatment may focus on treating an underlying condition that is causing insomnia. Treatment may also include:  Medicines to help you sleep.  Counseling or therapy.  Lifestyle adjustments to help you sleep better. Follow these instructions at home: Eating and drinking   Limit or avoid alcohol, caffeinated beverages, and cigarettes, especially close to bedtime. These can disrupt your sleep.  Do not eat a large meal or eat spicy foods right before bedtime. This can lead to digestive discomfort that can make it hard for you to sleep. Sleep habits   Keep a sleep diary to help you and your health care provider figure out what could be causing your insomnia. Write down: ? When you sleep. ? When you wake up during  the night. ? How well you sleep. ? How rested you feel the next day. ? Any side effects of medicines you are taking. ? What you eat and drink.  Make your bedroom a dark, comfortable place where it is easy to fall asleep. ? Put up shades or blackout curtains to block light from outside. ? Use a white noise machine to block noise. ? Keep the temperature cool.  Limit screen use before bedtime. This includes: ? Watching TV. ? Using your smartphone, tablet, or computer.  Stick to a routine that includes going to bed and waking up at the same times every day and night. This can help you fall asleep faster. Consider making a quiet activity, such as reading, part of your nighttime routine.  Try to avoid taking naps during the day so that you sleep better at night.  Get out of bed if you are still awake after 15 minutes of trying to sleep. Keep the lights down, but try reading or doing a quiet activity. When you feel sleepy, go back to bed. General instructions  Take over-the-counter and prescription medicines  only as told by your health care provider.  Exercise regularly, as told by your health care provider. Avoid exercise starting several hours before bedtime.  Use relaxation techniques to manage stress. Ask your health care provider to suggest some techniques that may work well for you. These may include: ? Breathing exercises. ? Routines to release muscle tension. ? Visualizing peaceful scenes.  Make sure that you drive carefully. Avoid driving if you feel very sleepy.  Keep all follow-up visits as told by your health care provider. This is important. Contact a health care provider if:  You are tired throughout the day.  You have trouble in your daily routine due to sleepiness.  You continue to have sleep problems, or your sleep problems get worse. Get help right away if:  You have serious thoughts about hurting yourself or someone else. If you ever feel like you may hurt  yourself or others, or have thoughts about taking your own life, get help right away. You can go to your nearest emergency department or call:  Your local emergency services (911 in the [place]  A suicide crisis helpline, such as the [place] at 479-589-2342. This is open 24 hours a day. Summary  Insomnia is a sleep disorder that makes it difficult to fall asleep or stay asleep.  Insomnia can be long-term (chronic) or short-term (acute).  Treatment for insomnia depends on the cause. Treatment may focus on treating an underlying condition that is causing insomnia.  Keep a sleep diary to help you and your health care provider figure out what could be causing your insomnia. This information is not intended to replace advice given to you by your health care provider. Make sure you discuss any questions you have with your health care provider. Document Revised: 11/12/2017 Document Reviewed: 09/09/2017 Elsevier Patient Education  2020 [employer].

## 2020-02-12 ENCOUNTER — Ambulatory Visit: Payer: PPO | Attending: Internal Medicine

## 2020-02-12 DIAGNOSIS — Z23 Encounter for immunization: Secondary | ICD-10-CM | POA: Insufficient documentation

## 2020-02-12 NOTE — Progress Notes (Signed)
Covid-19 Vaccination Clinic  Name:  Tanner Bishop    MRN: [id number] DOB: April 10, 1946  02/12/2020  [name] was observed post Covid-19 immunization for 15 minutes without incidence. He was provided with Vaccine Information Sheet and instruction to access the V-Safe system.   [name] was instructed to call 911 with any severe reactions post vaccine: Marland Kitchen Difficulty breathing  . Swelling of your face and throat  . A fast heartbeat  . A bad rash all over your body  . Dizziness and weakness    Immunizations Administered    Name Date Dose VIS Date Route   Pfizer COVID-19 Vaccine 02/12/2020  9:04 AM 0.3 mL 11/24/2019 Intramuscular   Manufacturer: [place]   Lot: [id number]   [place]: [id number]

## 2020-02-16 ENCOUNTER — Other Ambulatory Visit: Payer: Self-pay | Admitting: Internal Medicine

## 2020-02-28 ENCOUNTER — Other Ambulatory Visit: Payer: Self-pay

## 2020-02-29 ENCOUNTER — Ambulatory Visit (INDEPENDENT_AMBULATORY_CARE_PROVIDER_SITE_OTHER): Payer: PPO | Admitting: Family Medicine

## 2020-02-29 ENCOUNTER — Encounter: Payer: Self-pay | Admitting: Family Medicine

## 2020-02-29 VITALS — BP 130/70 | HR 68 | Temp 97.0°F | Ht 68.0 in | Wt 160.0 lb

## 2020-02-29 DIAGNOSIS — R112 Nausea with vomiting, unspecified: Secondary | ICD-10-CM | POA: Diagnosis not present

## 2020-02-29 DIAGNOSIS — Z0000 Encounter for general adult medical examination without abnormal findings: Secondary | ICD-10-CM | POA: Diagnosis not present

## 2020-02-29 DIAGNOSIS — R21 Rash and other nonspecific skin eruption: Secondary | ICD-10-CM

## 2020-02-29 LAB — AMYLASE: Amylase: 308 U/L — ABNORMAL HIGH (ref 27–131)

## 2020-02-29 LAB — CBC WITH DIFFERENTIAL/PLATELET
Basophils Absolute: 0.1 10*3/uL (ref 0.0–0.1)
Basophils Relative: 0.6 % (ref 0.0–3.0)
Eosinophils Absolute: 0.2 10*3/uL (ref 0.0–0.7)
Eosinophils Relative: 2.2 % (ref 0.0–5.0)
HCT: 42.7 % (ref 39.0–52.0)
Hemoglobin: 14.5 g/dL (ref 13.0–17.0)
Lymphocytes Relative: 38.8 % (ref 12.0–46.0)
Lymphs Abs: 3.6 10*3/uL (ref 0.7–4.0)
MCHC: 33.9 g/dL (ref 30.0–36.0)
MCV: 97.7 fl (ref 78.0–100.0)
Monocytes Absolute: 0.9 10*3/uL (ref 0.1–1.0)
Monocytes Relative: 10 % (ref 3.0–12.0)
Neutro Abs: 4.4 10*3/uL (ref 1.4–7.7)
Neutrophils Relative %: 48.4 % (ref 43.0–77.0)
Platelets: 301 10*3/uL (ref 150.0–400.0)
RBC: 4.37 Mil/uL (ref 4.22–5.81)
RDW: 13.8 % (ref 11.5–15.5)
WBC: 9.2 10*3/uL (ref 4.0–10.5)

## 2020-02-29 LAB — BASIC METABOLIC PANEL
BUN: 18 mg/dL (ref 6–23)
CO2: 35 mEq/L — ABNORMAL HIGH (ref 19–32)
Calcium: 9.2 mg/dL (ref 8.4–10.5)
Chloride: 98 mEq/L (ref 96–112)
Creatinine, Ser: 1.15 mg/dL (ref 0.40–1.50)
GFR: 62.38 mL/min (ref >60.00)
Glucose, Bld: 97 mg/dL (ref 70–99)
Potassium: 4.8 mEq/L (ref 3.5–5.1)
Sodium: 139 mEq/L (ref 135–145)

## 2020-02-29 LAB — LIPID PANEL
Cholesterol: 196 mg/dL (ref 0–200)
HDL: 59.1 mg/dL (ref >39.00)
LDL Cholesterol: 121 mg/dL — ABNORMAL HIGH (ref 0–99)
NonHDL: 136.55
Total CHOL/HDL Ratio: 3
Triglycerides: 77 mg/dL (ref 0.0–149.0)
VLDL: 15.4 mg/dL (ref 0.0–40.0)

## 2020-02-29 LAB — HEPATIC FUNCTION PANEL
ALT: 18 U/L (ref 0–53)
AST: 21 U/L (ref 0–37)
Albumin: 3.6 g/dL (ref 3.5–5.2)
Alkaline Phosphatase: 59 U/L (ref 39–117)
Bilirubin, Direct: 0.1 mg/dL (ref 0.0–0.3)
Total Bilirubin: 0.5 mg/dL (ref 0.2–1.2)
Total Protein: 6.4 g/dL (ref 6.0–8.3)

## 2020-02-29 LAB — TSH: TSH: 2.9 u[IU]/mL (ref 0.35–4.50)

## 2020-02-29 LAB — LIPASE: Lipase: 13 U/L (ref 11.0–59.0)

## 2020-02-29 LAB — PSA: PSA: 1.61 ng/mL (ref 0.10–4.00)

## 2020-02-29 MED ORDER — PANTOPRAZOLE SODIUM 40 MG PO TBEC: 40.0000 mg | DELAYED_RELEASE_TABLET | Freq: Two times a day (BID) | ORAL | 3 refills | Status: DC

## 2020-02-29 NOTE — Progress Notes (Signed)
Subjective:    Patient ID: Tanner Bishop, male    DOB: 1946-09-01, 74 y.o.   MRN: [id number]  HPI Here for a well exam. He has a few concerns. First he has had recurrent morning nausea for the past 2 months. He may go a week without it and then he may have several weeks where he gets nauseated and vomits every other day. There is no abdominal pain. He can see no correlation to the food he eats. He never gets during the day or in the evenings. Second he has had an ithcy rash on the forehead for several months.    Review of Systems  Constitutional: Negative.   HENT: Negative.   Eyes: Negative.   Respiratory: Negative.   Cardiovascular: Negative.   Gastrointestinal: Positive for nausea and vomiting. Negative for abdominal distention, abdominal pain, anal bleeding, blood in stool, constipation and diarrhea.  Genitourinary: Negative.   Musculoskeletal: Negative.   Skin: Positive for rash.  Neurological: Negative.   Psychiatric/Behavioral: Negative.        Objective:   Physical Exam Constitutional:      General: He is not in acute distress.    Appearance: He is well-developed. He is not diaphoretic.  HENT:     Head: Normocephalic and atraumatic.     Right Ear: External ear normal.     Left Ear: External ear normal.     Nose: Nose normal.     Mouth/Throat:     Pharynx: No oropharyngeal exudate.  Eyes:     General: No scleral icterus.       Right eye: No discharge.        Left eye: No discharge.     Conjunctiva/sclera: Conjunctivae normal.     Pupils: Pupils are equal, round, and reactive to light.  Neck:     Thyroid: No thyromegaly.     Vascular: No JVD.     Trachea: No tracheal deviation.  Cardiovascular:     Rate and Rhythm: Normal rate and regular rhythm.     Heart sounds: Normal heart sounds. No murmur. No friction rub. No gallop.   Pulmonary:     Effort: Pulmonary effort is normal. No respiratory distress.     Breath sounds: Normal breath sounds. No wheezing or  rales.  Chest:     Chest wall: No tenderness.  Abdominal:     General: Bowel sounds are normal. There is no distension.     Palpations: Abdomen is soft. There is no mass.     Tenderness: There is no abdominal tenderness. There is no guarding or rebound.  Genitourinary:    Penis: Normal. No tenderness.      Testes: Normal.     Prostate: Normal.     Rectum: Normal. Guaiac result negative.  Musculoskeletal:        General: No tenderness. Normal range of motion.     Cervical back: Neck supple.  Lymphadenopathy:     Cervical: No cervical adenopathy.  Skin:    General: Skin is warm and dry.     Coloration: Skin is not pale.     Findings: No erythema.     Comments: There is an area of macular erythema with scaling on the forehead   Neurological:     Mental Status: He is alert and oriented to person, place, and time.     Cranial Nerves: No cranial nerve deficit.     Motor: No abnormal muscle tone.     Coordination: Coordination normal.  Deep Tendon Reflexes: Reflexes are normal and symmetric. Reflexes normal.  Psychiatric:        Behavior: Behavior normal.        Thought Content: Thought content normal.        Judgment: Judgment normal.           Assessment & Plan:  Well exam. We discussed diet and exercise. Get fasting labs. The etiology of the nausea is not clear. We will add a lipase and amylase to his labs today. We may need for him to see Dr. Loletha Carrow about this. Refer to Dermatology for the rash.  Alysia Penna, MD

## 2020-03-04 ENCOUNTER — Telehealth: Payer: Self-pay | Admitting: Family Medicine

## 2020-03-04 NOTE — Telephone Encounter (Signed)
Tanner Bishop Tanner Bishop returned your call and stated that you can call him back.

## 2020-03-04 NOTE — Addendum Note (Signed)
Addended by: Alysia Penna A on: 03/04/2020 08:02 AM   Modules accepted: Orders

## 2020-03-05 NOTE — Telephone Encounter (Signed)
Pt given lab results 

## 2020-03-12 ENCOUNTER — Ambulatory Visit: Payer: PPO | Attending: Internal Medicine

## 2020-03-12 DIAGNOSIS — Z23 Encounter for immunization: Secondary | ICD-10-CM

## 2020-03-12 NOTE — Progress Notes (Signed)
   Covid-19 Vaccination Clinic  Name:  Tanner Bishop    MRN: [id number] DOB: 1946-07-06  03/12/2020  Tanner Bishop was observed post Covid-19 immunization for 15 minutes without incident. He was provided with Vaccine Information Sheet and instruction to access the V-Safe system.   Tanner Bishop was instructed to call 911 with any severe reactions post vaccine: Marland Kitchen Difficulty breathing  . Swelling of face and throat  . A fast heartbeat  . A bad rash all over body  . Dizziness and weakness   Immunizations Administered    Name Date Dose VIS Date Route   Pfizer COVID-19 Vaccine 03/12/2020  9:07 AM 0.3 mL 11/24/2019 Intramuscular   Manufacturer: [place]   Lot: [id number]   [place]: [id number]

## 2020-03-13 ENCOUNTER — Ambulatory Visit: Payer: PPO | Admitting: Nurse Practitioner

## 2020-03-13 ENCOUNTER — Encounter: Payer: Self-pay | Admitting: Nurse Practitioner

## 2020-03-13 VITALS — BP 112/66 | HR 73 | Temp 96.8°F | Ht 68.0 in | Wt 166.0 lb

## 2020-03-13 DIAGNOSIS — R112 Nausea with vomiting, unspecified: Secondary | ICD-10-CM | POA: Diagnosis not present

## 2020-03-13 NOTE — Progress Notes (Signed)
03/13/2020 Warren Lindahl [id number] 06/17/1946   CHIEF COMPLAINT: Nausea and vomiting   HISTORY OF PRESENT ILLNESS:  Tanner Bishop is a 74 year old male with a past medical history of arthritis, hyperlipidemia, idiopathic pulmonary fibrosis, atrial flutter status post ablation 2016, kidney stones and peptic ulcer disease.  He presents today for further evaluation regarding nausea and vomiting which started approximately 2 months ago.  He develops fairly abrupt onset nausea then vomits partially undigested food which occurs once or twice weekly over the past 2 months. No hematemesis.  The episodes of vomiting typically occur between breakfast and before lunch time.  He often eats cereal with milk or breakfast bar and fruit in the morning with 1 cup of decaffeinated coffee.  He denies having early satiety or abdominal bloat.  He is on Protonix 40 mg twice daily for history of GERD with PUD.  He denies having any dysphagia or heartburn.  He was prescribed an antibiotic approximately 1 month ago due to having a cough.  He was also prescribed prednisone about 1 month ago as well as followed by his pulmonologist with history of idiopathic pulmonary fibrosis.  No other new medications over the past 6 months. He is passing 2-3 normal brown formed stools daily, every once while he has a loose stool.  No rectal bleeding or melena.  No weight loss.  No fever, sweats or chills.  He underwent a colonoscopy 03/01/2017 by Dr. Rosana Hoes which was normal.  An EGD was completed 08/24/2013 by Dr. Deatra Ina which was normal.   CBC Latest Ref Rng & Units 02/29/2020 02/28/2019 02/23/2018  WBC 4.0 - 10.5 K/uL 9.2 8.5 6.9  Hemoglobin 13.0 - 17.0 g/dL 14.5 15.3 14.5  Hematocrit 39.0 - 52.0 % 42.7 45.8 43.1  Platelets 150.0 - 400.0 K/uL 301.0 272.0 282.0   CMP Latest Ref Rng & Units 02/29/2020 06/08/2019 02/28/2019  Glucose 70 - 99 mg/dL 97 - 104(H)  BUN 6 - 23 mg/dL 18 - 15  Creatinine 0.40 - 1.50 mg/dL 1.15 - 1.17  Sodium 135 -  145 mEq/L 139 - 140  Potassium 3.5 - 5.1 mEq/L 4.8 - 4.5  Chloride 96 - 112 mEq/L 98 - 100  CO2 19 - 32 mEq/L 35(H) - 34(H)  Calcium 8.4 - 10.5 mg/dL 9.2 - 9.4  Total Protein 6.0 - 8.3 g/dL 6.4 6.9 7.2  Total Bilirubin 0.2 - 1.2 mg/dL 0.5 0.4 0.6  Alkaline Phos 39 - 117 U/L 59 53 57  AST 0 - 37 U/L _0 ALT 0 - 53 U/L _1 Lipase 13.  Amylase 308.   Past Medical History:  Diagnosis Date  . Allergy   . Arthritis   . Atrial flutter ([place])    a. s/p ablation 03/2015 Dr Lovena Le  . Cataract    removed both eyes  . GERD (gastroesophageal reflux disease)   . H/O hiatal hernia   . Hyperlipidemia   . Injury of right hand    permanent damage after workplace 2009 and 2010 Dr Vernona Rieger [facility]  . Pulmonary fibrosis ([place])    sees Dr. Onnie Graham   . Renal stone 06/2014  . Ulcer    unresolved   Past Surgical History:  Procedure Laterality Date  . ATRIAL FLUTTER ABLATION N/A 04/08/2015   Procedure: ATRIAL FLUTTER ABLATION;  Surgeon: Evans Lance, MD;  Location: [facility] CATH LAB;  Service: Cardiovascular;  Laterality: N/A;  . CATARACT EXTRACTION, BILATERAL  2016  .  COLONOSCOPY  03/01/2017   per Dr. Loletha Carrow, clear, repeat in 5 yrs (brother had colon cancer)   . ESOPHAGOGASTRODUODENOSCOPY  2007  . EXTRACORPOREAL SHOCK WAVE LITHOTRIPSY  06-17-14   per Dr. Jeffie Pollock   . HAND SURGERY Right   . LUNG BIOPSY Left 03/22/2013   Procedure: LUNG BIOPSY;  Surgeon: Melrose Nakayama, MD;  Location: [facility];  Service: Thoracic;  Laterality: Left;  . POLYPECTOMY    . UPPER GASTROINTESTINAL ENDOSCOPY    . VIDEO ASSISTED THORACOSCOPY Left 03/22/2013   Procedure: VIDEO ASSISTED THORACOSCOPY;  Surgeon: Melrose Nakayama, MD;  Location: [place];  Service: Thoracic;  Laterality: Left;    reports that he quit smoking about 41 years ago. His smoking use included cigarettes. He has a 16.00 pack-year smoking history. He has never used smokeless tobacco. He reports that he does not drink alcohol or use  drugs. family history includes Cancer in an other family member; Colon cancer in his brother; Diabetes in his brother, sister, and another family member; Heart disease in his brother; Liver cancer in his mother; Lung cancer in his sister. No Known Allergies    Outpatient Encounter Medications as of 03/13/2020  Medication Sig  . chlorpheniramine-HYDROcodone (TUSSIONEX PENNKINETIC ER) 10-8 MG/5ML SUER Take 5 mLs by mouth 2 (two) times daily as needed for cough (Do not take with other sedating medication).  . diphenhydrAMINE (BENADRYL) 25 MG tablet Take 25 mg by mouth every 6 (six) hours as needed for allergies or sleep. Patient reports taking at bedtime  . flecainide (TAMBOCOR) 50 MG tablet TAKE 1 TABLET BY MOUTH THREE TIMES A DAY  . fluticasone (FLONASE) 50 MCG/ACT nasal spray Place 2 sprays into both nostrils daily.  . Nintedanib (OFEV) 150 MG CAPS Take 1 capsule (150 mg total) by mouth 2 (two) times daily.  . pantoprazole (PROTONIX) 40 MG tablet Take 1 tablet (40 mg total) by mouth 2 (two) times daily.  . predniSONE (DELTASONE) 5 MG tablet TAKE 1 TABLET BY MOUTH EVERY DAY WITH BREAKFAST  . zolpidem (AMBIEN) 5 MG tablet TAKE 1 TABLET BY MOUTH AT BEDTIME AS NEEDED FOR SLEEP.   Facility-Administered Encounter Medications as of 03/13/2020  Medication  . 0.9 %  sodium chloride infusion     REVIEW OF SYSTEMS: All other systems reviewed and negative except where noted in the History of Present Illness.   PHYSICAL EXAM: BP 112/66 (BP Location: Left Arm, Patient Position: Sitting, Cuff Size: Normal)   Pulse 73   Temp (!) 96.8 F (36 C)   Ht _0  (1.727 m)   Wt 166 lb (75.3 kg)   SpO2 96%   BMI 25.24 kg/m  General: Well developed 74 year old male in no acute distress. Head: Normocephalic and atraumatic. Eyes:  Sclerae non-icteric, conjunctive pink. Ears: Normal auditory acuity. Mouth: Upper and lower partial plates. No ulcers or lesions.  Neck: Supple, no lymphadenopathy or thyromegaly.   Lungs: Breath sounds slightly diminished throughout. Few expiratory wheezes.  Heart: Regular rate and rhythm. No murmur, rub or gallop appreciated.  Abdomen: Soft, nontender, non distended. No masses. No hepatosplenomegaly. Normoactive bowel sounds x 4 quadrants.  Rectal: Deferred.  Musculoskeletal: Symmetrical with no gross deformities. Skin: Warm and dry. No rash or lesions on visible extremities. Extremities: No edema. Neurological: Alert oriented x 4, no focal deficits.  Psychological:  Alert and cooperative. Normal mood and affect.  ASSESSMENT AND PLAN:  19. 74 year old male with nausea and vomiting without associated abdominal pain. Remote history  of peptic ulcer disease. Amylase elevated with normal Lipase and LFTs. -Abdominal sonogram to evaluate the gallbladder. -Lactaid with each dairy product  -I discussed scheduling an EGD, however, the patient prefers to proceed with an abdominal ultrasound if negative he will follow up with   Dr.  Loletha Carrow to determine if an EGD to be completed. -Consider repeat amylase and lipase level if he continues to vomit.  If his abdominal ultrasound is negative consider abdominal/pelvic CT with   contrast to further evaluate the pancreas and GI tract. -Follow up in office in 4 weeks  2.  History of atrial flutter on Flecainide.  3.  History of idiopathic pulmonary fibrosis on Ofev followed by pulmonologist [name]  4. Family history of colon cancer. Next colonoscopy due March 2023    CC:  [name], MD

## 2020-03-13 NOTE — Patient Instructions (Addendum)
If you are age 73 or older, your body mass index should be between 23-30. Your Body mass index is 25.24 kg/m. If this is out of the aforementioned range listed, please consider follow up with your Primary Care Provider.  If you are age 45 or younger, your body mass index should be between 19-25. Your Body mass index is 25.24 kg/m. If this is out of the aformentioned range listed, please consider follow up with your Primary Care Provider.   You have been scheduled for an abdominal ultrasound at [place]  on 03/19/2020 at 10:00. Please arrive 15 minutes prior to your appointment for registration. Make certain not to have anything to eat or drink after midnight. Should you need to reschedule your appointment, please contact radiology at [phone]. This test typically takes about 30 minutes to perform.  Please purchase the following medications over the counter and take as directed:  1. Use Lactaid 1 to 2 tablets daily with each dairy products. 2. Continue taking your Pantoprazole 34m twice a day. 3. Follow up with Dr DLoletha Carrowin 4 weeks to discuss an endoscopy. 04/12/2020 at 1:20.  Due to recent changes in healthcare laws, you may see the results of your imaging and laboratory studies on MyChart before your provider has had a chance to review them.  We understand that in some cases there may be results that are confusing or concerning to you. Not all laboratory results come back in the same time frame and the provider may be waiting for multiple results in order to interpret others.  Please give [id number] hours in order for your provider to thoroughly review all the results before contacting the office for clarification of your results.   [facility] CRNP

## 2020-03-17 NOTE — Progress Notes (Signed)
____________________________________________________________  Attending physician addendum:  Thank you for sending this case to me. I have reviewed the entire note, and the outlined plan seems appropriate.  Ultrasound is fine to evaluate GB, but I suspect it will not find an explanation for his symptoms.  EGD would then be warranted.  Please forward me the [place] report.  Wilfrid Lund, MD  ____________________________________________________________

## 2020-03-19 ENCOUNTER — Other Ambulatory Visit: Payer: PPO

## 2020-04-08 ENCOUNTER — Other Ambulatory Visit (HOSPITAL_COMMUNITY)
Admission: RE | Admit: 2020-04-08 | Discharge: 2020-04-08 | Disposition: A | Discharge status: Home / Self Care | Payer: PPO | Source: Ambulatory Visit | Attending: Internal Medicine | Admitting: Internal Medicine

## 2020-04-08 DIAGNOSIS — Z01812 Encounter for preprocedural laboratory examination: Secondary | ICD-10-CM | POA: Diagnosis not present

## 2020-04-08 DIAGNOSIS — Z20822 Contact with and (suspected) exposure to covid-19: Secondary | ICD-10-CM | POA: Diagnosis not present

## 2020-04-08 LAB — SARS CORONAVIRUS 2 (TAT 6-24 HRS): SARS Coronavirus 2: NEGATIVE

## 2020-04-11 ENCOUNTER — Ambulatory Visit: Payer: PPO | Admitting: Internal Medicine

## 2020-04-11 ENCOUNTER — Other Ambulatory Visit: Payer: Self-pay

## 2020-04-11 ENCOUNTER — Ambulatory Visit (INDEPENDENT_AMBULATORY_CARE_PROVIDER_SITE_OTHER): Payer: PPO | Admitting: Internal Medicine

## 2020-04-11 ENCOUNTER — Telehealth: Payer: Self-pay | Admitting: Internal Medicine

## 2020-04-11 ENCOUNTER — Other Ambulatory Visit (INDEPENDENT_AMBULATORY_CARE_PROVIDER_SITE_OTHER): Payer: PPO

## 2020-04-11 ENCOUNTER — Encounter: Payer: Self-pay | Admitting: Internal Medicine

## 2020-04-11 VITALS — BP 120/64 | HR 61 | Temp 98.1°F | Ht 67.5 in | Wt 166.0 lb

## 2020-04-11 DIAGNOSIS — R05 Cough: Secondary | ICD-10-CM

## 2020-04-11 DIAGNOSIS — Z5181 Encounter for therapeutic drug level monitoring: Secondary | ICD-10-CM

## 2020-04-11 DIAGNOSIS — J84112 Idiopathic pulmonary fibrosis: Secondary | ICD-10-CM

## 2020-04-11 DIAGNOSIS — R748 Abnormal levels of other serum enzymes: Secondary | ICD-10-CM

## 2020-04-11 DIAGNOSIS — R053 Chronic cough: Secondary | ICD-10-CM

## 2020-04-11 LAB — PULMONARY FUNCTION TEST
DL/VA % pred: 110 %
DL/VA: 4.46 ml/min/mmHg/L
DLCO cor % pred: 62 %
DLCO cor: 14.76 ml/min/mmHg
DLCO unc % pred: 62 %
DLCO unc: 14.72 ml/min/mmHg
FEF 25-75 Pre: 2.92 L/sec
FEF2575-%Pred-Pre: 136 %
FEV1-%Pred-Pre: 69 %
FEV1-Pre: 1.99 L
FEV1FVC-%Pred-Pre: 122 %
FEV6-%Pred-Pre: 60 %
FEV6-Pre: 2.23 L
FEV6FVC-%Pred-Pre: 106 %
FVC-%Pred-Pre: 56 %
FVC-Pre: 2.23 L
Pre FEV1/FVC ratio: 89 %
Pre FEV6/FVC Ratio: 100 %

## 2020-04-11 LAB — HEPATIC FUNCTION PANEL
ALT: 12 U/L (ref 0–53)
AST: 21 U/L (ref 0–37)
Albumin: 4.2 g/dL (ref 3.5–5.2)
Alkaline Phosphatase: 49 U/L (ref 39–117)
Bilirubin, Direct: 0.1 mg/dL (ref 0.0–0.3)
Total Bilirubin: 0.6 mg/dL (ref 0.2–1.2)
Total Protein: 7.3 g/dL (ref 6.0–8.3)

## 2020-04-11 LAB — AMYLASE: Amylase: 344 U/L — ABNORMAL HIGH (ref 27–131)

## 2020-04-11 LAB — LIPASE: Lipase: 14 U/L (ref 11.0–59.0)

## 2020-04-11 NOTE — Patient Instructions (Addendum)
ICD-10-CM   1. IPF (idiopathic pulmonary fibrosis) ([place])  J84.112   2. Chronic cough  R05   3. Encounter for therapeutic drug monitoring  Z51.81   4. Elevated amylase  R74.8     Shortness of breath roughly the same compared to last year but may be a bit worse compared to June 2020 in terms of your symptom severity  On breathing tests lung function test is steady in the last 1 year but definitely over the last several years it is slow and steadily worse  His subjective feeling of shortness of breath worsening might be due to pulmonary fibrosis or strong heart rate medication -regardless it is only a mild difference.  We do need to keep an eye  I think the nintedanib is working well for you -you have been on this now for 6 years  I think the prednisone low-dose is also helping her cough but is causing skin issues  Last months nausea might be due to something else as opposed to nintedanib  Plan -Check liver function test and amylase and lipase today -Continue nintedanib 150 mg twice daily with food -glad you are tolerating it well -Continue prednisone 5 mg/day -Continue to monitor your symptoms -if they are getting worse come sooner  -In 3 to 4 months to spirometry and DLCO -In the future we can consider you for clinical trial -Hold off on doing repeat CT chest for the moment [last 14 January 2019) but if shortness of breath getting worse then we will do it  Follow-up -3 to 4 months and 30-minute slot preferably ILD clinic or any other day with [name] but after spirometry and DLCO

## 2020-04-11 NOTE — Progress Notes (Signed)
Spiro/DLCO performed today.

## 2020-04-11 NOTE — Telephone Encounter (Signed)
Dear Dr Sarajane Jews (PCP [name], MD_   LFT on ofev is normal but amylase continues to be high. Not sure is drug related. Passing it on  Thanks    SIGNATURE    Dr. Brand Males, M.D., F.C.C.P,  Pulmonary and Critical Care Medicine Staff Physician, [place] Director - Interstitial Lung Disease  Program  Pulmonary [place] [facility] [place], [place], [address]  Pager: [phone], If no answer or between  15:00h - 7:00h: call [phone] Telephone: [phone]  4:30 PM 04/11/2020

## 2020-04-11 NOTE — Progress Notes (Signed)
#IPF - uip path 03/22/13  - On OFEV since early May 2015   PFT FVC fev1 ratio BD fev1 TLC DLCO Walk test 177f x 3 laps wt rx             Spring 2014 2.9:L L/% /%        June 2015 2.7L/67% 2.34L/78% 86  3.68/57% 20.24/71%   ofev start  [name]  2016 2.5L/55% 2.1L/60% 84/108%    No desat, Pk HR 130    02/11/2015 Screening visit afferent cough study 2.59L/56% 2.24L/63% 87/112%    Dx with afluttter on ekg   Screen failed due to hx of stones  03/26/2015        No desat, PK HR 130    08/26/15 pft machine 2.73L/68% 2.38L/80% 87   [name]     06/29/2016 Office spiro 2.49L/56% 2.14L/65%     Pk HR 90 and lowest pulse ox 99% ->93%  ofev  11/02/2016  office full pft  2.61L/66% 2.3L/79%    17.23/60%   pfev  09/16/2017  2.53L/64%      Pulse ox 99% -> 93%, HR 81- > 84  ofev  12/22/2017        100% -> 97%,  HR 66 -> 96    09/19/2018        100% -> 93, HR 74 -> 85         OV 03/26/2015  Chief Complaint  Patient presents with  . Follow-up    [name], dry cough. CATH procedure on 04/08/15. Denies any chest tightnes/congestion.    74 year old male with idiopathic pulmonary fibrosis. He is on Ofev after having failed Esbriet in the past due to side effects. He is tolerating Ofev well. Liver function test in March 2016 was normal. I last saw him in January 2016. Subsequently in February 2016 we screened him for a cough idiopathic pulmonary fibrosis study called afferent but he screen failed due to history of renal stones. At this time he was incidentally diagnosed with new onset atrial flutter. He has seen Dr. GCrissie Sicklesand has been started on anticoagulation with Xarelto and metoprolol. He is due to undergo a ablation. He is not aware of the increased bleeding risk with Ofev in the setting of anticoagulation. He assures me that the anti-coag and is only until he undergoes ablation and after that the plan is to stop it. Therefore only short-term anticoagulation with Xarelto.  Overall his effort tolerance is fine. He has postponed his [facility] lung transplant until he completes his ablation.  Walking desaturation test in the office today he did not desaturate. This is stable. Spirometry not done   Past medical history: Atrial flutter new diagnoses as above   OV 06/29/2016  Chief Complaint  Patient presents with  . Follow-up    pt states he is at baseline: sob with exertion, nonprod cough.      74 year old male with idiopathic pulmonary fibrosis. He is on Ofev . He completed 6 months of PRAISE study (([id number] placebo) in l;ate winter/early spring 2017. This is [place]. Overall stable. No worsening dyspnea and cough. Continues daily exercise is walking many miles. Some days he is sitting more than the others. He is interested in more trials and results of the praise study. These results are not out yet. He is compliant with his Ofe last liver function test was in February 2017. There are no new issues. He believes his atrial fibrillation is in  sinus rhythm;    OV  11/02/2016  Chief Complaint  Patient presents with  . Follow-up    4 month IPF follow up and B&A review - does report increased prod cough with yellow mucus, wheezing, tightness in chest, increased SOB, x3-4 weeks with the weather change.  denies any f/c/s, hemoptysis, chest pain   IPF - on ofev. Last liver function test was in July 2017 and normal. Overall dyspnea stable. However he is been having diarrhea with ofev. He called in and we reduced it to 1 tablet a day and this improved the diarrhea. He back on 1 tablet twice a day. The diarrhea has returned although it is much milder. He has never taken any medication for this. Are function test shows slight improvement from July 2017 and similar levels to approximately one year ago  The new issue that he's having significant recurrent sinus infection in the back of chronic postnasal drainage. This since the fall 2017. He says that he and his wife catch  respirator infection and passive back and forth to each other. He is having cough, chest tightness, postnasal drip and yellow sputum. It is not affecting any dyspnea. There is no wheeze. There is no fever or hemoptysis.  OV 03/02/2017  Chief Complaint  Patient presents with  . Follow-up    Pt states his breathing is at baseline. Pt c/o dry cough - pt states this is baseline. Pt denies CP/tightness.     Idiopathic pulmonary fibrosis on Ofev. Last seen November 2017. He is a routine follow-up.  He is here with his wife. His sinus infections a result. He is interested in research protocols but we have none right now. He is tolerating his Ofev associated with some mild diarrhea occasional which she takes medication. He is not much of a problem. He tells me that she walks 6 times a week for 5 miles a day. On the treadmill at a 5 incline walking at 3-1/2 miles an hour. For this he feels a little more dyspneic than before. Walking desaturation test 185 feet 3 laps on room air: His pulse ox dropped from 98% at rest and 93% with exertion. His heart rate jumped from 66 at rest and 97 at peak exertion. Features are suggestive of mild progression of the disease. He is not attending pulmonary fibrosis foundation because he felt this was negative but that was years ago. We discussed this and he might be interested again.     OV 09/16/2017  Chief Complaint  Patient presents with  . Follow-up    PFT done today. Pt states that his breathing is gradually getting worse. SOB which depends if it is on exertion vs all the time and c/o prod cough with yellow mucus. Denies any CP   S IPF on fev followup. Overall doing well. He recent bronchitis and liked anabiotic and prednisone. He felt the prednisone helped his cough just currently rated as mild to moderate in severity. Helped the shortness of breath. He also helped arthralgia. He is now wondering what taking chronic prednisone. We had an extensive discussion  about this. Spirometry shows that the FVC stable compared to earlier this year but slightly progressive compared to one year ago. [facility] interstitial lung disease questionnaire shows symptoms. His wife is here with him. He is interested in research trials. He has participated in distress before. He is asking about opioid refill for cough if I wont do prednisone.   OV 12/22/2017  Chief Complaint  Patient presents  with  . Follow-up    Pt states he believes his breathing is at a stable point right now. States that he is coughing which is worse when he first wakes up and then also at night; occ will cough up yellow phlegm.     C.o ofev intolerance with fatigue, weight loss, diarrhea. Does not like lomotil. Wnaant to give it a holiday foir 2-3 weeks. INterested in research protocol - discussed [place]. 5 year cut off since diagnosis coming up 03/22/18. He is overall frustrated ith qualtiy of life. Walks 5 miles; starts feeling dizzy > 3 miles in but not checking pulse ox despite advice. Walking desaturation test on 12/22/2017 185 feet x 3 laps on ROOM AIR:  did noit desaturate. Rest pulse ox was 100%, final pulse ox was 97%. HR response 66/min at rest to 96/min at peak exertion. Patient Rease Hutmacher  Did not Desaturate < 88% . Latrail Deal yes  Desaturated </= 3% points. Asahd Mizell yes did get tachyardic     Walking desaturation test on 02/01/2018 185 feet x 3 laps on ROOM AIR:  did not desaturate. Rest pulse ox was 100%, final pulse ox was 91%. HR response 67/min at rest to 85/min at peak exertion. Patient Felix Betts  dod not Desaturate < 88% . Eliel Luppino yes diod  Desaturated </= 3% points. Kendre Crichlow did not get tachyardic   OV 02/01/2018  Chief Complaint  Patient presents with  . Follow-up    Pt states he has been doing good since last visit. Started back on OFEV 12/24/17 after taking a holiday off of it.     FU IPF  Took ofev holiday. And then restarted 01/24/18 and doing well.  [place] ofev ok. KBILD ok.   OV 03/16/2018'  . Chief Complaint  Patient presents with  . Follow-up    PFT done today. States he has been doing well and denies any current complaints. Tolerating OFEV well and has been walking about 5 miles every day with no complaints.   Ms. Hoos returns for follow-up of idiopathic pulmonary fibrosis.  He is now back on nintedanib after giving it a holiday for a few weeks.  He is tolerating it well and no problems.  He goes for daily walks and has no problems.  His lung function was reviewed today and it shows for the first time a significant decline of greater than 200 cc between 2 time points.  This correlates with him not taking over for a few weeks nevertheless overall he is happy with his health.  He is not interested in research trials anymore.  He has a new question about travel advice encounter.  He plans an [place] from [place], [place] intrajejunal in [place] for 10 days.  This will start on May 22, 2018.  He wants to make sure it is safe for him to go.  He said he has never been on a cruise and he and his wife have a strong desire to go as part of his goals of care.   OV 05/16/2018  Chief Complaint  Patient presents with  . Follow-up    Breathing is unchanged since last OV.    Tanner Bishop , 74 y.o. , with dob [birth year] and male ,Not Hispanic or Latino from [address] [place] [address] - presents to pulm ILD clinic for IPF.  He presents with his wife.  And just under 7 days he is going to embark on a [place] cruise.  This visit is precautionary visit just before that.  Most recent visit was in April 2019.  At this point in time in terms of his IPF he feels stable.  He is compliant with full dose of 5 and is having only mild diarrhea.  Other than that he is tolerating it just fine.  He is looking forward to his cruise.  He had pulmonary function test today that shows an improvement compared to last visit and it  is very similar to summer/fall 2018 in terms of FVC and DLCO.  Although he does feel like he is coming down with an acute bronchitis and wants to take some antibiotic and prednisone I had of the trip.  We also discussed about him having prophylactic antibiotics handy.  He wants a refill on his Tussionex for cough.  At this point in time he is past 5 years of diagnosis and not eligible for research trials he is not interested in transplant although the recent pulmonary fibrosis foundation meeting he did hear from a lot of transplant patients about the individual personal experience.       OV 09/19/2018  Subjective:  Patient ID: Tanner Bishop, male , DOB: Jul 06, 1946 , age 51 y.o. , MRN: [id number] , ADDRESS: [place] [id number]   09/19/2018 -   Chief Complaint  Patient presents with  . Follow-up    Pt saw cards 08/23/18. States he is still having problems with his heart going in and out of rhythm and states he has had some episodes to where he almost passes out. States his breathing is at a stable point. States he also has  an occ cough.     HPI Tanner Bishop 74 y.o. -presents 5-year follow-up.  I personally saw him in June 2019.  Then approximately a month ago he presented and saw acutely my nurse practitioner because of dizziness.  It was felt to be A. fib RVR.  He subsequently saw Dr. Crissie Sickles his electrophysiologist.  I reviewed the note.  Flecainide was started.  He says despite that l approximately 10 weeks ago he had another episode of dizziness while getting out of the shower.  He was tachycardic with a heart rate of 140s.  He felt he might pass out but there is no focal neurologic deficits or abnormal sensation.  His pulse ox was normal at 97% at that time.  It happened at rest.  He says he has been walking on the treadmill for several miles and he does not desaturate.  He says his pulse ox is 93% at that time.  He never drops into the 80s.  He does not think his dizziness  and palpitation issues are related to his pulmonary fibrosis.  However [name] is wondering about oxygen drops during this time.  He is not tolerating his nintedanib at full dose without any problems.  He has had a successful [place] trip.  He is participating in the patient support group.  He is up-to-date with his flu shot      OV 10/13/2018  Subjective:  Patient ID: Tanner Bishop, male , DOB: 20-Dec-1945 , age 17 y.o. , MRN: [id number] , ADDRESS: [place] [id number]   10/13/2018 -   Chief Complaint  Patient presents with  . Follow-up    review PFT.  c/o baseline sob with exertion.       HPI Tanner Bishop 74 y.o. -presents for follow-up of IPF to the ILD clinic.  This visit  is arranged because letter physiologist [name] was concerned about hypoxemia effects on his atrial fibrillation.  At this point in time patient tells me that after increasing dose of flecainide his atrial fibrillation and palpitations have improved and resolved.  Overall he feels stable.  On September 26, 2018 he had 6-minute walk test and this was pretty robust with walking 384 meters and with lowest pulse ox of 95%.  Then on October 05, 2018 he had overnight pulse oximetry that shows 12 minutes of desaturation less than 88%.  He is not interested in oxygen.  Then he called his October 07, 2018 with worsening bronchitis episodes and we gave him 5 days of prednisone and antibiotics.  He finished his last dose yesterday.  Overall he feels stable but pulmonary function test today shows decline in both FVC and DLCO.  And then when I questioned him he felt like he still had some residual bronchiti and feels another course of prednisone could help him.  There are no other new issues.  Last liver function test October 2019 and was normal.      OV 01/12/2019  Subjective:  Patient ID: Tanner Bishop, male , DOB: March 16, 1946 , age 67 y.o. , MRN: [id number] , ADDRESS: [place]  [id number]   01/12/2019 -   Chief Complaint  Patient presents with  . Follow-up    PFT performed today. Pt states he is about the same since last visit.States he still becomes SOB with exertion, has a lot of coughing but has been taking mucinex, and also has had some CP or chest tightness.     HPI Tanner Bishop 74 y.o. -returns for follow-up of his IPF.  He is now more than 5 years since diagnosis.,  April 2020 he will be 6 years since diagnosis.  He continues to exercise well on the treadmill and according to his wife he is does well.  He is not dropping oxygen at this time.  He does not have a cough when needed works on the tile.  However at other times the day especially when he is trying to lie down to bed or when he gets up in the morning and goes to the bathroom he has really bad cough.  Overall the cough severity is now 6 out of 10 in terms of how it is impacting his quality of life.  He tells me that every time he takes prednisone for the cough he feels better but then when he comes off prednisone cough gets worse.  He said multiple prednisone courses of brief duration for this chronic cough.  In addition for the last few weeks has had yellow sinus drainage and sinus headache and sinus congestion and he feels this is again making his chronic cough worse.  He is open to taking antibiotic and prednisone course.  In terms of taking nintedanib he has no side effects.  His last liver function test reviewed was in October 2019 and it was normal at that time.  Overall at this point in time he really wants good significant support for his cough in terms of affecting his quality of life.    ROS - per HPI     OV 02/16/2019  Subjective:  Patient ID: Tanner Bishop, male , DOB: [birth year] , age 43 y.o. , MRN: [id number] , ADDRESS: [address] [place] [address]   02/16/2019 -   Chief Complaint  Patient presents with  . Follow-up    HRCT and sinus  CT performed 2/18. Pt states he is about the same  as last visit and states SOB is about the same. Pt still has a dry cough. Denies any complaints of CP/chest tightness.     HPI Tanner Bishop 74 y.o. -returns for follow-up of his IPF.  He presents with his wife.  He is here to review the test results.  He had high-resolution CT chest and CT sinus.  These were done in order to reevaluate his disease because he is having significant cough.  His high-resolution CT chest shows only mild progression in his IPF in 3 years.  However there is significant new findings of tracheobronchomalacia although there is no lung cancer.  He also has two-vessel coronary artery calcification.  These are new findings.  Since last visit and a sinus episode he is stable.  He is currently run out of his nasal steroids.  He says the pharmacy said that I declined his prescription request.  I do not recollect such a conversation.  In any event he is now going to participate in the cough study called scenic.  He has received a copy of the consent form.  He is rated.  He has an appointment for his consent visit.  It is possible that the nasal steroid is on the exclusion list but I do not have the exclusion list with me.  Anyway he is not consented.  He is not having chest pain when he does his treadmill exercises.  He works out hard.  He states he gets tachycardic.  He says he has a 6 times that he will die from his heart condition before his pulmonary fibrosis.  He has upcoming appointment with his cardiologist [name] for atrial fibrillation.  ...................................................................... OV 06/08/2019   Subjective:  Patient ID: Tanner Bishop, male , DOB: October 28, 1946 , age 54 y.o. , MRN: [id number] , ADDRESS: [place] [id number]   06/08/2019 -   Chief Complaint  Patient presents with  . Follow-up    1wk f/u for IPF. Patient stated that he was given a round of prednisone last week for some SOB but is feeling much better.       HPI Tanner Bishop 74 y.o. -returns for IPF follow-up.  Last seen in March 2020.  Since the pandemic started he has been social distancing.  He has not attended any support group.  He has been tolerating his Ofev quite well without any difficulty.  Recently had a bronchitis exacerbation and we gave prednisone.  This resolved his cough.  He was going to participate in a cough study but because of the pandemic the study got canceled.  He tells me that every time he takes prednisone the cough goes away.  He is very interested in taking daily prednisone for symptom relief of cough.  We discussed the side effects of prednisone that include weight gain, easy bruising, adrenal insufficiency, osteoporosis, cataracts hypertension, diabetes, immunosuppression.  Despite all this he wants to take a low-dose prednisone daily.  His last liver function test was in March 2020.  This needs to be retested because he is on nintedanib.  His symptom scores at walk test show relative stability.  His last pulmonary function test was in January 2020 but because of the pandemic is on hold.  We are using symptom and walk test to determine progression.         OV 04/11/2020  Subjective:  Patient ID: Tanner Bishop, male , DOB: 07-17-1946 , age 6  y.o. , MRN: [id number] , ADDRESS: [address] [place] [address]   04/11/2020 -   Chief Complaint  Patient presents with  . Follow-up    PFT performed today. Pt states he feels like his breathing is gradually becoming worse and states it could be at any time that he is SOB.   #IPF - uip path 03/22/13  - On OFEV since early May 2015 -On chronic daily prednisone because of cough since 2020  HPI Tanner Bishop 73 y.o. -presents with his wife IPF follow-up.  Last saw him June 2020.  After that he feels his dyspnea is worse.  He says that he could do several miles in one 1 hour and 30 minutes on the treadmill.  The same distance and now taking 1 hour and 40 minutes.  He  feels his disease is getting worse.  He says the prednisone is working well for him because of improved cough and wellbeing.  He is having some skin bruising because of that.  The nintedanib he is tolerating well except for some mild diarrhea.  On objective symptom score is actually similar to 1 year ago but may be slightly worse than 6 months ago or 9 months ago.  On pulmonary function testing he is stable compared to 1 year ago but slow and steadily he is definitely worse over the many years.  We noticed on his walking desaturation test that he did not mount a tachycardic response as he is done in the past.  He is on flecainide for atrial fibrillation.  He takes his 3 times daily.  He feels his atrial fibrillation is under good control.  There is no chest pain.  There is no weight loss.  Is no loss of physical conditioning.  He is interested in clinical trials.   A month ago he had nausea and his amylase/lipase was elevated.  He says he is fine now.  Primary care address this.  SYMPTOM SCALE - ILD 02/16/2019  06/08/2019  04/11/2020   O2 use ra ra ra  Shortness of Breath 0 -> 5 scale with 5 being worst (score 6 If unable to do)    At rest 1 1 0  Simple tasks - showers, clothes change, eating, shaving _0 Household (dishes, doing bed, laundry) _1 Shopping _2 Walking level at own pace _3 Walking up Stairs _4 Total (40 - 48) Dyspnea Score _5 How bad is your cough? 3 Xx - due to recent pred 2  How bad is your fatigue 3 x 2  nausea   0  vomit   0  diarrhea   2  anxiety   0  deopresso   0       Simple office walk 185 feet x  3 laps goal with forehead probe 09/19/2018  01/12/2019  02/16/2019  06/08/2019  04/11/2020   O2 used Room air Room air Room air Room air   Number laps completed _6 Comments about pace normal normal normal normal   Resting Pulse Ox/HR 100% and 73/min 100% ad 74/min 100% and 68/min 98% and 82/min 99% and 60/min  Final Pulse Ox/HR 93% and  85/min 92% and 84/min 93% and 81/min 91% and 96/mni 91% and 74/mm  Desaturated </= 88% no no no no yes  Desaturated <= 3% points yes Yes, 8 ponts Yes, 7 points Yes, 7 points Yes,  8point  Got Tachycardic >/= 90/min no no no yes   Symptoms at end of test x none none none dyspnea  Miscellaneous comments x x stable stable but tachy for first time      Results for Tanner Bishop, Tanner Bishop (MRN [id number]) as of 04/11/2020 12:14  Ref. Range 05/14/2014 15:46 07/24/2015 12:43 08/26/2015 10:52 11/02/2016 09:51 05/19/2017 08:41 09/16/2017 08:47 03/16/2018 08:48 05/16/2018 10:53 10/13/2018 09:52 01/12/2019 09:49 04/11/2020 10:56  FVC-Pre Latest Units: L 2.71 2.69 2.73 2.61 2.51 2.53 2.29 2.38 2.25 2.21 2.23  FVC-%Pred-Pre Latest Units: % _0 56  Results for Tanner Bishop, Tanner Bishop (MRN [id number]) as of 04/11/2020 12:14  Ref. Range 05/14/2014 15:46 07/24/2015 12:43 08/26/2015 10:52 11/02/2016 09:51 05/19/2017 08:41 09/16/2017 08:47 03/16/2018 08:48 05/16/2018 10:53 10/13/2018 09:52 01/12/2019 09:49 04/11/2020 10:56  DLCO unc Latest Units: ml/min/mmHg 20.24 15.36 15.36 17.23 15.83  13.10 16.48 14.17 14.38 14.72  DLCO unc % pred Latest Units: % 71 54 54 60 55  46 58 50 50 62    ROS - per HPI  IMPRESSION: 1. 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 Findings are consistent with UIP per consensus guidelines: Diagnosis of Idiopathic Pulmonary Fibrosis: An Official ATS/ERS/JRS/ALAT Clinical Practice Guideline. [place], Iss 5, [phone], Aug 14 2017. 2. 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 3. Two-vessel coronary atherosclerosis.  Aortic Atherosclerosis (ICD10-I70.0).   Electronically Signed   By: Ilona Sorrel M.D.   On: 01/31/2019 12:24   has a past medical history of Allergy, Arthritis, Atrial flutter ([place]), Cataract, GERD (gastroesophageal reflux  disease), H/O hiatal hernia, Hyperlipidemia, Injury of right hand, Pulmonary fibrosis ([place]), Renal stone (06/2014), and Ulcer.   reports that he quit smoking about 41 years ago. His smoking use included cigarettes. He has a 16.00 pack-year smoking history. He has never used smokeless tobacco.  Past Surgical History:  Procedure Laterality Date  . ATRIAL FLUTTER ABLATION N/A 04/08/2015   Procedure: ATRIAL FLUTTER ABLATION;  Surgeon: Evans Lance, MD;  Location: [facility] CATH LAB;  Service: Cardiovascular;  Laterality: N/A;  . CATARACT EXTRACTION, BILATERAL  2016  . COLONOSCOPY  03/01/2017   per Dr. Loletha Carrow, clear, repeat in 5 yrs (brother had colon cancer)   . ESOPHAGOGASTRODUODENOSCOPY  2007  . EXTRACORPOREAL SHOCK WAVE LITHOTRIPSY  06-17-14   per Dr. Jeffie Pollock   . HAND SURGERY Right   . LUNG BIOPSY Left 03/22/2013   Procedure: LUNG BIOPSY;  Surgeon: Melrose Nakayama, MD;  Location: [place];  Service: Thoracic;  Laterality: Left;  . POLYPECTOMY    . UPPER GASTROINTESTINAL ENDOSCOPY    . VIDEO ASSISTED THORACOSCOPY Left 03/22/2013   Procedure: VIDEO ASSISTED THORACOSCOPY;  Surgeon: Melrose Nakayama, MD;  Location: [place];  Service: Thoracic;  Laterality: Left;    No Known Allergies  Immunization History  Administered Date(s) Administered  . Fluad Quad(high Dose 65+) 08/25/2019  . Influenza Split 09/13/2013  . Influenza, High Dose Seasonal PF 10/06/2016, 09/16/2017, 08/17/2018  . Influenza-Unspecified 09/07/2014, 09/14/2015  . PFIZER SARS-COV-2 Vaccination 02/12/2020, 03/12/2020  . Pneumococcal Conjugate-13 02/16/2017  . Pneumococcal Polysaccharide-23 05/26/2013  . Td 12/15/2007  . Tdap 02/23/2018  . Zoster 02/19/2014  . Zoster Recombinat (Shingrix) 10/13/2018, 07/25/2019    Family History  Problem Relation Age of Onset  . Liver cancer Mother   . Diabetes Sister   . Lung cancer Sister   .  Heart disease Brother   . Diabetes Brother   . Colon cancer Brother   . Diabetes Other   .  Cancer Other        prostate  . Colon polyps Neg Hx   . Esophageal cancer Neg Hx   . Rectal cancer Neg Hx   . Stomach cancer Neg Hx      Current Outpatient Medications:  .  chlorpheniramine-HYDROcodone (TUSSIONEX PENNKINETIC ER) 10-8 MG/5ML SUER, Take 5 mLs by mouth 2 (two) times daily as needed for cough (Do not take with other sedating medication)., Disp: 140 mL, Rfl: 0 .  diphenhydrAMINE (BENADRYL) 25 MG tablet, Take 25 mg by mouth every 6 (six) hours as needed for allergies or sleep. Patient reports taking at bedtime, Disp: , Rfl:  .  flecainide (TAMBOCOR) 50 MG tablet, TAKE 1 TABLET BY MOUTH THREE TIMES A DAY, Disp: 270 tablet, Rfl: 3 .  fluticasone (FLONASE) 50 MCG/ACT nasal spray, Place 2 sprays into both nostrils daily., Disp: 48 g, Rfl: 5 .  Nintedanib (OFEV) 150 MG CAPS, Take 1 capsule (150 mg total) by mouth 2 (two) times daily., Disp: 60 capsule, Rfl: 11 .  pantoprazole (PROTONIX) 40 MG tablet, Take 1 tablet (40 mg total) by mouth 2 (two) times daily., Disp: 180 tablet, Rfl: 3 .  predniSONE (DELTASONE) 5 MG tablet, TAKE 1 TABLET BY MOUTH EVERY DAY WITH BREAKFAST, Disp: 30 tablet, Rfl: 2 .  zolpidem (AMBIEN) 5 MG tablet, TAKE 1 TABLET BY MOUTH AT BEDTIME AS NEEDED FOR SLEEP., Disp: 30 tablet, Rfl: 5  Current Facility-Administered Medications:  .  0.9 %  sodium chloride infusion, 500 mL, Intravenous, Continuous, Danis, Estill Cotta III, MD      Objective:   Vitals:   04/11/20 1128  BP: 120/64  Pulse: 61  Temp: 98.1 F (36.7 C)  TempSrc: Temporal  SpO2: 95%  Weight: 166 lb (75.3 kg)  Height: 5' 7.5" (1.715 m)    Estimated body mass index is 25.62 kg/m as calculated from the following:   Height as of this encounter: 5' 7.5" (1.715 m).   Weight as of this encounter: 166 lb (75.3 kg).  _0 @  Filed Weights   04/11/20 1128  Weight: 166 lb (75.3 kg)     Physical Exam Alert and oriented x3.  Normal oral cavity.  Missing finger.  Bilateral bibasal crackles  Velcro crackles with a craniocaudal gradient.  Mostly at the base lower one third.  Or maybe even lower one fourth.  Patient himself feels the crackles are worse.  Abdomen soft.  Mild clubbing present.  No edema.  Skin looks fine.  Except there is bruising in the left forearm from the steroids.        Assessment:       ICD-10-CM   1. IPF (idiopathic pulmonary fibrosis) (HCC)  J84.112 Hepatic function panel    Lipase    Amylase    Pulmonary function test  2. Chronic cough  R05   3. Encounter for therapeutic drug monitoring  Z51.81 Hepatic function panel    Lipase    Amylase  4. Elevated amylase  R74.8 Amylase       Plan:     Patient Instructions     ICD-10-CM   1. IPF (idiopathic pulmonary fibrosis) ([place])  J84.112   2. Chronic cough  R05   3. Encounter for therapeutic drug monitoring  Z51.81   4. Elevated amylase  R74.8     Shortness of breath roughly the same compared  to last year but may be a bit worse compared to June 2020 in terms of your symptom severity  On breathing tests lung function test is steady in the last 1 year but definitely over the last several years it is slow and steadily worse  His subjective feeling of shortness of breath worsening might be due to pulmonary fibrosis or strong heart rate medication -regardless it is only a mild difference.  We do need to keep an eye  I think the nintedanib is working well for you -you have been on this now for 6 years  I think the prednisone low-dose is also helping her cough but is causing skin issues  Last months nausea might be due to something else as opposed to nintedanib  Plan -Check liver function test and amylase and lipase today -Continue nintedanib 150 mg twice daily with food -glad you are tolerating it well -Continue prednisone 5 mg/day -Continue to monitor your symptoms -if they are getting worse come sooner  -In 3 to 4 months to spirometry and DLCO -In the future we can consider you for clinical  trial -Hold off on doing repeat CT chest for the moment [last 14 January 2019) but if shortness of breath getting worse then we will do it  Follow-up -3 to 4 months and 30-minute slot preferably ILD clinic or any other day with [name] but after spirometry and [facility]       SIGNATURE    Dr. Brand Males, M.D., F.C.C.P,  Pulmonary and Critical Care Medicine Staff Physician, [place] Director - Interstitial Lung Disease  Program  [facility] [place] [facility] [place], [place], [address]  Pager: [phone], If no answer or between  15:00h - 7:00h: call [phone] Telephone: [phone]  12:44 PM 04/11/2020

## 2020-04-12 ENCOUNTER — Ambulatory Visit: Payer: PPO | Admitting: Gastroenterology

## 2020-04-20 ENCOUNTER — Other Ambulatory Visit: Payer: Self-pay | Admitting: Internal Medicine

## 2020-05-10 ENCOUNTER — Other Ambulatory Visit: Payer: Self-pay | Admitting: Internal Medicine

## 2020-05-13 ENCOUNTER — Other Ambulatory Visit: Payer: Self-pay | Admitting: Family Medicine

## 2020-05-21 ENCOUNTER — Other Ambulatory Visit: Payer: Self-pay

## 2020-05-22 ENCOUNTER — Encounter: Payer: Self-pay | Admitting: Family Medicine

## 2020-05-22 ENCOUNTER — Ambulatory Visit (INDEPENDENT_AMBULATORY_CARE_PROVIDER_SITE_OTHER): Payer: PPO | Admitting: Family Medicine

## 2020-05-22 VITALS — BP 110/50 | HR 63 | Temp 97.2°F | Wt 169.2 lb

## 2020-05-22 DIAGNOSIS — G8929 Other chronic pain: Secondary | ICD-10-CM | POA: Diagnosis not present

## 2020-05-22 DIAGNOSIS — M5442 Lumbago with sciatica, left side: Secondary | ICD-10-CM

## 2020-05-22 DIAGNOSIS — M810 Age-related osteoporosis without current pathological fracture: Secondary | ICD-10-CM

## 2020-05-22 MED ORDER — DICLOFENAC SODIUM 75 MG PO TBEC: 75.0000 mg | DELAYED_RELEASE_TABLET | Freq: Two times a day (BID) | ORAL | 2 refills | Status: DC

## 2020-05-22 NOTE — Progress Notes (Signed)
   Subjective:    Patient ID: Tanner Bishop, male    DOB: February 04, 1946, 74 y.o.   MRN: [id number]  HPI Here for worsening low back pain. We saw him in 2018 for low back pain and we sent him for an MRI on 04-07-17. 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 His pain improved for a few years, but now over the past few months the pain has gotten worse. The pain now also radiates down the back of the left leg to the foot and the left foot has numbness and tingling. No weakness. He is on Gabapentin but has taken nothing else for the pain.    Review of Systems  Constitutional: Negative.   Respiratory: Negative.   Cardiovascular: Negative.   Musculoskeletal: Positive for back pain.       Objective:   Physical Exam Constitutional:      General: He is not in acute distress.    Appearance: Normal appearance.  Cardiovascular:     Rate and Rhythm: Normal rate and regular rhythm.     Pulses: Normal pulses.     Heart sounds: Normal heart sounds.  Pulmonary:     Effort: Pulmonary effort is normal.     Breath sounds: Normal breath sounds.  Musculoskeletal:     Comments: He is tender over the lower spine and over the left sciatic notch. ROM is full but SLR is positive on the left   Neurological:     Mental Status: He is alert.           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 We will set up another MRI scan. He will try Diclofenac BID.  Alysia Penna, MD

## 2020-06-07 ENCOUNTER — Other Ambulatory Visit (HOSPITAL_COMMUNITY): Payer: PPO

## 2020-06-11 ENCOUNTER — Ambulatory Visit (INDEPENDENT_AMBULATORY_CARE_PROVIDER_SITE_OTHER): Payer: PPO | Admitting: Internal Medicine

## 2020-06-11 ENCOUNTER — Ambulatory Visit: Payer: PPO | Admitting: Internal Medicine

## 2020-06-11 ENCOUNTER — Telehealth: Payer: Self-pay | Admitting: Pharmacy Technician

## 2020-06-11 ENCOUNTER — Other Ambulatory Visit: Payer: PPO

## 2020-06-11 ENCOUNTER — Other Ambulatory Visit: Payer: Self-pay

## 2020-06-11 ENCOUNTER — Encounter: Payer: Self-pay | Admitting: Internal Medicine

## 2020-06-11 VITALS — BP 120/70 | HR 54 | Ht 67.5 in | Wt 164.4 lb

## 2020-06-11 DIAGNOSIS — Z598 Other problems related to housing and economic circumstances: Secondary | ICD-10-CM

## 2020-06-11 DIAGNOSIS — R05 Cough: Secondary | ICD-10-CM

## 2020-06-11 DIAGNOSIS — J84112 Idiopathic pulmonary fibrosis: Secondary | ICD-10-CM | POA: Diagnosis not present

## 2020-06-11 DIAGNOSIS — R748 Abnormal levels of other serum enzymes: Secondary | ICD-10-CM | POA: Diagnosis not present

## 2020-06-11 DIAGNOSIS — Z5181 Encounter for therapeutic drug level monitoring: Secondary | ICD-10-CM

## 2020-06-11 DIAGNOSIS — R053 Chronic cough: Secondary | ICD-10-CM

## 2020-06-11 DIAGNOSIS — Z599 Problem related to housing and economic circumstances, unspecified: Secondary | ICD-10-CM

## 2020-06-11 LAB — PULMONARY FUNCTION TEST
DL/VA % pred: 96 %
DL/VA: 3.91 ml/min/mmHg/L
DLCO cor % pred: 58 %
DLCO cor: 13.69 ml/min/mmHg
DLCO unc % pred: 58 %
DLCO unc: 13.69 ml/min/mmHg
FEF 25-75 Pre: 3.2 L/sec
FEF2575-%Pred-Pre: 152 %
FEV1-%Pred-Pre: 70 %
FEV1-Pre: 2 L
FEV1FVC-%Pred-Pre: 121 %
FEV6-%Pred-Pre: 61 %
FEV6-Pre: 2.26 L
FEV6FVC-%Pred-Pre: 106 %
FVC-%Pred-Pre: 57 %
FVC-Pre: 2.26 L
Pre FEV1/FVC ratio: 88 %
Pre FEV6/FVC Ratio: 100 %

## 2020-06-11 LAB — HEPATIC FUNCTION PANEL
ALT: 12 U/L (ref 0–53)
AST: 21 U/L (ref 0–37)
Albumin: 4.2 g/dL (ref 3.5–5.2)
Alkaline Phosphatase: 48 U/L (ref 39–117)
Bilirubin, Direct: 0.1 mg/dL (ref 0.0–0.3)
Total Bilirubin: 0.5 mg/dL (ref 0.2–1.2)
Total Protein: 7.3 g/dL (ref 6.0–8.3)

## 2020-06-11 LAB — LIPASE: Lipase: 13 U/L (ref 11.0–59.0)

## 2020-06-11 LAB — AMYLASE: Amylase: 347 U/L — ABNORMAL HIGH (ref 27–131)

## 2020-06-11 NOTE — Progress Notes (Signed)
#IPF - uip path 03/22/13  - On OFEV since early May 2015   PFT FVC fev1 ratio BD fev1 TLC DLCO Walk test 177f x 3 laps wt rx             Spring 2014 2.9:L L/% /%        June 2015 2.7L/67% 2.34L/78% 86  3.68/57% 20.24/71%   ofev start  [name]  2016 2.5L/55% 2.1L/60% 84/108%    No desat, Pk HR 130    02/11/2015 Screening visit afferent cough study 2.59L/56% 2.24L/63% 87/112%    Dx with afluttter on ekg   Screen failed due to hx of stones  03/26/2015        No desat, PK HR 130    08/26/15 pft machine 2.73L/68% 2.38L/80% 87   [name]     06/29/2016 Office spiro 2.49L/56% 2.14L/65%     Pk HR 90 and lowest pulse ox 99% ->93%  ofev  11/02/2016  office full pft  2.61L/66% 2.3L/79%    17.23/60%   pfev  09/16/2017  2.53L/64%      Pulse ox 99% -> 93%, HR 81- > 84  ofev  12/22/2017        100% -> 97%,  HR 66 -> 96    09/19/2018        100% -> 93, HR 74 -> 85         OV 03/26/2015  Chief Complaint  Patient presents with  . Follow-up    [name], dry cough. CATH procedure on 04/08/15. Denies any chest tightnes/congestion.    74year old male with idiopathic pulmonary fibrosis. He is on Ofev after having failed Esbriet in the past due to side effects. He is tolerating Ofev well. Liver function test in March 2016 was normal. I last saw him in January 2016. Subsequently in February 2016 we screened him for a cough idiopathic pulmonary fibrosis study called afferent but he screen failed due to history of renal stones. At this time he was incidentally diagnosed with new onset atrial flutter. He has seen Dr. GCrissie Sicklesand has been started on anticoagulation with Xarelto and metoprolol. He is due to undergo a ablation. He is not aware of the increased bleeding risk with Ofev in the setting of anticoagulation. He assures me that the anti-coag and is only until he undergoes ablation and after that the plan is to stop it. Therefore only short-term anticoagulation with Xarelto.  Overall his effort tolerance is fine. He has postponed his [facility] lung transplant until he completes his ablation.  Walking desaturation test in the office today he did not desaturate. This is stable. Spirometry not done   Past medical history: Atrial flutter new diagnoses as above   OV 06/29/2016  Chief Complaint  Patient presents with  . Follow-up    pt states he is at baseline: sob with exertion, nonprod cough.      74year old male with idiopathic pulmonary fibrosis. He is on Ofev . He completed 6 months of PRAISE study (([id number] placebo) in l;ate winter/early spring 2017. This is [place]. Overall stable. No worsening dyspnea and cough. Continues daily exercise is walking many miles. Some days he is sitting more than the others. He is interested in more trials and results of the praise study. These results are not out yet. He is compliant with his Ofe last liver function test was in February 2017. There are no new issues. He believes his atrial fibrillation is in  sinus rhythm;    OV  11/02/2016  Chief Complaint  Patient presents with  . Follow-up    4 month IPF follow up and B&A review - does report increased prod cough with yellow mucus, wheezing, tightness in chest, increased SOB, x3-4 weeks with the weather change.  denies any f/c/s, hemoptysis, chest pain   IPF - on ofev. Last liver function test was in July 2017 and normal. Overall dyspnea stable. However he is been having diarrhea with ofev. He called in and we reduced it to 1 tablet a day and this improved the diarrhea. He back on 1 tablet twice a day. The diarrhea has returned although it is much milder. He has never taken any medication for this. Are function test shows slight improvement from July 2017 and similar levels to approximately one year ago  The new issue that he's having significant recurrent sinus infection in the back of chronic postnasal drainage. This since the fall 2017. He says that he and his wife catch  respirator infection and passive back and forth to each other. He is having cough, chest tightness, postnasal drip and yellow sputum. It is not affecting any dyspnea. There is no wheeze. There is no fever or hemoptysis.  OV 03/02/2017  Chief Complaint  Patient presents with  . Follow-up    Pt states his breathing is at baseline. Pt c/o dry cough - pt states this is baseline. Pt denies CP/tightness.     Idiopathic pulmonary fibrosis on Ofev. Last seen November 2017. He is a routine follow-up.  He is here with his wife. His sinus infections a result. He is interested in research protocols but we have none right now. He is tolerating his Ofev associated with some mild diarrhea occasional which she takes medication. He is not much of a problem. He tells me that she walks 6 times a week for 5 miles a day. On the treadmill at a 5 incline walking at 3-1/2 miles an hour. For this he feels a little more dyspneic than before. Walking desaturation test 185 feet 3 laps on room air: His pulse ox dropped from 98% at rest and 93% with exertion. His heart rate jumped from 66 at rest and 97 at peak exertion. Features are suggestive of mild progression of the disease. He is not attending pulmonary fibrosis foundation because he felt this was negative but that was years ago. We discussed this and he might be interested again.     OV 09/16/2017  Chief Complaint  Patient presents with  . Follow-up    PFT done today. Pt states that his breathing is gradually getting worse. SOB which depends if it is on exertion vs all the time and c/o prod cough with yellow mucus. Denies any CP   S IPF on fev followup. Overall doing well. He recent bronchitis and liked anabiotic and prednisone. He felt the prednisone helped his cough just currently rated as mild to moderate in severity. Helped the shortness of breath. He also helped arthralgia. He is now wondering what taking chronic prednisone. We had an extensive discussion  about this. Spirometry shows that the FVC stable compared to earlier this year but slightly progressive compared to one year ago. [facility] interstitial lung disease questionnaire shows symptoms. His wife is here with him. He is interested in research trials. He has participated in distress before. He is asking about opioid refill for cough if I wont do prednisone.   OV 12/22/2017  Chief Complaint  Patient presents  with  . Follow-up    Pt states he believes his breathing is at a stable point right now. States that he is coughing which is worse when he first wakes up and then also at night; occ will cough up yellow phlegm.     C.o ofev intolerance with fatigue, weight loss, diarrhea. Does not like lomotil. Wnaant to give it a holiday foir 2-3 weeks. INterested in research protocol - discussed [place]. 5 year cut off since diagnosis coming up 03/22/18. He is overall frustrated ith qualtiy of life. Walks 5 miles; starts feeling dizzy > 3 miles in but not checking pulse ox despite advice. Walking desaturation test on 12/22/2017 185 feet x 3 laps on ROOM AIR:  did noit desaturate. Rest pulse ox was 100%, final pulse ox was 97%. HR response 66/min at rest to 96/min at peak exertion. Patient Tanner Bishop  Did not Desaturate < 88% . Maison Dejoy yes  Desaturated </= 3% points. Sho Chai yes did get tachyardic     Walking desaturation test on 02/01/2018 185 feet x 3 laps on ROOM AIR:  did not desaturate. Rest pulse ox was 100%, final pulse ox was 91%. HR response 67/min at rest to 85/min at peak exertion. Patient Tanner Bishop  dod not Desaturate < 88% . Secundino Herskowitz yes diod  Desaturated </= 3% points. Sabastion Placencia did not get tachyardic   OV 02/01/2018  Chief Complaint  Patient presents with  . Follow-up    Pt states he has been doing good since last visit. Started back on OFEV 12/24/17 after taking a holiday off of it.     FU IPF  Took ofev holiday. And then restarted 01/24/18 and doing well.  [place] ofev ok. KBILD ok.   OV 03/16/2018'  . Chief Complaint  Patient presents with  . Follow-up    PFT done today. States he has been doing well and denies any current complaints. Tolerating OFEV well and has been walking about 5 miles every day with no complaints.   Ms. Hoos returns for follow-up of idiopathic pulmonary fibrosis.  He is now back on nintedanib after giving it a holiday for a few weeks.  He is tolerating it well and no problems.  He goes for daily walks and has no problems.  His lung function was reviewed today and it shows for the first time a significant decline of greater than 200 cc between 2 time points.  This correlates with him not taking over for a few weeks nevertheless overall he is happy with his health.  He is not interested in research trials anymore.  He has a new question about travel advice encounter.  He plans an [place] from [place], [place] intrajejunal in [place] for 10 days.  This will start on May 22, 2018.  He wants to make sure it is safe for him to go.  He said he has never been on a cruise and he and his wife have a strong desire to go as part of his goals of care.   OV 05/16/2018  Chief Complaint  Patient presents with  . Follow-up    Breathing is unchanged since last OV.    Tanner Bishop , 74 y.o. , with dob [birth year] and male ,Not Hispanic or Latino from [address] [place] [address] - presents to pulm ILD clinic for IPF.  He presents with his wife.  And just under 7 days he is going to embark on a [place] cruise.  This visit is precautionary visit just before that.  Most recent visit was in April 2019.  At this point in time in terms of his IPF he feels stable.  He is compliant with full dose of 5 and is having only mild diarrhea.  Other than that he is tolerating it just fine.  He is looking forward to his cruise.  He had pulmonary function test today that shows an improvement compared to last visit and it  is very similar to summer/fall 2018 in terms of FVC and DLCO.  Although he does feel like he is coming down with an acute bronchitis and wants to take some antibiotic and prednisone I had of the trip.  We also discussed about him having prophylactic antibiotics handy.  He wants a refill on his Tussionex for cough.  At this point in time he is past 5 years of diagnosis and not eligible for research trials he is not interested in transplant although the recent pulmonary fibrosis foundation meeting he did hear from a lot of transplant patients about the individual personal experience.       OV 09/19/2018  Subjective:  Patient ID: Tanner Bishop, male , DOB: Jul 06, 1946 , age 51 y.o. , MRN: [id number] , ADDRESS: [place] [id number]   09/19/2018 -   Chief Complaint  Patient presents with  . Follow-up    Pt saw cards 08/23/18. States he is still having problems with his heart going in and out of rhythm and states he has had some episodes to where he almost passes out. States his breathing is at a stable point. States he also has  an occ cough.     HPI Tanner Bishop 74 y.o. -presents 5-year follow-up.  I personally saw him in June 2019.  Then approximately a month ago he presented and saw acutely my nurse practitioner because of dizziness.  It was felt to be A. fib RVR.  He subsequently saw Dr. Crissie Sickles his electrophysiologist.  I reviewed the note.  Flecainide was started.  He says despite that l approximately 10 weeks ago he had another episode of dizziness while getting out of the shower.  He was tachycardic with a heart rate of 140s.  He felt he might pass out but there is no focal neurologic deficits or abnormal sensation.  His pulse ox was normal at 97% at that time.  It happened at rest.  He says he has been walking on the treadmill for several miles and he does not desaturate.  He says his pulse ox is 93% at that time.  He never drops into the 80s.  He does not think his dizziness  and palpitation issues are related to his pulmonary fibrosis.  However [name] is wondering about oxygen drops during this time.  He is not tolerating his nintedanib at full dose without any problems.  He has had a successful [place] trip.  He is participating in the patient support group.  He is up-to-date with his flu shot      OV 10/13/2018  Subjective:  Patient ID: Tanner Bishop, male , DOB: 20-Dec-1945 , age 17 y.o. , MRN: [id number] , ADDRESS: [place] [id number]   10/13/2018 -   Chief Complaint  Patient presents with  . Follow-up    review PFT.  c/o baseline sob with exertion.       HPI Tanner Bishop 74 y.o. -presents for follow-up of IPF to the ILD clinic.  This visit  is arranged because letter physiologist [name] was concerned about hypoxemia effects on his atrial fibrillation.  At this point in time patient tells me that after increasing dose of flecainide his atrial fibrillation and palpitations have improved and resolved.  Overall he feels stable.  On September 26, 2018 he had 6-minute walk test and this was pretty robust with walking 384 meters and with lowest pulse ox of 95%.  Then on October 05, 2018 he had overnight pulse oximetry that shows 12 minutes of desaturation less than 88%.  He is not interested in oxygen.  Then he called his October 07, 2018 with worsening bronchitis episodes and we gave him 5 days of prednisone and antibiotics.  He finished his last dose yesterday.  Overall he feels stable but pulmonary function test today shows decline in both FVC and DLCO.  And then when I questioned him he felt like he still had some residual bronchiti and feels another course of prednisone could help him.  There are no other new issues.  Last liver function test October 2019 and was normal.      OV 01/12/2019  Subjective:  Patient ID: Tanner Bishop, male , DOB: March 16, 1946 , age 67 y.o. , MRN: [id number] , ADDRESS: [place]  [id number]   01/12/2019 -   Chief Complaint  Patient presents with  . Follow-up    PFT performed today. Pt states he is about the same since last visit.States he still becomes SOB with exertion, has a lot of coughing but has been taking mucinex, and also has had some CP or chest tightness.     HPI Tanner Bishop 74 y.o. -returns for follow-up of his IPF.  He is now more than 5 years since diagnosis.,  April 2020 he will be 6 years since diagnosis.  He continues to exercise well on the treadmill and according to his wife he is does well.  He is not dropping oxygen at this time.  He does not have a cough when needed works on the tile.  However at other times the day especially when he is trying to lie down to bed or when he gets up in the morning and goes to the bathroom he has really bad cough.  Overall the cough severity is now 6 out of 10 in terms of how it is impacting his quality of life.  He tells me that every time he takes prednisone for the cough he feels better but then when he comes off prednisone cough gets worse.  He said multiple prednisone courses of brief duration for this chronic cough.  In addition for the last few weeks has had yellow sinus drainage and sinus headache and sinus congestion and he feels this is again making his chronic cough worse.  He is open to taking antibiotic and prednisone course.  In terms of taking nintedanib he has no side effects.  His last liver function test reviewed was in October 2019 and it was normal at that time.  Overall at this point in time he really wants good significant support for his cough in terms of affecting his quality of life.    ROS - per HPI     OV 02/16/2019  Subjective:  Patient ID: Tanner Bishop, male , DOB: [birth year] , age 43 y.o. , MRN: [id number] , ADDRESS: [address] [place] [address]   02/16/2019 -   Chief Complaint  Patient presents with  . Follow-up    HRCT and sinus  CT performed 2/18. Pt states he is about the same  as last visit and states SOB is about the same. Pt still has a dry cough. Denies any complaints of CP/chest tightness.     HPI Tanner Bishop 74 y.o. -returns for follow-up of his IPF.  He presents with his wife.  He is here to review the test results.  He had high-resolution CT chest and CT sinus.  These were done in order to reevaluate his disease because he is having significant cough.  His high-resolution CT chest shows only mild progression in his IPF in 3 years.  However there is significant new findings of tracheobronchomalacia although there is no lung cancer.  He also has two-vessel coronary artery calcification.  These are new findings.  Since last visit and a sinus episode he is stable.  He is currently run out of his nasal steroids.  He says the pharmacy said that I declined his prescription request.  I do not recollect such a conversation.  In any event he is now going to participate in the cough study called scenic.  He has received a copy of the consent form.  He is rated.  He has an appointment for his consent visit.  It is possible that the nasal steroid is on the exclusion list but I do not have the exclusion list with me.  Anyway he is not consented.  He is not having chest pain when he does his treadmill exercises.  He works out hard.  He states he gets tachycardic.  He says he has a 6 times that he will die from his heart condition before his pulmonary fibrosis.  He has upcoming appointment with his cardiologist [name] for atrial fibrillation.  ...................................................................... OV 06/08/2019   Subjective:  Patient ID: Tanner Bishop, male , DOB: October 28, 1946 , age 54 y.o. , MRN: [id number] , ADDRESS: [place] [address]   06/08/2019 -   Chief Complaint  Patient presents with  . Follow-up    1wk f/u for IPF. Patient stated that he was given a round of prednisone last week for some SOB but is feeling much better.       HPI Tanner Bishop 74 y.o. -returns for IPF follow-up.  Last seen in March 2020.  Since the pandemic started he has been social distancing.  He has not attended any support group.  He has been tolerating his Ofev quite well without any difficulty.  Recently had a bronchitis exacerbation and we gave prednisone.  This resolved his cough.  He was going to participate in a cough study but because of the pandemic the study got canceled.  He tells me that every time he takes prednisone the cough goes away.  He is very interested in taking daily prednisone for symptom relief of cough.  We discussed the side effects of prednisone that include weight gain, easy bruising, adrenal insufficiency, osteoporosis, cataracts hypertension, diabetes, immunosuppression.  Despite all this he wants to take a low-dose prednisone daily.  His last liver function test was in March 2020.  This needs to be retested because he is on nintedanib.  His symptom scores at walk test show relative stability.  His last pulmonary function test was in January 2020 but because of the pandemic is on hold.  We are using symptom and walk test to determine progression.         OV 04/11/2020  Subjective:  Patient ID: Tanner Bishop, male , DOB: 07-17-1946 , age 6  y.o. , MRN: [id number] , ADDRESS: [address] [place] [address]   04/11/2020 -   Chief Complaint  Patient presents with  . Follow-up    PFT performed today. Pt states he feels like his breathing is gradually becoming worse and states it could be at any time that he is SOB.   #IPF - uip path 03/22/13  - On OFEV since early May 2015 -On chronic daily prednisone because of cough since 2020  HPI Tanner Bishop 73 y.o. -presents with his wife IPF follow-up.  Last saw him June 2020.  After that he feels his dyspnea is worse.  He says that he could do several miles in one 1 hour and 30 minutes on the treadmill.  The same distance and now taking 1 hour and 40 minutes.  He  feels his disease is getting worse.  He says the prednisone is working well for him because of improved cough and wellbeing.  He is having some skin bruising because of that.  The nintedanib he is tolerating well except for some mild diarrhea.  On objective symptom score is actually similar to 1 year ago but may be slightly worse than 6 months ago or 9 months ago.  On pulmonary function testing he is stable compared to 1 year ago but slow and steadily he is definitely worse over the many years.  We noticed on his walking desaturation test that he did not mount a tachycardic response as he is done in the past.  He is on flecainide for atrial fibrillation.  He takes his 3 times daily.  He feels his atrial fibrillation is under good control.  There is no chest pain.  There is no weight loss.  Is no loss of physical conditioning.  He is interested in clinical trials.   A month ago he had nausea and his amylase/lipase was elevated.  He says he is fine now.  Primary care address this. ROS - per HPI  IMPRESSION: 1. 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 Findings are consistent with UIP per consensus guidelines: Diagnosis of Idiopathic Pulmonary Fibrosis: An Official ATS/ERS/JRS/ALAT Clinical Practice Guideline. [place], Iss 5, [phone], Aug 14 2017. 2. 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 3. Two-vessel coronary atherosclerosis.  Aortic Atherosclerosis (ICD10-I70.0).   Electronically Signed   By: Ilona Sorrel M.D.   On: 01/31/2019 12:24  OV 06/11/2020  Subjective:  Patient ID: Tanner Bishop, male , DOB: [birth year] , age 4 y.o. , MRN: [id number] , ADDRESS: [place] [address]   06/11/2020 -   Chief Complaint  Patient presents with  . Follow-up    PFT performed today.  Pt states he has been doing good since  last visit. States breathing is about the same.,    #IPF - uip path 03/22/13  - On OFEV since early May 2015 -On chronic daily prednisone because of cough since 2020  HPI Tanner Bishop 74 y.o. -returns to clinic for ILD follow-up.  In the interim he feels that his wife has worsening dementia.  She is able to self-care but only does minimal cooking.  She is more irritable.  His daughter Theadora Rama is now on disability because of back issues.  Patient has a son in [place] who he is close with.  Together they take care of his wife.  He says he has become the primary  caregiver now for his wife.  Nevertheless she is functional and does some ADLs.  He continues with nintedanib 150 mg twice daily.  Overall tolerance is good just mild diarrhea.  This is baseline.  Shortness of breath is baseline symptom score is 14 and similar to the past.  Pulmonary function test appears stable.  Walking desaturation test is stable.  His last liver function test was 2 months ago.  His main issue is that he is having some financial issues covering the cost of nintedanib.  He $6000 grant is running out.  He only has 1 month supply left.  He wants to meet with the pharmacist.     SYMPTOM SCALE - ILD 02/16/2019  06/08/2019  04/11/2020  06/11/2020   O2 use ra ra ra ra  Shortness of Breath 0 -> 5 scale with 5 being worst (score 6 If unable to do)     At rest 1 1 0 1  Simple tasks - showers, clothes change, eating, shaving _0 Household (dishes, doing bed, laundry) _1 Shopping _2 Walking level at own pace _3 Walking up Stairs _4 Total (40 - 48) Dyspnea Score _5 How bad is your cough? 3 Xx - due to recent pred 2 2  How bad is your fatigue 3 x 2 2  nausea   0 0  vomit   0 0  diarrhea   2 2  anxiety   0 0  deopresso   0 0       Simple office walk 185 feet x  3 laps goal with forehead probe 09/19/2018  01/12/2019  02/16/2019  06/08/2019  04/11/2020  06/11/2020   O2 used Room air  Room air Room air Room air  ra  Number laps completed _6 Comments about pace normal normal normal normal  Mod pace  Resting Pulse Ox/HR 100% and 73/min 100% ad 74/min 100% and 68/min 98% and 82/min 99% and 60/min 98% and 54  Final Pulse Ox/HR 93% and 85/min 92% and 84/min 93% and 81/min 91% and 96/mni 91% and 74/mm 90% and 71/min  Desaturated </= 88% no no no no yes yes  Desaturated <= 3% points Yes, 7 poin Yes, 8 ponts Yes, 7 points Yes, 7 points Yes, 8point Yes, 8 point  Got Tachycardic >/= 90/min no no no yes    Symptoms at end of test x none none none dyspnea none  Miscellaneous comments x x stable stable but tachy for first time       Results for Tanner, Bishop (MRN [id number]) as of 04/11/2020 12:14  Ref. Range 05/14/2014 15:46 07/24/2015 12:43 08/26/2015 10:52 11/02/2016 09:51 05/19/2017 08:41 09/16/2017 08:47 03/16/2018 08:48 05/16/2018 10:53 10/13/2018 09:52 01/12/2019 09:49 04/11/2020 10:56 06/11/2020   FVC-Pre Latest Units: L 2.71 2.69 2.73 2.61 2.51 2.53 2.29 2.38 2.25 2.21 2.23 2.26  FVC-%Pred-Pre Latest Units: % 67 67 68 66 63 64 58 60 57 56 56 57%  Results for Tanner, Bishop (MRN [id number]) as of 04/11/2020 12:14  Ref. Range 05/14/2014 15:46 07/24/2015 12:43 08/26/2015 10:52 11/02/2016 09:51 05/19/2017 08:41 09/16/2017 08:47 03/16/2018 08:48 05/16/2018 10:53 10/13/2018 09:52 01/12/2019 09:49 04/11/2020 10:56 06/11/2020   DLCO unc Latest Units: ml/min/mmHg 20.24 15.36 15.36 17.23 15.83  13.10 16.48 14.17 14.38 14.72 13.69  DLCO unc % pred Latest Units: % 71 54 54 60 55  92 58 68 50 7 58%     ROS - per HPI     has a past medical history of Allergy, Arthritis, Atrial flutter ([place]), Cataract, GERD (gastroesophageal reflux disease), H/O hiatal hernia, Hyperlipidemia, Injury of right hand, Pulmonary fibrosis ([place]), Renal stone (06/2014), and Ulcer.   reports that he quit smoking about 41 years ago. His smoking use included cigarettes. He has a 16.00 pack-year smoking history. He has never used  smokeless tobacco.  Past Surgical History:  Procedure Laterality Date  . ATRIAL FLUTTER ABLATION N/A 04/08/2015   Procedure: ATRIAL FLUTTER ABLATION;  Surgeon: Evans Lance, MD;  Location: [facility] CATH LAB;  Service: Cardiovascular;  Laterality: N/A;  . CATARACT EXTRACTION, BILATERAL  2016  . COLONOSCOPY  03/01/2017   per Dr. Loletha Carrow, clear, repeat in 5 yrs (brother had colon cancer)   . ESOPHAGOGASTRODUODENOSCOPY  2007  . EXTRACORPOREAL SHOCK WAVE LITHOTRIPSY  06-17-14   per Dr. Jeffie Pollock   . HAND SURGERY Right   . LUNG BIOPSY Left 03/22/2013   Procedure: LUNG BIOPSY;  Surgeon: Melrose Nakayama, MD;  Location: [place];  Service: Thoracic;  Laterality: Left;  . POLYPECTOMY    . UPPER GASTROINTESTINAL ENDOSCOPY    . VIDEO ASSISTED THORACOSCOPY Left 03/22/2013   Procedure: VIDEO ASSISTED THORACOSCOPY;  Surgeon: Melrose Nakayama, MD;  Location: [place];  Service: Thoracic;  Laterality: Left;    No Known Allergies  Immunization History  Administered Date(s) Administered  . Fluad Quad(high Dose 65+) 08/25/2019  . Influenza Split 09/13/2013  . Influenza, High Dose Seasonal PF 10/06/2016, 09/16/2017, 08/17/2018  . Influenza-Unspecified 09/07/2014, 09/14/2015  . PFIZER SARS-COV-2 Vaccination 02/12/2020, 03/12/2020  . Pneumococcal Conjugate-13 02/16/2017  . Pneumococcal Polysaccharide-23 05/26/2013  . Td 12/15/2007  . Tdap 02/23/2018  . Zoster 02/19/2014  . Zoster Recombinat (Shingrix) 10/13/2018, 07/25/2019    Family History  Problem Relation Age of Onset  . Liver cancer Mother   . Diabetes Sister   . Lung cancer Sister   . Heart disease Brother   . Diabetes Brother   . Colon cancer Brother   . Diabetes Other   . Cancer Other        prostate  . Colon polyps Neg Hx   . Esophageal cancer Neg Hx   . Rectal cancer Neg Hx   . Stomach cancer Neg Hx      Current Outpatient Medications:  .  chlorpheniramine-HYDROcodone (TUSSIONEX PENNKINETIC ER) 10-8 MG/5ML SUER, Take 5 mLs by mouth 2  (two) times daily as needed for cough (Do not take with other sedating medication)., Disp: 140 mL, Rfl: 0 .  diphenhydrAMINE (BENADRYL) 25 MG tablet, Take 25 mg by mouth every 6 (six) hours as needed for allergies or sleep. Patient reports taking at bedtime, Disp: , Rfl:  .  flecainide (TAMBOCOR) 50 MG tablet, TAKE 1 TABLET BY MOUTH THREE TIMES A DAY, Disp: 270 tablet, Rfl: 3 .  fluticasone (FLONASE) 50 MCG/ACT nasal spray, SPRAY 2 SPRAYS INTO EACH NOSTRIL EVERY DAY, Disp: 48 mL, Rfl: 5 .  gabapentin (NEURONTIN) 100 MG capsule, TAKE 1 CAPSULE BY MOUTH THREE TIMES A DAY, Disp: 60 capsule, Rfl: 5 .  Nintedanib (OFEV) 150 MG CAPS, Take 1 capsule (150 mg total) by mouth 2 (two) times daily., Disp: 60 capsule, Rfl: 11 .  pantoprazole (PROTONIX) 40 MG tablet, Take 1 tablet (40 mg total) by mouth 2 (two) times daily., Disp: 180 tablet, Rfl: 3 .  predniSONE (DELTASONE) 5 MG tablet, TAKE 1 TABLET BY  MOUTH EVERY DAY WITH BREAKFAST, Disp: 30 tablet, Rfl: 2 .  zolpidem (AMBIEN) 5 MG tablet, TAKE 1 TABLET BY MOUTH AT BEDTIME AS NEEDED FOR SLEEP., Disp: 30 tablet, Rfl: 5  Current Facility-Administered Medications:  .  0.9 %  sodium chloride infusion, 500 mL, Intravenous, Continuous, Danis, Estill Cotta III, MD      Objective:   Vitals:   06/11/20 0928  BP: 120/70  Pulse: (!) 54  SpO2: 96%  Weight: 164 lb 6.4 oz (74.6 kg)  Height: 5' 7.5" (1.715 m)    Estimated body mass index is 25.37 kg/m as calculated from the following:   Height as of this encounter: 5' 7.5" (1.715 m).   Weight as of this encounter: 164 lb 6.4 oz (74.6 kg).  _0 @  Filed Weights   06/11/20 0928  Weight: 164 lb 6.4 oz (74.6 kg)     Physical Exam Pleasant male well-built.  No elevated neck nodes or JVP.  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  Bilateral bibasal crackles present.  Normal heart sounds.  Alert and oriented x3.         Assessment:       ICD-10-CM   1. IPF (idiopathic pulmonary fibrosis) (HCC)  J84.112  Hepatic function panel    Amylase    Lipase    Pulmonary function test  2. Chronic cough  R05   3. Encounter for therapeutic drug monitoring  Z51.81 Hepatic function panel    Amylase    Lipase  4. Elevated amylase  R74.8   5. Financial difficulties  Z59.8     100 mg strength of nintedanib donor sample with expiration of October 2022 and lot #[id number] IN #[id number] given to him.  Another bottle from the same donor with the same lot number and expiration also given  Plan:     Patient Instructions     ICD-10-CM   1. IPF (idiopathic pulmonary fibrosis) ([place])  J84.112   2. Chronic cough  R05   3. Encounter for therapeutic drug monitoring  Z51.81   4. Elevated amylase  R74.8   5. Financial difficulties  Z59.8     Idiopathic pulmonary fibrosis: Is stable Chronic cough: Is stable on prednisone low-dose Understand that financial issues are present with nintedanib  Plan -Continue nintedanib 150 mg twice daily  -Take donor sample 100 mg but take it 3 times daily  -This should tide you over till you meet with pharmacist and get financial barriers removed  -Check liver function test, amylase and lipase today  -We will consider you for pulmonary fibrosis trials when they arise  -Meet with pharmacist to help with financial aid for nintedanib  Follow-up -Spirometry and DLCO in 6 months -Return to see [name] in 6 months for 30-minute visit     SIGNATURE    Dr. Brand Males, M.D., F.C.C.P,  Pulmonary and Critical Care Medicine Staff Physician, [place] Director - Interstitial Lung Disease  Program  [facility] [place], [place], [address]  Pager: [phone], If no answer or between  15:00h - 7:00h: call [phone] Telephone: [phone]  10:25 AM 06/11/2020

## 2020-06-11 NOTE — Patient Instructions (Addendum)
ICD-10-CM   1. IPF (idiopathic pulmonary fibrosis) ([place])  J84.112   2. Chronic cough  R05   3. Encounter for therapeutic drug monitoring  Z51.81   4. Elevated amylase  R74.8   5. Financial difficulties  Z59.8     Idiopathic pulmonary fibrosis: Is stable Chronic cough: Is stable on prednisone low-dose Understand that financial issues are present with nintedanib  Plan -Continue nintedanib 150 mg twice daily  -Take donor sample 100 mg but take it 3 times daily  -This should tide you over till you meet with pharmacist and get financial barriers removed  -Check liver function test, amylase and lipase today  -We will consider you for pulmonary fibrosis trials when they arise  -Meet with pharmacist to help with financial aid for nintedanib  Follow-up -Spirometry and DLCO in 6 months -Return to see [name] in 6 months for 30-minute visit

## 2020-06-11 NOTE — Progress Notes (Signed)
Spirometry and Dlco done today. 

## 2020-06-11 NOTE — Telephone Encounter (Signed)
Patient had appointment at the office today and voiced that his grant was expiring or funding was no longer available. Patient provided info needed to apply for a new grant.  Patient was approved for $9,000 PF grant for copay assistance through [employer]. Cover is valid through 06/11/21.  Cardholder: [id number] BIN: [id number] PCN: [id number] Group: [id number]  Called [facility] and provided information. Nothing further is needed. Canceled pharmacy team televisit.

## 2020-06-11 NOTE — Addendum Note (Signed)
Addended by: Suzzanne Cloud E on: 06/11/2020 10:35 AM   Modules accepted: Orders

## 2020-06-13 ENCOUNTER — Telehealth: Payer: Self-pay | Admitting: Family Medicine

## 2020-06-13 NOTE — Telephone Encounter (Signed)
Left message for patient to schedule Annual Wellness Visit.  Please schedule with Nurse Health Advisor Charlott Rakes, RN at [facility]

## 2020-06-25 ENCOUNTER — Other Ambulatory Visit: Payer: Self-pay

## 2020-06-25 ENCOUNTER — Ambulatory Visit
Admission: RE | Admit: 2020-06-25 | Discharge: 2020-06-25 | Disposition: A | Discharge status: Home / Self Care | Payer: PPO | Source: Ambulatory Visit | Attending: Family Medicine | Admitting: Family Medicine

## 2020-06-25 DIAGNOSIS — M4856XA Collapsed vertebra, not elsewhere classified, lumbar region, initial encounter for fracture: Secondary | ICD-10-CM | POA: Diagnosis not present

## 2020-06-25 DIAGNOSIS — G8929 Other chronic pain: Secondary | ICD-10-CM

## 2020-06-25 DIAGNOSIS — M5116 Intervertebral disc disorders with radiculopathy, lumbar region: Secondary | ICD-10-CM | POA: Diagnosis not present

## 2020-06-25 DIAGNOSIS — M40209 Unspecified kyphosis, site unspecified: Secondary | ICD-10-CM | POA: Diagnosis not present

## 2020-06-25 DIAGNOSIS — M4726 Other spondylosis with radiculopathy, lumbar region: Secondary | ICD-10-CM | POA: Diagnosis not present

## 2020-07-02 ENCOUNTER — Telehealth: Payer: Self-pay | Admitting: Family Medicine

## 2020-07-02 NOTE — Addendum Note (Signed)
Addended by: Alysia Penna A on: 07/02/2020 01:06 PM   Modules accepted: Orders

## 2020-07-02 NOTE — Telephone Encounter (Signed)
Referral for radiology was placed on 05/22/2020. This referral has been closed. Will reach out to [facility] for assistance.

## 2020-07-02 NOTE — Telephone Encounter (Signed)
Pt is calling regarding a referral that he had talked to you about. Pt says, he was told to call if he didn't hear from our office. I don't see a referral. Thanks

## 2020-07-03 NOTE — Telephone Encounter (Signed)
Please advise. [place] for bone density order?

## 2020-07-03 NOTE — Telephone Encounter (Signed)
Left a detailed message on verified voice mail informing the patient that his bone density test has been ordered.

## 2020-07-03 NOTE — Telephone Encounter (Signed)
Pt stated he was told he would need a Bone density test and would like to go to [facility] location   Pt can be reached at [phone]

## 2020-07-03 NOTE — Telephone Encounter (Signed)
I already did this yesterday

## 2020-07-04 ENCOUNTER — Telehealth: Payer: Self-pay | Admitting: Family Medicine

## 2020-07-04 NOTE — Telephone Encounter (Signed)
Pt is returning a call from lindsey he misses last week

## 2020-07-08 NOTE — Telephone Encounter (Signed)
I left a detailed message at the pts home number stating the order was entered for the bone density on 7/20 and someone will call with appt info.  Message forwarded to Dr Barbie Banner scheduler.

## 2020-07-08 NOTE — Telephone Encounter (Signed)
Called lmom for pt to call the office to get scheduled for a bone density.

## 2020-07-08 NOTE — Telephone Encounter (Signed)
Pt requested a bone density test. Was calling to let the patient know that we have placed the order.

## 2020-07-09 ENCOUNTER — Ambulatory Visit (INDEPENDENT_AMBULATORY_CARE_PROVIDER_SITE_OTHER)
Admission: RE | Admit: 2020-07-09 | Discharge: 2020-07-09 | Disposition: A | Discharge status: Home / Self Care | Payer: PPO | Source: Ambulatory Visit

## 2020-07-09 ENCOUNTER — Other Ambulatory Visit: Payer: Self-pay

## 2020-07-09 DIAGNOSIS — M810 Age-related osteoporosis without current pathological fracture: Secondary | ICD-10-CM | POA: Diagnosis not present

## 2020-07-10 DIAGNOSIS — M5442 Lumbago with sciatica, left side: Secondary | ICD-10-CM | POA: Diagnosis not present

## 2020-07-10 DIAGNOSIS — R030 Elevated blood-pressure reading, without diagnosis of hypertension: Secondary | ICD-10-CM | POA: Diagnosis not present

## 2020-07-10 DIAGNOSIS — Z6825 Body mass index (BMI) 25.0-25.9, adult: Secondary | ICD-10-CM | POA: Diagnosis not present

## 2020-07-28 NOTE — Progress Notes (Signed)
Blood work normal but amylase cointnues t be high x 5 months. Not sure why

## 2020-07-29 ENCOUNTER — Telehealth: Payer: Self-pay | Admitting: Internal Medicine

## 2020-07-29 NOTE — Telephone Encounter (Signed)
Tanner Males, MD  [name], [name], CMA Blood work normal but amylase cointnues t be high x 5 months. Not sure why    Attempted to call pt but unable to reach. Left message for him to return call.

## 2020-07-30 ENCOUNTER — Other Ambulatory Visit: Payer: Self-pay | Admitting: *Deleted

## 2020-07-30 MED ORDER — PREDNISONE 5 MG PO TABS: ORAL_TABLET | ORAL | 5 refills | Status: DC

## 2020-07-31 NOTE — Telephone Encounter (Signed)
Called and spoke to pt. Informed him of the results per MR. Pt verbalized understanding but is wondering if there is anything pt needs to do to help correct this.   Will forward to [name] for recs. Thanks.

## 2020-08-01 NOTE — Telephone Encounter (Signed)
Needs to talk to PCP [name], MD

## 2020-08-02 NOTE — Telephone Encounter (Signed)
Pt returned call. Spoke with pt letting him know the info stated by MR about recommendations for labwork and he verbalized understanding stating that he will call PCP. Nothing further needed.

## 2020-08-02 NOTE — Telephone Encounter (Signed)
Attempted to call pt but unable to reach. Left message for him to return call.

## 2020-08-05 ENCOUNTER — Ambulatory Visit (INDEPENDENT_AMBULATORY_CARE_PROVIDER_SITE_OTHER): Payer: PPO | Admitting: Family Medicine

## 2020-08-05 ENCOUNTER — Other Ambulatory Visit: Payer: Self-pay

## 2020-08-05 ENCOUNTER — Other Ambulatory Visit: Payer: Self-pay | Admitting: Family Medicine

## 2020-08-05 ENCOUNTER — Encounter: Payer: Self-pay | Admitting: Family Medicine

## 2020-08-05 VITALS — BP 118/70 | HR 68 | Temp 98.1°F | Wt 163.8 lb

## 2020-08-05 DIAGNOSIS — J8410 Pulmonary fibrosis, unspecified: Secondary | ICD-10-CM | POA: Diagnosis not present

## 2020-08-05 DIAGNOSIS — R748 Abnormal levels of other serum enzymes: Secondary | ICD-10-CM

## 2020-08-05 DIAGNOSIS — R112 Nausea with vomiting, unspecified: Secondary | ICD-10-CM

## 2020-08-05 NOTE — Progress Notes (Signed)
   Subjective:    Patient ID: Tanner Bishop, male    DOB: 1946-04-10, 74 y.o.   MRN: [id number]  HPI Here at the request of the Pulmonary clinic to address a persistently elevated amylase level. [name] has been following the amylase, lipase and hepatic enzymes since starting Tanner Bishop on Ofev. Earlier this year Tanner Bishop had a lot of nausea with some vomiting, but lately most of this has resolved. His appetite is good and his weight is stable. He denies any abdominal pain, no jaundice. His liver enzymes and lipase have normal, but the amylase remains in the 300's.    Review of Systems  Constitutional: Negative.   Respiratory: Negative.   Gastrointestinal: Positive for nausea and vomiting. Negative for abdominal distention, abdominal pain, anal bleeding, blood in stool, constipation, diarrhea and rectal pain.  Genitourinary: Negative.        Objective:   Physical Exam Constitutional:      Appearance: Normal appearance. He is not ill-appearing.  Eyes:     General: No scleral icterus. Cardiovascular:     Rate and Rhythm: Normal rate and regular rhythm.     Pulses: Normal pulses.     Heart sounds: Normal heart sounds.  Pulmonary:     Effort: Pulmonary effort is normal.     Breath sounds: Normal breath sounds.  Abdominal:     General: Abdomen is flat. Bowel sounds are normal. There is no distension.     Palpations: Abdomen is soft. There is no mass.     Tenderness: There is no abdominal tenderness. There is no guarding or rebound.     Hernia: No hernia is present.  Neurological:     Mental Status: He is alert.           Assessment & Plan:  He is here for elevated amylase levels. My first thought is that these are the results of an Ofev side effect. We will check more levels today. However we will evaluate the pancreas more closely with a contrasted CT scan soon. Alysia Penna, MD

## 2020-08-06 LAB — HEPATIC FUNCTION PANEL
AG Ratio: 1.6 (calc) (ref 1.0–2.5)
ALT: 9 U/L (ref 9–46)
AST: 16 U/L (ref 10–35)
Albumin: 3.9 g/dL (ref 3.6–5.1)
Alkaline phosphatase (APISO): 40 U/L (ref 35–144)
Bilirubin, Direct: 0.1 mg/dL (ref 0.0–0.2)
Globulin: 2.4 g/dL (calc) (ref 1.9–3.7)
Indirect Bilirubin: 0.3 mg/dL (calc) (ref 0.2–1.2)
Total Bilirubin: 0.4 mg/dL (ref 0.2–1.2)
Total Protein: 6.3 g/dL (ref 6.1–8.1)

## 2020-08-06 LAB — BASIC METABOLIC PANEL
BUN/Creatinine Ratio: 16 (calc) (ref 6–22)
BUN: 19 mg/dL (ref 7–25)
CO2: 30 mmol/L (ref 20–32)
Calcium: 9.1 mg/dL (ref 8.6–10.3)
Chloride: 102 mmol/L (ref 98–110)
Creat: 1.19 mg/dL — ABNORMAL HIGH (ref 0.70–1.18)
Glucose, Bld: 104 mg/dL — ABNORMAL HIGH (ref 65–99)
Potassium: 4.6 mmol/L (ref 3.5–5.3)
Sodium: 138 mmol/L (ref 135–146)

## 2020-08-06 LAB — AMYLASE: Amylase: 342 U/L — ABNORMAL HIGH (ref 21–101)

## 2020-08-06 LAB — LIPASE: Lipase: 20 U/L (ref 7–60)

## 2020-08-16 ENCOUNTER — Other Ambulatory Visit: Payer: Self-pay

## 2020-08-16 ENCOUNTER — Ambulatory Visit
Admission: RE | Admit: 2020-08-16 | Discharge: 2020-08-16 | Disposition: A | Discharge status: Home / Self Care | Payer: PPO | Source: Ambulatory Visit | Attending: Family Medicine | Admitting: Family Medicine

## 2020-08-16 DIAGNOSIS — M4856XA Collapsed vertebra, not elsewhere classified, lumbar region, initial encounter for fracture: Secondary | ICD-10-CM | POA: Diagnosis not present

## 2020-08-16 DIAGNOSIS — R1110 Vomiting, unspecified: Secondary | ICD-10-CM | POA: Diagnosis not present

## 2020-08-16 DIAGNOSIS — J479 Bronchiectasis, uncomplicated: Secondary | ICD-10-CM | POA: Diagnosis not present

## 2020-08-16 DIAGNOSIS — R112 Nausea with vomiting, unspecified: Secondary | ICD-10-CM

## 2020-08-16 DIAGNOSIS — I700 Atherosclerosis of aorta: Secondary | ICD-10-CM | POA: Diagnosis not present

## 2020-08-16 MED ORDER — IOPAMIDOL (ISOVUE-300) INJECTION 61%
100.0000 mL | Freq: Once | INTRAVENOUS | Status: AC | PRN
  Administered 2020-08-16: 100 mL via INTRAVENOUS

## 2020-08-18 ENCOUNTER — Other Ambulatory Visit: Payer: Self-pay | Admitting: Internal Medicine

## 2020-08-22 DIAGNOSIS — M5126 Other intervertebral disc displacement, lumbar region: Secondary | ICD-10-CM | POA: Diagnosis not present

## 2020-08-22 DIAGNOSIS — M48062 Spinal stenosis, lumbar region with neurogenic claudication: Secondary | ICD-10-CM | POA: Diagnosis not present

## 2020-08-22 DIAGNOSIS — M5416 Radiculopathy, lumbar region: Secondary | ICD-10-CM | POA: Diagnosis not present

## 2020-08-22 DIAGNOSIS — M47816 Spondylosis without myelopathy or radiculopathy, lumbar region: Secondary | ICD-10-CM | POA: Diagnosis not present

## 2020-08-23 DIAGNOSIS — M48062 Spinal stenosis, lumbar region with neurogenic claudication: Secondary | ICD-10-CM | POA: Diagnosis not present

## 2020-08-23 DIAGNOSIS — M5126 Other intervertebral disc displacement, lumbar region: Secondary | ICD-10-CM | POA: Diagnosis not present

## 2020-08-23 DIAGNOSIS — R030 Elevated blood-pressure reading, without diagnosis of hypertension: Secondary | ICD-10-CM | POA: Diagnosis not present

## 2020-08-29 ENCOUNTER — Other Ambulatory Visit: Payer: Self-pay

## 2020-08-29 ENCOUNTER — Encounter: Payer: Self-pay | Admitting: Internal Medicine

## 2020-08-29 ENCOUNTER — Ambulatory Visit: Payer: PPO | Admitting: Internal Medicine

## 2020-08-29 VITALS — BP 108/68 | HR 62 | Ht 67.5 in | Wt 162.0 lb

## 2020-08-29 DIAGNOSIS — J8410 Pulmonary fibrosis, unspecified: Secondary | ICD-10-CM

## 2020-08-29 DIAGNOSIS — I480 Paroxysmal atrial fibrillation: Secondary | ICD-10-CM

## 2020-08-29 MED ORDER — FLECAINIDE ACETATE 50 MG PO TABS: 50.0000 mg | ORAL_TABLET | Freq: Three times a day (TID) | ORAL | 3 refills | Status: DC

## 2020-08-29 NOTE — Progress Notes (Signed)
HPI Tanner Bishop returns today for followup of his atrial fib. He is a pleasant 74 yo man with a h/o pulmonary fibrosis, PAF who has been well controlled on flecainide. He denies chest pain or sob. No syncope. No palpitations. He is no longer using oxygen at night for his pulmonary fibrosis. He denies edema. No Known Allergies   Current Outpatient Medications  Medication Sig Dispense Refill  . chlorpheniramine-HYDROcodone (TUSSIONEX PENNKINETIC ER) 10-8 MG/5ML SUER Take 5 mLs by mouth 2 (two) times daily as needed for cough (Do not take with other sedating medication). 140 mL 0  . diphenhydrAMINE (BENADRYL) 25 MG tablet Take 25 mg by mouth every 6 (six) hours as needed for allergies or sleep. Patient reports taking at bedtime    . flecainide (TAMBOCOR) 50 MG tablet Take 1 tablet (50 mg total) by mouth 3 (three) times daily. Please keep upcoming appt in September before anymore refills. Thank you 270 tablet 0  . fluticasone (FLONASE) 50 MCG/ACT nasal spray SPRAY 2 SPRAYS INTO EACH NOSTRIL EVERY DAY 48 mL 5  . gabapentin (NEURONTIN) 100 MG capsule TAKE 1 CAPSULE BY MOUTH THREE TIMES A DAY 60 capsule 5  . Nintedanib (OFEV) 150 MG CAPS Take 1 capsule (150 mg total) by mouth 2 (two) times daily. 60 capsule 11  . pantoprazole (PROTONIX) 40 MG tablet Take 1 tablet (40 mg total) by mouth 2 (two) times daily. 180 tablet 3  . predniSONE (DELTASONE) 5 MG tablet TAKE 1 TABLET BY MOUTH EVERY DAY WITH BREAKFAST 30 tablet 5  . zolpidem (AMBIEN) 5 MG tablet TAKE 1 TABLET BY MOUTH AT BEDTIME AS NEEDED FOR SLEEP. 30 tablet 5   Current Facility-Administered Medications  Medication Dose Route Frequency Provider Last Rate Last Admin  . 0.9 %  sodium chloride infusion  500 mL Intravenous Continuous Doran Stabler, MD         Past Medical History:  Diagnosis Date  . Allergy   . Arthritis   . Atrial flutter ([place])    a. s/p ablation 03/2015 Dr Lovena Le  . Cataract    removed both eyes  . GERD  (gastroesophageal reflux disease)   . H/O hiatal hernia   . Hyperlipidemia   . Injury of right hand    permanent damage after workplace 2009 and 2010 Dr Vernona Rieger [facility]  . Pulmonary fibrosis ([place])    sees Dr. Onnie Graham   . Renal stone 06/2014  . Ulcer    unresolved    ROS:   All systems reviewed and negative except as noted in the HPI.   Past Surgical History:  Procedure Laterality Date  . ATRIAL FLUTTER ABLATION N/A 04/08/2015   Procedure: ATRIAL FLUTTER ABLATION;  Surgeon: Evans Lance, MD;  Location: [facility] CATH LAB;  Service: Cardiovascular;  Laterality: N/A;  . CATARACT EXTRACTION, BILATERAL  2016  . COLONOSCOPY  03/01/2017   per Dr. Loletha Carrow, clear, repeat in 5 yrs (brother had colon cancer)   . ESOPHAGOGASTRODUODENOSCOPY  2007  . EXTRACORPOREAL SHOCK WAVE LITHOTRIPSY  06-17-14   per Dr. Jeffie Pollock   . HAND SURGERY Right   . LUNG BIOPSY Left 03/22/2013   Procedure: LUNG BIOPSY;  Surgeon: Melrose Nakayama, MD;  Location: [place];  Service: Thoracic;  Laterality: Left;  . POLYPECTOMY    . UPPER GASTROINTESTINAL ENDOSCOPY    . VIDEO ASSISTED THORACOSCOPY Left 03/22/2013   Procedure: VIDEO ASSISTED THORACOSCOPY;  Surgeon: Melrose Nakayama, MD;  Location: [place];  Service: Thoracic;  Laterality: Left;     Family History  Problem Relation Age of Onset  . Liver cancer Mother   . Diabetes Sister   . Lung cancer Sister   . Heart disease Brother   . Diabetes Brother   . Colon cancer Brother   . Diabetes Other   . Cancer Other        prostate  . Colon polyps Neg Hx   . Esophageal cancer Neg Hx   . Rectal cancer Neg Hx   . Stomach cancer Neg Hx      Social History   Socioeconomic History  . Marital status: Married    Spouse name: Not on file  . Number of children: Not on file  . Years of education: Not on file  . Highest education level: Not on file  Occupational History  . 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 Occupation: Disabled     Tobacco Use  . Smoking status: Former Smoker    Packs/day: 1.00    Years: 16.00    Pack years: 16.00    Types: Cigarettes    Quit date: 12/14/1978    Years since quitting: 41.7  . Smokeless tobacco: Never Used  Vaping Use  . Vaping Use: Never used  Substance and Sexual Activity  . Alcohol use: No    Alcohol/week: 0.0 standard drinks  . Drug use: No  . Sexual activity: Not on file  Other Topics Concern  . Not on file  Social History Narrative  . Not on file   Social Determinants of Health   Financial Resource Strain:   . Difficulty of Paying Living Expenses: Not on file  Food Insecurity:   . Worried About Charity fundraiser in the Last Year: Not on file  . Ran Out of Food in the Last Year: Not on file  Transportation Needs:   . Lack of Transportation (Medical): Not on file  . Lack of Transportation (Non-Medical): Not on file  Physical Activity:   . Days of Exercise per Week: Not on file  . Minutes of Exercise per Session: Not on file  Stress:   . Feeling of Stress : Not on file  Social Connections:   . Frequency of Communication with Friends and Family: Not on file  . Frequency of Social Gatherings with Friends and Family: Not on file  . Attends Religious Services: Not on file  . Active Member of Clubs or Organizations: Not on file  . Attends Archivist Meetings: Not on file  . Marital Status: Not on file  Intimate Partner Violence:   . Fear of Current or Ex-Partner: Not on file  . Emotionally Abused: Not on file  . Physically Abused: Not on file  . Sexually Abused: Not on file     BP 108/68   Pulse 62   Ht 5' 7.5" (1.715 m)   Wt 162 lb (73.5 kg)   SpO2 93%   BMI 25.00 kg/m   Physical Exam:  Well appearing NAD HEENT: Unremarkable Neck:  No JVD, no thyromegally Lymphatics:  No adenopathy Back:  No CVA tenderness Lungs:  Clear with no wheezes HEART:  Regular rate rhythm, no murmurs, no rubs, no clicks Abd:  soft, positive bowel sounds, no  organomegally, no rebound, no guarding Ext:  2 plus pulses, no edema, no cyanosis, no clubbing Skin:  No rashes no nodules Neuro:  CN II through XII intact, motor grossly intact  EKG - nsr  with RBBB   Assess/Plan: 1. PAF - he will continue low dose flecainide 50 mg tid. 2. Pulmonary fibrosis - he remains off of oxygen. He is under pulmonary management with Dr. Alfonso Patten. Continue Ofev and prednisone.   Carleene Overlie Jiayi Lengacher,MD  [facility].

## 2020-08-29 NOTE — Patient Instructions (Signed)
Medication Instructions:  Your physician recommends that you continue on your current medications as directed. Please refer to the Current Medication list given to you today.  Labwork: None ordered.  Testing/Procedures: None ordered.  Follow-Up: Your physician wants you to follow-up in: one year with [name].   You will receive a reminder letter in the mail two months in advance. If you don't receive a letter, please call our office to schedule the follow-up appointment.  Any Other Special Instructions Will Be Listed Below (If Applicable).  If you need a refill on your cardiac medications before your next appointment, please call your pharmacy.

## 2020-10-14 ENCOUNTER — Telehealth: Payer: Self-pay | Admitting: Family Medicine

## 2020-10-14 NOTE — Telephone Encounter (Signed)
Left message for patient to call back and schedule Medicare Annual Wellness Visit (AWV) either virtually or in office.  Last AWV 02/23/18; please schedule at anytime with [facility] Nurse Health Advisor 1.  This should be a 45 minute visit.

## 2020-10-22 DIAGNOSIS — M48062 Spinal stenosis, lumbar region with neurogenic claudication: Secondary | ICD-10-CM | POA: Diagnosis not present

## 2020-10-22 DIAGNOSIS — R030 Elevated blood-pressure reading, without diagnosis of hypertension: Secondary | ICD-10-CM | POA: Diagnosis not present

## 2020-10-22 DIAGNOSIS — M5126 Other intervertebral disc displacement, lumbar region: Secondary | ICD-10-CM | POA: Diagnosis not present

## 2020-11-12 ENCOUNTER — Other Ambulatory Visit: Payer: Self-pay

## 2020-11-12 ENCOUNTER — Ambulatory Visit: Payer: PPO | Admitting: Internal Medicine

## 2020-11-12 ENCOUNTER — Encounter: Payer: Self-pay | Admitting: Internal Medicine

## 2020-11-12 VITALS — BP 112/72 | HR 73 | Temp 97.7°F | Ht 67.0 in | Wt 169.4 lb

## 2020-11-12 DIAGNOSIS — G8929 Other chronic pain: Secondary | ICD-10-CM

## 2020-11-12 DIAGNOSIS — R748 Abnormal levels of other serum enzymes: Secondary | ICD-10-CM

## 2020-11-12 DIAGNOSIS — J84112 Idiopathic pulmonary fibrosis: Secondary | ICD-10-CM

## 2020-11-12 DIAGNOSIS — M549 Dorsalgia, unspecified: Secondary | ICD-10-CM

## 2020-11-12 DIAGNOSIS — R053 Chronic cough: Secondary | ICD-10-CM

## 2020-11-12 MED ORDER — PREDNISONE 5 MG PO TABS: ORAL_TABLET | ORAL | 3 refills | Status: DC

## 2020-11-12 MED ORDER — HYDROCOD POLST-CPM POLST ER 10-8 MG/5ML PO SUER: 5.0000 mL | Freq: Two times a day (BID) | ORAL | 0 refills | Status: DC | PRN

## 2020-11-12 NOTE — Patient Instructions (Addendum)
IPF (idiopathic pulmonary fibrosis) (HCC) Chronic cough Elevated amylase  Idiopathic pulmonary fibrosis: Is stable  Chronic cough: Is stable on prednisone low-dose and tussionex as needed Elevated Amylase: cause not known. Abdominal imaging normal. ? Due to ofev  Plan -HOld  nintedanib  X 2 weeks -> check amylase/LFT in 2 weeks -> then based on results can consider restart (esp if normal) - continue daily prednisone for cough due to IPF; do 3 month refill - fill tussionex for cough  Chronic back pain   - ok to consider chiropractor in my view but please double check with spine surgeon   - idea is symptom relief  - some names are Dr Deatra Robinson with [facility] or Dr Laverle Hobby with [facility]  Ability to do jury duty   - medically unfit for jury duty due to back issues, chronic advanced lung disease that has poor prognosis  Follow-up -Spirometry and DLCO in 4-6 months -Return to see [name] in 4-6 months for 30-minute visit

## 2020-11-12 NOTE — Progress Notes (Signed)
#IPF - uip path 03/22/13  - On OFEV since early May 2015   PFT FVC fev1 ratio BD fev1 TLC DLCO Walk test 172f x 3 laps wt rx             Spring 2014 2.9:L L/% /%        June 2015 2.7L/67% 2.34L/78% 86  3.68/57% 20.24/71%   ofev start  [name]  2016 2.5L/55% 2.1L/60% 84/108%    No desat, Pk HR 130    02/11/2015 Screening visit afferent cough study 2.59L/56% 2.24L/63% 87/112%    Dx with afluttter on ekg   Screen failed due to hx of stones  03/26/2015        No desat, PK HR 130    08/26/15 pft machine 2.73L/68% 2.38L/80% 87   [name]     06/29/2016 Office spiro 2.49L/56% 2.14L/65%     Pk HR 90 and lowest pulse ox 99% ->93%  ofev  11/02/2016  office full pft  2.61L/66% 2.3L/79%    17.23/60%   pfev  09/16/2017  2.53L/64%      Pulse ox 99% -> 93%, HR 81- > 84  ofev  12/22/2017        100% -> 97%,  HR 66 -> 96    09/19/2018        100% -> 93, HR 74 -> 85         OV 03/26/2015  Chief Complaint  Patient presents with  . Follow-up    [name], dry cough. CATH procedure on 04/08/15. Denies any chest tightnes/congestion.    74year old male with idiopathic pulmonary fibrosis. He is on Ofev after having failed Esbriet in the past due to side effects. He is tolerating Ofev well. Liver function test in March 2016 was normal. I last saw him in January 2016. Subsequently in February 2016 we screened him for a cough idiopathic pulmonary fibrosis study called afferent but he screen failed due to history of renal stones. At this time he was incidentally diagnosed with new onset atrial flutter. He has seen Dr. GCrissie Sicklesand has been started on anticoagulation with Xarelto and metoprolol. He is due to undergo a ablation. He is not aware of the increased bleeding risk with Ofev in the setting of anticoagulation. He assures me that the anti-coag and is only until he undergoes ablation and after that the plan is to stop it. Therefore only short-term anticoagulation with Xarelto.  Overall his effort tolerance is fine. He has postponed his [facility] lung transplant until he completes his ablation.  Walking desaturation test in the office today he did not desaturate. This is stable. Spirometry not done   Past medical history: Atrial flutter new diagnoses as above   OV 06/29/2016  Chief Complaint  Patient presents with  . Follow-up    pt states he is at baseline: sob with exertion, nonprod cough.      74year old male with idiopathic pulmonary fibrosis. He is on Ofev . He completed 6 months of PRAISE study (([id number] placebo) in l;ate winter/early spring 2017. This is [place]. Overall stable. No worsening dyspnea and cough. Continues daily exercise is walking many miles. Some days he is sitting more than the others. He is interested in more trials and results of the praise study. These results are not out yet. He is compliant with his Ofe last liver function test was in February 2017. There are no new issues. He believes his atrial fibrillation is  in sinus rhythm;    OV  11/02/2016  Chief Complaint  Patient presents with  . Follow-up    4 month IPF follow up and B&A review - does report increased prod cough with yellow mucus, wheezing, tightness in chest, increased SOB, x3-4 weeks with the weather change.  denies any f/c/s, hemoptysis, chest pain   IPF - on ofev. Last liver function test was in July 2017 and normal. Overall dyspnea stable. However he is been having diarrhea with ofev. He called in and we reduced it to 1 tablet a day and this improved the diarrhea. He back on 1 tablet twice a day. The diarrhea has returned although it is much milder. He has never taken any medication for this. Are function test shows slight improvement from July 2017 and similar levels to approximately one year ago  The new issue that he's having significant recurrent sinus infection in the back of chronic postnasal drainage. This since the fall 2017. He says that he and his wife catch  respirator infection and passive back and forth to each other. He is having cough, chest tightness, postnasal drip and yellow sputum. It is not affecting any dyspnea. There is no wheeze. There is no fever or hemoptysis.  OV 03/02/2017  Chief Complaint  Patient presents with  . Follow-up    Pt states his breathing is at baseline. Pt c/o dry cough - pt states this is baseline. Pt denies CP/tightness.     Idiopathic pulmonary fibrosis on Ofev. Last seen November 2017. He is a routine follow-up.  He is here with his wife. His sinus infections a result. He is interested in research protocols but we have none right now. He is tolerating his Ofev associated with some mild diarrhea occasional which she takes medication. He is not much of a problem. He tells me that she walks 6 times a week for 5 miles a day. On the treadmill at a 5 incline walking at 3-1/2 miles an hour. For this he feels a little more dyspneic than before. Walking desaturation test 185 feet 3 laps on room air: His pulse ox dropped from 98% at rest and 93% with exertion. His heart rate jumped from 66 at rest and 97 at peak exertion. Features are suggestive of mild progression of the disease. He is not attending pulmonary fibrosis foundation because he felt this was negative but that was years ago. We discussed this and he might be interested again.     OV 09/16/2017  Chief Complaint  Patient presents with  . Follow-up    PFT done today. Pt states that his breathing is gradually getting worse. SOB which depends if it is on exertion vs all the time and c/o prod cough with yellow mucus. Denies any CP   S IPF on fev followup. Overall doing well. He recent bronchitis and liked anabiotic and prednisone. He felt the prednisone helped his cough just currently rated as mild to moderate in severity. Helped the shortness of breath. He also helped arthralgia. He is now wondering what taking chronic prednisone. We had an extensive discussion  about this. Spirometry shows that the FVC stable compared to earlier this year but slightly progressive compared to one year ago. [facility] interstitial lung disease questionnaire shows symptoms. His wife is here with him. He is interested in research trials. He has participated in distress before. He is asking about opioid refill for cough if I wont do prednisone.   OV 12/22/2017  Chief Complaint  Patient  presents with  . Follow-up    Pt states he believes his breathing is at a stable point right now. States that he is coughing which is worse when he first wakes up and then also at night; occ will cough up yellow phlegm.     C.o ofev intolerance with fatigue, weight loss, diarrhea. Does not like lomotil. Wnaant to give it a holiday foir 2-3 weeks. INterested in research protocol - discussed [place]. 5 year cut off since diagnosis coming up 03/22/18. He is overall frustrated ith qualtiy of life. Walks 5 miles; starts feeling dizzy > 3 miles in but not checking pulse ox despite advice. Walking desaturation test on 12/22/2017 185 feet x 3 laps on ROOM AIR:  did noit desaturate. Rest pulse ox was 100%, final pulse ox was 97%. HR response 66/min at rest to 96/min at peak exertion. Patient Tanner Bishop  Did not Desaturate < 88% . Tanner Bishop yes  Desaturated </= 3% points. Tanner Bishop yes did get tachyardic     Walking desaturation test on 02/01/2018 185 feet x 3 laps on ROOM AIR:  did not desaturate. Rest pulse ox was 100%, final pulse ox was 91%. HR response 67/min at rest to 85/min at peak exertion. Patient Tanner Bishop  dod not Desaturate < 88% . Tanner Bishop yes diod  Desaturated </= 3% points. Tanner Bishop did not get tachyardic   OV 02/01/2018  Chief Complaint  Patient presents with  . Follow-up    Pt states he has been doing good since last visit. Started back on OFEV 12/24/17 after taking a holiday off of it.     FU IPF  Took ofev holiday. And then restarted 01/24/18 and doing well.  [place] ofev ok. KBILD ok.   OV 03/16/2018'  . Chief Complaint  Patient presents with  . Follow-up    PFT done today. States he has been doing well and denies any current complaints. Tolerating OFEV well and has been walking about 5 miles every day with no complaints.   Ms. Pineo returns for follow-up of idiopathic pulmonary fibrosis.  He is now back on nintedanib after giving it a holiday for a few weeks.  He is tolerating it well and no problems.  He goes for daily walks and has no problems.  His lung function was reviewed today and it shows for the first time a significant decline of greater than 200 cc between 2 time points.  This correlates with him not taking over for a few weeks nevertheless overall he is happy with his health.  He is not interested in research trials anymore.  He has a new question about travel advice encounter.  He plans an [place] from [place] intrajejunal in [place] for 10 days.  This will start on May 22, 2018.  He wants to make sure it is safe for him to go.  He said he has never been on a cruise and he and his wife have a strong desire to go as part of his goals of care.   OV 05/16/2018  Chief Complaint  Patient presents with  . Follow-up    Breathing is unchanged since last OV.    Tanner Bishop , 74 y.o. , with dob 01/10/1946 and male ,Not Hispanic or Latino from [address] [place] [address] - presents to pulm ILD clinic for IPF.  He presents with his wife.  And just under 7 days he is going to embark on a [place]  cruise.  This visit is precautionary visit just before that.  Most recent visit was in April 2019.  At this point in time in terms of his IPF he feels stable.  He is compliant with full dose of 5 and is having only mild diarrhea.  Other than that he is tolerating it just fine.  He is looking forward to his cruise.  He had pulmonary function test today that shows an improvement compared to last visit and it  is very similar to summer/fall 2018 in terms of FVC and DLCO.  Although he does feel like he is coming down with an acute bronchitis and wants to take some antibiotic and prednisone I had of the trip.  We also discussed about him having prophylactic antibiotics handy.  He wants a refill on his Tussionex for cough.  At this point in time he is past 5 years of diagnosis and not eligible for research trials he is not interested in transplant although the recent pulmonary fibrosis foundation meeting he did hear from a lot of transplant patients about the individual personal experience.       OV 09/19/2018  Subjective:  Patient ID: Tanner Bishop, male , DOB: [birth year] , age 14 y.o. , MRN: [id number] , ADDRESS: [place] [id number]   09/19/2018 -   Chief Complaint  Patient presents with  . Follow-up    Pt saw cards 08/23/18. States he is still having problems with his heart going in and out of rhythm and states he has had some episodes to where he almost passes out. States his breathing is at a stable point. States he also has  an occ cough.     HPI Tanner Bishop 74 y.o. -presents 5-year follow-up.  I personally saw him in June 2019.  Then approximately a month ago he presented and saw acutely my nurse practitioner because of dizziness.  It was felt to be A. fib RVR.  He subsequently saw Dr. Crissie Sickles his electrophysiologist.  I reviewed the note.  Flecainide was started.  He says despite that l approximately 10 weeks ago he had another episode of dizziness while getting out of the shower.  He was tachycardic with a heart rate of 140s.  He felt he might pass out but there is no focal neurologic deficits or abnormal sensation.  His pulse ox was normal at 97% at that time.  It happened at rest.  He says he has been walking on the treadmill for several miles and he does not desaturate.  He says his pulse ox is 93% at that time.  He never drops into the 80s.  He does not think his dizziness  and palpitation issues are related to his pulmonary fibrosis.  However [name] is wondering about oxygen drops during this time.  He is not tolerating his nintedanib at full dose without any problems.  He has had a successful [place] trip.  He is participating in the patient support group.  He is up-to-date with his flu shot      OV 10/13/2018  Subjective:  Patient ID: Tanner Bishop, male , DOB: 06/21/1946 , age 46 y.o. , MRN: [id number] , ADDRESS: [place] [id number]   10/13/2018 -   Chief Complaint  Patient presents with  . Follow-up    review PFT.  c/o baseline sob with exertion.       HPI Tanner Bishop 74 y.o. -presents for follow-up of IPF to the ILD clinic.  This visit is arranged because letter physiologist [name] was concerned about hypoxemia effects on his atrial fibrillation.  At this point in time patient tells me that after increasing dose of flecainide his atrial fibrillation and palpitations have improved and resolved.  Overall he feels stable.  On September 26, 2018 he had 6-minute walk test and this was pretty robust with walking 384 meters and with lowest pulse ox of 95%.  Then on October 05, 2018 he had overnight pulse oximetry that shows 12 minutes of desaturation less than 88%.  He is not interested in oxygen.  Then he called his October 07, 2018 with worsening bronchitis episodes and we gave him 5 days of prednisone and antibiotics.  He finished his last dose yesterday.  Overall he feels stable but pulmonary function test today shows decline in both FVC and DLCO.  And then when I questioned him he felt like he still had some residual bronchiti and feels another course of prednisone could help him.  There are no other new issues.  Last liver function test October 2019 and was normal.      OV 01/12/2019  Subjective:  Patient ID: Tanner Bishop, male , DOB: 1946-02-26 , age 57 y.o. , MRN: [id number] , ADDRESS: [place]  [id number]   01/12/2019 -   Chief Complaint  Patient presents with  . Follow-up    PFT performed today. Pt states he is about the same since last visit.States he still becomes SOB with exertion, has a lot of coughing but has been taking mucinex, and also has had some CP or chest tightness.     HPI Tanner Bishop 74 y.o. -returns for follow-up of his IPF.  He is now more than 5 years since diagnosis.,  April 2020 he will be 6 years since diagnosis.  He continues to exercise well on the treadmill and according to his wife he is does well.  He is not dropping oxygen at this time.  He does not have a cough when needed works on the tile.  However at other times the day especially when he is trying to lie down to bed or when he gets up in the morning and goes to the bathroom he has really bad cough.  Overall the cough severity is now 6 out of 10 in terms of how it is impacting his quality of life.  He tells me that every time he takes prednisone for the cough he feels better but then when he comes off prednisone cough gets worse.  He said multiple prednisone courses of brief duration for this chronic cough.  In addition for the last few weeks has had yellow sinus drainage and sinus headache and sinus congestion and he feels this is again making his chronic cough worse.  He is open to taking antibiotic and prednisone course.  In terms of taking nintedanib he has no side effects.  His last liver function test reviewed was in October 2019 and it was normal at that time.  Overall at this point in time he really wants good significant support for his cough in terms of affecting his quality of life.    ROS - per HPI     OV 02/16/2019  Subjective:  Patient ID: Tanner Bishop, male , DOB: 1946-11-25 , age 37 y.o. , MRN: [id number] , ADDRESS: [address] [place] [id number]   02/16/2019 -   Chief Complaint  Patient presents with  . Follow-up    HRCT  and sinus CT performed 2/18. Pt states he is about the same  as last visit and states SOB is about the same. Pt still has a dry cough. Denies any complaints of CP/chest tightness.     HPI Tanner Bishop 74 y.o. -returns for follow-up of his IPF.  He presents with his wife.  He is here to review the test results.  He had high-resolution CT chest and CT sinus.  These were done in order to reevaluate his disease because he is having significant cough.  His high-resolution CT chest shows only mild progression in his IPF in 3 years.  However there is significant new findings of tracheobronchomalacia although there is no lung cancer.  He also has two-vessel coronary artery calcification.  These are new findings.  Since last visit and a sinus episode he is stable.  He is currently run out of his nasal steroids.  He says the pharmacy said that I declined his prescription request.  I do not recollect such a conversation.  In any event he is now going to participate in the cough study called scenic.  He has received a copy of the consent form.  He is rated.  He has an appointment for his consent visit.  It is possible that the nasal steroid is on the exclusion list but I do not have the exclusion list with me.  Anyway he is not consented.  He is not having chest pain when he does his treadmill exercises.  He works out hard.  He states he gets tachycardic.  He says he has a 6 times that he will die from his heart condition before his pulmonary fibrosis.  He has upcoming appointment with his cardiologist [name] for atrial fibrillation.  ...................................................................... OV 06/08/2019   Subjective:  Patient ID: Tanner Bishop, male , DOB: 23-Sep-1946 , age 26 y.o. , MRN: [id number] , ADDRESS: [place] [id number]   06/08/2019 -   Chief Complaint  Patient presents with  . Follow-up    1wk f/u for IPF. Patient stated that he was given a round of prednisone last week for some SOB but is feeling much better.       HPI Tanner Bishop 74 y.o. -returns for IPF follow-up.  Last seen in March 2020.  Since the pandemic started he has been social distancing.  He has not attended any support group.  He has been tolerating his Ofev quite well without any difficulty.  Recently had a bronchitis exacerbation and we gave prednisone.  This resolved his cough.  He was going to participate in a cough study but because of the pandemic the study got canceled.  He tells me that every time he takes prednisone the cough goes away.  He is very interested in taking daily prednisone for symptom relief of cough.  We discussed the side effects of prednisone that include weight gain, easy bruising, adrenal insufficiency, osteoporosis, cataracts hypertension, diabetes, immunosuppression.  Despite all this he wants to take a low-dose prednisone daily.  His last liver function test was in March 2020.  This needs to be retested because he is on nintedanib.  His symptom scores at walk test show relative stability.  His last pulmonary function test was in January 2020 but because of the pandemic is on hold.  We are using symptom and walk test to determine progression.         OV 04/11/2020  Subjective:  Patient ID: Tanner Bishop, male , DOB: 03/07/1946 ,  age 58 y.o. , MRN: [id number] , ADDRESS: [address] [place] [address]   04/11/2020 -   Chief Complaint  Patient presents with  . Follow-up    PFT performed today. Pt states he feels like his breathing is gradually becoming worse and states it could be at any time that he is SOB.   #IPF - uip path 03/22/13  - On OFEV since early May 2015 -On chronic daily prednisone because of cough since 2020  HPI Tanner Bishop 73 y.o. -presents with his wife IPF follow-up.  Last saw him June 2020.  After that he feels his dyspnea is worse.  He says that he could do several miles in one 1 hour and 30 minutes on the treadmill.  The same distance and now taking 1 hour and 40 minutes.  He  feels his disease is getting worse.  He says the prednisone is working well for him because of improved cough and wellbeing.  He is having some skin bruising because of that.  The nintedanib he is tolerating well except for some mild diarrhea.  On objective symptom score is actually similar to 1 year ago but may be slightly worse than 6 months ago or 9 months ago.  On pulmonary function testing he is stable compared to 1 year ago but slow and steadily he is definitely worse over the many years.  We noticed on his walking desaturation test that he did not mount a tachycardic response as he is done in the past.  He is on flecainide for atrial fibrillation.  He takes his 3 times daily.  He feels his atrial fibrillation is under good control.  There is no chest pain.  There is no weight loss.  Is no loss of physical conditioning.  He is interested in clinical trials.   A month ago he had nausea and his amylase/lipase was elevated.  He says he is fine now.  Primary care address this. ROS - per HPI  IMPRESSION: 1. 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 Findings are consistent with UIP per consensus guidelines: Diagnosis of Idiopathic Pulmonary Fibrosis: An Official ATS/ERS/JRS/ALAT Clinical Practice Guideline. [place], Iss 5, [phone], Aug 14 2017. 2. 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 3. Two-vessel coronary atherosclerosis.  Aortic Atherosclerosis (ICD10-I70.0).   Electronically Signed   By: Ilona Sorrel M.D.   On: 01/31/2019 12:24  OV 06/11/2020  Subjective:  Patient ID: Tanner Bishop, male , DOB: Mar 25, 1946 , age 38 y.o. , MRN: [id number] , ADDRESS: [place] [id number]   06/11/2020 -   Chief Complaint  Patient presents with  . Follow-up    PFT performed today.  Pt states he has been doing good since  last visit. States breathing is about the same.,    #IPF - uip path 03/22/13  - On OFEV since early May 2015 -On chronic daily prednisone because of cough since 2020  HPI Tanner Bishop 74 y.o. -returns to clinic for ILD follow-up.  In the interim he feels that his wife has worsening dementia.  She is able to self-care but only does minimal cooking.  She is more irritable.  His daughter Theadora Rama is now on disability because of back issues.  Patient has a son in [place] who he is close with.  Together they take care of his wife.  He says he has become  the primary caregiver now for his wife.  Nevertheless she is functional and does some ADLs.  He continues with nintedanib 150 mg twice daily.  Overall tolerance is good just mild diarrhea.  This is baseline.  Shortness of breath is baseline symptom score is 14 and similar to the past.  Pulmonary function test appears stable.  Walking desaturation test is stable.  His last liver function test was 2 months ago.  His main issue is that he is having some financial issues covering the cost of nintedanib.  He $6000 grant is running out.  He only has 1 month supply left.  He wants to meet with the pharmacist.      OV 11/12/2020   Subjective:  Patient ID: Tanner Bishop, male , DOB: 04-Nov-1946, age 72 y.o. years. , MRN: [id number],  ADDRESS: [place] [address] PCP  [name], MD Providers : Treatment Team:  Attending Provider: Brand Males, MD Patient Care Team: [name], MD as PCP - Irineo Axon, MD as Consulting Physician (Pulmonary Disease) [place], Anne Ng, [facility] as Pharmacist (Pharmacist)    Chief Complaint  Patient presents with  . Follow-up    IPF, still getting SOB on DOE with some chest tightness       HPI Tanner Bishop 74 y.o. -IPF follow-up.  Presents with his wife who I am seeing after a long time.  According to the patient patient himself is stable from IPF standpoint.  However he  says for the last several months he has had an exacerbation in his chronic back pain.  He has significant pain.  He has had steroid injections.  The first 1 was successful but the second 1 and subsequent failed.  He is not able to do his walks as he used to in the past.  He has gained some weight.  He is frustrated by this.  He is asking if he can go see a Restaurant manager, fast food.  The other issue with him was elevated amylase.  We never found a cause for this.  So his primary care physician worked him up he had abdominal CT imaging on 08/16/2020 and this was normal.  Otherwise he is okay  He is asking for a refill on his prednisone that he takes for cough.  He also wants Tussionex as needed.  He takes that for cough.  He also says that he got summoned for jury duty.  He is wondering what to do.  I told him under no circumstances he should ever do jury duty.  I worry about the risk  SYMPTOM SCALE - ILD 02/16/2019  06/08/2019  04/11/2020  06/11/2020  11/12/2020 169# weight  O2 use _0   Shortness of Breath 0 -> 5 scale with 5 being worst (score 6 If unable to do)      At rest 1 1 0 1 2  Simple tasks - showers, clothes change, eating, shaving _1 Household (dishes, doing bed, laundry) _2 Shopping _3 Walking level at own pace _4 Walking up Stairs _5 Total (40 - 48) Dyspnea Score _6 How bad is your cough? 3 Xx - due to recent pred _7 How bad is your fatigue 3 x _8 nausea   0 0 0  vomit   0 0  0  diarrhea   _0 anxiety   0 0 0  deopresso   0 0 0       Simple office walk 185 feet x  3 laps goal with forehead probe 09/19/2018  01/12/2019  02/16/2019  06/08/2019  04/11/2020  06/11/2020  11/12/2020   O2 used Room air Room air Room air Room air  ra ra  Number laps completed _1 Comments about pace normal normal normal normal  Mod pace avg [ace  Resting Pulse Ox/HR 100% and 73/min 100% ad 74/min 100% and 68/min 98% and 82/min  99% and 60/min 98% and 54 98% and 73/min  Final Pulse Ox/HR 93% and 85/min 92% and 84/min 93% and 81/min 91% and 96/mni 91% and 74/mm 90% and 71/min 90% and 86/min  Desaturated </= 88% no no no no yes yes yes  Desaturated <= 3% points Yes, 7 poin Yes, 8 ponts Yes, 7 points Yes, 7 points Yes, 8point Yes, 8 point Yes, 8 points  Got Tachycardic >/= 90/min no no no yes     Symptoms at end of test x none none none dyspnea none None dyspnea  Miscellaneous comments x x stable stable but tachy for first time        Results for Tanner Bishop, Tanner Bishop (MRN [id number]) as of 04/11/2020 12:14  Ref. Range 05/14/2014 15:46 07/24/2015 12:43 08/26/2015 10:52 11/02/2016 09:51 05/19/2017 08:41 09/16/2017 08:47 03/16/2018 08:48 05/16/2018 10:53 10/13/2018 09:52 01/12/2019 09:49 04/11/2020 10:56 06/11/2020   FVC-Pre Latest Units: L 2.71 2.69 2.73 2.61 2.51 2.53 2.29 2.38 2.25 2.21 2.23 2.26  FVC-%Pred-Pre Latest Units: % 67 67 68 66 63 64 58 60 57 56 56 57%  Results for Tanner Bishop, SILVERSMITH (MRN [id number]) as of 04/11/2020 12:14  Ref. Range 05/14/2014 15:46 07/24/2015 12:43 08/26/2015 10:52 11/02/2016 09:51 05/19/2017 08:41 09/16/2017 08:47 03/16/2018 08:48 05/16/2018 10:53 10/13/2018 09:52 01/12/2019 09:49 04/11/2020 10:56 06/11/2020   DLCO unc Latest Units: ml/min/mmHg 20.24 15.36 15.36 17.23 15.83  13.10 16.48 14.17 14.38 14.72 13.69  DLCO unc % pred Latest Units: % 71 54 54 60 55  46 58 50 50 62 58%     ROS - per HPI     has a past medical history of Allergy, Arthritis, Atrial flutter ([place]), Cataract, GERD (gastroesophageal reflux disease), H/O hiatal hernia, Hyperlipidemia, Injury of right hand, Pulmonary fibrosis ([place]), Renal stone (06/2014), and Ulcer.   reports that he quit smoking about 41 years ago. His smoking use included cigarettes. He has a 16.00 pack-year smoking history. He has never used smokeless tobacco.  Past Surgical History:  Procedure Laterality Date  . ATRIAL FLUTTER ABLATION N/A 04/08/2015   Procedure: ATRIAL FLUTTER  ABLATION;  Surgeon: Evans Lance, MD;  Location: [facility] CATH LAB;  Service: Cardiovascular;  Laterality: N/A;  . CATARACT EXTRACTION, BILATERAL  2016  . COLONOSCOPY  03/01/2017   per Dr. Loletha Carrow, clear, repeat in 5 yrs (brother had colon cancer)   . ESOPHAGOGASTRODUODENOSCOPY  2007  . EXTRACORPOREAL SHOCK WAVE LITHOTRIPSY  06-17-14   per Dr. Jeffie Pollock   . HAND SURGERY Right   . LUNG BIOPSY Left 03/22/2013   Procedure: LUNG BIOPSY;  Surgeon: Melrose Nakayama, MD;  Location: [facility];  Service: Thoracic;  Laterality: Left;  . POLYPECTOMY    . UPPER GASTROINTESTINAL ENDOSCOPY    . VIDEO ASSISTED THORACOSCOPY Left 03/22/2013   Procedure: VIDEO ASSISTED THORACOSCOPY;  Surgeon: Melrose Nakayama, MD;  Location: [facility];  Service:  Thoracic;  Laterality: Left;    No Known Allergies  Immunization History  Administered Date(s) Administered  . Fluad Quad(high Dose 65+) 08/25/2019  . Influenza Split 09/13/2013  . Influenza, High Dose Seasonal PF 10/06/2016, 09/16/2017, 08/17/2018  . Influenza-Unspecified 09/07/2014, 09/14/2015  . PFIZER SARS-COV-2 Vaccination 02/12/2020, 03/12/2020  . Pneumococcal Conjugate-13 02/16/2017  . Pneumococcal Polysaccharide-23 05/26/2013  . Td 12/15/2007  . Tdap 02/23/2018  . Zoster 02/19/2014  . Zoster Recombinat (Shingrix) 10/13/2018, 07/25/2019    Family History  Problem Relation Age of Onset  . Liver cancer Mother   . Diabetes Sister   . Lung cancer Sister   . Heart disease Brother   . Diabetes Brother   . Colon cancer Brother   . Diabetes Other   . Cancer Other        prostate  . Colon polyps Neg Hx   . Esophageal cancer Neg Hx   . Rectal cancer Neg Hx   . Stomach cancer Neg Hx      Current Outpatient Medications:  .  chlorpheniramine-HYDROcodone (TUSSIONEX PENNKINETIC ER) 10-8 MG/5ML SUER, Take 5 mLs by mouth 2 (two) times daily as needed for cough (Do not take with other sedating medication)., Disp: 140 mL, Rfl: 0 .  diphenhydrAMINE (BENADRYL) 25 MG  tablet, Take 25 mg by mouth every 6 (six) hours as needed for allergies or sleep. Patient reports taking at bedtime, Disp: , Rfl:  .  flecainide (TAMBOCOR) 50 MG tablet, Take 1 tablet (50 mg total) by mouth 3 (three) times daily., Disp: 270 tablet, Rfl: 3 .  fluticasone (FLONASE) 50 MCG/ACT nasal spray, SPRAY 2 SPRAYS INTO EACH NOSTRIL EVERY DAY, Disp: 48 mL, Rfl: 5 .  gabapentin (NEURONTIN) 100 MG capsule, TAKE 1 CAPSULE BY MOUTH THREE TIMES A DAY, Disp: 60 capsule, Rfl: 5 .  Nintedanib (OFEV) 150 MG CAPS, Take 1 capsule (150 mg total) by mouth 2 (two) times daily., Disp: 60 capsule, Rfl: 11 .  pantoprazole (PROTONIX) 40 MG tablet, Take 1 tablet (40 mg total) by mouth 2 (two) times daily., Disp: 180 tablet, Rfl: 3 .  predniSONE (DELTASONE) 5 MG tablet, TAKE 1 TABLET BY MOUTH EVERY DAY WITH BREAKFAST, Disp: 30 tablet, Rfl: 5 .  zolpidem (AMBIEN) 5 MG tablet, TAKE 1 TABLET BY MOUTH AT BEDTIME AS NEEDED FOR SLEEP., Disp: 30 tablet, Rfl: 5  Current Facility-Administered Medications:  .  0.9 %  sodium chloride infusion, 500 mL, Intravenous, Continuous, Danis, Estill Cotta III, MD      Objective:   Vitals:   11/12/20 1438  BP: 112/72  Pulse: 73  Temp: 97.7 F (36.5 C)  TempSrc: Oral  SpO2: 96%  Weight: 169 lb 6.4 oz (76.8 kg)  Height: _0  (1.702 m)    Estimated body mass index is 26.53 kg/m as calculated from the following:   Height as of this encounter: _1  (1.702 m).   Weight as of this encounter: 169 lb 6.4 oz (76.8 kg).  _2 @  Filed Weights   11/12/20 1438  Weight: 169 lb 6.4 oz (76.8 kg)     Physical Exam  General Appearance:    Alert, cooperative, no distress, appears stated age - yes , Deconditioned looking - no , OBESE  - no, Sitting on Wheelchair -  No. Some weight gain. Looks in chronic pain.  Head:    Normocephalic, without obvious abnormality, atraumatic  Eyes:    PERRL, conjunctiva/corneas clear,  Ears:    Normal TM's and external ear canals, both ears  Nose:   Nares normal, septum midline, mucosa normal, no drainage    or sinus tenderness. OXYGEN ON  - no . Patient is @ ra   Throat:   Lips, mucosa, and tongue normal; teeth and gums normal. Cyanosis on lips - no  Neck:   Supple, symmetrical, trachea midline, no adenopathy;    thyroid:  no enlargement/tenderness/nodules; no carotid   bruit or JVD  Back:     Symmetric, no curvature, ROM normal, no CVA tenderness  Lungs:     Distress - no , Wheeze no, Barrell Chest - no, Purse lip breathing - no, Crackles - yes velcro   Chest Wall:    No tenderness or deformity.    Heart:    Regular rate and rhythm, S1 and S2 normal, no rub   or gallop, Murmur - no  Breast Exam:    NOT DONE  Abdomen:     Soft, non-tender, bowel sounds active all four quadrants,    no masses, no organomegaly. Visceral obesity - mild yes  Genitalia:   NOT DONE  Rectal:   NOT DONE  Extremities:   Extremities - normal, Has Cane - no, Clubbing - no, Edema - no  Pulses:   2+ and symmetric all extremities  Skin:   Stigmata of Connective Tissue Disease - no  Lymph nodes:   Cervical, supraclavicular, and axillary nodes normal  Psychiatric:  Neurologic:   Pleasant - yes, Anxious - no, Flat affect - no  CAm-ICU - neg, Alert and Oriented x 3 - yes, Moves all 4s - yes, Speech - normal, Cognition - intact          Assessment:       ICD-10-CM   1. IPF (idiopathic pulmonary fibrosis) (HCC)  H67.591 Pulmonary function test  2. Chronic cough  R05.3   3. Elevated amylase  R74.8   4. Other chronic back pain  M54.9    G89.29        Plan:     Patient Instructions  IPF (idiopathic pulmonary fibrosis) (HCC) Chronic cough Elevated amylase  Idiopathic pulmonary fibrosis: Is stable  Chronic cough: Is stable on prednisone low-dose and tussionex as needed Elevated Amylase: cause not known. Abdominal imaging normal. ? Due to ofev  Plan -HOld  nintedanib  X 2 weeks -> check amylase -> then based on results can consider restart  (esp if normal) - continue daily prednisone for cough due to IPF; do 3 month refill - fill tussionex for cough  Chronic back pain   - ok to consider chiropractor in my view but please double check with spine surgeon   - idea is symptom relief  - some names are Dr Deatra Robinson with [facility] or Dr Laverle Hobby with [facility]  Ability to do jury duty   - medically unfit for jury duty due to back issues, chronic advanced lung disease that has poor prognosis  Follow-up -Spirometry and DLCO in 4-6 months -Return to see [name] in 4-6 months for 30-minute visit     SIGNATURE    Dr. Brand Males, M.D., F.C.C.P,  Pulmonary and Critical Care Medicine Staff Physician, [facility] Director - Interstitial Lung Disease  Program  [facility] [place], [place], [address]  Pager: [phone], If no answer or between  15:00h - 7:00h: call [phone] Telephone: [phone]  3:27 PM 11/12/2020

## 2020-11-18 NOTE — Progress Notes (Signed)
Subjective:   Tanner Bishop is a 74 y.o. male who presents for Medicare Annual/Subsequent preventive examination.  I connected with Tanner Bishop  today by telephone and verified that I am speaking with the correct person using two identifiers. Location patient: home Location provider: work Persons participating in the virtual visit: patient, provider.   I discussed the limitations, risks, security and privacy concerns of performing an evaluation and management service by telephone and the availability of in person appointments. I also discussed with the patient that there may be a patient responsible charge related to this service. The patient expressed understanding and verbally consented to this telephonic visit.    Interactive audio and video telecommunications were attempted between this provider and patient, however failed, due to patient having technical difficulties OR patient did not have access to video capability.  We continued and completed visit with audio only.     Review of Systems    N/A  Cardiac Risk Factors include: advanced age (>84mn, >>82women);male gender;dyslipidemia     Objective:    Today's Vitals   11/19/20 1039  PainSc: 4    There is no height or weight on file to calculate BMI.  Advanced Directives 11/19/2020 02/23/2018 02/16/2017 02/15/2017 07/10/2015 04/08/2015 03/26/2015  Does Patient Have a Medical Advance Directive? _0  Yes Yes  Type of Advance Directive Living will;Healthcare Power of [place] will [place] will Living will;Healthcare Power of [place]  Does patient want to make changes to medical advance directive? No - Patient declined - - - - No - Patient declined -  Copy of [place] Chart? No - copy requested - - - - No - copy requested -  Would patient like information on creating a medical advance  directive? - - - - - - -  Pre-existing out of facility DNR order (yellow form or pink MOST form) - - - - - - -    Current Medications (verified) Outpatient Encounter Medications as of 11/19/2020  Medication Sig  . diphenhydrAMINE (BENADRYL) 25 MG tablet Take 25 mg by mouth every 6 (six) hours as needed for allergies or sleep. Patient reports taking at bedtime  . flecainide (TAMBOCOR) 50 MG tablet Take 1 tablet (50 mg total) by mouth 3 (three) times daily.  . fluticasone (FLONASE) 50 MCG/ACT nasal spray SPRAY 2 SPRAYS INTO EACH NOSTRIL EVERY DAY  . gabapentin (NEURONTIN) 100 MG capsule TAKE 1 CAPSULE BY MOUTH THREE TIMES A DAY  . Nintedanib (OFEV) 150 MG CAPS Take 1 capsule (150 mg total) by mouth 2 (two) times daily.  . pantoprazole (PROTONIX) 40 MG tablet Take 1 tablet (40 mg total) by mouth 2 (two) times daily.  . predniSONE (DELTASONE) 5 MG tablet TAKE 1 TABLET BY MOUTH EVERY DAY WITH BREAKFAST  . zolpidem (AMBIEN) 5 MG tablet TAKE 1 TABLET BY MOUTH AT BEDTIME AS NEEDED FOR SLEEP.  .Marland Kitchenchlorpheniramine-HYDROcodone (TUSSIONEX PENNKINETIC ER) 10-8 MG/5ML SUER Take 5 mLs by mouth 2 (two) times daily as needed for cough (Do not take with other sedating medication). (Patient not taking: Reported on 11/19/2020)   Facility-Administered Encounter Medications as of 11/19/2020  Medication  . 0.9 %  sodium chloride infusion    Allergies (verified) Patient has no known allergies.   History: Past Medical History:  Diagnosis Date  . Allergy   . Arthritis   . Atrial flutter ([place]  a. s/p ablation 03/2015 Dr Lovena Le  . Cataract    removed both eyes  . GERD (gastroesophageal reflux disease)   . H/O hiatal hernia   . Hyperlipidemia   . Injury of right hand    permanent damage after workplace 2009 and 2010 Dr Vernona Rieger [facility]  . Pulmonary fibrosis ([place])    sees Dr. Onnie Graham   . Renal stone 06/2014  . Ulcer    unresolved   Past Surgical History:  Procedure Laterality Date  .  ATRIAL FLUTTER ABLATION N/A 04/08/2015   Procedure: ATRIAL FLUTTER ABLATION;  Surgeon: Evans Lance, MD;  Location: [facility] CATH LAB;  Service: Cardiovascular;  Laterality: N/A;  . CATARACT EXTRACTION, BILATERAL  2016  . COLONOSCOPY  03/01/2017   per Dr. Loletha Carrow, clear, repeat in 5 yrs (brother had colon cancer)   . ESOPHAGOGASTRODUODENOSCOPY  2007  . EXTRACORPOREAL SHOCK WAVE LITHOTRIPSY  06-17-14   per Dr. Jeffie Pollock   . HAND SURGERY Right   . LUNG BIOPSY Left 03/22/2013   Procedure: LUNG BIOPSY;  Surgeon: Melrose Nakayama, MD;  Location: [place];  Service: Thoracic;  Laterality: Left;  . POLYPECTOMY    . UPPER GASTROINTESTINAL ENDOSCOPY    . VIDEO ASSISTED THORACOSCOPY Left 03/22/2013   Procedure: VIDEO ASSISTED THORACOSCOPY;  Surgeon: Melrose Nakayama, MD;  Location: [facility] OR;  Service: Thoracic;  Laterality: Left;   Family History  Problem Relation Age of Onset  . Liver cancer Mother   . Diabetes Sister   . Lung cancer Sister   . Heart disease Brother   . Diabetes Brother   . Colon cancer Brother   . Diabetes Other   . Cancer Other        prostate  . Colon polyps Neg Hx   . Esophageal cancer Neg Hx   . Rectal cancer Neg Hx   . Stomach cancer Neg Hx    Social History   Socioeconomic History  . Marital status: Married    Spouse name: Not on file  . Number of children: Not on file  . Years of education: Not on file  . Highest education level: Not on file  Occupational History  . 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 Occupation: Disabled   Tobacco Use  . Smoking status: Former Smoker    Packs/day: 1.00    Years: 16.00    Pack years: 16.00    Types: Cigarettes    Quit date: 12/14/1978    Years since quitting: 41.9  . Smokeless tobacco: Never Used  Vaping Use  . Vaping Use: Never used  Substance and Sexual Activity  . Alcohol use: No    Alcohol/week: 0.0 standard drinks  . Drug use: No  . Sexual activity: Not on file  Other Topics Concern  . Not on file    Social History Narrative  . Not on file   Social Determinants of Health   Financial Resource Strain: Low Risk   . Difficulty of Paying Living Expenses: Not hard at all  Food Insecurity: No Food Insecurity  . Worried About Charity fundraiser in the Last Year: Never true  . Ran Out of Food in the Last Year: Never true  Transportation Needs: No Transportation Needs  . Lack of Transportation (Medical): No  . Lack of Transportation (Non-Medical): No  Physical Activity: Sufficiently Active  . Days of Exercise per Week: 7 days  . Minutes of Exercise per Session: 50 min  Stress: No Stress  Concern Present  . Feeling of Stress : Not at all  Social Connections: Socially Integrated  . Frequency of Communication with Friends and Family: More than three times a week  . Frequency of Social Gatherings with Friends and Family: More than three times a week  . Attends Religious Services: More than 4 times per year  . Active Member of Clubs or Organizations: Yes  . Attends Archivist Meetings: More than 4 times per year  . Marital Status: Married    Tobacco Counseling Counseling given: Not Answered   Clinical Intake:  Pre-visit preparation completed: Yes  Pain : 0-10 Pain Score: 4  Pain Type: Chronic pain Pain Location: Back Pain Orientation: Lower Pain Radiating Towards: leg and into foot Pain Descriptors / Indicators: Nagging Pain Onset: More than a month ago Pain Frequency: Intermittent Pain Relieving Factors: sitting down  Pain Relieving Factors: sitting down  Nutritional Risks: Nausea/ vomitting/ diarrhea (diarrhea from Ofev medication) Diabetes: No  How often do you need to have someone help you when you read instructions, pamphlets, or other written materials from your doctor or pharmacy?: 1 - Never What is the last grade level you completed in school?: Some College  Diabetic?No   Interpreter Needed?: No  Information entered by :: [place]  of Daily Living In your present state of health, do you have any difficulty performing the following activities: 11/19/2020  Hearing? N  Vision? N  Difficulty concentrating or making decisions? Y  Comment has issues with remembering names  Walking or climbing stairs? Y  Comment gets a little SOB  Dressing or bathing? N  Doing errands, shopping? N  Preparing Food and eating ? N  Using the Toilet? N  In the past six months, have you accidently leaked urine? N  Do you have problems with loss of bowel control? N  Managing your Medications? N  Managing your Finances? N  Housekeeping or managing your Housekeeping? N  Some recent data might be hidden    Patient Care Team: [name], MD as PCP - General Brand Males, MD as Consulting Physician (Pulmonary Disease) Earnie Larsson, [facility] as Pharmacist (Pharmacist)  Indicate any recent Medical Services you may have received from other than [facility] providers in the past year (date may be approximate).     Assessment:   This is a routine wellness examination for Tanner Bishop.  Hearing/Vision screen  Hearing Screening   _0  _1  _2  _3  _4  _5  _6  _7  _8   Right ear:           Left ear:           Vision Screening Comments: Patient states has not had eyes examined in a little over a year. Had cataracts removed in past   Dietary issues and exercise activities discussed: Current Exercise Habits: Home exercise routine, Type of exercise: walking, Time (Minutes): 50, Frequency (Times/Week): 7, Weekly Exercise (Minutes/Week): 350, Intensity: Moderate, Exercise limited by: respiratory conditions(s)  Goals    . patient     Looks forward to family vacation     . Patient Stated     Would like to travel some; [place];  To maintain exercise and current lifestyle     . Patient Stated     I will continue to walk 5 miles on treadmill daily    . Pharmacy Care Plan     Current Barriers:  . Chronic Disease Management  support, education, and care coordination needs related to HLD, GERD, Atrial flutter, Idiopathic  pulmonary fibrosis, Insomnia, Sinusitis, Vitamin D deficiency, Osteoarthritis   Pharmacist Clinical Goal(s):  Marland Kitchen Cholesterol goals: Total Cholesterol goal under 200, Triglycerides goal under 150, HDL goal above 40 (men) or above 50 (women), LDL goal under 100.  . Maintain Vitamin D-25 level at or above 30 . Continue working on lifestyle modifications (diet/exercise). . Minimize reflux symptoms   Interventions: . Comprehensive medication review performed. . Assessed patient's education and care coordination needs . Provided disease specific education to patient. . Discussed how to reduce cholesterol through diet/weight management and physical activity.       A. How a diet high in plant sterols (fruits/vegetables/nuts/whole grains/legumes) may reduce your cholesterol.         B. Encouraged increasing fiber to a daily intake of 10-25g/day  . Discussed non-pharmacological interventions for acid reflux. Take measures to prevent acid reflux, such as avoiding spicy foods, avoiding caffeine, avoid laying down a few hours after eating, and raising the head of the bed. . Discussed non-pharmacological interventions for insomnia. Avoid napping, limit exposure to technology near bedtime, etc).  Patient Self Care Activities:  . Calls provider office for new concerns or questions . Continue current medications as directed by providers.  . Continue following up with specialists. . Continue with lifestyle modifications (diet and exercise).   Initial goal documentation       Depression Screen PHQ 2/9 Scores 11/19/2020 02/29/2020 02/28/2019 02/23/2018 02/16/2017 02/16/2017 02/27/2015  PHQ - 2 Score 0 0 0 0 0 0 0  PHQ- 9 Score 0 - - - - - -    Fall Risk Fall Risk  11/19/2020 02/29/2020 02/28/2019 02/23/2018 02/16/2017  Falls in the past year? 0 0 0 No No  Number falls in past yr: 0 0 - - -  Injury with Fall? 0 0 - - -    Follow up Falls evaluation completed;Falls prevention discussed - - - -    FALL RISK PREVENTION PERTAINING TO THE HOME:  Any stairs in or around the home? Yes  If so, are there any without handrails? No  Home free of loose throw rugs in walkways, pet beds, electrical cords, etc? Yes  Adequate lighting in your home to reduce risk of falls? Yes   ASSISTIVE DEVICES UTILIZED TO PREVENT FALLS:  Life alert? No  Use of a cane, walker or w/c? No  Grab bars in the bathroom? No  Shower chair or bench in shower? No  Elevated toilet seat or a handicapped toilet? No    Cognitive Function: MMSE - Mini Mental State Exam 02/23/2018 02/16/2017  Not completed: (No Data) (No Data)     6CIT Screen 11/19/2020  What Year? 0 points  What month? 0 points  What time? 0 points  Count back from 20 0 points  Months in reverse 0 points  Repeat phrase 0 points  Total Score 0    Immunizations Immunization History  Administered Date(s) Administered  . Fluad Quad(high Dose 65+) 08/25/2019  . Influenza Split 09/13/2013  . Influenza, High Dose Seasonal PF 10/06/2016, 09/16/2017, 08/17/2018  . Influenza-Unspecified 09/07/2014, 09/14/2015, 09/13/2020  . PFIZER SARS-COV-2 Vaccination 02/12/2020, 03/12/2020  . Pneumococcal Conjugate-13 02/16/2017  . Pneumococcal Polysaccharide-23 05/26/2013  . Td 12/15/2007  . Tdap 02/23/2018  . Zoster 02/19/2014  . Zoster Recombinat (Shingrix) 10/13/2018, 07/25/2019    TDAP status: Up to date  Flu Vaccine status: Due, Education has been provided regarding the importance of this vaccine. Advised may receive this vaccine at local pharmacy or Health Dept. Aware to  provide a copy of the vaccination record if obtained from local pharmacy or Health Dept. Verbalized acceptance and understanding.  Pneumococcal vaccine status: Up to date  Covid-19 vaccine status: Completed vaccines  Qualifies for Shingles Vaccine? Yes   Zostavax completed Yes   Shingrix Completed?:  Yes  Screening Tests Health Maintenance  Topic Date Due  . Hepatitis C Screening  Never done  . COLONOSCOPY  03/01/2022  . TETANUS/TDAP  02/24/2028  . INFLUENZA VACCINE  Completed  . COVID-19 Vaccine  Completed  . PNA vac Low Risk Adult  Completed    Health Maintenance  Health Maintenance Due  Topic Date Due  . Hepatitis C Screening  Never done    Colorectal cancer screening: Type of screening: Colonoscopy. Completed 03/01/2017. Repeat every 5 years  Lung Cancer Screening: (Low Dose CT Chest recommended if Age 34-80 years, 30 pack-year currently smoking OR have quit w/in 15years.) does not qualify.   Lung Cancer Screening Referral: N/A   Additional Screening:  Hepatitis C Screening: does qualify;   Vision Screening: Recommended annual ophthalmology exams for early detection of glaucoma and other disorders of the eye. Is the patient up to date with their annual eye exam?  Yes  Who is the provider or what is the name of the office in which the patient attends annual eye exams? [facility]  If pt is not established with a provider, would they like to be referred to a provider to establish care? No .   Dental Screening: Recommended annual dental exams for proper oral hygiene  Community Resource Referral / Chronic Care Management: CRR required this visit?  No   CCM required this visit?  No      Plan:     I have personally reviewed and noted the following in the patient's chart:   . Medical and social history . Use of alcohol, tobacco or illicit drugs  . Current medications and supplements . Functional ability and status . Nutritional status . Physical activity . Advanced directives . List of other physicians . Hospitalizations, surgeries, and ER visits in previous 12 months . Vitals . Screenings to include cognitive, depression, and falls . Referrals and appointments  In addition, I have reviewed and discussed with patient certain preventive  protocols, quality metrics, and best practice recommendations. A written personalized care plan for preventive services as well as general preventive health recommendations were provided to patient.     Ofilia Neas, LPN   14/02/8886   Nurse Notes: None

## 2020-11-19 ENCOUNTER — Ambulatory Visit (INDEPENDENT_AMBULATORY_CARE_PROVIDER_SITE_OTHER): Payer: PPO

## 2020-11-19 DIAGNOSIS — Z0000 Encounter for general adult medical examination without abnormal findings: Secondary | ICD-10-CM | POA: Diagnosis not present

## 2020-11-19 NOTE — Patient Instructions (Signed)
Tanner Bishop , Thank you for taking time to come for your Medicare Wellness Visit. I appreciate your ongoing commitment to your health goals. Please review the following plan we discussed and let me know if I can assist you in the future.   Screening recommendations/referrals: Colonoscopy: Up to date, next due 03/01/2022 Recommended yearly ophthalmology/optometry visit for glaucoma screening and checkup Recommended yearly dental visit for hygiene and checkup  Vaccinations: Influenza vaccine: Up to date, next due fall 2022  Pneumococcal vaccine: Completed series  Tdap vaccine: Up to date, next due 02/24/2028 Shingles vaccine: Completed series     Advanced directives: Please bring a copy of your advanced medical directives into our office so that we may scan it into your chart.  Conditions/risks identified: None   Next appointment: 02/07/2020 @ 9:00 am with Pharmacist via telephone  Preventive Care 65 Years and Older, Male Preventive care refers to lifestyle choices and visits with your health care provider that can promote health and wellness. What does preventive care include?  A yearly physical exam. This is also called an annual well check.  Dental exams once or twice a year.  Routine eye exams. Ask your health care provider how often you should have your eyes checked.  Personal lifestyle choices, including:  Daily care of your teeth and gums.  Regular physical activity.  Eating a healthy diet.  Avoiding tobacco and drug use.  Limiting alcohol use.  Practicing safe sex.  Taking low doses of aspirin every day.  Taking vitamin and mineral supplements as recommended by your health care provider. What happens during an annual well check? The services and screenings done by your health care provider during your annual well check will depend on your age, overall health, lifestyle risk factors, and family history of disease. Counseling  Your health care provider may ask  you questions about your:  Alcohol use.  Tobacco use.  Drug use.  Emotional well-being.  Home and relationship well-being.  Sexual activity.  Eating habits.  History of falls.  Memory and ability to understand (cognition).  Work and work Statistician. Screening  You may have the following tests or measurements:  Height, weight, and BMI.  Blood pressure.  Lipid and cholesterol levels. These may be checked every 5 years, or more frequently if you are over 9 years old.  Skin check.  Lung cancer screening. You may have this screening every year starting at age 82 if you have a 30-pack-year history of smoking and currently smoke or have quit within the past 15 years.  Fecal occult blood test (FOBT) of the stool. You may have this test every year starting at age 52.  Flexible sigmoidoscopy or colonoscopy. You may have a sigmoidoscopy every 5 years or a colonoscopy every 10 years starting at age 31.  Prostate cancer screening. Recommendations will vary depending on your family history and other risks.  Hepatitis C blood test.  Hepatitis B blood test.  Sexually transmitted disease (STD) testing.  Diabetes screening. This is done by checking your blood sugar (glucose) after you have not eaten for a while (fasting). You may have this done every 1-3 years.  Abdominal aortic aneurysm (AAA) screening. You may need this if you are a current or former smoker.  Osteoporosis. You may be screened starting at age 42 if you are at high risk. Talk with your health care provider about your test results, treatment options, and if necessary, the need for more tests. Vaccines  Your health care provider may  recommend certain vaccines, such as:  Influenza vaccine. This is recommended every year.  Tetanus, diphtheria, and acellular pertussis (Tdap, Td) vaccine. You may need a Td booster every 10 years.  Zoster vaccine. You may need this after age 26.  Pneumococcal 13-valent conjugate  (PCV13) vaccine. One dose is recommended after age 59.  Pneumococcal polysaccharide (PPSV23) vaccine. One dose is recommended after age 61. Talk to your health care provider about which screenings and vaccines you need and how often you need them. This information is not intended to replace advice given to you by your health care provider. Make sure you discuss any questions you have with your health care provider. Document Released: 12/27/2015 Document Revised: 08/19/2016 Document Reviewed: 10/01/2015 Elsevier Interactive Patient Education  2017 [place] Prevention in the Home Falls can cause injuries. They can happen to people of all ages. There are many things you can do to make your home safe and to help prevent falls. What can I do on the outside of my home?  Regularly fix the edges of walkways and driveways and fix any cracks.  Remove anything that might make you trip as you walk through a door, such as a raised step or threshold.  Trim any bushes or trees on the path to your home.  Use bright outdoor lighting.  Clear any walking paths of anything that might make someone trip, such as rocks or tools.  Regularly check to see if handrails are loose or broken. Make sure that both sides of any steps have handrails.  Any raised decks and porches should have guardrails on the edges.  Have any leaves, snow, or ice cleared regularly.  Use sand or salt on walking paths during winter.  Clean up any spills in your garage right away. This includes oil or grease spills. What can I do in the bathroom?  Use night lights.  Install grab bars by the toilet and in the tub and shower. Do not use towel bars as grab bars.  Use non-skid mats or decals in the tub or shower.  If you need to sit down in the shower, use a plastic, non-slip stool.  Keep the floor dry. Clean up any water that spills on the floor as soon as it happens.  Remove soap buildup in the tub or shower  regularly.  Attach bath mats securely with double-sided non-slip rug tape.  Do not have throw rugs and other things on the floor that can make you trip. What can I do in the bedroom?  Use night lights.  Make sure that you have a light by your bed that is easy to reach.  Do not use any sheets or blankets that are too big for your bed. They should not hang down onto the floor.  Have a firm chair that has side arms. You can use this for support while you get dressed.  Do not have throw rugs and other things on the floor that can make you trip. What can I do in the kitchen?  Clean up any spills right away.  Avoid walking on wet floors.  Keep items that you use a lot in easy-to-reach places.  If you need to reach something above you, use a strong step stool that has a grab bar.  Keep electrical cords out of the way.  Do not use floor polish or wax that makes floors slippery. If you must use wax, use non-skid floor wax.  Do not have throw rugs and  other things on the floor that can make you trip. What can I do with my stairs?  Do not leave any items on the stairs.  Make sure that there are handrails on both sides of the stairs and use them. Fix handrails that are broken or loose. Make sure that handrails are as long as the stairways.  Check any carpeting to make sure that it is firmly attached to the stairs. Fix any carpet that is loose or worn.  Avoid having throw rugs at the top or bottom of the stairs. If you do have throw rugs, attach them to the floor with carpet tape.  Make sure that you have a light switch at the top of the stairs and the bottom of the stairs. If you do not have them, ask someone to add them for you. What else can I do to help prevent falls?  Wear shoes that:  Do not have high heels.  Have rubber bottoms.  Are comfortable and fit you well.  Are closed at the toe. Do not wear sandals.  If you use a stepladder:  Make sure that it is fully  opened. Do not climb a closed stepladder.  Make sure that both sides of the stepladder are locked into place.  Ask someone to hold it for you, if possible.  Clearly mark and make sure that you can see:  Any grab bars or handrails.  First and last steps.  Where the edge of each step is.  Use tools that help you move around (mobility aids) if they are needed. These include:  Canes.  Walkers.  Scooters.  Crutches.  Turn on the lights when you go into a dark area. Replace any light bulbs as soon as they burn out.  Set up your furniture so you have a clear path. Avoid moving your furniture around.  If any of your floors are uneven, fix them.  If there are any pets around you, be aware of where they are.  Review your medicines with your doctor. Some medicines can make you feel dizzy. This can increase your chance of falling. Ask your doctor what other things that you can do to help prevent falls. This information is not intended to replace advice given to you by your health care provider. Make sure you discuss any questions you have with your health care provider. Document Released: 09/26/2009 Document Revised: 05/07/2016 Document Reviewed: 01/04/2015 Elsevier Interactive Patient Education  2017 [employer].

## 2020-11-20 ENCOUNTER — Other Ambulatory Visit: Payer: Self-pay | Admitting: Internal Medicine

## 2020-11-22 ENCOUNTER — Other Ambulatory Visit: Payer: Self-pay | Admitting: Family Medicine

## 2020-11-27 ENCOUNTER — Ambulatory Visit: Payer: PPO | Admitting: Primary Care

## 2020-11-27 ENCOUNTER — Other Ambulatory Visit (INDEPENDENT_AMBULATORY_CARE_PROVIDER_SITE_OTHER): Payer: PPO

## 2020-11-27 ENCOUNTER — Telehealth: Payer: Self-pay | Admitting: Internal Medicine

## 2020-11-27 DIAGNOSIS — J84112 Idiopathic pulmonary fibrosis: Secondary | ICD-10-CM | POA: Diagnosis not present

## 2020-11-27 DIAGNOSIS — R748 Abnormal levels of other serum enzymes: Secondary | ICD-10-CM

## 2020-11-27 DIAGNOSIS — Z5181 Encounter for therapeutic drug level monitoring: Secondary | ICD-10-CM

## 2020-11-27 LAB — HEPATIC FUNCTION PANEL
ALT: 12 U/L (ref 0–53)
AST: 18 U/L (ref 0–37)
Albumin: 3.9 g/dL (ref 3.5–5.2)
Alkaline Phosphatase: 43 U/L (ref 39–117)
Bilirubin, Direct: 0.1 mg/dL (ref 0.0–0.3)
Total Bilirubin: 0.4 mg/dL (ref 0.2–1.2)
Total Protein: 6.9 g/dL (ref 6.0–8.3)

## 2020-11-27 LAB — AMYLASE: Amylase: 346 U/L — ABNORMAL HIGH (ref 27–131)

## 2020-11-27 NOTE — Telephone Encounter (Signed)
LFT normal but amylase still high in 300s  Plan  - hold ofev x 2 weeks - recheck Amylase and Lipase in 2 weeks - can be after xmas

## 2020-11-28 NOTE — Telephone Encounter (Signed)
Called and spoke with pt letting him know the results of labwork and that per MR, pt is to hold OFEV x2 weeks and then recheck labs in 2 weeks. Pt verbalized understanding. Nothing further needed.

## 2020-12-10 ENCOUNTER — Other Ambulatory Visit: Payer: Self-pay | Admitting: Family Medicine

## 2020-12-10 NOTE — Telephone Encounter (Signed)
Last OV 11-19-20 Last refill 10-23-2019  Please advise

## 2020-12-11 ENCOUNTER — Other Ambulatory Visit (INDEPENDENT_AMBULATORY_CARE_PROVIDER_SITE_OTHER): Payer: PPO

## 2020-12-11 DIAGNOSIS — J84112 Idiopathic pulmonary fibrosis: Secondary | ICD-10-CM | POA: Diagnosis not present

## 2020-12-11 DIAGNOSIS — R748 Abnormal levels of other serum enzymes: Secondary | ICD-10-CM

## 2020-12-11 DIAGNOSIS — Z5181 Encounter for therapeutic drug level monitoring: Secondary | ICD-10-CM

## 2020-12-11 LAB — LIPASE: Lipase: 18 U/L (ref 11.0–59.0)

## 2020-12-11 LAB — AMYLASE: Amylase: 341 U/L — ABNORMAL HIGH (ref 27–131)

## 2020-12-16 ENCOUNTER — Telehealth: Payer: Self-pay | Admitting: Internal Medicine

## 2020-12-16 NOTE — Telephone Encounter (Signed)
Spoke with patient. He stated that he was requesting his lab results from 12/11/20. I advised him that I would send a message to MR for him, he verbalized understanding.   MR, please advise on his bloodwork from 12/29. Thanks!

## 2020-12-19 NOTE — Telephone Encounter (Signed)
Pt calling back - wants to know why it has taken over a week for someone to call back - he is concerned about taking his pills - [phone]

## 2020-12-19 NOTE — Telephone Encounter (Signed)
lmtcb for pt.  See last phone message. Pt has been holding his ofev and was to repeat labs.

## 2020-12-20 ENCOUNTER — Encounter: Payer: Self-pay | Admitting: Family Medicine

## 2020-12-20 ENCOUNTER — Telehealth (INDEPENDENT_AMBULATORY_CARE_PROVIDER_SITE_OTHER): Payer: PPO | Admitting: Family Medicine

## 2020-12-20 ENCOUNTER — Other Ambulatory Visit: Payer: Self-pay

## 2020-12-20 VITALS — Wt 161.0 lb

## 2020-12-20 DIAGNOSIS — G8929 Other chronic pain: Secondary | ICD-10-CM

## 2020-12-20 DIAGNOSIS — M5442 Lumbago with sciatica, left side: Secondary | ICD-10-CM

## 2020-12-20 MED ORDER — GABAPENTIN 300 MG PO CAPS: 300.0000 mg | ORAL_CAPSULE | Freq: Three times a day (TID) | ORAL | 5 refills | Status: DC

## 2020-12-20 NOTE — Telephone Encounter (Signed)
Lm for patient.  

## 2020-12-20 NOTE — Progress Notes (Signed)
   Subjective:    Patient ID: Tanner Bishop, male    DOB: March 25, 1946, 75 y.o.   MRN: [id number]  HPI Virtual Visit via Telephone Note  I connected with the patient on 12/20/20 at  2:00 PM EST by telephone and verified that I am speaking with the correct person using two identifiers.   I discussed the limitations, risks, security and privacy concerns of performing an evaluation and management service by telephone and the availability of in person appointments. I also discussed with the patient that there may be a patient responsible charge related to this service. The patient expressed understanding and agreed to proceed.  Location patient: home Location provider: work or home office Participants present for the call: patient, provider Patient did not have a visit in the prior 7 days to address this/these issue(s).   History of Present Illness: Here for pain in the left lower back that radiates down the left leg. He saw Dr. Elenor Legato in [place], and he told him that this was not a surgical issue. He then referred him to see Dr. Brien Few who gave him 2 sets of epidural steroid injections. The first one helped for about 3 weeks, but the second one did not help at all. He has tried oral steroids and NSAIDs with no help. Currently taking Gabapentin 100 mg TID.    Observations/Objective: Patient sounds cheerful and well on the phone. I do not appreciate any SOB. Speech and thought processing are grossly intact. Patient reported vitals:  Assessment and Plan: Low back pain with sciatica. We will increase the Gabapentin to 300 mg TID and refer him to PT.  Alysia Penna, MD   Follow Up Instructions:     [phone] 5-10 [phone] 11-20 [id number] 21-30 I did not refer this patient for an OV in the next 24 hours for this/these issue(s).  I discussed the assessment and treatment plan with the patient. The patient was provided an opportunity to ask questions and all were answered. The patient agreed with  the plan and demonstrated an understanding of the instructions.   The patient was advised to call back or seek an in-person evaluation if the symptoms worsen or if the condition fails to improve as anticipated.  I provided 13 minutes of non-face-to-face time during this encounter.   Alysia Penna, MD    Review of Systems     Objective:   Physical Exam        Assessment & Plan:

## 2020-12-20 NOTE — Telephone Encounter (Signed)
He has unexplained high amylase. So I tol;d him to stop ofev and instructions was to check amylapse /liapse off ofev -> please order those labs ASAP and then baed on results I can discuss about restarting ofev or not

## 2020-12-20 NOTE — Telephone Encounter (Signed)
Spoke to patient, who stated that he has repeat labs 9 days ago. He has not taken ofev in over a month.  He is questioning if he should repeat labs again, as he just had amylase checked 3 weeks ago and 9 days ago.  MR, please advise. Thanks

## 2020-12-20 NOTE — Telephone Encounter (Signed)
Pt returning missed call. [phone]

## 2020-12-20 NOTE — Telephone Encounter (Signed)
Thanks . Elevated Amylase is NOt due to ofev. I do not know what is from but seems is Abdomonal MRI was fine  Plan  - ok to restart ofev

## 2020-12-20 NOTE — Telephone Encounter (Signed)
Patient is aware of below results/recommendations. He voiced his understanding and had no further questions.  Nothing further needed.

## 2021-01-20 ENCOUNTER — Telehealth: Payer: Self-pay | Admitting: Internal Medicine

## 2021-01-20 NOTE — Telephone Encounter (Addendum)
Patient feels like he is in atrial fibrillation. Heart rate ~100 is feeling palpitations and having shortness of breath with activity. Feels like he can't do anything because of this. Oxygen is 98 % with pulse ox. Blood pressure is 151/112  The patient has not missed any doses of Flecainide. ED precautions given.   Will forward to Dr Lovena Le and his covering Lelon Frohlich for advisement.

## 2021-01-20 NOTE — Telephone Encounter (Signed)
Pt c/o Shortness Of Breath: STAT if SOB developed within the last 24 hours or pt is noticeably SOB on the phone  1. Are you currently SOB (can you hear that pt is SOB on the phone)? Yes  2. How long have you been experiencing SOB? Since 01/18/21  3. Are you SOB when sitting or when up moving around? When up and moving around  4. Are you currently experiencing any other symptoms? Irregular HR   Patient c/o Palpitations:  High priority if patient c/o lightheadedness, shortness of breath, or chest pain  1) How long have you had palpitations/irregular HR/ Afib? Are you having the symptoms now? Patient states he got a notification that his HR was irregular on 01/18/21 and it is currently still irregular.  2) Are you currently experiencing lightheadedness, SOB or CP? SOB  3) Do you have a history of afib (atrial fibrillation) or irregular heart rhythm? Yes  4) Have you checked your BP or HR? (document readings if available): No reading available.  5) Are you experiencing any other symptoms? No

## 2021-01-21 NOTE — Telephone Encounter (Signed)
Patient is feeling about the same. Current blood pressure is 142/81, heart rate is 80 but still looks irregular. He is still feeling palpitations and shortness of breath. Moved patients appointment to 2/9 at 1:45 pm with Tommye Standard from 2/10. Gave ED precaution.  Patient agreeable and verbalized understanding.

## 2021-01-21 NOTE — Progress Notes (Deleted)
Cardiology Office Note Date:  01/21/2021  Patient ID:  Tanner Bishop, Tanner Bishop 01/26/1946, MRN [id number] PCP:  [name], MD  Cardiologist/Electrophysiologist: [name] Pulmonary: [name]  ***refresh   Chief Complaint: ***  Palpitations, SOB  History of Present Illness: Tanner Bishop is a 75 y.o. male with history of AFlutter (ablated 2016), AFib, pulmonary fibrosis, GERD, HLD.  He comes in today to be seen for [name], last seen by him Sept 2021.  At that time mentioned no longer using O2 at HS.  Afib felt well controlled with his flecainide.  Telephone note from 01/20/21 with c/o palpitations and SOB, placed on my schedule.   *** symptoms *** flecainide ekg *** nodal blocker *** not on a/c, risk score is one *** labs, lipids... *** no echo in epic?    Past Medical History:  Diagnosis Date  . Allergy   . Arthritis   . Atrial flutter ([place])    a. s/p ablation 03/2015 Dr Lovena Le  . Cataract    removed both eyes  . GERD (gastroesophageal reflux disease)   . H/O hiatal hernia   . Hyperlipidemia   . Injury of right hand    permanent damage after workplace 2009 and 2010 Dr Vernona Rieger [facility]  . Pulmonary fibrosis ([place])    sees Dr. Onnie Graham   . Renal stone 06/2014  . Ulcer    unresolved    Past Surgical History:  Procedure Laterality Date  . ATRIAL FLUTTER ABLATION N/A 04/08/2015   Procedure: ATRIAL FLUTTER ABLATION;  Surgeon: Evans Lance, MD;  Location: [facility] CATH LAB;  Service: Cardiovascular;  Laterality: N/A;  . CATARACT EXTRACTION, BILATERAL  2016  . COLONOSCOPY  03/01/2017   per Dr. Loletha Carrow, clear, repeat in 5 yrs (brother had colon cancer)   . ESOPHAGOGASTRODUODENOSCOPY  2007  . EXTRACORPOREAL SHOCK WAVE LITHOTRIPSY  06-17-14   per Dr. Jeffie Pollock   . HAND SURGERY Right   . LUNG BIOPSY Left 03/22/2013   Procedure: LUNG BIOPSY;  Surgeon: Melrose Nakayama, MD;  Location: [facility];  Service: Thoracic;  Laterality: Left;  . POLYPECTOMY    . UPPER  GASTROINTESTINAL ENDOSCOPY    . VIDEO ASSISTED THORACOSCOPY Left 03/22/2013   Procedure: VIDEO ASSISTED THORACOSCOPY;  Surgeon: Melrose Nakayama, MD;  Location: [facility] OR;  Service: Thoracic;  Laterality: Left;    Current Outpatient Medications  Medication Sig Dispense Refill  . chlorpheniramine-HYDROcodone (TUSSIONEX PENNKINETIC ER) 10-8 MG/5ML SUER Take 5 mLs by mouth 2 (two) times daily as needed for cough (Do not take with other sedating medication). 140 mL 0  . diphenhydrAMINE (BENADRYL) 25 MG tablet Take 25 mg by mouth every 6 (six) hours as needed for allergies or sleep. Patient reports taking at bedtime    . flecainide (TAMBOCOR) 50 MG tablet Take 1 tablet (50 mg total) by mouth 3 (three) times daily. 270 tablet 3  . fluticasone (FLONASE) 50 MCG/ACT nasal spray SPRAY 2 SPRAYS INTO EACH NOSTRIL EVERY DAY 48 mL 5  . gabapentin (NEURONTIN) 300 MG capsule Take 1 capsule (300 mg total) by mouth 3 (three) times daily. 90 capsule 5  . OFEV 150 MG CAPS TAKE 1 CAPSULE BY MOUTH TWICE DAILY WITH FOOD. 60 capsule 10  . pantoprazole (PROTONIX) 40 MG tablet Take 1 tablet (40 mg total) by mouth 2 (two) times daily. 180 tablet 3  . predniSONE (DELTASONE) 5 MG tablet TAKE 1 TABLET BY MOUTH EVERY DAY WITH BREAKFAST 30 tablet 3  . zolpidem (AMBIEN)  5 MG tablet TAKE 1 TABLET BY MOUTH EVERY DAY AT BEDTIME AS NEEDED FOR SLEEP 30 tablet 5   Current Facility-Administered Medications  Medication Dose Route Frequency Provider Last Rate Last Admin  . 0.9 %  sodium chloride infusion  500 mL Intravenous Continuous Danis, Kirke Corin, MD        Allergies:   Patient has no known allergies.   Social History:  The patient  reports that he quit smoking about 42 years ago. His smoking use included cigarettes. He has a 16.00 pack-year smoking history. He has never used smokeless tobacco. He reports that he does not drink alcohol and does not use drugs.   Family History:  The patient's family history includes Cancer in  an other family member; Colon cancer in his brother; Diabetes in his brother, sister, and another family member; Heart disease in his brother; Liver cancer in his mother; Lung cancer in his sister.* ROS:  Please see the history of present illness.    All other systems are reviewed and otherwise negative.   PHYSICAL EXAM:  VS:  There were no vitals taken for this visit. BMI: There is no height or weight on file to calculate BMI. Well nourished, well developed, in no acute distress HEENT: normocephalic, atraumatic Neck: no JVD, carotid bruits or masses Cardiac:  *** RRR; no significant murmurs, no rubs, or gallops Lungs:  *** CTA b/l, no wheezing, rhonchi or rales Abd: soft, nontender MS: no deformity or *** atrophy Ext: *** no edema Skin: warm and dry, no rash Neuro:  No gross deficits appreciated Psych: euthymic mood, full affect    EKG:  Done today and reviewed by myself shows  ***    Recent Labs: 02/29/2020: Hemoglobin 14.5; Platelets 301.0; TSH 2.90 08/05/2020: BUN 19; Creat 1.19; Potassium 4.6; Sodium 138 11/27/2020: ALT 12  02/29/2020: Cholesterol 196; HDL 59.10; LDL Cholesterol 121; Total CHOL/HDL Ratio 3; Triglycerides 77.0; VLDL 15.4   CrCl cannot be calculated (Patient's most recent lab result is older than the maximum 21 days allowed.).   Wt Readings from Last 3 Encounters:  12/20/20 161 lb (73 kg)  11/12/20 169 lb 6.4 oz (76.8 kg)  08/29/20 162 lb (73.5 kg)     Other studies reviewed: Additional studies/records reviewed today include: summarized above  ASSESSMENT AND PLAN:  1. Paroxysmal Afib     CHA2DS2Vasc is one, ot on a/c     *** flecainide    Disposition: F/u with ***  Current medicines are reviewed at length with the patient today.  The patient did not have any concerns regarding medicines.  Venetia Night, PA-C 01/21/2021 1:17 PM     [place] [address] [phone] (office)  [phone] (fax)

## 2021-01-22 ENCOUNTER — Ambulatory Visit
Admission: RE | Admit: 2021-01-22 | Discharge: 2021-01-22 | Disposition: A | Discharge status: Home / Self Care | Payer: PPO | Source: Ambulatory Visit | Attending: Physician Assistant | Admitting: Physician Assistant

## 2021-01-22 ENCOUNTER — Other Ambulatory Visit: Payer: Self-pay

## 2021-01-22 ENCOUNTER — Telehealth: Payer: Self-pay | Admitting: Internal Medicine

## 2021-01-22 ENCOUNTER — Encounter: Payer: Self-pay | Admitting: Physician Assistant

## 2021-01-22 ENCOUNTER — Ambulatory Visit (INDEPENDENT_AMBULATORY_CARE_PROVIDER_SITE_OTHER): Payer: PPO

## 2021-01-22 ENCOUNTER — Ambulatory Visit: Payer: PPO | Attending: Family Medicine

## 2021-01-22 ENCOUNTER — Ambulatory Visit: Payer: PPO | Admitting: Physician Assistant

## 2021-01-22 ENCOUNTER — Encounter: Payer: Self-pay | Admitting: *Deleted

## 2021-01-22 VITALS — BP 126/70 | HR 80 | Ht 67.0 in | Wt 172.0 lb

## 2021-01-22 DIAGNOSIS — R0602 Shortness of breath: Secondary | ICD-10-CM

## 2021-01-22 DIAGNOSIS — R55 Syncope and collapse: Secondary | ICD-10-CM

## 2021-01-22 DIAGNOSIS — G8929 Other chronic pain: Secondary | ICD-10-CM | POA: Diagnosis not present

## 2021-01-22 DIAGNOSIS — M5386 Other specified dorsopathies, lumbar region: Secondary | ICD-10-CM | POA: Insufficient documentation

## 2021-01-22 DIAGNOSIS — I4510 Unspecified right bundle-branch block: Secondary | ICD-10-CM

## 2021-01-22 DIAGNOSIS — R262 Difficulty in walking, not elsewhere classified: Secondary | ICD-10-CM | POA: Diagnosis not present

## 2021-01-22 DIAGNOSIS — M5442 Lumbago with sciatica, left side: Secondary | ICD-10-CM | POA: Insufficient documentation

## 2021-01-22 DIAGNOSIS — R002 Palpitations: Secondary | ICD-10-CM

## 2021-01-22 DIAGNOSIS — Z5181 Encounter for therapeutic drug level monitoring: Secondary | ICD-10-CM

## 2021-01-22 DIAGNOSIS — J84112 Idiopathic pulmonary fibrosis: Secondary | ICD-10-CM

## 2021-01-22 DIAGNOSIS — Z79899 Other long term (current) drug therapy: Secondary | ICD-10-CM | POA: Diagnosis not present

## 2021-01-22 LAB — CBC
Hematocrit: 45.4 % (ref 37.5–51.0)
Hemoglobin: 15.2 g/dL (ref 13.0–17.7)
MCH: 31.7 pg (ref 26.6–33.0)
MCHC: 33.5 g/dL (ref 31.5–35.7)
MCV: 95 fL (ref 79–97)
Platelets: 231 10*3/uL (ref 150–450)
RBC: 4.79 x10E6/uL (ref 4.14–5.80)
RDW: 13.7 % (ref 11.6–15.4)
WBC: 9.1 10*3/uL (ref 3.4–10.8)

## 2021-01-22 LAB — D-DIMER, QUANTITATIVE: D-DIMER: <0.2 mg/L FEU (ref 0.00–0.49)

## 2021-01-22 LAB — TROPONIN T: Troponin T (Highly Sensitive): 13 ng/L (ref 0–22)

## 2021-01-22 NOTE — Progress Notes (Signed)
Patient ID: Tanner Bishop, male   DOB: 1946/09/30, 75 y.o.   MRN: [id number] Patient enrolled for [employer] to ship a 14 day ZIO AT long term monitor-Live Telemetry to his home.

## 2021-01-22 NOTE — Telephone Encounter (Signed)
Called and spoke with pt and he stated that last Friday he started having SHOB and chest tightness and he felt like his heart was out of rhythm. He stated that he called Monday and got an appt with cardiology today and they stated that his heart is not out of rhythm.  They told him to follow up with MR due to the [facility].  Pt stated that his oxygen levels are dropping to 92% and he said that his [facility] came on all of a sudden.  He denies any cough, fever, chills or any other symptoms. MR please advise. Thanks

## 2021-01-22 NOTE — Patient Instructions (Addendum)
Medication Instructions:   Your physician recommends that you continue on your current medications as directed. Please refer to the Current Medication list given to you today.  *If you need a refill on your cardiac medications before your next appointment, please call your pharmacy*   Lab Work:  BMET BNP [place]   If you have labs (blood work) drawn today and your tests are completely normal, you will receive your results only by: Marland Kitchen MyChart Message (if you have MyChart) OR . A paper copy in the mail If you have any lab test that is abnormal or we need to change your treatment, we will call you to review the results.   Testing/Procedures: Your physician has requested that you have an echocardiogram. Echocardiography is a painless test that uses sound waves to create images of your heart. It provides your doctor with information about the size and shape of your heart and how well your heart's chambers and valves are working. This procedure takes approximately one hour. There are no restrictions for this procedure.  Your physician has recommended that you wear an event monitor. Event monitors are medical devices that record the heart's electrical activity. Doctors most often [place] these monitors to diagnose arrhythmias. Arrhythmias are problems with the speed or rhythm of the heartbeat. The monitor is a small, portable device. You can wear one while you do your normal daily activities. This is usually used to diagnose what is causing palpitations/syncope (passing out).   A chest x-ray takes a picture of the organs and structures inside the chest, including the heart, lungs, and blood vessels. This test can show several things, including, whether the heart is enlarges; whether fluid is building up in the lungs; and whether pacemaker / defibrillator leads are still in place.  [facility]: Address: [address], [place], [place] [address] Hours:   Wednesday 8AM-5PM Thursday 8AM-5PM Friday 8AM-5PM Saturday Closed Sunday Closed Monday 8AM-5PM Tuesday 8AM-5PM Phone: [phone]   Follow-Up: At [facility], you and your health needs are our priority.  As part of our continuing mission to provide you with exceptional heart care, we have created designated Provider Care Teams.  These Care Teams include your primary Cardiologist (physician) and Advanced Practice Providers (APPs -  Physician Assistants and Nurse Practitioners) who all work together to provide you with the care you need, when you need it.  We recommend signing up for the patient portal called "MyChart".  Sign up information is provided on this After Visit Summary.  MyChart is used to connect with patients for Virtual Visits (Telemedicine).  Patients are able to view lab/test results, encounter notes, upcoming appointments, etc.  Non-urgent messages can be sent to your provider as well.   To learn more about what you can do with MyChart, go to [email or web address].    Your next appointment:   2-3  week(s)  The format for your next appointment:   In Person  Provider:   You will see one of the following Advanced Practice Providers on your designated Care Team:    Chanetta Marshall, NP  Legrand Como "Jonni Sanger" Chalmers Cater, [place]  Other Instructions  PLEASE CONTACT PULMONOLOGIST TODAY   PLEASE GO TO [place] IF OXYGEN IS LOWER THAN 90%   ZIO AT Long term monitor-Live Telemetry  Your physician has requested you wear a ZIO patch monitor for  14 __ days.  This is a single patch monitor. [employer] supplies one patch monitor per enrollment. Additional stickers are  not available.  Please do not apply patch if you will be having a Nuclear Stress Test, Echocardiogram, Cardiac CT, MRI, or Chest Xray during the time frame you would be wearing the monitor. The patch cannot be worn during these tests. You cannot remove and re-apply the ZIO AT patch monitor.   Your ZIO patch monitor  will be sent Fed Ex from [employer] directly to your home address. The monitor may also be mailed to a PO BOX if home delivery is not available. It may take 3-5 days to receive your monitor after you have been enrolled.  Once you have received you monitor, please review enclosed instructions. Your monitor has already been registered assigning a specific monitor serial # to you.   Applying the monitor  Shave hair from upper left chest.  Hold abrader disc by orange tab. Rub abrader in 40 strokes over left upper chest as indicated in your monitor instructions.  Clean area with 4 enclosed alcohol pads. Use all pads to ensure the area is cleaned thoroughly. Let dry.  Apply patch as indicated in monitor instructions. Patch will be placed under collarbone on left side of chest with arrow pointing upward.  Rub patch adhesive wings for 2 minutes. Remove the white label marked "1". Remove the white label marked "2". Rub patch adhesive wings for 2 additional minutes.  While looking in a mirror, press and release button in center of patch. A small green light will flash 3-4 times. This will be your only indicator the monitor has been turned on.  Do not shower for the first 24 hours. You may shower after the first 24 hours.  Press the button if you feel a symptom. You will hear a small click. Record Date, Time and Symptom in the Patient Log.   Starting the Gateway  In your kit there is a Hydrographic surveyor box the size of a cellphone. This is Airline pilot. It transmits all your recorded data to [facility]. This box must stay within 10 feet of you at all times. Open the box and push the * button. There will be a light that blinks orange and then green a few times. When the light stops blinking, the Gateway is connected to the ZIO patch.  Call [employer] at [phone] to confirm your monitor is transmitting.   Returning your monitor  Remove your patch and place it inside the [place]. In the lower half of the  Gateway there is a white bag with prepaid postage on it. Place Gateway in bag and seal. Mail package back to [place] as soon as possible. Your physician should have your final report approximately 7 days after you have mailed back your monitor.   Call [place] at [phone] if you have questions regarding your ZIO AT patch monitor. Call them immediately if you see an orange light blinking on your monitor.  If your monitor falls off in less than 4 days contact our Monitor department at [phone]. If your monitor becomes loose or falls off after 4 days call [employer] at [phone] for suggestions on securing your monitor.

## 2021-01-22 NOTE — Progress Notes (Signed)
Cardiology Office Note Date:  01/22/2021  Patient ID:  Tanner Bishop, Robar October 11, 1946, MRN [id number] PCP:  [name], MD  Cardiologist/Electrophysiologist: [name] Pulmonary: [name]    Chief Complaint: palpitations, SOB  History of Present Illness: Tanner Bishop is a 75 y.o. male with history of pulmonary fibrosis, HLD, GERD, AFlutter (ablated 2016) AFib  He comes in today to be seen for [name], last seen by him Sept 2021.  Mentioned no longer using O2 at HS. Maintaining SR on low dose flecainide.  Pnone notes recently with c/o palpitations and SOB, one reported hypertensive BP reading, rates reported 80's-100.  TODAY He reports until Friday last week he has felt well, at his baseline, walking 42mles on the treadmill in about 1.5hours every day with good exertional capacity, nop CP, palpitations, minimal baseline SOB. Friday evening-Sat developed palpitations, felt "exactly like it did before and was out of rhythm" He feels like the awareness of his heart beat is the primary driver for his feeling poorly, and SOB. He reports that the last time he felt like this he went to [name] sent him to [name] was started on Flecainide.  He denies CP but has an awareness of his heart beat and was certain he was out of rhythm, feels irregular or pronounced heart beat, and can hear his heart beat even. This he feels is the driver for his SOB At home his O2 sats with the symptoms 94-95% and his HR 50's-90's His baseline O2 sats are at rest 96-97% and with exertiona 93-93% Mentions Dr. RRomilda Garrettold him anything 90 or better is OK. He reports never having worn O2 at night or otherwise.  He has ahd some orthostatic lightheadedness, no near syncope or syncope.  He denies cough, congestion, no fever or symptoms of illness Rarely goes out, and has not had any ill contacts, wears N(% masks anytime he is out He has had both vaccines and his booster  He denies  any changes to his PMHx of new diagnosis    AFib Hx atrial flutter diagnosed 2016 > ablated AFib is first mentioned in his chart 2019  AAD hx Flecainide started Sept 2019   Past Medical History:  Diagnosis Date  . Allergy   . Arthritis   . Atrial flutter ([place]    a. s/p ablation 03/2015 Dr TLovena Le . Cataract    removed both eyes  . GERD (gastroesophageal reflux disease)   . H/O hiatal hernia   . Hyperlipidemia   . Injury of right hand    permanent damage after workplace 2009 and [facility]  . Pulmonary fibrosis ([place]    sees Dr. ROnnie Graham  . Renal stone 06/2014  . Ulcer    unresolved    Past Surgical History:  Procedure Laterality Date  . ATRIAL FLUTTER ABLATION N/A 04/08/2015   Procedure: ATRIAL FLUTTER ABLATION;  Surgeon: GEvans Lance MD;  [facility];  Service: Cardiovascular;  Laterality: N/A;  . CATARACT EXTRACTION, BILATERAL  2016  . COLONOSCOPY  03/01/2017   per Dr. DLoletha Carrow clear, repeat in 5 yrs (brother had colon cancer)   . ESOPHAGOGASTRODUODENOSCOPY  2007  . EXTRACORPOREAL SHOCK WAVE LITHOTRIPSY  06-17-14   per Dr. WJeffie Pollock  . HAND SURGERY Right   . LUNG BIOPSY Left 03/22/2013   Procedure: LUNG BIOPSY;  Surgeon: SMelrose Nakayama MD;  [facility]  Service: Thoracic;  Laterality: Left;  .  POLYPECTOMY    . UPPER GASTROINTESTINAL ENDOSCOPY    . VIDEO ASSISTED THORACOSCOPY Left 03/22/2013   Procedure: VIDEO ASSISTED THORACOSCOPY;  Surgeon: Melrose Nakayama, MD;  Location: [facility] OR;  Service: Thoracic;  Laterality: Left;    Current Outpatient Medications  Medication Sig Dispense Refill  . chlorpheniramine-HYDROcodone (TUSSIONEX PENNKINETIC ER) 10-8 MG/5ML SUER Take 5 mLs by mouth 2 (two) times daily as needed for cough (Do not take with other sedating medication). 140 mL 0  . diphenhydrAMINE (BENADRYL) 25 MG tablet Take 25 mg by mouth every 6 (six) hours as needed for allergies or sleep. Patient reports taking at  bedtime    . flecainide (TAMBOCOR) 50 MG tablet Take 1 tablet (50 mg total) by mouth 3 (three) times daily. 270 tablet 3  . fluticasone (FLONASE) 50 MCG/ACT nasal spray SPRAY 2 SPRAYS INTO EACH NOSTRIL EVERY DAY 48 mL 5  . gabapentin (NEURONTIN) 300 MG capsule Take 1 capsule (300 mg total) by mouth 3 (three) times daily. 90 capsule 5  . OFEV 150 MG CAPS TAKE 1 CAPSULE BY MOUTH TWICE DAILY WITH FOOD. 60 capsule 10  . pantoprazole (PROTONIX) 40 MG tablet Take 1 tablet (40 mg total) by mouth 2 (two) times daily. 180 tablet 3  . predniSONE (DELTASONE) 5 MG tablet TAKE 1 TABLET BY MOUTH EVERY DAY WITH BREAKFAST 30 tablet 3  . zolpidem (AMBIEN) 5 MG tablet TAKE 1 TABLET BY MOUTH EVERY DAY AT BEDTIME AS NEEDED FOR SLEEP 30 tablet 5   Current Facility-Administered Medications  Medication Dose Route Frequency Provider Last Rate Last Admin  . 0.9 %  sodium chloride infusion  500 mL Intravenous Continuous Danis, Kirke Corin, MD        Allergies:   Patient has no known allergies.   Social History:  The patient  reports that he quit smoking about 42 years ago. His smoking use included cigarettes. He has a 16.00 pack-year smoking history. He has never used smokeless tobacco. He reports that he does not drink alcohol and does not use drugs.   Family History:  The patient's family history includes Cancer in an other family member; Colon cancer in his brother; Diabetes in his brother, sister, and another family member; Heart disease in his brother; Liver cancer in his mother; Lung cancer in his sister.  ROS:  Please see the history of present illness.    All other systems are reviewed and otherwise negative.   PHYSICAL EXAM:  VS:  BP 126/70 (BP Location: Left Arm, Patient Position: Sitting, Cuff Size: Normal)   Pulse 80   Ht _0  (1.702 m)   Wt 172 lb (78 kg)   SpO2 92%   BMI 26.94 kg/m  BMI: Body mass index is 26.94 kg/m. Well nourished, well developed, in no acute distress HEENT: normocephalic,  atraumatic Neck: no JVD, carotid bruits or masses Cardiac:  RRR; no significant murmurs, no rubs, or gallops Lungs:  very fine crackles throughout no wheezing, rhonchi or rales Abd: soft, nontender MS: no deformity or atrophy Ext: no edema Skin: warm and dry, no rash Neuro:  No gross deficits appreciated Psych: euthymic mood, full affect   EKG:  Done today and reviewed by myself shows  SR 80bpm, 1st degree AVBlock 227m, RBBB, no changes from prior Intervals have been stable since start of flecainide    Recent Labs: 02/29/2020: TSH 2.90 08/05/2020: BUN 19; Creat 1.19; Potassium 4.6; Sodium 138 11/27/2020: ALT 12 01/22/2021: Hemoglobin 15.2; Platelets 231  02/29/2020: Cholesterol  196; HDL 59.10; LDL Cholesterol 121; Total CHOL/HDL Ratio 3; Triglycerides 77.0; VLDL 15.4   CrCl cannot be calculated (Patient's most recent lab result is older than the maximum 21 days allowed.).   Wt Readings from Last 3 Encounters:  01/22/21 172 lb (78 kg)  12/20/20 161 lb (73 kg)  11/12/20 169 lb 6.4 oz (76.8 kg)     Other studies reviewed: Additional studies/records reviewed today include: summarized above  ASSESSMENT AND PLAN:  1. Paroxysmal Afib     CHA2DS2Vasc is one for age, not on a/c     Stable intervals Flecainide     No symptoms of brady  Baseline RBBB, 1st degree AVblock Not on any nodal blocker and would not increase flecainide  2. Palpitations, SOB     He was VERY surprised to hear his rhythm was normal today, actively feeling winded and was certain he was out of rhythm.  I have reviewed the case with Dr. Johney Frame (DOD) and she has seen and examined the patient.  He sats are low and below his baseline and he is winded talking with [place] Can not be certain that he has not been having AFIb, though not felt likely to be etiology of his symptoms Lungs exam c/w his pulm fibrosis hx, exam does not suggest volume OL He was walking 80m daily and have fairly low suspicion fPE, though  symptoms fairly abrupt in onset No CP or acute looking EKG changes extremities are warm, not tachycardic, good BP  He is very resistant to the idea of going to the hospital He reports his symptoms as ongoing, though unchanged and not escalating   Suspect a primary pulmonary issue, no symptoms of illness, sick contacts We have asked that today he reach out to his pulmonologist, he says he will If ongoing or worsening symptoms or if O2 sats <90% needs to go to the ER, he is agreeable Ordered BMET, CBC, BNP, Ddimer and Troponin, CXR, echo, 2 week monitor     He mentioned some orthostatic lightheadedness, instructed to keep aduequately hydrated  Disposition: F/u on a couple weeks, sooner pending labs, echo, findings  Current medicines are reviewed at length with the patient today.  The patient did not have any concerns regarding medicines.  SVenetia Night PA-C 01/22/2021 4:47 PM     [place] [phone](office)  [phone](fax)

## 2021-01-23 ENCOUNTER — Ambulatory Visit: Payer: PPO | Admitting: Physician Assistant

## 2021-01-23 LAB — BASIC METABOLIC PANEL
BUN/Creatinine Ratio: 14 (ref 10–24)
BUN: 18 mg/dL (ref 8–27)
CO2: 22 mmol/L (ref 20–29)
Calcium: 9.3 mg/dL (ref 8.6–10.2)
Chloride: 99 mmol/L (ref 96–106)
Creatinine, Ser: 1.28 mg/dL — ABNORMAL HIGH (ref 0.76–1.27)
GFR calc Af Amer: 64 mL/min/{1.73_m2} (ref >59)
GFR calc non Af Amer: 55 mL/min/{1.73_m2} — ABNORMAL LOW (ref >59)
Glucose: 103 mg/dL — ABNORMAL HIGH (ref 65–99)
Potassium: 4.9 mmol/L (ref 3.5–5.2)
Sodium: 140 mmol/L (ref 134–144)

## 2021-01-23 LAB — PRO B NATRIURETIC PEPTIDE: NT-Pro BNP: 66 pg/mL (ref 0–376)

## 2021-01-23 NOTE — Telephone Encounter (Signed)
Yes ok to see me but need HRCT ahead. Please tell him will be a quick visit

## 2021-01-23 NOTE — Therapy (Addendum)
[place], [place], [address] Phone: [phone]   Fax:  [phone]  Physical Therapy Evaluation/Discharge  Patient Details  Name: Tanner Bishop MRN: [id number] Date of Birth: February 16, 1946 Referring Provider (PT): [name], MD   Encounter Date: 01/22/2021   PT End of Session - 01/23/21 1649    Visit Number 1    Number of Visits 13    Date for PT Re-Evaluation 03/14/21    Authorization Type [employer] P[employer]    Authorization Time Period Re-asses FOTO on the 6th and 10 visits    Progress Note Due on Visit 10    PT Start Time 1937    PT Stop Time 1140    PT Time Calculation (min) 53 min    Activity Tolerance Patient tolerated treatment well    Behavior During Therapy [facility] for tasks assessed/performed           Past Medical History:  Diagnosis Date  . Allergy   . Arthritis   . Atrial flutter ([place])    a. s/p ablation 03/2015 Dr Lovena Le  . Cataract    removed both eyes  . GERD (gastroesophageal reflux disease)   . H/O hiatal hernia   . Hyperlipidemia   . Injury of right hand    permanent damage after workplace 2009 and 2010 Dr Vernona [facility]  . Pulmonary fibrosis ([place])    sees Dr. Onnie Graham   . Renal stone 06/2014  . Ulcer    unresolved    Past Surgical History:  Procedure Laterality Date  . ATRIAL FLUTTER ABLATION N/A 04/08/2015   Procedure: ATRIAL FLUTTER ABLATION;  Surgeon: Evans Lance, MD;  Location: [facility] CATH LAB;  Service: Cardiovascular;  Laterality: N/A;  . CATARACT EXTRACTION, BILATERAL  2016  . COLONOSCOPY  03/01/2017   per Dr. Loletha Carrow, clear, repeat in 5 yrs (brother had colon cancer)   . ESOPHAGOGASTRODUODENOSCOPY  2007  . EXTRACORPOREAL SHOCK WAVE LITHOTRIPSY  06-17-14   per Dr. Jeffie Pollock   . HAND SURGERY Right   . LUNG BIOPSY Left 03/22/2013   Procedure: LUNG BIOPSY;  Surgeon: Melrose Nakayama, MD;  Location: [place];  Service: Thoracic;  Laterality: Left;   . POLYPECTOMY    . UPPER GASTROINTESTINAL ENDOSCOPY    . VIDEO ASSISTED THORACOSCOPY Left 03/22/2013   Procedure: VIDEO ASSISTED THORACOSCOPY;  Surgeon: Melrose Nakayama, MD;  Location: [place];  Service: Thoracic;  Laterality: Left;    There were no vitals filed for this visit.        [facility] PT Assessment - 01/23/21 0001      Assessment   Medical Diagnosis Chronic left-sided low back pain with left-sided sciatica    Referring Provider (PT) [name], MD    Onset Date/Surgical Date --   5 to 6 years with it wrose this year   Hand Dominance Right    Prior Therapy No      Precautions   Precautions None      Restrictions   Weight Bearing Restrictions No      Balance Screen   Has the patient fallen in the past 6 months No      [place] residence    Living Arrangements Spouse/significant other    Type of [place] to enter    Entrance Stairs-Number of Steps 1    Entrance Stairs-Rails None    Home Layout One level  Prior Function   Level of Independence Independent    Vocation Retired      Charity fundraiser Status Within Functional Limits for tasks assessed      Observation/Other Assessments   Focus on Therapeutic Outcomes (FOTO)  38%   SPARE 62     Sensation   Light Touch Appears Intact      Posture/Postural Control   Posture/Postural Control Postural limitations    Postural Limitations Forward head      Deep Tendon Reflexes   DTR Assessment Site Patella;Achilles    Patella DTR 1+   bilat   Achilles DTR 1+   bilat     ROM / Strength   AROM / PROM / Strength AROM;Strength      AROM   AROM Assessment Site Lumbar    Lumbar Flexion Full, min increase    Lumbar Extension 50% limited, increase    Lumbar - Right Side Bend 25% limited    Lumbar - Left Side Bend 75% limited, marked increse    Lumbar - Right Rotation 25% limited    Lumbar - Left Rotation 50% limited       Strength   Overall Strength Comments LE myotomal screen neg      Palpation   Palpation comment TTP L paraspinals      Special Tests    Special Tests Lumbar    Lumbar Tests other      other   Findings Positive    Side  Left    Comments L LE LAD was positive for reduction in pain      Transfers   Transfers Sit to Stand;Stand to Sit    Sit to Stand 7: Independent      Ambulation/Gait   Ambulation/Gait Yes    Ambulation/Gait Assistance 7: Independent    Gait Pattern Antalgic   LT                     Objective measurements completed on examination: See above findings.               PT Education - 01/23/21 1444    Education Details Eval findings. POC, initial HEP, decrease prolonged walking to 2, 48mn sessions vs. 1 walk for 1.5 hours, positioning and support for sleeping    Person(s) Educated Patient    Methods Explanation;Demonstration;Tactile cues;Verbal cues;Handout    Comprehension Verbalized understanding;Returned demonstration;Verbal cues required;Tactile cues required;Need further instruction            PT Short Term Goals - 01/23/21 2205      PT SHORT TERM GOAL #1   Title Pt will be Ind in an initial HEP    Baseline started on eval    Status New    Target Date 02/13/21      PT SHORT TERM GOAL #2   Title Pt will voice understanding of measures to assist in the reduction of pain.    Status New    Target Date 02/13/21             PT Long Term Goals - 01/23/21 2208      PT LONG TERM GOAL #1   Title Pt will be Ind in a final HEP to maintain or progressed achieved level of function    Status New    Target Date 03/14/21      PT LONG TERM GOAL #2   Title Pt will report improved low back and L LE pain with daily activities  to consistently 4/10 or less    Baseline 5-10/10    Status New    Target Date 03/14/21      PT LONG TERM GOAL #3   Title Pt will return demonstration of proper body mechanics to decrease forces which aggrevate  pain.    Status New    Target Date 03/14/21      PT LONG TERM GOAL #4   Title Pt will demostrate improved low back mobility to 50% limitation for LSB, 25% limitation for ext, and 25% limitation for L rot. for improved back function.    Baseline 705, 50%, and 50% limitation respectively    Status New    Target Date 03/14/21      PT LONG TERM GOAL #5   Title Pt's FOTO score will improve to the predicted value of 54% functional ability    Baseline 38%    Status New    Target Date 03/14/21                  Plan - 01/23/21 1652    Clinical Impression Statement Pt presents c chronic low back pain, L>R, which has wrosened and progressed to L LE. Pt responded favorably to lumbar flexion and LAD distraction of the L LE, whlie compresisve low back movements, ext, L SB, and L rot, increased the pain. Pt will benefit from PT 2w6 for flexion biased ROM exs, lumbopelvic strengthening, back care, modalities, and manual techniques to decrease pain and optimize functional abilities.    Personal Factors and Comorbidities Past/Current Experience;Time since onset of injury/illness/exacerbation;Age;Comorbidity 1    Comorbidities Arhtritis    Examination-Activity Limitations Squat;Sleep;Lift    Stability/Clinical Decision Making Evolving/Moderate complexity    Clinical Decision Making Moderate    Rehab Potential Fair    PT Frequency 2x / week    PT Duration 6 weeks    PT Treatment/Interventions ADLs/Self Care Home Management;Electrical Stimulation;Iontophoresis 71m/ml Dexamethasone;Moist Heat;Traction;Therapeutic exercise;Therapeutic activities;Patient/family education;Manual techniques;Dry needling;Taping;Joint Manipulations    PT Next Visit Plan Review HEP and education. Progress HEP. Use of modalities and maual techniques as indicated.    PT Home Exercise Plan [id number]    Consulted and Agree with Plan of Care Patient           Patient will benefit from skilled therapeutic intervention in  order to improve the following deficits and impairments:  Abnormal gait,Decreased range of motion,Decreased activity tolerance,Pain,Decreased mobility,Decreased strength  Visit Diagnosis: Chronic left-sided low back pain with left-sided sciatica  Difficulty in walking, not elsewhere classified  Decreased ROM of lumbar spine     Problem List Patient Active Problem List   Diagnosis Date Noted  . Paroxysmal [place] fibrillation ([place] 08/29/2020  . [place] pulmonary fibrosis [place] 08/05/2020  . Chronic left-sided low back pain with left-sided sciatica 05/22/2020  . Palpitations 07/19/2019  . Irregular heart rhythm 08/18/2018  . Dyspnea 08/18/2018  . Diarrhea 11/02/2016  . Research study patient 09/24/2015  . Atrial flutter ([place] 04/08/2015  . Hyperglycemia 02/27/2015  . Hyperlipidemia 02/27/2015  . Atrial flutter, unspecified 02/11/2015  . Renal stone 07/12/2014  . Personal history of colonic polyps 08/18/2013  . Family history of malignant neoplasm of gastrointestinal tract 08/18/2013  . Malar rash 07/13/2013  . Dysphagia, unspecified(787.20) 03/06/2013  . Chronic cough 02/16/2013  . GERD 01/29/2010    AGar PontoMS, PT 01/23/21 10:30 PM  [facility] [address]Phone: [phone]  Fax:  [phone] Name: JTico  Ventrella MRN: [id number] Date of Birth: 12-Aug-1946   Discharge: Following Initial Eval Pt cancelled scheduled PT visits due to health problems.  Casara Perrier MS, PT 03/11/21 1:59 PM

## 2021-01-23 NOTE — Telephone Encounter (Signed)
lmtcb for pt.   MR - there is an opening with you tomorrow 2.11.22 for 15 min slot. There are no openings with the APPs today nor tomorrow. Please advise if ok to make appt.

## 2021-01-23 NOTE — Telephone Encounter (Signed)
He had cardiac blood work of BNP, troponin and D-dimer yesterday and this was normal.  His last pulmonary function test was June 2021 His last CT chest high-resolution was in February 2020 His last visit was with me in November 2021  Plan -Any Covid symptoms in the last week or 2? -Do urgent high-resolution CT supine and prone chest this week today or tomorrow -Can you be seen in this office this week either today or tomorrow by myself or nurse practitioner  ->?  Please let me know      LABS    PULMONARY No results for input(s): PHART, PCO2ART, PO2ART, HCO3, TCO2, O2SAT in the last 168 hours.  Invalid input(s): PCO2, PO2  CBC Recent Labs  Lab 01/22/21 1506  HGB 15.2  HCT 45.4  WBC 9.1  PLT 231  Results for VEER, ELAMIN (MRN [id number]) as of 01/23/2021 12:33  Ref. Range 06/11/2020 10:36 08/05/2020 11:40 11/27/2020 10:32 12/11/2020 10:24 01/22/2021 15:07  Creatinine Latest Ref Range: 0.76 - 1.27 mg/dL  1.19 (H)   1.28 (H)    COAGULATION No results for input(s): INR in the last 168 hours.  CARDIAC  No results for input(s): TROPONINI in the last 168 hours. Recent Labs  Lab 01/22/21 1507  PROBNP 66     CHEMISTRY Recent Labs  Lab 01/22/21 1507  NA 140  K 4.9  CL 99  CO2 22  GLUCOSE 103*  BUN 18  CREATININE 1.28*  CALCIUM 9.3   Estimated Creatinine Clearance: 48.1 mL/min (A) (by C-G formula based on SCr of 1.28 mg/dL (H)).   LIVER No results for input(s): AST, ALT, ALKPHOS, BILITOT, PROT, ALBUMIN, INR in the last 168 hours.   INFECTIOUS No results for input(s): LATICACIDVEN, PROCALCITON in the last 168 hours.   ENDOCRINE CBG (last 3)  No results for input(s): GLUCAP in the last 72 hours.       IMAGING x48h  - image(s) personally visualized  -   highlighted in bold DG Chest 2 View  Result Date: 01/23/2021 CLINICAL DATA:  Shortness of breath cardiac palpitations EXAM: CHEST - 2 VIEW COMPARISON:  12/01/2016 FINDINGS: Cardiac shadow is at the  upper limits of normal in size but stable. Lungs are well aerated bilaterally with mild fibrotic scarring stable in appearance from the prior exam. No new focal infiltrate or sizable effusion is seen. No acute bony abnormality is noted. IMPRESSION: Chronic scarring without acute abnormality. Electronically Signed   By: Inez Catalina M.D.   On: 01/23/2021 08:24      Current Outpatient Medications:  .  chlorpheniramine-HYDROcodone (TUSSIONEX PENNKINETIC ER) 10-8 MG/5ML SUER, Take 5 mLs by mouth 2 (two) times daily as needed for cough (Do not take with other sedating medication)., Disp: 140 mL, Rfl: 0 .  diphenhydrAMINE (BENADRYL) 25 MG tablet, Take 25 mg by mouth every 6 (six) hours as needed for allergies or sleep. Patient reports taking at bedtime, Disp: , Rfl:  .  flecainide (TAMBOCOR) 50 MG tablet, Take 1 tablet (50 mg total) by mouth 3 (three) times daily., Disp: 270 tablet, Rfl: 3 .  fluticasone (FLONASE) 50 MCG/ACT nasal spray, SPRAY 2 SPRAYS INTO EACH NOSTRIL EVERY DAY, Disp: 48 mL, Rfl: 5 .  gabapentin (NEURONTIN) 300 MG capsule, Take 1 capsule (300 mg total) by mouth 3 (three) times daily., Disp: 90 capsule, Rfl: 5 .  OFEV 150 MG CAPS, TAKE 1 CAPSULE BY MOUTH TWICE DAILY WITH FOOD., Disp: 60 capsule, Rfl: 10 .  pantoprazole (PROTONIX) 40 MG tablet,  Take 1 tablet (40 mg total) by mouth 2 (two) times daily., Disp: 180 tablet, Rfl: 3 .  predniSONE (DELTASONE) 5 MG tablet, TAKE 1 TABLET BY MOUTH EVERY DAY WITH BREAKFAST, Disp: 30 tablet, Rfl: 3 .  zolpidem (AMBIEN) 5 MG tablet, TAKE 1 TABLET BY MOUTH EVERY DAY AT BEDTIME AS NEEDED FOR SLEEP, Disp: 30 tablet, Rfl: 5  Current Facility-Administered Medications:  .  0.9 %  sodium chloride infusion, 500 mL, Intravenous, Continuous, Danis, Kirke Corin, MD

## 2021-01-23 NOTE — Telephone Encounter (Signed)
Order placed.  Patient is supposed to see MR 01/24/21 at 0945  If HRCT cannot be done before we might have to reschedule patients appointment

## 2021-01-23 NOTE — Telephone Encounter (Signed)
PCC's please advise if a HRCT can be scheduled before pt's potential appt tomorrow 01/23/21 at 9:45am. Appt slot is being held for when pt calls back.   We have not spoken to pt yet about the needing CT, waiting on him to return call.

## 2021-01-23 NOTE — Telephone Encounter (Signed)
Pt is returning call concerning the HRCT, Please advise [phone]

## 2021-01-23 NOTE — Telephone Encounter (Addendum)
I have scheduled pt at [facility] for in the morning at 8:00, arrive 7:45, no prep.  Their address [address].  Lauren asked me to route message back to triage so pt can be made aware of appt in the morning with MR and told about CT.

## 2021-01-23 NOTE — Telephone Encounter (Signed)
We dont have any order to schedule off of

## 2021-01-23 NOTE — Telephone Encounter (Signed)
Spoke with pt and reviewed CT appointment and OV appointment information. Pt stated understanding. Nothing further needed at this time.

## 2021-01-23 NOTE — Telephone Encounter (Signed)
followup with Tanner Bishop as scheduled. GT

## 2021-01-24 ENCOUNTER — Encounter: Payer: Self-pay | Admitting: Internal Medicine

## 2021-01-24 ENCOUNTER — Other Ambulatory Visit: Payer: Self-pay

## 2021-01-24 ENCOUNTER — Ambulatory Visit: Payer: PPO | Admitting: Internal Medicine

## 2021-01-24 ENCOUNTER — Ambulatory Visit (HOSPITAL_BASED_OUTPATIENT_CLINIC_OR_DEPARTMENT_OTHER)
Admission: RE | Admit: 2021-01-24 | Discharge: 2021-01-24 | Disposition: A | Discharge status: Home / Self Care | Payer: PPO | Source: Ambulatory Visit | Attending: Internal Medicine | Admitting: Internal Medicine

## 2021-01-24 ENCOUNTER — Telehealth: Payer: Self-pay | Admitting: Internal Medicine

## 2021-01-24 VITALS — BP 112/78 | HR 88 | Temp 97.2°F | Ht 67.0 in | Wt 171.0 lb

## 2021-01-24 DIAGNOSIS — R0902 Hypoxemia: Secondary | ICD-10-CM | POA: Diagnosis not present

## 2021-01-24 DIAGNOSIS — I288 Other diseases of pulmonary vessels: Secondary | ICD-10-CM

## 2021-01-24 DIAGNOSIS — R053 Chronic cough: Secondary | ICD-10-CM | POA: Diagnosis not present

## 2021-01-24 DIAGNOSIS — J84112 Idiopathic pulmonary fibrosis: Secondary | ICD-10-CM

## 2021-01-24 MED ORDER — PREDNISONE 10 MG (21) PO TBPK: ORAL_TABLET | Freq: Every day | ORAL | 0 refills | Status: DC

## 2021-01-24 NOTE — H&P (View-Only) (Signed)
#IPF - uip path 03/22/13  - On OFEV since early May 2015   PFT FVC fev1 ratio BD fev1 TLC DLCO Walk test 172f x 3 laps wt rx             Spring 2014 2.9:L L/% /%        June 2015 2.7L/67% 2.34L/78% 86  3.68/57% 20.24/71%   ofev start  [name]  2016 2.5L/55% 2.1L/60% 84/108%    No desat, Pk HR 130    02/11/2015 Screening visit afferent cough study 2.59L/56% 2.24L/63% 87/112%    Dx with afluttter on ekg   Screen failed due to hx of stones  03/26/2015        No desat, PK HR 130    08/26/15 pft machine 2.73L/68% 2.38L/80% 87   [name]     06/29/2016 Office spiro 2.49L/56% 2.14L/65%     Pk HR 90 and lowest pulse ox 99% ->93%  ofev  11/02/2016  office full pft  2.61L/66% 2.3L/79%    17.23/60%   pfev  09/16/2017  2.53L/64%      Pulse ox 99% -> 93%, HR 81- > 84  ofev  12/22/2017        100% -> 97%,  HR 66 -> 96    09/19/2018        100% -> 93, HR 74 -> 85         OV 03/26/2015  Chief Complaint  Patient presents with  . Follow-up    [name], dry cough. CATH procedure on 04/08/15. Denies any chest tightnes/congestion.    75year old male with idiopathic pulmonary fibrosis. He is on Ofev after having failed Esbriet in the past due to side effects. He is tolerating Ofev well. Liver function test in March 2016 was normal. I last saw him in January 2016. Subsequently in February 2016 we screened him for a cough idiopathic pulmonary fibrosis study called afferent but he screen failed due to history of renal stones. At this time he was incidentally diagnosed with new onset atrial flutter. He has seen Dr. GCrissie Sicklesand has been started on anticoagulation with Xarelto and metoprolol. He is due to undergo a ablation. He is not aware of the increased bleeding risk with Ofev in the setting of anticoagulation. He assures me that the anti-coag and is only until he undergoes ablation and after that the plan is to stop it. Therefore only short-term anticoagulation with Xarelto.  Overall his effort tolerance is fine. He has postponed his [facility] lung transplant until he completes his ablation.  Walking desaturation test in the office today he did not desaturate. This is stable. Spirometry not done   Past medical history: Atrial flutter new diagnoses as above   OV 06/29/2016  Chief Complaint  Patient presents with  . Follow-up    pt states he is at baseline: sob with exertion, nonprod cough.      75year old male with idiopathic pulmonary fibrosis. He is on Ofev . He completed 6 months of PRAISE study (([id number] placebo) in l;ate winter/early spring 2017. This is [place]. Overall stable. No worsening dyspnea and cough. Continues daily exercise is walking many miles. Some days he is sitting more than the others. He is interested in more trials and results of the praise study. These results are not out yet. He is compliant with his Ofe last liver function test was in February 2017. There are no new issues. He believes his atrial fibrillation is  in sinus rhythm;    OV  11/02/2016  Chief Complaint  Patient presents with  . Follow-up    4 month IPF follow up and B&A review - does report increased prod cough with yellow mucus, wheezing, tightness in chest, increased SOB, x3-4 weeks with the weather change.  denies any f/c/s, hemoptysis, chest pain   IPF - on ofev. Last liver function test was in July 2017 and normal. Overall dyspnea stable. However he is been having diarrhea with ofev. He called in and we reduced it to 1 tablet a day and this improved the diarrhea. He back on 1 tablet twice a day. The diarrhea has returned although it is much milder. He has never taken any medication for this. Are function test shows slight improvement from July 2017 and similar levels to approximately one year ago  The new issue that he's having significant recurrent sinus infection in the back of chronic postnasal drainage. This since the fall 2017. He says that he and his wife catch  respirator infection and passive back and forth to each other. He is having cough, chest tightness, postnasal drip and yellow sputum. It is not affecting any dyspnea. There is no wheeze. There is no fever or hemoptysis.  OV 03/02/2017  Chief Complaint  Patient presents with  . Follow-up    Pt states his breathing is at baseline. Pt c/o dry cough - pt states this is baseline. Pt denies CP/tightness.     Idiopathic pulmonary fibrosis on Ofev. Last seen November 2017. He is a routine follow-up.  He is here with his wife. His sinus infections a result. He is interested in research protocols but we have none right now. He is tolerating his Ofev associated with some mild diarrhea occasional which she takes medication. He is not much of a problem. He tells me that she walks 6 times a week for 5 miles a day. On the treadmill at a 5 incline walking at 3-1/2 miles an hour. For this he feels a little more dyspneic than before. Walking desaturation test 185 feet 3 laps on room air: His pulse ox dropped from 98% at rest and 93% with exertion. His heart rate jumped from 66 at rest and 97 at peak exertion. Features are suggestive of mild progression of the disease. He is not attending pulmonary fibrosis foundation because he felt this was negative but that was years ago. We discussed this and he might be interested again.     OV 09/16/2017  Chief Complaint  Patient presents with  . Follow-up    PFT done today. Pt states that his breathing is gradually getting worse. SOB which depends if it is on exertion vs all the time and c/o prod cough with yellow mucus. Denies any CP   S IPF on fev followup. Overall doing well. He recent bronchitis and liked anabiotic and prednisone. He felt the prednisone helped his cough just currently rated as mild to moderate in severity. Helped the shortness of breath. He also helped arthralgia. He is now wondering what taking chronic prednisone. We had an extensive discussion  about this. Spirometry shows that the FVC stable compared to earlier this year but slightly progressive compared to one year ago. [facility] interstitial lung disease questionnaire shows symptoms. His wife is here with him. He is interested in research trials. He has participated in distress before. He is asking about opioid refill for cough if I wont do prednisone.   OV 12/22/2017  Chief Complaint  Patient  presents with  . Follow-up    Pt states he believes his breathing is at a stable point right now. States that he is coughing which is worse when he first wakes up and then also at night; occ will cough up yellow phlegm.     C.o ofev intolerance with fatigue, weight loss, diarrhea. Does not like lomotil. Wnaant to give it a holiday foir 2-3 weeks. INterested in research protocol - discussed [place]. 5 year cut off since diagnosis coming up 03/22/18. He is overall frustrated ith qualtiy of life. Walks 5 miles; starts feeling dizzy > 3 miles in but not checking pulse ox despite advice. Walking desaturation test on 12/22/2017 185 feet x 3 laps on ROOM AIR:  did noit desaturate. Rest pulse ox was 100%, final pulse ox was 97%. HR response 66/min at rest to 96/min at peak exertion. Patient Tanner Bishop  Did not Desaturate < 88% . Tanner Bishop yes  Desaturated </= 3% points. Tanner Bishop yes did get tachyardic     Walking desaturation test on 02/01/2018 185 feet x 3 laps on ROOM AIR:  did not desaturate. Rest pulse ox was 100%, final pulse ox was 91%. HR response 67/min at rest to 85/min at peak exertion. Patient Tanner Bishop  dod not Desaturate < 88% . Tanner Bishop yes diod  Desaturated </= 3% points. Tanner Bishop did not get tachyardic   OV 02/01/2018  Chief Complaint  Patient presents with  . Follow-up    Pt states he has been doing good since last visit. Started back on OFEV 12/24/17 after taking a holiday off of it.     FU IPF  Took ofev holiday. And then restarted 01/24/18 and doing well.  [place] ofev ok. KBILD ok.   OV 03/16/2018'  . Chief Complaint  Patient presents with  . Follow-up    PFT done today. States he has been doing well and denies any current complaints. Tolerating OFEV well and has been walking about 5 miles every day with no complaints.   Ms. Pineo returns for follow-up of idiopathic pulmonary fibrosis.  He is now back on nintedanib after giving it a holiday for a few weeks.  He is tolerating it well and no problems.  He goes for daily walks and has no problems.  His lung function was reviewed today and it shows for the first time a significant decline of greater than 200 cc between 2 time points.  This correlates with him not taking over for a few weeks nevertheless overall he is happy with his health.  He is not interested in research trials anymore.  He has a new question about travel advice encounter.  He plans an [place] from [place] intrajejunal in [place] for 10 days.  This will start on May 22, 2018.  He wants to make sure it is safe for him to go.  He said he has never been on a cruise and he and his wife have a strong desire to go as part of his goals of care.   OV 05/16/2018  Chief Complaint  Patient presents with  . Follow-up    Breathing is unchanged since last OV.    Tanner Bishop , 75 y.o. , with dob 01/10/1946 and male ,Not Hispanic or Latino from [address] [place] [address] - presents to pulm ILD clinic for IPF.  He presents with his wife.  And just under 7 days he is going to embark on a [place]  cruise.  This visit is precautionary visit just before that.  Most recent visit was in April 2019.  At this point in time in terms of his IPF he feels stable.  He is compliant with full dose of 5 and is having only mild diarrhea.  Other than that he is tolerating it just fine.  He is looking forward to his cruise.  He had pulmonary function test today that shows an improvement compared to last visit and it  is very similar to summer/fall 2018 in terms of FVC and DLCO.  Although he does feel like he is coming down with an acute bronchitis and wants to take some antibiotic and prednisone I had of the trip.  We also discussed about him having prophylactic antibiotics handy.  He wants a refill on his Tussionex for cough.  At this point in time he is past 5 years of diagnosis and not eligible for research trials he is not interested in transplant although the recent pulmonary fibrosis foundation meeting he did hear from a lot of transplant patients about the individual personal experience.       OV 09/19/2018  Subjective:  Patient ID: Tanner Bishop, male , DOB: [birth year] , age 14 y.o. , MRN: [id number] , ADDRESS: [place] [id number]   09/19/2018 -   Chief Complaint  Patient presents with  . Follow-up    Pt saw cards 08/23/18. States he is still having problems with his heart going in and out of rhythm and states he has had some episodes to where he almost passes out. States his breathing is at a stable point. States he also has  an occ cough.     HPI Tanner Bishop 75 y.o. -presents 5-year follow-up.  I personally saw him in June 2019.  Then approximately a month ago he presented and saw acutely my nurse practitioner because of dizziness.  It was felt to be A. fib RVR.  He subsequently saw Dr. Crissie Sickles his electrophysiologist.  I reviewed the note.  Flecainide was started.  He says despite that l approximately 10 weeks ago he had another episode of dizziness while getting out of the shower.  He was tachycardic with a heart rate of 140s.  He felt he might pass out but there is no focal neurologic deficits or abnormal sensation.  His pulse ox was normal at 97% at that time.  It happened at rest.  He says he has been walking on the treadmill for several miles and he does not desaturate.  He says his pulse ox is 93% at that time.  He never drops into the 80s.  He does not think his dizziness  and palpitation issues are related to his pulmonary fibrosis.  However [name] is wondering about oxygen drops during this time.  He is not tolerating his nintedanib at full dose without any problems.  He has had a successful [place] trip.  He is participating in the patient support group.  He is up-to-date with his flu shot      OV 10/13/2018  Subjective:  Patient ID: Tanner Bishop, male , DOB: 06/21/1946 , age 46 y.o. , MRN: [id number] , ADDRESS: [place] [id number]   10/13/2018 -   Chief Complaint  Patient presents with  . Follow-up    review PFT.  c/o baseline sob with exertion.       HPI Tanner Bishop 75 y.o. -presents for follow-up of IPF to the ILD clinic.  This visit is arranged because letter physiologist [name] was concerned about hypoxemia effects on his atrial fibrillation.  At this point in time patient tells me that after increasing dose of flecainide his atrial fibrillation and palpitations have improved and resolved.  Overall he feels stable.  On September 26, 2018 he had 6-minute walk test and this was pretty robust with walking 384 meters and with lowest pulse ox of 95%.  Then on October 05, 2018 he had overnight pulse oximetry that shows 12 minutes of desaturation less than 88%.  He is not interested in oxygen.  Then he called his October 07, 2018 with worsening bronchitis episodes and we gave him 5 days of prednisone and antibiotics.  He finished his last dose yesterday.  Overall he feels stable but pulmonary function test today shows decline in both FVC and DLCO.  And then when I questioned him he felt like he still had some residual bronchiti and feels another course of prednisone could help him.  There are no other new issues.  Last liver function test October 2019 and was normal.      OV 01/12/2019  Subjective:  Patient ID: Tanner Bishop, male , DOB: 1946-02-26 , age 57 y.o. , MRN: [id number] , ADDRESS: [place]  [id number]   01/12/2019 -   Chief Complaint  Patient presents with  . Follow-up    PFT performed today. Pt states he is about the same since last visit.States he still becomes SOB with exertion, has a lot of coughing but has been taking mucinex, and also has had some CP or chest tightness.     HPI Tanner Bishop 75 y.o. -returns for follow-up of his IPF.  He is now more than 5 years since diagnosis.,  April 2020 he will be 6 years since diagnosis.  He continues to exercise well on the treadmill and according to his wife he is does well.  He is not dropping oxygen at this time.  He does not have a cough when needed works on the tile.  However at other times the day especially when he is trying to lie down to bed or when he gets up in the morning and goes to the bathroom he has really bad cough.  Overall the cough severity is now 6 out of 10 in terms of how it is impacting his quality of life.  He tells me that every time he takes prednisone for the cough he feels better but then when he comes off prednisone cough gets worse.  He said multiple prednisone courses of brief duration for this chronic cough.  In addition for the last few weeks has had yellow sinus drainage and sinus headache and sinus congestion and he feels this is again making his chronic cough worse.  He is open to taking antibiotic and prednisone course.  In terms of taking nintedanib he has no side effects.  His last liver function test reviewed was in October 2019 and it was normal at that time.  Overall at this point in time he really wants good significant support for his cough in terms of affecting his quality of life.    ROS - per HPI     OV 02/16/2019  Subjective:  Patient ID: Tanner Bishop, male , DOB: 1946-11-25 , age 37 y.o. , MRN: [id number] , ADDRESS: [address] [place] [address]   02/16/2019 -   Chief Complaint  Patient presents with  . Follow-up    HRCT  and sinus CT performed 2/18. Pt states he is about the same  as last visit and states SOB is about the same. Pt still has a dry cough. Denies any complaints of CP/chest tightness.     HPI Tanner Bishop 75 y.o. -returns for follow-up of his IPF.  He presents with his wife.  He is here to review the test results.  He had high-resolution CT chest and CT sinus.  These were done in order to reevaluate his disease because he is having significant cough.  His high-resolution CT chest shows only mild progression in his IPF in 3 years.  However there is significant new findings of tracheobronchomalacia although there is no lung cancer.  He also has two-vessel coronary artery calcification.  These are new findings.  Since last visit and a sinus episode he is stable.  He is currently run out of his nasal steroids.  He says the pharmacy said that I declined his prescription request.  I do not recollect such a conversation.  In any event he is now going to participate in the cough study called scenic.  He has received a copy of the consent form.  He is rated.  He has an appointment for his consent visit.  It is possible that the nasal steroid is on the exclusion list but I do not have the exclusion list with me.  Anyway he is not consented.  He is not having chest pain when he does his treadmill exercises.  He works out hard.  He states he gets tachycardic.  He says he has a 6 times that he will die from his heart condition before his pulmonary fibrosis.  He has upcoming appointment with his cardiologist [name] for atrial fibrillation.  ...................................................................... OV 06/08/2019   Subjective:  Patient ID: Tanner Bishop, male , DOB: 23-Sep-1946 , age 26 y.o. , MRN: [id number] , ADDRESS: [place] [id number]   06/08/2019 -   Chief Complaint  Patient presents with  . Follow-up    1wk f/u for IPF. Patient stated that he was given a round of prednisone last week for some SOB but is feeling much better.       HPI Tanner Bishop 75 y.o. -returns for IPF follow-up.  Last seen in March 2020.  Since the pandemic started he has been social distancing.  He has not attended any support group.  He has been tolerating his Ofev quite well without any difficulty.  Recently had a bronchitis exacerbation and we gave prednisone.  This resolved his cough.  He was going to participate in a cough study but because of the pandemic the study got canceled.  He tells me that every time he takes prednisone the cough goes away.  He is very interested in taking daily prednisone for symptom relief of cough.  We discussed the side effects of prednisone that include weight gain, easy bruising, adrenal insufficiency, osteoporosis, cataracts hypertension, diabetes, immunosuppression.  Despite all this he wants to take a low-dose prednisone daily.  His last liver function test was in March 2020.  This needs to be retested because he is on nintedanib.  His symptom scores at walk test show relative stability.  His last pulmonary function test was in January 2020 but because of the pandemic is on hold.  We are using symptom and walk test to determine progression.         OV 04/11/2020  Subjective:  Patient ID: Tanner Bishop, male , DOB: 03/07/1946 ,  age 58 y.o. , MRN: [id number] , ADDRESS: [address] [place] [address]   04/11/2020 -   Chief Complaint  Patient presents with  . Follow-up    PFT performed today. Pt states he feels like his breathing is gradually becoming worse and states it could be at any time that he is SOB.   #IPF - uip path 03/22/13  - On OFEV since early May 2015 -On chronic daily prednisone because of cough since 2020  HPI Tanner Bishop 75 y.o. -presents with his wife IPF follow-up.  Last saw him June 2020.  After that he feels his dyspnea is worse.  He says that he could do several miles in one 1 hour and 30 minutes on the treadmill.  The same distance and now taking 1 hour and 40 minutes.  He  feels his disease is getting worse.  He says the prednisone is working well for him because of improved cough and wellbeing.  He is having some skin bruising because of that.  The nintedanib he is tolerating well except for some mild diarrhea.  On objective symptom score is actually similar to 1 year ago but may be slightly worse than 6 months ago or 9 months ago.  On pulmonary function testing he is stable compared to 1 year ago but slow and steadily he is definitely worse over the many years.  We noticed on his walking desaturation test that he did not mount a tachycardic response as he is done in the past.  He is on flecainide for atrial fibrillation.  He takes his 3 times daily.  He feels his atrial fibrillation is under good control.  There is no chest pain.  There is no weight loss.  Is no loss of physical conditioning.  He is interested in clinical trials.   A month ago he had nausea and his amylase/lipase was elevated.  He says he is fine now.  Primary care address this. ROS - per HPI  IMPRESSION: 1. 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 Findings are consistent with UIP per consensus guidelines: Diagnosis of Idiopathic Pulmonary Fibrosis: An Official ATS/ERS/JRS/ALAT Clinical Practice Guideline. [place], Iss 5, [phone], Aug 14 2017. 2. 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 3. Two-vessel coronary atherosclerosis.  Aortic Atherosclerosis (ICD10-I70.0).   Electronically Signed   By: Ilona Sorrel M.D.   On: 01/31/2019 12:24  OV 06/11/2020  Subjective:  Patient ID: Tanner Bishop, male , DOB: Mar 25, 1946 , age 38 y.o. , MRN: [id number] , ADDRESS: [place] [id number]   06/11/2020 -   Chief Complaint  Patient presents with  . Follow-up    PFT performed today.  Pt states he has been doing good since  last visit. States breathing is about the same.,    #IPF - uip path 03/22/13  - On OFEV since early May 2015 -On chronic daily prednisone because of cough since 2020  HPI Tanner Bishop 75 y.o. -returns to clinic for ILD follow-up.  In the interim he feels that his wife has worsening dementia.  She is able to self-care but only does minimal cooking.  She is more irritable.  His daughter Theadora Rama is now on disability because of back issues.  Patient has a son in [place] who he is close with.  Together they take care of his wife.  He says he has become  the primary caregiver now for his wife.  Nevertheless she is functional and does some ADLs.  He continues with nintedanib 150 mg twice daily.  Overall tolerance is good just mild diarrhea.  This is baseline.  Shortness of breath is baseline symptom score is 14 and similar to the past.  Pulmonary function test appears stable.  Walking desaturation test is stable.  His last liver function test was 2 months ago.  His main issue is that he is having some financial issues covering the cost of nintedanib.  He $6000 grant is running out.  He only has 1 month supply left.  He wants to meet with the pharmacist.      OV 11/12/2020   Subjective:  Patient ID: Tanner Bishop, male , DOB: Mar 16, 1946, age 65 y.o. years. , MRN: [id number],  ADDRESS: [place] [address] PCP  [name], MD Providers : Treatment Team:  Attending Provider: Brand Males, MD Patient Care Team: [name], MD as PCP - Irineo Axon, MD as Consulting Physician (Pulmonary Disease) [place], Anne Ng, [facility] as Pharmacist (Pharmacist)    Chief Complaint  Patient presents with  . Follow-up    IPF, still getting SOB on DOE with some chest tightness       HPI Tanner Bishop 75 y.o. -IPF follow-up.  Presents with his wife who I am seeing after a long time.  According to the patient patient himself is stable from IPF standpoint.  However he  says for the last several months he has had an exacerbation in his chronic back pain.  He has significant pain.  He has had steroid injections.  The first 1 was successful but the second 1 and subsequent failed.  He is not able to do his walks as he used to in the past.  He has gained some weight.  He is frustrated by this.  He is asking if he can go see a Restaurant manager, fast food.  The other issue with him was elevated amylase.  We never found a cause for this.  So his primary care physician worked him up he had abdominal CT imaging on 08/16/2020 and this was normal.  Otherwise he is okay  He is asking for a refill on his prednisone that he takes for cough.  He also wants Tussionex as needed.  He takes that for cough.  He also says that he got summoned for jury duty.  He is wondering what to do.  I told him under no circumstances he should ever do jury duty.  I worry about the risk   ROS - per HPI  OV 01/24/2021  Subjective:  Patient ID: Tanner Bishop, male , DOB: 1946/05/18 , age 57 y.o. , MRN: [id number] , ADDRESS: [address] [place] [address] PCP [name], MD Patient Care Team: [name], MD as PCP - General Brand Males, MD as Consulting Physician (Pulmonary Disease) [place], Anne Ng, [facility] as Pharmacist (Pharmacist)  This Provider for this visit: Treatment Team:  Attending Provider: Brand Males, MD    01/24/2021 -   Chief Complaint  Patient presents with  . Acute Visit    Increased SOB since last Friday, heart still feels "funny"   Follow-up idiopathic pulmonary fibrosis On nintedanib and low-dose prednisone for cough Idiopathic elevation in amylase unrelated to nintedanib.  Normal abdominal imaging.  HPI Tanner Bishop 75 y.o. -returns for an acute visit.  On Friday, 01/22/2021 he says he got up and he felt like baseline and  then when he walked he suddenly noticed that he was suddenly way more short of breath than usual.  His heart started feeling funny.  He took  an acute visit with cardiology.  I reviewed the notes.  I do not see an EKG but he tells me an EKG was done and he was in sinus rhythm.  He had normal troponin, normal BNP and normal D-dimer.  Chest x-ray was baseline.  I personally visualized this.  Therefore he was sent to pulmonary.  We are seeing him as an acute visit today.  His symptom scores are definitely worse than baseline.  He is tolerating his nintedanib and his weight is stable.  His walking desaturation test shows a sudden decline.  For the first time his pulse ox is below 88%.  His pace of walking was much slower.  He tells me he still feels funny in his chest.  There are no Covid symptoms.  He is fully vaccinated.  There is no edema orthopnea proximal nocturnal dyspnea coughing or wheezing.  There is no sputum or fever production.  His wife is here with him..      SYMPTOM SCALE - ILD 02/16/2019  06/08/2019  04/11/2020  06/11/2020  11/12/2020 169# weight 01/24/2021 171#  O2 use _0  ra  Shortness of Breath 0 -> 5 scale with 5 being worst (score 6 If unable to do)       At rest 1 1 0 _1 Simple tasks - showers, clothes change, eating, shaving _2 Household (dishes, doing bed, laundry) _3 Shopping _4 Walking level at own pace _5 Walking up Stairs _6 Total (40 - 48) Dyspnea Score _7 How bad is your cough? 3 Xx - due to recent pred _8 How bad is your fatigue 3 x _9 nausea   0 0 0 0  vomit   0 0 0 0  diarrhea   _10 anxiety   0 0 0 0  deopresso   0 0 0 0       Simple office walk 185 feet x  3 laps goal with forehead probe 09/19/2018  01/12/2019  02/16/2019  06/08/2019  04/11/2020  06/11/2020  11/12/2020  01/24/2021   O2 used Room air Room air Room air Room air  ra ra ra  Number laps completed _11 Comments about pace normal normal normal normal  Mod pace avg [ace Slow pace  Resting Pulse Ox/HR 100% and 73/min 100% ad  74/min 100% and 68/min 98% and 82/min 99% and 60/min 98% and 54 98% and 73/min 94% and 88/min  Final Pulse Ox/HR 93% and 85/min 92% and 84/min 93% and 81/min 91% and 96/mni 91% and 74/mm 90% and 71/min 90% and 86/min 87% and 95/min  Desaturated </= 88% no no no no yes yes yes yes  Desaturated <= 3% points Yes, 7 poin Yes, 8 ponts Yes, 7 points Yes, 7 points Yes, 8point Yes, 8 point Yes, 8 points Yes, 7 points  Got Tachycardic >/= 90/min no no no yes      Symptoms at end of test x none none none dyspnea none None dyspnea dyspnea  Miscellaneous comments  x x stable stable but tachy for first time         Results for DEPAUL, ARIZPE (MRN [id number]) as of 04/11/2020 12:14  Ref. Range 05/14/2014 15:46 07/24/2015 12:43 08/26/2015 10:52 11/02/2016 09:51 05/19/2017 08:41 09/16/2017 08:47 03/16/2018 08:48 05/16/2018 10:53 10/13/2018 09:52 01/12/2019 09:49 04/11/2020 10:56 06/11/2020   FVC-Pre Latest Units: L 2.71 2.69 2.73 2.61 2.51 2.53 2.29 2.38 2.25 2.21 2.23 2.26  FVC-%Pred-Pre Latest Units: % _0 56 57%  Results for JAYCEN, VERCHER (MRN [id number]) as of 04/11/2020 12:14  Ref. Range 05/14/2014 15:46 07/24/2015 12:43 08/26/2015 10:52 11/02/2016 09:51 05/19/2017 08:41 09/16/2017 08:47 03/16/2018 08:48 05/16/2018 10:53 10/13/2018 09:52 01/12/2019 09:49 04/11/2020 10:56 06/11/2020   DLCO unc Latest Units: ml/min/mmHg 20.24 15.36 15.36 17.23 15.83  13.10 16.48 14.17 14.38 14.72 13.69  DLCO unc % pred Latest Units: % 71 54 54 60 55  46 58 50 50 62 58%        CT Chest data  DG Chest 2 View  Result Date: 01/23/2021 CLINICAL DATA:  Shortness of breath cardiac palpitations EXAM: CHEST - 2 VIEW COMPARISON:  12/01/2016 FINDINGS: Cardiac shadow is at the upper limits of normal in size but stable. Lungs are well aerated bilaterally with mild fibrotic scarring stable in appearance from the prior exam. No new focal infiltrate or sizable effusion is seen. No acute bony abnormality is noted. IMPRESSION: Chronic  scarring without acute abnormality. Electronically Signed   By: Inez Catalina M.D.   On: 01/23/2021 08:24   CT Chest High Resolution  Result Date: 01/24/2021 CLINICAL DATA:  IPF EXAM: CT CHEST WITHOUT CONTRAST TECHNIQUE: Multidetector CT imaging of the chest was performed following the standard protocol without intravenous contrast. High resolution imaging of the lungs, as well as inspiratory and expiratory imaging, was performed. COMPARISON:  01/31/2019, 03/11/2016, 07/24/2015, 02/14/2013 FINDINGS: Cardiovascular: Aortic atherosclerosis. Cardiomegaly. Scattered left coronary artery calcifications. Enlargement of the main pulmonary artery measuring up to 3.4 cm in caliber. No pericardial effusion. Mediastinum/Nodes: No enlarged mediastinal, hilar, or axillary lymph nodes. Thyroid gland, trachea, and esophagus demonstrate no significant findings. Lungs/Pleura: Redemonstrated moderate pulmonary fibrosis in a pattern with apical to basal gradient, featuring traction bronchiectasis, irregular peripheral interstitial opacity, septal thickening, subpleural bronchiolectasis, and areas of honeycombing at the lung bases. Fibrotic findings are minimally worsened compared to prior examination dated 01/31/2019 and further slightly worsened over a longer period of follow-up dating back to 02/14/2013. No significant air trapping on expiratory phase imaging. Evidence of prior left lung wedge biopsy. No pleural effusion or pneumothorax. Upper Abdomen: No acute abnormality. Musculoskeletal: No chest wall mass or suspicious bone lesions identified. IMPRESSION: 1. Redemonstrated moderate pulmonary fibrosis in a pattern with apical to basal gradient, featuring traction bronchiectasis, irregular peripheral interstitial opacity, septal thickening, subpleural bronchiolectasis, and areas of honeycombing at the lung bases. Fibrotic findings are minimally worsened compared to prior examination dated 01/31/2019 and further slightly  worsened over a longer period of follow-up dating back to 02/14/2013. Findings are in a UIP pattern and in keeping with reported diagnosis of IPF. 2. Cardiomegaly and coronary artery disease. 3. Enlargement of the main pulmonary artery, as can be seen in pulmonary hypertension. Aortic Atherosclerosis (ICD10-I70.0). Electronically Signed   By: Eddie Candle M.D.   On: 01/24/2021 09:16      PFT  PFT Results Latest Ref Rng & Units 06/11/2020 04/11/2020 01/12/2019 10/13/2018 05/16/2018 03/16/2018 09/16/2017  FVC-Pre L 2.26 2.23 2.21 2.25 2.38 2.29 2.53  FVC-Predicted  Pre % 57 56 56 57 60 58 64  FVC-Post L - - 2.25 - - - -  FVC-Predicted Post % - - 57 - - - -  Pre FEV1/FVC % % 88 89 87 86 88 88 88  Post FEV1/FCV % % - - 90 - - - -  FEV1-Pre L 2.00 1.99 1.93 1.93 2.09 2.02 2.23  FEV1-Predicted Pre % 70 69 67 67 73 70 77  FEV1-Post L - - 2.03 - - - -  DLCO uncorrected ml/min/mmHg 13.69 14.72 14.38 14.17 16.48 13.10 -  DLCO UNC% % 58 62 50 50 58 46 -  DLCO corrected ml/min/mmHg 13.69 14.76 - - - 13.14 -  DLCO COR %Predicted % 58 62 - - - 46 -  DLVA Predicted % 96 110 102 94 110 94 -  TLC L - - - - - - -  TLC % Predicted % - - - - - - -  RV % Predicted % - - - - - - -       has a past medical history of Allergy, Arthritis, Atrial flutter (HCC), Cataract, GERD (gastroesophageal reflux disease), H/O hiatal hernia, Hyperlipidemia, Injury of right hand, Pulmonary fibrosis ([place]), Renal stone (06/2014), and Ulcer.   reports that he quit smoking about 42 years ago. His smoking use included cigarettes. He has a 16.00 pack-year smoking history. He has never used smokeless tobacco.  Past Surgical History:  Procedure Laterality Date  . ATRIAL FLUTTER ABLATION N/A 04/08/2015   Procedure: ATRIAL FLUTTER ABLATION;  Surgeon: Evans Lance, MD;  Location: [facility] CATH LAB;  Service: Cardiovascular;  Laterality: N/A;  . CATARACT EXTRACTION, BILATERAL  2016  . COLONOSCOPY  03/01/2017   per Dr. Loletha Carrow, clear, repeat in 5  yrs (brother had colon cancer)   . ESOPHAGOGASTRODUODENOSCOPY  2007  . EXTRACORPOREAL SHOCK WAVE LITHOTRIPSY  06-17-14   per Dr. Jeffie Pollock   . HAND SURGERY Right   . LUNG BIOPSY Left 03/22/2013   Procedure: LUNG BIOPSY;  Surgeon: Melrose Nakayama, MD;  Location: [facility];  Service: Thoracic;  Laterality: Left;  . POLYPECTOMY    . UPPER GASTROINTESTINAL ENDOSCOPY    . VIDEO ASSISTED THORACOSCOPY Left 03/22/2013   Procedure: VIDEO ASSISTED THORACOSCOPY;  Surgeon: Melrose Nakayama, MD;  Location: [facility];  Service: Thoracic;  Laterality: Left;    No Known Allergies  Immunization History  Administered Date(s) Administered  . Fluad Quad(high Dose 65+) 08/25/2019  . Influenza Split 09/13/2013  . Influenza, High Dose Seasonal PF 10/06/2016, 09/16/2017, 08/17/2018  . Influenza-Unspecified 09/07/2014, 09/14/2015, 09/13/2020  . PFIZER(Purple Top)SARS-COV-2 Vaccination 02/12/2020, 03/12/2020, 08/14/2020  . Pneumococcal Conjugate-13 02/16/2017  . Pneumococcal Polysaccharide-23 05/26/2013  . Td 12/15/2007  . Tdap 02/23/2018  . Zoster 02/19/2014  . Zoster Recombinat (Shingrix) 10/13/2018, 07/25/2019    Family History  Problem Relation Age of Onset  . Liver cancer Mother   . Diabetes Sister   . Lung cancer Sister   . Heart disease Brother   . Diabetes Brother   . Colon cancer Brother   . Diabetes Other   . Cancer Other        prostate  . Colon polyps Neg Hx   . Esophageal cancer Neg Hx   . Rectal cancer Neg Hx   . Stomach cancer Neg Hx      Current Outpatient Medications:  .  chlorpheniramine-HYDROcodone (TUSSIONEX PENNKINETIC ER) 10-8 MG/5ML SUER, Take 5 mLs by mouth 2 (two) times daily as needed for cough (Do  not take with other sedating medication)., Disp: 140 mL, Rfl: 0 .  diphenhydrAMINE (BENADRYL) 25 MG tablet, Take 25 mg by mouth every 6 (six) hours as needed for allergies or sleep. Patient reports taking at bedtime, Disp: , Rfl:  .  flecainide (TAMBOCOR) 50 MG tablet, Take 1  tablet (50 mg total) by mouth 3 (three) times daily., Disp: 270 tablet, Rfl: 3 .  fluticasone (FLONASE) 50 MCG/ACT nasal spray, SPRAY 2 SPRAYS INTO EACH NOSTRIL EVERY DAY, Disp: 48 mL, Rfl: 5 .  gabapentin (NEURONTIN) 300 MG capsule, Take 1 capsule (300 mg total) by mouth 3 (three) times daily., Disp: 90 capsule, Rfl: 5 .  OFEV 150 MG CAPS, TAKE 1 CAPSULE BY MOUTH TWICE DAILY WITH FOOD., Disp: 60 capsule, Rfl: 10 .  pantoprazole (PROTONIX) 40 MG tablet, Take 1 tablet (40 mg total) by mouth 2 (two) times daily., Disp: 180 tablet, Rfl: 3 .  predniSONE (DELTASONE) 5 MG tablet, TAKE 1 TABLET BY MOUTH EVERY DAY WITH BREAKFAST, Disp: 30 tablet, Rfl: 3 .  zolpidem (AMBIEN) 5 MG tablet, TAKE 1 TABLET BY MOUTH EVERY DAY AT BEDTIME AS NEEDED FOR SLEEP, Disp: 30 tablet, Rfl: 5  Current Facility-Administered Medications:  .  0.9 %  sodium chloride infusion, 500 mL, Intravenous, Continuous, Danis, Estill Cotta III, MD      Objective:   Vitals:   01/24/21 1007  BP: 112/78  Pulse: 88  Temp: (!) 97.2 F (36.2 C)  TempSrc: Oral  SpO2: 94%  Weight: 171 lb (77.6 kg)  Height: _0  (1.702 m)    Estimated body mass index is 26.78 kg/m as calculated from the following:   Height as of this encounter: _1  (1.702 m).   Weight as of this encounter: 171 lb (77.6 kg).  _2 @  Filed Weights   01/24/21 1007  Weight: 171 lb (77.6 kg)     Physical Exam General: No distress. Looks anxious Neuro: Alert and Oriented x 3. GCS 15. Speech normal Psych: Pleasant Resp:  Barrel Chest - no.  Wheeze - no, Crackles - yes, No overt respiratory distress CVS: Normal heart sounds. Murmurs - no Ext: Stigmata of Connective Tissue Disease - no HEENT: Normal upper airway. PEERL +. No post nasal drip        Assessment:       ICD-10-CM   1. IPF (idiopathic pulmonary fibrosis) ([place])  J84.112 Ambulatory Referral for DME    Pulse oximetry, overnight    Pulmonary function test  2. Exercise hypoxemia  R09.02  Ambulatory Referral for DME    Pulmonary function test       Plan:     Patient Instructions     ICD-10-CM   1. IPF (idiopathic pulmonary fibrosis) ([place])  J84.112   2. Exercise hypoxemia  R09.02    IPF is definitely progressive.  Now desaturating with exertion There might be associated pulmonary hypertension as well There is no clear-cut evidence of IPF flareup but would not be a bad idea to treat you for 1  Plan - Please take Take prednisone 21m once daily x 3 days, then 397monce daily x 3 days, then 2012mnce daily x 3 days, then prednisone 43m23mce daily  x 3 days and then baseline dosing to continue   -I have written to cardiology to make sure you get echocardiogram  -Have written to cardiology to make sure you get a right heart catheterization -and if you do have pulmonary hypertension: Then you qualify for inhaled treprostinil  which could help your shortness of breath and walking distance  -Start portable oxygen with exertion -Test overnight oxygen desaturation study in room air -if you drop then you qualify for night oxygen  -Using oxygen can help alleviate his symptoms  -Continue nintedanib  -I think you will have to consider clinical trials as a care option in the next few months  -We will see what study might be of potential inclusion/exclusion for you  Follow-up -6-8 weeks to spirometry and DLCO -Return to see [name] in 6-8 weeks or sooner if needed     SIGNATURE    Dr. Brand Males, M.D., F.C.C.P,  Pulmonary and Critical Care Medicine Staff Physician, [place] Director - Interstitial Lung Disease  Program  [facility] [place], [place], [address]  Pager: [phone], If no answer or between  15:00h - 7:00h: call [phone] Telephone: [phone]  11:08 AM 01/24/2021

## 2021-01-24 NOTE — Progress Notes (Signed)
#IPF - uip path 03/22/13  - On OFEV since early May 2015   PFT FVC fev1 ratio BD fev1 TLC DLCO Walk test 172f x 3 laps wt rx             Spring 2014 2.9:L L/% /%        June 2015 2.7L/67% 2.34L/78% 86  3.68/57% 20.24/71%   ofev start  [name]  2016 2.5L/55% 2.1L/60% 84/108%    No desat, Pk HR 130    02/11/2015 Screening visit afferent cough study 2.59L/56% 2.24L/63% 87/112%    Dx with afluttter on ekg   Screen failed due to hx of stones  03/26/2015        No desat, PK HR 130    08/26/15 pft machine 2.73L/68% 2.38L/80% 87   [name]     06/29/2016 Office spiro 2.49L/56% 2.14L/65%     Pk HR 90 and lowest pulse ox 99% ->93%  ofev  11/02/2016  office full pft  2.61L/66% 2.3L/79%    17.23/60%   pfev  09/16/2017  2.53L/64%      Pulse ox 99% -> 93%, HR 81- > 84  ofev  12/22/2017        100% -> 97%,  HR 66 -> 96    09/19/2018        100% -> 93, HR 74 -> 85         OV 03/26/2015  Chief Complaint  Patient presents with  . Follow-up    [name], dry cough. CATH procedure on 04/08/15. Denies any chest tightnes/congestion.    75 year old male with idiopathic pulmonary fibrosis. He is on Ofev after having failed Esbriet in the past due to side effects. He is tolerating Ofev well. Liver function test in March 2016 was normal. I last saw him in January 2016. Subsequently in February 2016 we screened him for a cough idiopathic pulmonary fibrosis study called afferent but he screen failed due to history of renal stones. At this time he was incidentally diagnosed with new onset atrial flutter. He has seen Dr. GCrissie Sicklesand has been started on anticoagulation with Xarelto and metoprolol. He is due to undergo a ablation. He is not aware of the increased bleeding risk with Ofev in the setting of anticoagulation. He assures me that the anti-coag and is only until he undergoes ablation and after that the plan is to stop it. Therefore only short-term anticoagulation with Xarelto.  Overall his effort tolerance is fine. He has postponed his [facility] lung transplant until he completes his ablation.  Walking desaturation test in the office today he did not desaturate. This is stable. Spirometry not done   Past medical history: Atrial flutter new diagnoses as above   OV 06/29/2016  Chief Complaint  Patient presents with  . Follow-up    pt states he is at baseline: sob with exertion, nonprod cough.      75year old male with idiopathic pulmonary fibrosis. He is on Ofev . He completed 6 months of PRAISE study (([id number] placebo) in l;ate winter/early spring 2017. This is [place]. Overall stable. No worsening dyspnea and cough. Continues daily exercise is walking many miles. Some days he is sitting more than the others. He is interested in more trials and results of the praise study. These results are not out yet. He is compliant with his Ofe last liver function test was in February 2017. There are no new issues. He believes his atrial fibrillation is  in sinus rhythm;    OV  11/02/2016  Chief Complaint  Patient presents with  . Follow-up    4 month IPF follow up and B&A review - does report increased prod cough with yellow mucus, wheezing, tightness in chest, increased SOB, x3-4 weeks with the weather change.  denies any f/c/s, hemoptysis, chest pain   IPF - on ofev. Last liver function test was in July 2017 and normal. Overall dyspnea stable. However he is been having diarrhea with ofev. He called in and we reduced it to 1 tablet a day and this improved the diarrhea. He back on 1 tablet twice a day. The diarrhea has returned although it is much milder. He has never taken any medication for this. Are function test shows slight improvement from July 2017 and similar levels to approximately one year ago  The new issue that he's having significant recurrent sinus infection in the back of chronic postnasal drainage. This since the fall 2017. He says that he and his wife catch  respirator infection and passive back and forth to each other. He is having cough, chest tightness, postnasal drip and yellow sputum. It is not affecting any dyspnea. There is no wheeze. There is no fever or hemoptysis.  OV 03/02/2017  Chief Complaint  Patient presents with  . Follow-up    Pt states his breathing is at baseline. Pt c/o dry cough - pt states this is baseline. Pt denies CP/tightness.     Idiopathic pulmonary fibrosis on Ofev. Last seen November 2017. He is a routine follow-up.  He is here with his wife. His sinus infections a result. He is interested in research protocols but we have none right now. He is tolerating his Ofev associated with some mild diarrhea occasional which she takes medication. He is not much of a problem. He tells me that she walks 6 times a week for 5 miles a day. On the treadmill at a 5 incline walking at 3-1/2 miles an hour. For this he feels a little more dyspneic than before. Walking desaturation test 185 feet 3 laps on room air: His pulse ox dropped from 98% at rest and 93% with exertion. His heart rate jumped from 66 at rest and 97 at peak exertion. Features are suggestive of mild progression of the disease. He is not attending pulmonary fibrosis foundation because he felt this was negative but that was years ago. We discussed this and he might be interested again.     OV 09/16/2017  Chief Complaint  Patient presents with  . Follow-up    PFT done today. Pt states that his breathing is gradually getting worse. SOB which depends if it is on exertion vs all the time and c/o prod cough with yellow mucus. Denies any CP   S IPF on fev followup. Overall doing well. He recent bronchitis and liked anabiotic and prednisone. He felt the prednisone helped his cough just currently rated as mild to moderate in severity. Helped the shortness of breath. He also helped arthralgia. He is now wondering what taking chronic prednisone. We had an extensive discussion  about this. Spirometry shows that the FVC stable compared to earlier this year but slightly progressive compared to one year ago. [facility] interstitial lung disease questionnaire shows symptoms. His wife is here with him. He is interested in research trials. He has participated in distress before. He is asking about opioid refill for cough if I wont do prednisone.   OV 12/22/2017  Chief Complaint  Patient  presents with  . Follow-up    Pt states he believes his breathing is at a stable point right now. States that he is coughing which is worse when he first wakes up and then also at night; occ will cough up yellow phlegm.     C.o ofev intolerance with fatigue, weight loss, diarrhea. Does not like lomotil. Wnaant to give it a holiday foir 2-3 weeks. INterested in research protocol - discussed [place]. 5 year cut off since diagnosis coming up 03/22/18. He is overall frustrated ith qualtiy of life. Walks 5 miles; starts feeling dizzy > 3 miles in but not checking pulse ox despite advice. Walking desaturation test on 12/22/2017 185 feet x 3 laps on ROOM AIR:  did noit desaturate. Rest pulse ox was 100%, final pulse ox was 97%. HR response 66/min at rest to 96/min at peak exertion. Patient Brasen Mini  Did not Desaturate < 88% . Erinn Nisley yes  Desaturated </= 3% points. Melville Smyre yes did get tachyardic     Walking desaturation test on 02/01/2018 185 feet x 3 laps on ROOM AIR:  did not desaturate. Rest pulse ox was 100%, final pulse ox was 91%. HR response 67/min at rest to 85/min at peak exertion. Patient Rhylee Cornia  dod not Desaturate < 88% . Dashun Vandermeulen yes diod  Desaturated </= 3% points. Mahmud Lanter did not get tachyardic   OV 02/01/2018  Chief Complaint  Patient presents with  . Follow-up    Pt states he has been doing good since last visit. Started back on OFEV 12/24/17 after taking a holiday off of it.     FU IPF  Took ofev holiday. And then restarted 01/24/18 and doing well.  [place] ofev ok. KBILD ok.   OV 03/16/2018'  . Chief Complaint  Patient presents with  . Follow-up    PFT done today. States he has been doing well and denies any current complaints. Tolerating OFEV well and has been walking about 5 miles every day with no complaints.   Ms. Pineo returns for follow-up of idiopathic pulmonary fibrosis.  He is now back on nintedanib after giving it a holiday for a few weeks.  He is tolerating it well and no problems.  He goes for daily walks and has no problems.  His lung function was reviewed today and it shows for the first time a significant decline of greater than 200 cc between 2 time points.  This correlates with him not taking over for a few weeks nevertheless overall he is happy with his health.  He is not interested in research trials anymore.  He has a new question about travel advice encounter.  He plans an [place] from [place], [place] intrajejunal in [place] for 10 days.  This will start on May 22, 2018.  He wants to make sure it is safe for him to go.  He said he has never been on a cruise and he and his wife have a strong desire to go as part of his goals of care.   OV 05/16/2018  Chief Complaint  Patient presents with  . Follow-up    Breathing is unchanged since last OV.    Tanner Bishop , 75 y.o. , with dob 01/10/1946 and male ,Not Hispanic or Latino from [address] [place] [address] - presents to pulm ILD clinic for IPF.  He presents with his wife.  And just under 7 days he is going to embark on a [place]  cruise.  This visit is precautionary visit just before that.  Most recent visit was in April 2019.  At this point in time in terms of his IPF he feels stable.  He is compliant with full dose of 5 and is having only mild diarrhea.  Other than that he is tolerating it just fine.  He is looking forward to his cruise.  He had pulmonary function test today that shows an improvement compared to last visit and it  is very similar to summer/fall 2018 in terms of FVC and DLCO.  Although he does feel like he is coming down with an acute bronchitis and wants to take some antibiotic and prednisone I had of the trip.  We also discussed about him having prophylactic antibiotics handy.  He wants a refill on his Tussionex for cough.  At this point in time he is past 5 years of diagnosis and not eligible for research trials he is not interested in transplant although the recent pulmonary fibrosis foundation meeting he did hear from a lot of transplant patients about the individual personal experience.       OV 09/19/2018  Subjective:  Patient ID: Tanner Bishop, male , DOB: [birth year] , age 14 y.o. , MRN: [id number] , ADDRESS: [place] [id number]   09/19/2018 -   Chief Complaint  Patient presents with  . Follow-up    Pt saw cards 08/23/18. States he is still having problems with his heart going in and out of rhythm and states he has had some episodes to where he almost passes out. States his breathing is at a stable point. States he also has  an occ cough.     HPI Ramel Sisley 75 y.o. -presents 5-year follow-up.  I personally saw him in June 2019.  Then approximately a month ago he presented and saw acutely my nurse practitioner because of dizziness.  It was felt to be A. fib RVR.  He subsequently saw Dr. Crissie Sickles his electrophysiologist.  I reviewed the note.  Flecainide was started.  He says despite that l approximately 10 weeks ago he had another episode of dizziness while getting out of the shower.  He was tachycardic with a heart rate of 140s.  He felt he might pass out but there is no focal neurologic deficits or abnormal sensation.  His pulse ox was normal at 97% at that time.  It happened at rest.  He says he has been walking on the treadmill for several miles and he does not desaturate.  He says his pulse ox is 93% at that time.  He never drops into the 80s.  He does not think his dizziness  and palpitation issues are related to his pulmonary fibrosis.  However [name] is wondering about oxygen drops during this time.  He is not tolerating his nintedanib at full dose without any problems.  He has had a successful [place] trip.  He is participating in the patient support group.  He is up-to-date with his flu shot      OV 10/13/2018  Subjective:  Patient ID: Tanner Bishop, male , DOB: 06/21/1946 , age 46 y.o. , MRN: [id number] , ADDRESS: [place] [id number]   10/13/2018 -   Chief Complaint  Patient presents with  . Follow-up    review PFT.  c/o baseline sob with exertion.       HPI Llewellyn Freshour 75 y.o. -presents for follow-up of IPF to the ILD clinic.  This visit is arranged because letter physiologist [name] was concerned about hypoxemia effects on his atrial fibrillation.  At this point in time patient tells me that after increasing dose of flecainide his atrial fibrillation and palpitations have improved and resolved.  Overall he feels stable.  On September 26, 2018 he had 6-minute walk test and this was pretty robust with walking 384 meters and with lowest pulse ox of 95%.  Then on October 05, 2018 he had overnight pulse oximetry that shows 12 minutes of desaturation less than 88%.  He is not interested in oxygen.  Then he called his October 07, 2018 with worsening bronchitis episodes and we gave him 5 days of prednisone and antibiotics.  He finished his last dose yesterday.  Overall he feels stable but pulmonary function test today shows decline in both FVC and DLCO.  And then when I questioned him he felt like he still had some residual bronchiti and feels another course of prednisone could help him.  There are no other new issues.  Last liver function test October 2019 and was normal.      OV 01/12/2019  Subjective:  Patient ID: Tanner Bishop, male , DOB: 1946-02-26 , age 57 y.o. , MRN: [id number] , ADDRESS: [place]  [id number]   01/12/2019 -   Chief Complaint  Patient presents with  . Follow-up    PFT performed today. Pt states he is about the same since last visit.States he still becomes SOB with exertion, has a lot of coughing but has been taking mucinex, and also has had some CP or chest tightness.     HPI Marsh Buxbaum 75 y.o. -returns for follow-up of his IPF.  He is now more than 5 years since diagnosis.,  April 2020 he will be 6 years since diagnosis.  He continues to exercise well on the treadmill and according to his wife he is does well.  He is not dropping oxygen at this time.  He does not have a cough when needed works on the tile.  However at other times the day especially when he is trying to lie down to bed or when he gets up in the morning and goes to the bathroom he has really bad cough.  Overall the cough severity is now 6 out of 10 in terms of how it is impacting his quality of life.  He tells me that every time he takes prednisone for the cough he feels better but then when he comes off prednisone cough gets worse.  He said multiple prednisone courses of brief duration for this chronic cough.  In addition for the last few weeks has had yellow sinus drainage and sinus headache and sinus congestion and he feels this is again making his chronic cough worse.  He is open to taking antibiotic and prednisone course.  In terms of taking nintedanib he has no side effects.  His last liver function test reviewed was in October 2019 and it was normal at that time.  Overall at this point in time he really wants good significant support for his cough in terms of affecting his quality of life.    ROS - per HPI     OV 02/16/2019  Subjective:  Patient ID: Tanner Bishop, male , DOB: 1946-11-25 , age 37 y.o. , MRN: [id number] , ADDRESS: [address] [place] [address]   02/16/2019 -   Chief Complaint  Patient presents with  . Follow-up    HRCT  and sinus CT performed 2/18. Pt states he is about the same  as last visit and states SOB is about the same. Pt still has a dry cough. Denies any complaints of CP/chest tightness.     HPI Lido Zody 75 y.o. -returns for follow-up of his IPF.  He presents with his wife.  He is here to review the test results.  He had high-resolution CT chest and CT sinus.  These were done in order to reevaluate his disease because he is having significant cough.  His high-resolution CT chest shows only mild progression in his IPF in 3 years.  However there is significant new findings of tracheobronchomalacia although there is no lung cancer.  He also has two-vessel coronary artery calcification.  These are new findings.  Since last visit and a sinus episode he is stable.  He is currently run out of his nasal steroids.  He says the pharmacy said that I declined his prescription request.  I do not recollect such a conversation.  In any event he is now going to participate in the cough study called scenic.  He has received a copy of the consent form.  He is rated.  He has an appointment for his consent visit.  It is possible that the nasal steroid is on the exclusion list but I do not have the exclusion list with me.  Anyway he is not consented.  He is not having chest pain when he does his treadmill exercises.  He works out hard.  He states he gets tachycardic.  He says he has a 6 times that he will die from his heart condition before his pulmonary fibrosis.  He has upcoming appointment with his cardiologist [name] for atrial fibrillation.  ...................................................................... OV 06/08/2019   Subjective:  Patient ID: Tanner Bishop, male , DOB: 23-Sep-1946 , age 26 y.o. , MRN: [id number] , ADDRESS: [place] [id number]   06/08/2019 -   Chief Complaint  Patient presents with  . Follow-up    1wk f/u for IPF. Patient stated that he was given a round of prednisone last week for some SOB but is feeling much better.       HPI Adelfo Lopezgarcia 75 y.o. -returns for IPF follow-up.  Last seen in March 2020.  Since the pandemic started he has been social distancing.  He has not attended any support group.  He has been tolerating his Ofev quite well without any difficulty.  Recently had a bronchitis exacerbation and we gave prednisone.  This resolved his cough.  He was going to participate in a cough study but because of the pandemic the study got canceled.  He tells me that every time he takes prednisone the cough goes away.  He is very interested in taking daily prednisone for symptom relief of cough.  We discussed the side effects of prednisone that include weight gain, easy bruising, adrenal insufficiency, osteoporosis, cataracts hypertension, diabetes, immunosuppression.  Despite all this he wants to take a low-dose prednisone daily.  His last liver function test was in March 2020.  This needs to be retested because he is on nintedanib.  His symptom scores at walk test show relative stability.  His last pulmonary function test was in January 2020 but because of the pandemic is on hold.  We are using symptom and walk test to determine progression.         OV 04/11/2020  Subjective:  Patient ID: Tanner Bishop, male , DOB: 03/07/1946 ,  age 58 y.o. , MRN: [id number] , ADDRESS: [address] [place] [address]   04/11/2020 -   Chief Complaint  Patient presents with  . Follow-up    PFT performed today. Pt states he feels like his breathing is gradually becoming worse and states it could be at any time that he is SOB.   #IPF - uip path 03/22/13  - On OFEV since early May 2015 -On chronic daily prednisone because of cough since 2020  HPI Banyan Suthers 75 y.o. -presents with his wife IPF follow-up.  Last saw him June 2020.  After that he feels his dyspnea is worse.  He says that he could do several miles in one 1 hour and 30 minutes on the treadmill.  The same distance and now taking 1 hour and 40 minutes.  He  feels his disease is getting worse.  He says the prednisone is working well for him because of improved cough and wellbeing.  He is having some skin bruising because of that.  The nintedanib he is tolerating well except for some mild diarrhea.  On objective symptom score is actually similar to 1 year ago but may be slightly worse than 6 months ago or 9 months ago.  On pulmonary function testing he is stable compared to 1 year ago but slow and steadily he is definitely worse over the many years.  We noticed on his walking desaturation test that he did not mount a tachycardic response as he is done in the past.  He is on flecainide for atrial fibrillation.  He takes his 3 times daily.  He feels his atrial fibrillation is under good control.  There is no chest pain.  There is no weight loss.  Is no loss of physical conditioning.  He is interested in clinical trials.   A month ago he had nausea and his amylase/lipase was elevated.  He says he is fine now.  Primary care address this. ROS - per HPI  IMPRESSION: 1. 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 Findings are consistent with UIP per consensus guidelines: Diagnosis of Idiopathic Pulmonary Fibrosis: An Official ATS/ERS/JRS/ALAT Clinical Practice Guideline. [place], Iss 5, [phone], Aug 14 2017. 2. 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 3. Two-vessel coronary atherosclerosis.  Aortic Atherosclerosis (ICD10-I70.0).   Electronically Signed   By: Ilona Sorrel M.D.   On: 01/31/2019 12:24  OV 06/11/2020  Subjective:  Patient ID: Tanner Bishop, male , DOB: Mar 25, 1946 , age 75 y.o. , MRN: [id number] , ADDRESS: [place] [id number]   06/11/2020 -   Chief Complaint  Patient presents with  . Follow-up    PFT performed today.  Pt states he has been doing good since  last visit. States breathing is about the same.,    #IPF - uip path 03/22/13  - On OFEV since early May 2015 -On chronic daily prednisone because of cough since 2020  HPI Marlyn Coval 75 y.o. -returns to clinic for ILD follow-up.  In the interim he feels that his wife has worsening dementia.  She is able to self-care but only does minimal cooking.  She is more irritable.  His daughter Theadora Rama is now on disability because of back issues.  Patient has a son in [place] who he is close with.  Together they take care of his wife.  He says he has become  the primary caregiver now for his wife.  Nevertheless she is functional and does some ADLs.  He continues with nintedanib 150 mg twice daily.  Overall tolerance is good just mild diarrhea.  This is baseline.  Shortness of breath is baseline symptom score is 14 and similar to the past.  Pulmonary function test appears stable.  Walking desaturation test is stable.  His last liver function test was 2 months ago.  His main issue is that he is having some financial issues covering the cost of nintedanib.  He $6000 grant is running out.  He only has 1 month supply left.  He wants to meet with the pharmacist.      OV 11/12/2020   Subjective:  Patient ID: Tanner Bishop, male , DOB: Mar 16, 1946, age 65 y.o. years. , MRN: [id number],  ADDRESS: [place] [address] PCP  [name], MD Providers : Treatment Team:  Attending Provider: Brand Males, MD Patient Care Team: [name], MD as PCP - Irineo Axon, MD as Consulting Physician (Pulmonary Disease) [place], Anne Ng, [facility] as Pharmacist (Pharmacist)    Chief Complaint  Patient presents with  . Follow-up    IPF, still getting SOB on DOE with some chest tightness       HPI Kden Macfarlane 75 y.o. -IPF follow-up.  Presents with his wife who I am seeing after a long time.  According to the patient patient himself is stable from IPF standpoint.  However he  says for the last several months he has had an exacerbation in his chronic back pain.  He has significant pain.  He has had steroid injections.  The first 1 was successful but the second 1 and subsequent failed.  He is not able to do his walks as he used to in the past.  He has gained some weight.  He is frustrated by this.  He is asking if he can go see a Restaurant manager, fast food.  The other issue with him was elevated amylase.  We never found a cause for this.  So his primary care physician worked him up he had abdominal CT imaging on 08/16/2020 and this was normal.  Otherwise he is okay  He is asking for a refill on his prednisone that he takes for cough.  He also wants Tussionex as needed.  He takes that for cough.  He also says that he got summoned for jury duty.  He is wondering what to do.  I told him under no circumstances he should ever do jury duty.  I worry about the risk   ROS - per HPI  OV 01/24/2021  Subjective:  Patient ID: Tanner Bishop, male , DOB: 1946/05/18 , age 57 y.o. , MRN: [id number] , ADDRESS: [address] [place] [address] PCP [name], MD Patient Care Team: [name], MD as PCP - General Brand Males, MD as Consulting Physician (Pulmonary Disease) [place], Anne Ng, [facility] as Pharmacist (Pharmacist)  This Provider for this visit: Treatment Team:  Attending Provider: Brand Males, MD    01/24/2021 -   Chief Complaint  Patient presents with  . Acute Visit    Increased SOB since last Friday, heart still feels "funny"   Follow-up idiopathic pulmonary fibrosis On nintedanib and low-dose prednisone for cough Idiopathic elevation in amylase unrelated to nintedanib.  Normal abdominal imaging.  HPI Truth Hemmer 75 y.o. -returns for an acute visit.  On Friday, 01/22/2021 he says he got up and he felt like baseline and  then when he walked he suddenly noticed that he was suddenly way more short of breath than usual.  His heart started feeling funny.  He took  an acute visit with cardiology.  I reviewed the notes.  I do not see an EKG but he tells me an EKG was done and he was in sinus rhythm.  He had normal troponin, normal BNP and normal D-dimer.  Chest x-ray was baseline.  I personally visualized this.  Therefore he was sent to pulmonary.  We are seeing him as an acute visit today.  His symptom scores are definitely worse than baseline.  He is tolerating his nintedanib and his weight is stable.  His walking desaturation test shows a sudden decline.  For the first time his pulse ox is below 88%.  His pace of walking was much slower.  He tells me he still feels funny in his chest.  There are no Covid symptoms.  He is fully vaccinated.  There is no edema orthopnea proximal nocturnal dyspnea coughing or wheezing.  There is no sputum or fever production.  His wife is here with him..      SYMPTOM SCALE - ILD 02/16/2019  06/08/2019  04/11/2020  06/11/2020  11/12/2020 169# weight 01/24/2021 171#  O2 use _0  ra  Shortness of Breath 0 -> 5 scale with 5 being worst (score 6 If unable to do)       At rest 1 1 0 _1 Simple tasks - showers, clothes change, eating, shaving _2 Household (dishes, doing bed, laundry) _3 Shopping _4 Walking level at own pace _5 Walking up Stairs _6 Total (40 - 48) Dyspnea Score _7 How bad is your cough? 3 Xx - due to recent pred _8 How bad is your fatigue 3 x _9 nausea   0 0 0 0  vomit   0 0 0 0  diarrhea   _10 anxiety   0 0 0 0  deopresso   0 0 0 0       Simple office walk 185 feet x  3 laps goal with forehead probe 09/19/2018  01/12/2019  02/16/2019  06/08/2019  04/11/2020  06/11/2020  11/12/2020  01/24/2021   O2 used Room air Room air Room air Room air  ra ra ra  Number laps completed _11 Comments about pace normal normal normal normal  Mod pace avg [ace Slow pace  Resting Pulse Ox/HR 100% and 73/min 100% ad  74/min 100% and 68/min 98% and 82/min 99% and 60/min 98% and 54 98% and 73/min 94% and 88/min  Final Pulse Ox/HR 93% and 85/min 92% and 84/min 93% and 81/min 91% and 96/mni 91% and 74/mm 90% and 71/min 90% and 86/min 87% and 95/min  Desaturated </= 88% no no no no yes yes yes yes  Desaturated <= 3% points Yes, 7 poin Yes, 8 ponts Yes, 7 points Yes, 7 points Yes, 8point Yes, 8 point Yes, 8 points Yes, 7 points  Got Tachycardic >/= 90/min no no no yes      Symptoms at end of test x none none none dyspnea none None dyspnea dyspnea  Miscellaneous comments  x x stable stable but tachy for first time         Results for AADEN, BUCKMAN (MRN [id number]) as of 04/11/2020 12:14  Ref. Range 05/14/2014 15:46 07/24/2015 12:43 08/26/2015 10:52 11/02/2016 09:51 05/19/2017 08:41 09/16/2017 08:47 03/16/2018 08:48 05/16/2018 10:53 10/13/2018 09:52 01/12/2019 09:49 04/11/2020 10:56 06/11/2020   FVC-Pre Latest Units: L 2.71 2.69 2.73 2.61 2.51 2.53 2.29 2.38 2.25 2.21 2.23 2.26  FVC-%Pred-Pre Latest Units: % _0 56 57%  Results for ANGELO, PRINDLE (MRN [id number]) as of 04/11/2020 12:14  Ref. Range 05/14/2014 15:46 07/24/2015 12:43 08/26/2015 10:52 11/02/2016 09:51 05/19/2017 08:41 09/16/2017 08:47 03/16/2018 08:48 05/16/2018 10:53 10/13/2018 09:52 01/12/2019 09:49 04/11/2020 10:56 06/11/2020   DLCO unc Latest Units: ml/min/mmHg 20.24 15.36 15.36 17.23 15.83  13.10 16.48 14.17 14.38 14.72 13.69  DLCO unc % pred Latest Units: % 71 54 54 60 55  46 58 50 50 62 58%        CT Chest data  DG Chest 2 View  Result Date: 01/23/2021 CLINICAL DATA:  Shortness of breath cardiac palpitations EXAM: CHEST - 2 VIEW COMPARISON:  12/01/2016 FINDINGS: Cardiac shadow is at the upper limits of normal in size but stable. Lungs are well aerated bilaterally with mild fibrotic scarring stable in appearance from the prior exam. No new focal infiltrate or sizable effusion is seen. No acute bony abnormality is noted. IMPRESSION: Chronic  scarring without acute abnormality. Electronically Signed   By: Inez Catalina M.D.   On: 01/23/2021 08:24   CT Chest High Resolution  Result Date: 01/24/2021 CLINICAL DATA:  IPF EXAM: CT CHEST WITHOUT CONTRAST TECHNIQUE: Multidetector CT imaging of the chest was performed following the standard protocol without intravenous contrast. High resolution imaging of the lungs, as well as inspiratory and expiratory imaging, was performed. COMPARISON:  01/31/2019, 03/11/2016, 07/24/2015, 02/14/2013 FINDINGS: Cardiovascular: Aortic atherosclerosis. Cardiomegaly. Scattered left coronary artery calcifications. Enlargement of the main pulmonary artery measuring up to 3.4 cm in caliber. No pericardial effusion. Mediastinum/Nodes: No enlarged mediastinal, hilar, or axillary lymph nodes. Thyroid gland, trachea, and esophagus demonstrate no significant findings. Lungs/Pleura: Redemonstrated moderate pulmonary fibrosis in a pattern with apical to basal gradient, featuring traction bronchiectasis, irregular peripheral interstitial opacity, septal thickening, subpleural bronchiolectasis, and areas of honeycombing at the lung bases. Fibrotic findings are minimally worsened compared to prior examination dated 01/31/2019 and further slightly worsened over a longer period of follow-up dating back to 02/14/2013. No significant air trapping on expiratory phase imaging. Evidence of prior left lung wedge biopsy. No pleural effusion or pneumothorax. Upper Abdomen: No acute abnormality. Musculoskeletal: No chest wall mass or suspicious bone lesions identified. IMPRESSION: 1. Redemonstrated moderate pulmonary fibrosis in a pattern with apical to basal gradient, featuring traction bronchiectasis, irregular peripheral interstitial opacity, septal thickening, subpleural bronchiolectasis, and areas of honeycombing at the lung bases. Fibrotic findings are minimally worsened compared to prior examination dated 01/31/2019 and further slightly  worsened over a longer period of follow-up dating back to 02/14/2013. Findings are in a UIP pattern and in keeping with reported diagnosis of IPF. 2. Cardiomegaly and coronary artery disease. 3. Enlargement of the main pulmonary artery, as can be seen in pulmonary hypertension. Aortic Atherosclerosis (ICD10-I70.0). Electronically Signed   By: Eddie Candle M.D.   On: 01/24/2021 09:16      PFT  PFT Results Latest Ref Rng & Units 06/11/2020 04/11/2020 01/12/2019 10/13/2018 05/16/2018 03/16/2018 09/16/2017  FVC-Pre L 2.26 2.23 2.21 2.25 2.38 2.29 2.53  FVC-Predicted  Pre % 57 56 56 57 60 58 64  FVC-Post L - - 2.25 - - - -  FVC-Predicted Post % - - 57 - - - -  Pre FEV1/FVC % % 88 89 87 86 88 88 88  Post FEV1/FCV % % - - 90 - - - -  FEV1-Pre L 2.00 1.99 1.93 1.93 2.09 2.02 2.23  FEV1-Predicted Pre % 70 69 67 67 73 70 77  FEV1-Post L - - 2.03 - - - -  DLCO uncorrected ml/min/mmHg 13.69 14.72 14.38 14.17 16.48 13.10 -  DLCO UNC% % 58 62 50 50 58 46 -  DLCO corrected ml/min/mmHg 13.69 14.76 - - - 13.14 -  DLCO COR %Predicted % 58 62 - - - 46 -  DLVA Predicted % 96 110 102 94 110 94 -  TLC L - - - - - - -  TLC % Predicted % - - - - - - -  RV % Predicted % - - - - - - -       has a past medical history of Allergy, Arthritis, Atrial flutter (HCC), Cataract, GERD (gastroesophageal reflux disease), H/O hiatal hernia, Hyperlipidemia, Injury of right hand, Pulmonary fibrosis ([place]), Renal stone (06/2014), and Ulcer.   reports that he quit smoking about 42 years ago. His smoking use included cigarettes. He has a 16.00 pack-year smoking history. He has never used smokeless tobacco.  Past Surgical History:  Procedure Laterality Date  . ATRIAL FLUTTER ABLATION N/A 04/08/2015   Procedure: ATRIAL FLUTTER ABLATION;  Surgeon: Evans Lance, MD;  Location: [facility] CATH LAB;  Service: Cardiovascular;  Laterality: N/A;  . CATARACT EXTRACTION, BILATERAL  2016  . COLONOSCOPY  03/01/2017   per Dr. Loletha Carrow, clear, repeat in 5  yrs (brother had colon cancer)   . ESOPHAGOGASTRODUODENOSCOPY  2007  . EXTRACORPOREAL SHOCK WAVE LITHOTRIPSY  06-17-14   per Dr. Jeffie Pollock   . HAND SURGERY Right   . LUNG BIOPSY Left 03/22/2013   Procedure: LUNG BIOPSY;  Surgeon: Melrose Nakayama, MD;  Location: [facility];  Service: Thoracic;  Laterality: Left;  . POLYPECTOMY    . UPPER GASTROINTESTINAL ENDOSCOPY    . VIDEO ASSISTED THORACOSCOPY Left 03/22/2013   Procedure: VIDEO ASSISTED THORACOSCOPY;  Surgeon: Melrose Nakayama, MD;  Location: [facility];  Service: Thoracic;  Laterality: Left;    No Known Allergies  Immunization History  Administered Date(s) Administered  . Fluad Quad(high Dose 65+) 08/25/2019  . Influenza Split 09/13/2013  . Influenza, High Dose Seasonal PF 10/06/2016, 09/16/2017, 08/17/2018  . Influenza-Unspecified 09/07/2014, 09/14/2015, 09/13/2020  . PFIZER(Purple Top)SARS-COV-2 Vaccination 02/12/2020, 03/12/2020, 08/14/2020  . Pneumococcal Conjugate-13 02/16/2017  . Pneumococcal Polysaccharide-23 05/26/2013  . Td 12/15/2007  . Tdap 02/23/2018  . Zoster 02/19/2014  . Zoster Recombinat (Shingrix) 10/13/2018, 07/25/2019    Family History  Problem Relation Age of Onset  . Liver cancer Mother   . Diabetes Sister   . Lung cancer Sister   . Heart disease Brother   . Diabetes Brother   . Colon cancer Brother   . Diabetes Other   . Cancer Other        prostate  . Colon polyps Neg Hx   . Esophageal cancer Neg Hx   . Rectal cancer Neg Hx   . Stomach cancer Neg Hx      Current Outpatient Medications:  .  chlorpheniramine-HYDROcodone (TUSSIONEX PENNKINETIC ER) 10-8 MG/5ML SUER, Take 5 mLs by mouth 2 (two) times daily as needed for cough (Do  not take with other sedating medication)., Disp: 140 mL, Rfl: 0 .  diphenhydrAMINE (BENADRYL) 25 MG tablet, Take 25 mg by mouth every 6 (six) hours as needed for allergies or sleep. Patient reports taking at bedtime, Disp: , Rfl:  .  flecainide (TAMBOCOR) 50 MG tablet, Take 1  tablet (50 mg total) by mouth 3 (three) times daily., Disp: 270 tablet, Rfl: 3 .  fluticasone (FLONASE) 50 MCG/ACT nasal spray, SPRAY 2 SPRAYS INTO EACH NOSTRIL EVERY DAY, Disp: 48 mL, Rfl: 5 .  gabapentin (NEURONTIN) 300 MG capsule, Take 1 capsule (300 mg total) by mouth 3 (three) times daily., Disp: 90 capsule, Rfl: 5 .  OFEV 150 MG CAPS, TAKE 1 CAPSULE BY MOUTH TWICE DAILY WITH FOOD., Disp: 60 capsule, Rfl: 10 .  pantoprazole (PROTONIX) 40 MG tablet, Take 1 tablet (40 mg total) by mouth 2 (two) times daily., Disp: 180 tablet, Rfl: 3 .  predniSONE (DELTASONE) 5 MG tablet, TAKE 1 TABLET BY MOUTH EVERY DAY WITH BREAKFAST, Disp: 30 tablet, Rfl: 3 .  zolpidem (AMBIEN) 5 MG tablet, TAKE 1 TABLET BY MOUTH EVERY DAY AT BEDTIME AS NEEDED FOR SLEEP, Disp: 30 tablet, Rfl: 5  Current Facility-Administered Medications:  .  0.9 %  sodium chloride infusion, 500 mL, Intravenous, Continuous, Danis, Estill Cotta III, MD      Objective:   Vitals:   01/24/21 1007  BP: 112/78  Pulse: 88  Temp: (!) 97.2 F (36.2 C)  TempSrc: Oral  SpO2: 94%  Weight: 171 lb (77.6 kg)  Height: _0  (1.702 m)    Estimated body mass index is 26.78 kg/m as calculated from the following:   Height as of this encounter: _1  (1.702 m).   Weight as of this encounter: 171 lb (77.6 kg).  _2 @  Filed Weights   01/24/21 1007  Weight: 171 lb (77.6 kg)     Physical Exam General: No distress. Looks anxious Neuro: Alert and Oriented x 3. GCS 15. Speech normal Psych: Pleasant Resp:  Barrel Chest - no.  Wheeze - no, Crackles - yes, No overt respiratory distress CVS: Normal heart sounds. Murmurs - no Ext: Stigmata of Connective Tissue Disease - no HEENT: Normal upper airway. PEERL +. No post nasal drip        Assessment:       ICD-10-CM   1. IPF (idiopathic pulmonary fibrosis) ([place])  J84.112 Ambulatory Referral for DME    Pulse oximetry, overnight    Pulmonary function test  2. Exercise hypoxemia  R09.02  Ambulatory Referral for DME    Pulmonary function test       Plan:     Patient Instructions     ICD-10-CM   1. IPF (idiopathic pulmonary fibrosis) ([place])  J84.112   2. Exercise hypoxemia  R09.02    IPF is definitely progressive.  Now desaturating with exertion There might be associated pulmonary hypertension as well There is no clear-cut evidence of IPF flareup but would not be a bad idea to treat you for 1  Plan - Please take Take prednisone 40m once daily x 3 days, then 37monce daily x 3 days, then 20102mnce daily x 3 days, then prednisone 4m4mce daily  x 3 days and then baseline dosing to continue   -I have written to cardiology to make sure you get echocardiogram  -Have written to cardiology to make sure you get a right heart catheterization -and if you do have pulmonary hypertension: Then you qualify for inhaled treprostinil  which could help your shortness of breath and walking distance  -Start portable oxygen with exertion -Test overnight oxygen desaturation study in room air -if you drop then you qualify for night oxygen  -Using oxygen can help alleviate his symptoms  -Continue nintedanib  -I think you will have to consider clinical trials as a care option in the next few months  -We will see what study might be of potential inclusion/exclusion for you  Follow-up -6-8 weeks to spirometry and DLCO -Return to see [name] in 6-8 weeks or sooner if needed     SIGNATURE    Dr. Brand Males, M.D., F.C.C.P,  Pulmonary and Critical Care Medicine Staff Physician, [place] Director - Interstitial Lung Disease  Program  Pulmonary [place] [facility] [place], [place], [address]  Pager: [phone], If no answer or between  15:00h - 7:00h: call [phone] Telephone: [phone]  11:08 AM 01/24/2021

## 2021-01-24 NOTE — Telephone Encounter (Signed)
Dear Tanner Bishop; -his IPF has been progressive.  However does not fully explain the sudden deterioration.  Plan -Could you please ensure that he gets a 2D echocardiogram.  He said it is ordered but I do not see a schedule -Could you also please refer him to Dr. Loralie Champagne or Dr. Glori Bickers for right heart catheterization or really anyone who does right heart catheterization.  If he has elevated pulmonary artery pressures he will qualify for WHO group 3 inhaled treprostinil because he also has IPF [his CT chest showed enlarged pulmonary arteries]  -If this can happen sooner that is great     SIGNATURE    Dr. Brand Males, M.D., F.C.C.P,  Pulmonary and Critical Care Medicine Staff Physician, [place] Director - Interstitial Lung Disease  Program  Pulmonary [place] [facility] [place], [place], [address]  Pager: [phone], If no answer  OR between  19:00-7:00h: page [phone] Telephone (clinical office): [phone] Telephone (research): [phone]  11:00 AM 01/24/2021

## 2021-01-24 NOTE — Telephone Encounter (Signed)
Renee  Thanks. Yes there is urgency because his IPF has taken a turn for worse and his only option is  Inhaled teprostinil (for who 3 PAH) as standard of care and a phase 3 trial as care option. So echo next week would be best. If no PAH there then we can hold off [place] referral  We are trying to get him into trial ASAP. THere is an infusion trial

## 2021-01-24 NOTE — Patient Instructions (Addendum)
ICD-10-CM   1. IPF (idiopathic pulmonary fibrosis) ([facility])  J84.112 Ambulatory Referral for DME    Pulse oximetry, overnight    Pulmonary function test  2. Exercise hypoxemia  R09.02 Ambulatory Referral for DME    Pulmonary function test  3. Chronic cough  R05.3   4. Enlarged pulmonary artery (HCC)  I28.8    IPF is definitely progressive.  Now desaturating with exertion There might be associated pulmonary hypertension as well There is no clear-cut evidence of IPF flareup but would not be a bad idea to treat you for 1  Plan - Please take Take prednisone 36m once daily x 3 days, then 344monce daily x 3 days, then 206mnce daily x 3 days, then prednisone 86m69mce daily  x 3 days and then baseline dosing to continue   -I have written to cardiology to make sure you get echocardiogram  -Have written to cardiology to make sure you get a right heart catheterization -and if you do have pulmonary hypertension: Then you qualify for inhaled treprostinil which could help your shortness of breath and walking distance  -Start portable oxygen with exertion -Test overnight oxygen desaturation study in room air -if you drop then you qualify for night oxygen  -Using oxygen can help alleviate his symptoms  -Continue nintedanib  -I think you will have to consider clinical trials as a care option in the next few months  -We will see what study might be of potential inclusion/exclusion for you  Follow-up -6-8 weeks to spirometry and DLCO -Return to see Dr. RamaChase Caller6-8 weeks or sooner if needed

## 2021-01-26 DIAGNOSIS — R55 Syncope and collapse: Secondary | ICD-10-CM | POA: Diagnosis not present

## 2021-01-27 ENCOUNTER — Ambulatory Visit: Payer: PPO

## 2021-01-28 ENCOUNTER — Telehealth: Payer: Self-pay | Admitting: Internal Medicine

## 2021-01-28 DIAGNOSIS — J84112 Idiopathic pulmonary fibrosis: Secondary | ICD-10-CM

## 2021-01-28 DIAGNOSIS — R0902 Hypoxemia: Secondary | ICD-10-CM

## 2021-01-28 NOTE — Addendum Note (Signed)
Addended by: Maren Beach, Cyncere Sontag A on: 01/28/2021 08:49 AM   Modules accepted: Orders

## 2021-01-28 NOTE — Telephone Encounter (Signed)
Called and spoke with pt and he stated that he is not happy with [employer].  He stated that they did come out and fix the machine.    He would like to have a POC to use when he is out.  He stated that he is not able to carry the tanks as they are way to heavy for him to handle on his shoulders.  MR please advise. Thanks

## 2021-01-29 DIAGNOSIS — G4730 Sleep apnea, unspecified: Secondary | ICD-10-CM | POA: Diagnosis not present

## 2021-01-29 DIAGNOSIS — R0683 Snoring: Secondary | ICD-10-CM | POA: Diagnosis not present

## 2021-01-29 NOTE — Telephone Encounter (Signed)
Yes this is fine.

## 2021-01-29 NOTE — Telephone Encounter (Signed)
Spoke with patient. He stated that [employer] did come to his house yesterday and addressed his needs. He wishes to remain with [employer] for now. He called [employer] yesterday and they advised him that he does qualify for a POC since he is only 2L of O2. He would like to have an order sent to [employer] to switch from the heavy tanks to a POC.   MR, please advise if you are ok with this order for the POC. Thanks!

## 2021-01-29 NOTE — Telephone Encounter (Signed)
Okay  -To change DME company from [employer] to [facility]   -If he is using or needing above a certain amount of oxygen Medicare rules is that they will take the POC away.  There is no exception to this rule.  No Dr. Lemont Fillers to this rule.  It is a Medicare rule.  So it might be that he might have to buy a POC himself directly out-of-pocket by cash  -But at first we can just start from changing from [employer] to [facility]

## 2021-01-29 NOTE — Telephone Encounter (Signed)
Order placed for pt to be provided a POC from [employer]. Attempted to call pt to let him know this  Had been done but unable to reach. Left pt a detailed message letting him know that we were placing order for POC. Nothing further needed.

## 2021-02-03 DIAGNOSIS — M9902 Segmental and somatic dysfunction of thoracic region: Secondary | ICD-10-CM | POA: Diagnosis not present

## 2021-02-03 DIAGNOSIS — M9903 Segmental and somatic dysfunction of lumbar region: Secondary | ICD-10-CM | POA: Diagnosis not present

## 2021-02-03 DIAGNOSIS — M9905 Segmental and somatic dysfunction of pelvic region: Secondary | ICD-10-CM | POA: Diagnosis not present

## 2021-02-03 DIAGNOSIS — M62830 Muscle spasm of back: Secondary | ICD-10-CM | POA: Diagnosis not present

## 2021-02-05 ENCOUNTER — Other Ambulatory Visit: Payer: Self-pay

## 2021-02-05 ENCOUNTER — Ambulatory Visit (HOSPITAL_COMMUNITY): Payer: PPO | Attending: Cardiology

## 2021-02-05 DIAGNOSIS — R0602 Shortness of breath: Secondary | ICD-10-CM | POA: Diagnosis not present

## 2021-02-05 DIAGNOSIS — R55 Syncope and collapse: Secondary | ICD-10-CM

## 2021-02-05 DIAGNOSIS — R002 Palpitations: Secondary | ICD-10-CM | POA: Insufficient documentation

## 2021-02-05 DIAGNOSIS — I471 Supraventricular tachycardia: Secondary | ICD-10-CM | POA: Diagnosis not present

## 2021-02-05 LAB — ECHOCARDIOGRAM COMPLETE
Area-P 1/2: 2.45 cm2
MV M vel: 4.63 m/s
MV Peak grad: 85.7 mmHg
Radius: 0.6 cm
S' Lateral: 3.3 cm

## 2021-02-06 ENCOUNTER — Ambulatory Visit: Payer: PPO | Admitting: Physical Therapy

## 2021-02-06 ENCOUNTER — Ambulatory Visit (INDEPENDENT_AMBULATORY_CARE_PROVIDER_SITE_OTHER): Payer: PPO | Admitting: Pharmacist

## 2021-02-06 DIAGNOSIS — I480 Paroxysmal atrial fibrillation: Secondary | ICD-10-CM | POA: Diagnosis not present

## 2021-02-06 DIAGNOSIS — E782 Mixed hyperlipidemia: Secondary | ICD-10-CM | POA: Diagnosis not present

## 2021-02-06 NOTE — Progress Notes (Signed)
Chronic Care Management Pharmacy Note  02/27/2021 Name:  Tanner Bishop MRN:  [id number] DOB:  [birth year]  Subjective: Tanner Bishop is an 75 y.o. year old male who is a primary patient of [name], MD.  The CCM team was consulted for assistance with disease management and care coordination needs.    Engaged with patient by telephone for follow up visit in response to provider referral for pharmacy case management and/or care coordination services.   Consent to Services:  The patient was given information about Chronic Care Management services, agreed to services, and gave verbal consent prior to initiation of services.  Please see initial visit note for detailed documentation.   Patient Care Team: [name], MD as PCP - Irineo Axon, MD as Consulting Physician (Pulmonary Disease) Viona Gilmore, [facility] as Pharmacist (Pharmacist)  Recent office visits: 12/20/20 Alysia Penna, MD: Patient presented for chronic back pain. Placed referral for PT.  11/19/20 Ofilia Neas, LPN: Patient presented for medicare annual wellness visit.   Recent consult visits: 01/24/21 Brand Males, MD (pulmonology): Patient presented for IPF follow up.  01/22/21 Tommye Standard, PA-C (cardiology): Patient presented for SOB.   01/22/21 Gar Ponto, PT (Rehab): Patient presented for PT evaluation for back pain.   01/24/21 Brand Males, MD (pulmonology): Patient presented for IPF follow up.  08/29/20 Cristopher Peru, MD (Cardiology): Patient presented for Afib follow up. Refilled flecainide.  Hospital visits: None in previous 6 months  Objective:  Lab Results  Component Value Date   CREATININE 1.07 02/18/2021   BUN 14 02/18/2021   GFR 62.38 02/29/2020   GFRNONAA 55 (L) 01/22/2021   GFRAA 64 01/22/2021   NA 147 (H) 02/21/2021   K 3.0 (L) 02/21/2021   CALCIUM 9.1 02/18/2021   CO2 26 02/18/2021    Lab Results  Component Value Date/Time   HGBA1C 6.0 01/24/2016 09:52 AM   GFR 62.38  02/29/2020 09:26 AM   GFR 61.32 02/28/2019 08:34 AM    Last diabetic Eye exam: No results found for: HMDIABEYEEXA  Last diabetic Foot exam: No results found for: HMDIABFOOTEX   Lab Results  Component Value Date   CHOL 196 02/29/2020   HDL 59.10 02/29/2020   LDLCALC 121 (H) 02/29/2020   LDLDIRECT 137.9 12/17/2010   TRIG 77.0 02/29/2020   CHOLHDL 3 02/29/2020    Hepatic Function Latest Ref Rng & Units 11/27/2020 08/05/2020 06/11/2020  Total Protein 6.0 - 8.3 g/dL 6.9 6.3 7.3  Albumin 3.5 - 5.2 g/dL 3.9 - 4.2  AST 0 - 37 U/L _0 ALT 0 - 53 U/L _1 Alk Phosphatase 39 - 117 U/L 43 - 48  Total Bilirubin 0.2 - 1.2 mg/dL 0.4 0.4 0.5  Bilirubin, Direct 0.0 - 0.3 mg/dL 0.1 0.1 0.1    Lab Results  Component Value Date/Time   TSH 2.90 02/29/2020 09:26 AM   TSH 2.22 02/28/2019 08:34 AM    CBC Latest Ref Rng & Units 02/21/2021 02/21/2021 02/21/2021  WBC 3.4 - 10.8 x10E3/uL - - -  Hemoglobin 13.0 - 17.0 g/dL 11.6(L) 11.2(L) 12.9(L)  Hematocrit 39.0 - 52.0 % 34.0(L) 33.0(L) 38.0(L)  Platelets 150 - 450 x10E3/uL - - -    Lab Results  Component Value Date/Time   VD25OH 14.78 (L) 02/28/2019 08:34 AM   VD25OH 36.13 01/24/2016 09:52 AM    Clinical ASCVD: No  The 10-year ASCVD risk score Mikey Bussing DC Jr., et al., 2013) is: 22.5%   Values used to calculate  the score:     Age: 71 years     Sex: Male     Is Non-Hispanic African American: No     Diabetic: No     Tobacco smoker: No     Systolic Blood Pressure: 332 mmHg     Is BP treated: No     HDL Cholesterol: 59.1 mg/dL     Total Cholesterol: 196 mg/dL    Depression screen [facility] 2/9 11/19/2020 02/29/2020 02/28/2019  Decreased Interest 0 0 0  Down, Depressed, Hopeless 0 0 0  PHQ - 2 Score 0 0 0  Altered sleeping 0 - -  Tired, decreased energy 0 - -  Change in appetite 0 - -  Feeling bad or failure about yourself  0 - -  Trouble concentrating 0 - -  Moving slowly or fidgety/restless 0 - -  Suicidal thoughts 0 - -  PHQ-9 Score 0  - -  Difficult doing work/chores Not difficult at all - -  Some recent data might be hidden     CHA2DS2/VAS Stroke Risk Points  Current as of yesterday     1 >= 2 Points: High Risk  1 - 1.99 Points: Medium Risk  0 Points: Low Risk    Last Change: N/A      Details    This score determines the patient's risk of having a stroke if the  patient has atrial fibrillation.       Points Metrics  0 Has Congestive Heart Failure:  No    Current as of yesterday  0 Has Vascular Disease:  No    Current as of yesterday  0 Has Hypertension:  No    Current as of yesterday  1 Age:  45    Current as of yesterday  0 Has Diabetes:  No    Current as of yesterday  0 Had Stroke:  No  Had TIA:  No  Had Thromboembolism:  No    Current as of yesterday  0 Male:  No    Current as of yesterday     Social History   Tobacco Use  Smoking Status Former Smoker  . Packs/day: 1.00  . Years: 16.00  . Pack years: 16.00  . Types: Cigarettes  . Quit date: 12/14/1978  . Years since quitting: 42.2  Smokeless Tobacco Never Used   BP Readings from Last 3 Encounters:  02/21/21 136/73  01/24/21 112/78  01/22/21 126/70   Pulse Readings from Last 3 Encounters:  02/21/21 60  01/24/21 88  01/22/21 80   Wt Readings from Last 3 Encounters:  02/21/21 163 lb (73.9 kg)  01/24/21 171 lb (77.6 kg)  01/22/21 172 lb (78 kg)    Assessment/Interventions: Review of patient past medical history, allergies, medications, health status, including review of consultants reports, laboratory and other test data, was performed as part of comprehensive evaluation and provision of chronic care management services.   SDOH:  (Social Determinants of Health) assessments and interventions performed: No   CCM Care Plan  No Known Allergies  Medications Reviewed Today    Reviewed by Jonathon Jordan, RN (Registered Nurse) on 02/21/21 at [phone]  Med List Status: Complete  Medication Order Taking? Sig Documenting Provider Last Dose  Status Informant  0.9 %  sodium chloride infusion [id number]   Doran Stabler, MD  Active   chlorpheniramine-HYDROcodone [facility]) 10-8 MG/5ML Latanya Presser [id number] Yes Take 5 mLs by mouth 2 (two) times daily as needed for cough (Do not  take with other sedating medication). Brand Males, MD Past Week Unknown time Active Self  diphenhydrAMINE (BENADRYL) 25 MG tablet [id number] Yes Take 25 mg by mouth at bedtime as needed for allergies or sleep. [provider] Past Week Unknown time Active Self  flecainide (TAMBOCOR) 50 MG tablet [id number] Yes Take 1 tablet (50 mg total) by mouth 3 (three) times daily. Evans Lance, MD 02/20/2021 Unknown time Active Self  fluticasone (FLONASE) 50 MCG/ACT nasal spray [id number] Yes SPRAY 2 SPRAYS INTO EACH NOSTRIL EVERY DAY  Patient taking differently: Place 2 sprays into both nostrils daily as needed for allergies.   Brand Males, MD Past Week Unknown time Active Self  OFEV 150 MG CAPS [id number] Yes TAKE 1 CAPSULE BY MOUTH TWICE DAILY WITH FOOD.  Patient taking differently: Take 150 mg by mouth 2 (two) times daily.   Brand Males, MD 02/20/2021 Unknown time Active Self  pantoprazole (PROTONIX) 40 MG tablet [id number] Yes Take 1 tablet (40 mg total) by mouth 2 (two) times daily. [name], MD 02/21/2021 0700 Active Self  predniSONE (DELTASONE) 5 MG tablet [id number] Yes TAKE 1 TABLET BY MOUTH EVERY DAY WITH BREAKFAST  Patient taking differently: Take 5 mg by mouth daily with breakfast.   Brand Males, MD 02/20/2021 Unknown time Active Self  predniSONE (STERAPRED UNI-PAK 21 TAB) 10 MG (21) TBPK tablet [id number] No Take by mouth daily. Take 68m for 3 days, 3105mfor 3 days, 2060mor 3 days, 57m50mr the days then back to normal dosing  Patient not taking: No sig reported   RamaBrand Males Not Taking Unknown time Consider Medication Status and Discontinue (Completed Course) Self  zolpidem (AMBIEN) 5 MG tablet [id number]  TAKE 1 TABLET BY MOUTH EVERY DAY AT BEDTIME AS NEEDED FOR SLEEP  Patient taking differently: Take 5 mg by mouth at bedtime as needed for sleep.   Fry,[name] Past Week Unknown time Active Self          Patient Active Problem List   Diagnosis Date Noted  . Paroxysmal atrial fibrillation (HCC)[place]/16/2021  . 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 Chronic left-sided low back pain with left-sided sciatica 05/22/2020  . Palpitations 07/19/2019  . Irregular heart rhythm 08/18/2018  . Dyspnea 08/18/2018  . Diarrhea 11/02/2016  . Research study patient 09/24/2015  . Atrial flutter (HCC)[place]/25/2016  . Hyperglycemia 02/27/2015  . Hyperlipidemia 02/27/2015  . Atrial flutter, unspecified 02/11/2015  . Renal stone 07/12/2014  . Personal history of colonic polyps 08/18/2013  . Family history of malignant neoplasm of gastrointestinal tract 08/18/2013  . Malar rash 07/13/2013  . Dysphagia, unspecified(787.20) 03/06/2013  . Chronic cough 02/16/2013  . GERD 01/29/2010    Immunization History  Administered Date(s) Administered  . Fluad Quad(high Dose 65+) 08/25/2019  . Influenza Split 09/13/2013  . Influenza, High Dose Seasonal PF 10/06/2016, 09/16/2017, 08/17/2018  . Influenza-Unspecified 09/07/2014, 09/14/2015, 09/13/2020  . PFIZER(Purple Top)SARS-COV-2 Vaccination 02/12/2020, 03/12/2020, 08/14/2020  . Pneumococcal Conjugate-13 02/16/2017  . Pneumococcal Polysaccharide-23 05/26/2013  . Td 12/15/2007  . Tdap 02/23/2018  . Zoster 02/19/2014  . Zoster Recombinat (Shingrix) 10/13/2018, 07/25/2019    Conditions to be addressed/monitored:  Hyperlipidemia, Atrial Fibrillation, GERD, Osteoarthritis and idiopathic pulmonary fibrosis, insomnia, sinusitis, vitamin D deficiency  Care Plan : CCM Clar[place] made by PryoViona GilmoreH Point [place] 02/27/2021 12:00 AM    Problem: Problem: Hyperlipidemia, Atrial Fibrillation, GERD, Osteoarthritis and idiopathic  pulmonary fibrosis, insomnia, sinusitis, vitamin D deficiency  Long-Range Goal: Patient-Specific Goal   Start Date: 02/05/2021  Expected End Date: 02/05/2022  This Visit's Progress: On track  Priority: High  Note:   Current Barriers:  . Unable to independently monitor therapeutic efficacy  Pharmacist Clinical Goal(s):  Marland Kitchen Patient will achieve adherence to monitoring guidelines and medication adherence to achieve therapeutic efficacy through collaboration with PharmD and provider.   Interventions: . 1:1 collaboration with [name], MD regarding development and update of comprehensive plan of care as evidenced by provider attestation and co-signature . Inter-disciplinary care team collaboration (see longitudinal plan of care) . Comprehensive medication review performed; medication list updated in electronic medical record  Hyperlipidemia: (LDL goal < 100) -Uncontrolled -Current treatment: . No medications -Medications previously tried: none  -Current dietary patterns:  trying to avoid fatty foods and avoiding cheese -Current exercise habits: patient states he walks 5 miles a day on treadmill and does light weights  -Educated on Cholesterol goals;  Benefits of statin for ASCVD risk reduction; Importance of limiting foods high in cholesterol; Exercise goal of 150 minutes per week; -Counseled on diet and exercise extensively  Atrial Fflutter (Goal: prevent stroke and major bleeding) -Controlled -CHADSVASC: 1 -Current treatment: . Rhythm control: flecainide 74m, 1 tablet three times daily . Anticoagulation: none -Medications previously tried: none -Home BP and HR readings: could not provide  -Counseled on  importance of monitoring heart rate along with BP  -Recommended to continue current medication  Osteopenia (Goal prevent fractures) -Controlled -Last DEXA Scan: 07/09/20  T-Score femoral neck: -0.6  T-Score total hip: n/a   T-Score lumbar spine: -1.7   T-Score  forearm radius: n/a  10-year probability of major osteoporotic fracture: 5.3%  10-year probability of hip fracture: 1.0% -Patient is not a candidate for pharmacologic treatment -Current treatment  . Vitamin D 1000 mg 1 tablet daily -Medications previously tried: none  -Recommend [phone] units of vitamin D daily. Recommend 1200 mg of calcium daily from dietary and supplemental sources. Recommend weight-bearing and muscle strengthening exercises for building and maintaining bone density. -Recommended repeat vitamin D level Discussed foods with calcium and recommended supplementation with at least 600 mg of calcium  GERD (Goal: minimize symptoms) -Controlled -Current treatment  . pantoprazole 484m 1 tablet twice daily -Medications previously tried: none  -Recommended to continue current medication  Chronic back pain (Goal: minimize pain) -Controlled -Current treatment  . Aleve 1 tablet twice daily -Medications previously tried: none  -Recommended alternating with Tylenol to lower risk of kidney damage  Insomnia (Goal: improve quality and quantity of sleep) -Controlled -Current treatment  . Zolpidem 5 mg 1 tablet at bedtime as needed (once every 2-3 weeks) -Medications previously tried: none  -Counseled on practicing good sleep hygiene by setting a sleep schedule and maintaining it, avoid excessive napping, following a nightly routine, avoiding screen time for 30-60 minutes before going to bed, and making the bedroom a cool, quiet and dark space Recommended trying melatonin   Health Maintenance -Vaccine gaps: none -Current therapy:  . none -Educated on Cost vs benefit of each product must be carefully weighed by individual consumer -Patient is satisfied with current therapy and denies issues - Recommended to continue as is  Patient Goals/Self-Care Activities . Patient will:  - take medications as prescribed target a minimum of 150 minutes of moderate intensity exercise  weekly  Follow Up Plan: Telephone follow up appointment with care management team member scheduled for: 6 months       Medication Assistance: None required.  Patient affirms  current coverage meets needs.  Patient's preferred pharmacy is:  [id number] [place] [place] [place]Phone: [phone]: [phone] [place]- [address] [place]Phone: [phone]: [phone] [place] [address] [place]Phone: [phone]: [phone] [place]Phone: [phone]: [phone] Uses pill box? No - takes most of them after meals  Pt endorses 100% compliance   We discussed: Current pharmacy is preferred with insurance plan and patient is satisfied with pharmacy services Patient decided to: Continue current medication management strategy  Care Plan and Follow Up Patient Decision:  Patient agrees to Care Plan and Follow-up.  Plan: Telephone follow up appointment with care management team member scheduled for:  6 months  MJeni Salles PharmD BFerdinandPharmacist LHarveyat [phone]

## 2021-02-06 NOTE — Telephone Encounter (Signed)
There is some RV dilatation. I think he needs RHC ASAP esp because Tyvaso is approved for who-group 3 now  Thanks  MR

## 2021-02-07 ENCOUNTER — Telehealth: Payer: PPO

## 2021-02-07 ENCOUNTER — Telehealth: Payer: Self-pay | Admitting: Physician Assistant

## 2021-02-07 ENCOUNTER — Other Ambulatory Visit: Payer: Self-pay | Admitting: *Deleted

## 2021-02-07 ENCOUNTER — Encounter: Payer: Self-pay | Admitting: *Deleted

## 2021-02-07 DIAGNOSIS — M9905 Segmental and somatic dysfunction of pelvic region: Secondary | ICD-10-CM | POA: Diagnosis not present

## 2021-02-07 DIAGNOSIS — M62830 Muscle spasm of back: Secondary | ICD-10-CM | POA: Diagnosis not present

## 2021-02-07 DIAGNOSIS — M9902 Segmental and somatic dysfunction of thoracic region: Secondary | ICD-10-CM | POA: Diagnosis not present

## 2021-02-07 DIAGNOSIS — I272 Pulmonary hypertension, unspecified: Secondary | ICD-10-CM

## 2021-02-07 DIAGNOSIS — M9903 Segmental and somatic dysfunction of lumbar region: Secondary | ICD-10-CM | POA: Diagnosis not present

## 2021-02-07 NOTE — Telephone Encounter (Signed)
Called patient to discuss RHC recommendation by [name]. Discussed his echo findings and rational for the procedure.  We discussed the procedure, potential risks and benefits and he is agreeable to proceed. He prefers Friday's for transportation needs, will try t get him on for next week. Discussed that my MA will call his shortly with procedure scheduling details  Tommye Standard, PA-C

## 2021-02-11 ENCOUNTER — Encounter: Payer: PPO | Admitting: Physical Therapy

## 2021-02-14 ENCOUNTER — Other Ambulatory Visit (HOSPITAL_COMMUNITY): Payer: PPO

## 2021-02-17 ENCOUNTER — Other Ambulatory Visit: Payer: PPO

## 2021-02-17 ENCOUNTER — Ambulatory Visit: Payer: PPO | Admitting: Physician Assistant

## 2021-02-17 ENCOUNTER — Other Ambulatory Visit (HOSPITAL_COMMUNITY): Payer: PPO

## 2021-02-18 ENCOUNTER — Other Ambulatory Visit (HOSPITAL_COMMUNITY)
Admission: RE | Admit: 2021-02-18 | Discharge: 2021-02-18 | Disposition: A | Discharge status: Home / Self Care | Payer: PPO | Source: Ambulatory Visit | Attending: Interventional Cardiology | Admitting: Interventional Cardiology

## 2021-02-18 ENCOUNTER — Other Ambulatory Visit: Payer: PPO | Admitting: *Deleted

## 2021-02-18 ENCOUNTER — Other Ambulatory Visit: Payer: Self-pay

## 2021-02-18 DIAGNOSIS — Z01812 Encounter for preprocedural laboratory examination: Secondary | ICD-10-CM | POA: Diagnosis not present

## 2021-02-18 DIAGNOSIS — M9905 Segmental and somatic dysfunction of pelvic region: Secondary | ICD-10-CM | POA: Diagnosis not present

## 2021-02-18 DIAGNOSIS — Z20822 Contact with and (suspected) exposure to covid-19: Secondary | ICD-10-CM | POA: Insufficient documentation

## 2021-02-18 DIAGNOSIS — M9902 Segmental and somatic dysfunction of thoracic region: Secondary | ICD-10-CM | POA: Diagnosis not present

## 2021-02-18 DIAGNOSIS — I272 Pulmonary hypertension, unspecified: Secondary | ICD-10-CM | POA: Diagnosis not present

## 2021-02-18 DIAGNOSIS — M9903 Segmental and somatic dysfunction of lumbar region: Secondary | ICD-10-CM | POA: Diagnosis not present

## 2021-02-18 DIAGNOSIS — M62830 Muscle spasm of back: Secondary | ICD-10-CM | POA: Diagnosis not present

## 2021-02-18 LAB — CBC
Hematocrit: 42.6 % (ref 37.5–51.0)
Hemoglobin: 14.3 g/dL (ref 13.0–17.7)
MCH: 31.4 pg (ref 26.6–33.0)
MCHC: 33.6 g/dL (ref 31.5–35.7)
MCV: 94 fL (ref 79–97)
Platelets: 305 10*3/uL (ref 150–450)
RBC: 4.55 x10E6/uL (ref 4.14–5.80)
RDW: 12.5 % (ref 11.6–15.4)
WBC: 10.3 10*3/uL (ref 3.4–10.8)

## 2021-02-18 LAB — BASIC METABOLIC PANEL
BUN/Creatinine Ratio: 13 (ref 10–24)
BUN: 14 mg/dL (ref 8–27)
CO2: 26 mmol/L (ref 20–29)
Calcium: 9.1 mg/dL (ref 8.6–10.2)
Chloride: 97 mmol/L (ref 96–106)
Creatinine, Ser: 1.07 mg/dL (ref 0.76–1.27)
Glucose: 102 mg/dL — ABNORMAL HIGH (ref 65–99)
Potassium: 3.9 mmol/L (ref 3.5–5.2)
Sodium: 137 mmol/L (ref 134–144)
eGFR: 73 mL/min/{1.73_m2} (ref >59)

## 2021-02-18 LAB — SARS CORONAVIRUS 2 (TAT 6-24 HRS): SARS Coronavirus 2: NEGATIVE

## 2021-02-20 ENCOUNTER — Telehealth: Payer: Self-pay | Admitting: *Deleted

## 2021-02-20 ENCOUNTER — Telehealth: Payer: Self-pay | Admitting: Internal Medicine

## 2021-02-20 NOTE — Telephone Encounter (Signed)
Pt is calling to get a refill of the tussionex.  This was last filled on 11/12/2020 for 140 ML by MR.    Last OV was 01/24/2021  MR please advise on the refill. Thanks

## 2021-02-20 NOTE — Telephone Encounter (Signed)
Pt contacted pre-right heart catheterization scheduled at [facility] for: Friday February 21, 2021 10:30 AM Verified arrival time and place: [place]) at: 8:30 AM   No solid food after midnight prior to cath, clear liquids until 5 AM day of procedure.   AM meds can be  taken pre-cath with sips of water.   Confirmed patient has responsible adult to drive home post procedure and be with patient first 24 hours after arriving home: yes  You are allowed ONE visitor in the waiting room during the time you are at the hospital for your procedure. Both you and your visitor must wear a mask once you enter the hospital.   Reviewed procedure/mask/visitor instructions with patient.

## 2021-02-20 NOTE — Telephone Encounter (Signed)
Marland Kitchen  I am off 02/21/21 for another 11 days. Can Dr Vaughan Browner (ILD) do it? HE has cough related to IPF. If not APP. I f no one will do, please call me before 10-11am on 3/11 and I will see if I Can do from remote

## 2021-02-21 ENCOUNTER — Ambulatory Visit (HOSPITAL_COMMUNITY)
Admission: RE | Admit: 2021-02-21 | Discharge: 2021-02-21 | Disposition: A | Discharge status: Home / Self Care | Payer: PPO | Source: Ambulatory Visit | Attending: Internal Medicine | Admitting: Internal Medicine

## 2021-02-21 ENCOUNTER — Encounter (HOSPITAL_COMMUNITY)
Admission: RE | Disposition: A | Discharge status: Home / Self Care | Payer: PPO | Source: Ambulatory Visit | Attending: Internal Medicine

## 2021-02-21 ENCOUNTER — Other Ambulatory Visit: Payer: Self-pay

## 2021-02-21 ENCOUNTER — Encounter (HOSPITAL_COMMUNITY): Payer: Self-pay | Admitting: Internal Medicine

## 2021-02-21 DIAGNOSIS — R0902 Hypoxemia: Secondary | ICD-10-CM | POA: Diagnosis not present

## 2021-02-21 DIAGNOSIS — Z79899 Other long term (current) drug therapy: Secondary | ICD-10-CM | POA: Insufficient documentation

## 2021-02-21 DIAGNOSIS — Z87891 Personal history of nicotine dependence: Secondary | ICD-10-CM | POA: Insufficient documentation

## 2021-02-21 DIAGNOSIS — J84112 Idiopathic pulmonary fibrosis: Secondary | ICD-10-CM | POA: Diagnosis not present

## 2021-02-21 HISTORY — PX: RIGHT HEART CATH: CATH118263

## 2021-02-21 LAB — POCT I-STAT EG7
Acid-Base Excess: 2 mmol/L (ref 0.0–2.0)
Acid-Base Excess: 0 mmol/L (ref 0.0–2.0)
Acid-Base Excess: 1 mmol/L (ref 0.0–2.0)
Bicarbonate: 28.6 mmol/L — ABNORMAL HIGH (ref 20.0–28.0)
Bicarbonate: 25.5 mmol/L (ref 20.0–28.0)
Bicarbonate: 26.6 mmol/L (ref 20.0–28.0)
Calcium, Ion: 1.19 mmol/L (ref 1.15–1.40)
Calcium, Ion: 0.86 mmol/L — CL (ref 1.15–1.40)
Calcium, Ion: 0.89 mmol/L — CL (ref 1.15–1.40)
HCT: 38 % — ABNORMAL LOW (ref 39.0–52.0)
HCT: 33 % — ABNORMAL LOW (ref 39.0–52.0)
HCT: 34 % — ABNORMAL LOW (ref 39.0–52.0)
Hemoglobin: 12.9 g/dL — ABNORMAL LOW (ref 13.0–17.0)
Hemoglobin: 11.2 g/dL — ABNORMAL LOW (ref 13.0–17.0)
Hemoglobin: 11.6 g/dL — ABNORMAL LOW (ref 13.0–17.0)
O2 Saturation: 72 %
O2 Saturation: 72 %
O2 Saturation: 73 %
Potassium: 3.7 mmol/L (ref 3.5–5.1)
Potassium: 2.9 mmol/L — ABNORMAL LOW (ref 3.5–5.1)
Potassium: 3 mmol/L — ABNORMAL LOW (ref 3.5–5.1)
Sodium: 141 mmol/L (ref 135–145)
Sodium: 147 mmol/L — ABNORMAL HIGH (ref 135–145)
Sodium: 148 mmol/L — ABNORMAL HIGH (ref 135–145)
TCO2: 30 mmol/L (ref 22–32)
TCO2: 27 mmol/L (ref 22–32)
TCO2: 28 mmol/L (ref 22–32)
pCO2, Ven: 50.5 mmHg (ref 44.0–60.0)
pCO2, Ven: 44.6 mmHg (ref 44.0–60.0)
pCO2, Ven: 46.7 mmHg (ref 44.0–60.0)
pH, Ven: 7.361 (ref 7.250–7.430)
pH, Ven: 7.363 (ref 7.250–7.430)
pH, Ven: 7.366 (ref 7.250–7.430)
pO2, Ven: 40 mmHg (ref 32.0–45.0)
pO2, Ven: 39 mmHg (ref 32.0–45.0)
pO2, Ven: 40 mmHg (ref 32.0–45.0)

## 2021-02-21 SURGERY — RIGHT HEART CATH

## 2021-02-21 MED ORDER — LIDOCAINE HCL (PF) 1 % IJ SOLN
INTRAMUSCULAR | Status: AC
  Filled 2021-02-21: qty 30

## 2021-02-21 MED ORDER — LABETALOL HCL 5 MG/ML IV SOLN: 10.0000 mg | INTRAVENOUS | Status: DC | PRN

## 2021-02-21 MED ORDER — HYDRALAZINE HCL 20 MG/ML IJ SOLN: 10.0000 mg | INTRAMUSCULAR | Status: DC | PRN

## 2021-02-21 MED ORDER — SODIUM CHLORIDE 0.9% FLUSH: 3.0000 mL | INTRAVENOUS | Status: DC | PRN

## 2021-02-21 MED ORDER — SODIUM CHLORIDE 0.9 % WEIGHT BASED INFUSION: 1.0000 mL/kg/h | INTRAVENOUS | Status: DC

## 2021-02-21 MED ORDER — SODIUM CHLORIDE 0.9 % IV SOLN: 250.0000 mL | INTRAVENOUS | Status: DC | PRN

## 2021-02-21 MED ORDER — ONDANSETRON HCL 4 MG/2ML IJ SOLN: 4.0000 mg | Freq: Four times a day (QID) | INTRAMUSCULAR | Status: DC | PRN

## 2021-02-21 MED ORDER — SODIUM CHLORIDE 0.9 % WEIGHT BASED INFUSION
3.0000 mL/kg/h | INTRAVENOUS | Status: AC
  Administered 2021-02-21: 3 mL/kg/h via INTRAVENOUS

## 2021-02-21 MED ORDER — HEPARIN (PORCINE) IN NACL 1000-0.9 UT/500ML-% IV SOLN
INTRAVENOUS | Status: AC
  Filled 2021-02-21: qty 500

## 2021-02-21 MED ORDER — SODIUM CHLORIDE 0.9% FLUSH: 3.0000 mL | Freq: Two times a day (BID) | INTRAVENOUS | Status: DC

## 2021-02-21 MED ORDER — HEPARIN (PORCINE) IN NACL 1000-0.9 UT/500ML-% IV SOLN
INTRAVENOUS | Status: DC | PRN
  Administered 2021-02-21: 500 mL

## 2021-02-21 MED ORDER — HYDROCOD POLST-CPM POLST ER 10-8 MG/5ML PO SUER: 5.0000 mL | Freq: Two times a day (BID) | ORAL | 0 refills | Status: DC | PRN

## 2021-02-21 MED ORDER — LIDOCAINE HCL (PF) 1 % IJ SOLN
INTRAMUSCULAR | Status: DC | PRN
  Administered 2021-02-21: 2 mL

## 2021-02-21 MED ORDER — FENTANYL CITRATE (PF) 100 MCG/2ML IJ SOLN
INTRAMUSCULAR | Status: DC | PRN
  Administered 2021-02-21: 25 ug via INTRAVENOUS

## 2021-02-21 MED ORDER — ACETAMINOPHEN 325 MG PO TABS: 650.0000 mg | ORAL_TABLET | ORAL | Status: DC | PRN

## 2021-02-21 MED ORDER — FENTANYL CITRATE (PF) 100 MCG/2ML IJ SOLN
INTRAMUSCULAR | Status: AC
  Filled 2021-02-21: qty 2

## 2021-02-21 SURGICAL SUPPLY — 6 items
CATH BALLN WEDGE 5F 110CM (CATHETERS) ×1 IMPLANT
PACK CARDIAC CATHETERIZATION (CUSTOM PROCEDURE TRAY) ×2 IMPLANT
SHEATH GLIDE SLENDER 4/5FR (SHEATH) ×1 IMPLANT
TRANSDUCER W/STOPCOCK (MISCELLANEOUS) ×2 IMPLANT
TUBING ART PRESS 72  MALE/FEM (TUBING) ×2
TUBING ART PRESS 72 MALE/FEM (TUBING) IMPLANT

## 2021-02-21 NOTE — Discharge Instructions (Signed)
Site Care  This sheet gives you information about how to care for yourself after your procedure. Your health care provider may also give you more specific instructions. If you have problems or questions, contact your health care provider. What can I expect after the procedure? After the procedure, it is common to have:  Bruising and tenderness at the catheter insertion area. Follow these instructions at home: Medicines  Take over-the-counter and prescription medicines only as told by your health care provider. Insertion site care  Follow instructions from your health care provider about how to take care of your insertion site. Make sure you: ? Wash your hands with soap and water before you change your bandage (dressing). If soap and water are not available, use hand sanitizer. ? Change your dressing as told by your health care provider. ? Leave stitches (sutures), skin glue, or adhesive strips in place. These skin closures may need to stay in place for 2 weeks or longer. If adhesive strip edges start to loosen and curl up, you may trim the loose edges. Do not remove adhesive strips completely unless your health care provider tells you to do that.  Check your insertion site every day for signs of infection. Check for: ? Redness, swelling, or pain. ? Fluid or blood. ? Pus or a bad smell. ? Warmth.  Do not take baths, swim, or use a hot tub until your health care provider approves.  You may shower 24-48 hours after the procedure, or as directed by your health care provider. ? Remove the dressing and gently wash the site with plain soap and water. ? Pat the area dry with a clean towel. ? Do not rub the site. That could cause bleeding.  Do not apply powder or lotion to the site. Activity  For 24 hours after the procedure, or as directed by your health care provider: ? Do not flex or bend the affected arm. ? Do not push or pull heavy objects with the affected arm. ? Do not drive  yourself home from the hospital or clinic. You may drive 24 hours after the procedure unless your health care provider tells you not to. ? Do not operate machinery or power tools.  Do not lift anything that is heavier than 10 lb (4.5 kg), or the limit that you are told, until your health care provider says that it is safe.  Ask your health care provider when it is okay to: ? Return to work or school. ? Resume usual physical activities or sports. ? Resume sexual activity.   General instructions  If the catheter site starts to bleed, raise your arm and put firm pressure on the site. If the bleeding does not stop, get help right away. This is a medical emergency.  If you went home on the same day as your procedure, a responsible adult should be with you for the first 24 hours after you arrive home.  Keep all follow-up visits as told by your health care provider. This is important. Contact a health care provider if:  You have a fever.  You have redness, swelling, or yellow drainage around your insertion site. Get help right away if:  You have unusual pain at the radial site.  The catheter insertion area swells very fast.  The insertion area is bleeding, and the bleeding does not stop when you hold steady pressure on the area.  Your arm or hand becomes pale, cool, tingly, or numb. These symptoms may represent a serious problem  that is an emergency. Do not wait to see if the symptoms will go away. Get medical help right away. Call your local emergency services (911 in the [place] Do not drive yourself to the hospital. Summary  After the procedure, it is common to have bruising and tenderness at the site.  Follow instructions from your health care provider about how to take care of your radial site wound. Check the wound every day for signs of infection.  Do not lift anything that is heavier than 10 lb (4.5 kg), or the limit that you are told, until your health care provider says that it  is safe. This information is not intended to replace advice given to you by your health care provider. Make sure you discuss any questions you have with your health care provider. Document Revised: 01/05/2018 Document Reviewed: 01/05/2018 Elsevier Patient Education  2021 [employer].

## 2021-02-21 NOTE — Interval H&P Note (Signed)
History and Physical Interval Note:  02/21/2021 10:32 AM  Tanner Bishop  has presented today for surgery, with the diagnosis of pulmonary HTN.  The various methods of treatment have been discussed with the patient and family. After consideration of risks, benefits and other options for treatment, the patient has consented to  Procedure(s): RIGHT HEART CATH (N/A) as a surgical intervention.  The patient's history has been reviewed, patient examined, no change in status, stable for surgery.  I have reviewed the patient's chart and labs.  Questions were answered to the patient's satisfaction.     Daniel Bensimhon

## 2021-02-21 NOTE — Telephone Encounter (Signed)
Called and spoke with patient to let him know that refill has been sent in for his cough syrup. He expressed understanding. Nothing further needed at this time.

## 2021-02-21 NOTE — Telephone Encounter (Signed)
Dr. Vaughan Browner, would you be ok with refilling his cough syrup since MR is not available?

## 2021-02-21 NOTE — Progress Notes (Signed)
Discharge instructions reviewed with patient and family.

## 2021-02-21 NOTE — Telephone Encounter (Signed)
OK. I refilled the cough syrup

## 2021-03-07 ENCOUNTER — Telehealth: Payer: Self-pay | Admitting: Internal Medicine

## 2021-03-07 NOTE — Telephone Encounter (Signed)
Called and spoke with pt and he stated that his insurance company advised him that MR needs to approve the POC that the pt has been approved for.  They cannot finish the process without this being approved by MR.  Pt has a pending appt with MR on Tuesday of next week.

## 2021-03-08 ENCOUNTER — Other Ambulatory Visit (HOSPITAL_COMMUNITY)
Admission: RE | Admit: 2021-03-08 | Discharge: 2021-03-08 | Disposition: A | Discharge status: Home / Self Care | Payer: PPO | Source: Ambulatory Visit | Attending: Internal Medicine | Admitting: Internal Medicine

## 2021-03-08 DIAGNOSIS — Z20822 Contact with and (suspected) exposure to covid-19: Secondary | ICD-10-CM | POA: Diagnosis not present

## 2021-03-08 DIAGNOSIS — Z01812 Encounter for preprocedural laboratory examination: Secondary | ICD-10-CM | POA: Insufficient documentation

## 2021-03-08 LAB — SARS CORONAVIRUS 2 (TAT 6-24 HRS): SARS Coronavirus 2: NEGATIVE

## 2021-03-11 ENCOUNTER — Encounter: Payer: Self-pay | Admitting: Internal Medicine

## 2021-03-11 ENCOUNTER — Ambulatory Visit: Payer: PPO | Admitting: Internal Medicine

## 2021-03-11 ENCOUNTER — Ambulatory Visit (INDEPENDENT_AMBULATORY_CARE_PROVIDER_SITE_OTHER): Payer: PPO | Admitting: Internal Medicine

## 2021-03-11 ENCOUNTER — Other Ambulatory Visit: Payer: Self-pay

## 2021-03-11 VITALS — BP 124/70 | HR 56 | Temp 97.4°F | Ht 67.5 in | Wt 175.4 lb

## 2021-03-11 DIAGNOSIS — M48062 Spinal stenosis, lumbar region with neurogenic claudication: Secondary | ICD-10-CM | POA: Diagnosis not present

## 2021-03-11 DIAGNOSIS — Z6826 Body mass index (BMI) 26.0-26.9, adult: Secondary | ICD-10-CM | POA: Diagnosis not present

## 2021-03-11 DIAGNOSIS — R0902 Hypoxemia: Secondary | ICD-10-CM

## 2021-03-11 DIAGNOSIS — J84112 Idiopathic pulmonary fibrosis: Secondary | ICD-10-CM

## 2021-03-11 LAB — PULMONARY FUNCTION TEST
DL/VA % pred: 89 %
DL/VA: 3.6 ml/min/mmHg/L
DLCO cor % pred: 48 %
DLCO cor: 11.51 ml/min/mmHg
DLCO unc % pred: 48 %
DLCO unc: 11.41 ml/min/mmHg
FEF 25-75 Pre: 2.77 L/sec
FEF2575-%Pred-Pre: 132 %
FEV1-%Pred-Pre: 64 %
FEV1-Pre: 1.81 L
FEV1FVC-%Pred-Pre: 118 %
FEV6-%Pred-Pre: 57 %
FEV6-Pre: 2.1 L
FEV6FVC-%Pred-Pre: 106 %
FVC-%Pred-Pre: 53 %
FVC-Pre: 2.1 L
Pre FEV1/FVC ratio: 86 %
Pre FEV6/FVC Ratio: 100 %

## 2021-03-11 NOTE — Patient Instructions (Addendum)
ICD-10-CM   1. IPF (idiopathic pulmonary fibrosis) ([place])  J84.112   2. Exercise hypoxemia  R09.02     IPF is definitely progressive  -  Now desaturating with exertion  -Prednisone burst did not make a difference  -Lung function study showed decline  -Right heart catheterization March 2022 is normal   Plan -  -Start portable oxygen 3 L nasal cannula with exertion -Retest overnight pulse oximetry study when he is sleeping  -Not sure what happened to the previous test done greater than 1 month ago -Continue nintedanib -I think you will have to consider clinical trials as a care option in the next few months  -We will revisit this in the summer 2022  - continue palliative measures for cough (tussionex and low dose prednisone)  Follow-up -8-12 weeks to spirometry and DLCO -Return to see [name] in 8-12 weeks or sooner if needed  -30 min slot

## 2021-03-11 NOTE — Progress Notes (Signed)
Spirometry and dlco done today. 

## 2021-03-11 NOTE — Telephone Encounter (Signed)
This was taken care of 03/11/2021  at [place]. Please make sure that patient sticks with  DME company to ensure he has deliery

## 2021-03-11 NOTE — Progress Notes (Signed)
#IPF - uip path 03/22/13  - On OFEV since early May 2015   PFT FVC fev1 ratio BD fev1 TLC DLCO Walk test 172f x 3 laps wt rx             Spring 2014 2.9:L L/% /%        June 2015 2.7L/67% 2.34L/78% 86  3.68/57% 20.24/71%   ofev start  [name]  2016 2.5L/55% 2.1L/60% 84/108%    No desat, Pk HR 130    02/11/2015 Screening visit afferent cough study 2.59L/56% 2.24L/63% 87/112%    Dx with afluttter on ekg   Screen failed due to hx of stones  03/26/2015        No desat, PK HR 130    08/26/15 pft machine 2.73L/68% 2.38L/80% 87   [name]     06/29/2016 Office spiro 2.49L/56% 2.14L/65%     Pk HR 90 and lowest pulse ox 99% ->93%  ofev  11/02/2016  office full pft  2.61L/66% 2.3L/79%    17.23/60%   pfev  09/16/2017  2.53L/64%      Pulse ox 99% -> 93%, HR 81- > 84  ofev  12/22/2017        100% -> 97%,  HR 66 -> 96    09/19/2018        100% -> 93, HR 74 -> 85         OV 03/26/2015  Chief Complaint  Patient presents with  . Follow-up    [name], dry cough. CATH procedure on 04/08/15. Denies any chest tightnes/congestion.    75year old male with idiopathic pulmonary fibrosis. He is on Ofev after having failed Esbriet in the past due to side effects. He is tolerating Ofev well. Liver function test in March 2016 was normal. I last saw him in January 2016. Subsequently in February 2016 we screened him for a cough idiopathic pulmonary fibrosis study called afferent but he screen failed due to history of renal stones. At this time he was incidentally diagnosed with new onset atrial flutter. He has seen Dr. GCrissie Sicklesand has been started on anticoagulation with Xarelto and metoprolol. He is due to undergo a ablation. He is not aware of the increased bleeding risk with Ofev in the setting of anticoagulation. He assures me that the anti-coag and is only until he undergoes ablation and after that the plan is to stop it. Therefore only short-term anticoagulation with Xarelto.  Overall his effort tolerance is fine. He has postponed his [facility] lung transplant until he completes his ablation.  Walking desaturation test in the office today he did not desaturate. This is stable. Spirometry not done   Past medical history: Atrial flutter new diagnoses as above   OV 06/29/2016  Chief Complaint  Patient presents with  . Follow-up    pt states he is at baseline: sob with exertion, nonprod cough.      75year old male with idiopathic pulmonary fibrosis. He is on Ofev . He completed 6 months of PRAISE study (([id number] placebo) in l;ate winter/early spring 2017. This is [place]. Overall stable. No worsening dyspnea and cough. Continues daily exercise is walking many miles. Some days he is sitting more than the others. He is interested in more trials and results of the praise study. These results are not out yet. He is compliant with his Ofe last liver function test was in February 2017. There are no new issues. He believes his atrial fibrillation is  in sinus rhythm;    OV  11/02/2016  Chief Complaint  Patient presents with  . Follow-up    4 month IPF follow up and B&A review - does report increased prod cough with yellow mucus, wheezing, tightness in chest, increased SOB, x3-4 weeks with the weather change.  denies any f/c/s, hemoptysis, chest pain   IPF - on ofev. Last liver function test was in July 2017 and normal. Overall dyspnea stable. However he is been having diarrhea with ofev. He called in and we reduced it to 1 tablet a day and this improved the diarrhea. He back on 1 tablet twice a day. The diarrhea has returned although it is much milder. He has never taken any medication for this. Are function test shows slight improvement from July 2017 and similar levels to approximately one year ago  The new issue that he's having significant recurrent sinus infection in the back of chronic postnasal drainage. This since the fall 2017. He says that he and his wife catch  respirator infection and passive back and forth to each other. He is having cough, chest tightness, postnasal drip and yellow sputum. It is not affecting any dyspnea. There is no wheeze. There is no fever or hemoptysis.  OV 03/02/2017  Chief Complaint  Patient presents with  . Follow-up    Pt states his breathing is at baseline. Pt c/o dry cough - pt states this is baseline. Pt denies CP/tightness.     Idiopathic pulmonary fibrosis on Ofev. Last seen November 2017. He is a routine follow-up.  He is here with his wife. His sinus infections a result. He is interested in research protocols but we have none right now. He is tolerating his Ofev associated with some mild diarrhea occasional which she takes medication. He is not much of a problem. He tells me that she walks 6 times a week for 5 miles a day. On the treadmill at a 5 incline walking at 3-1/2 miles an hour. For this he feels a little more dyspneic than before. Walking desaturation test 185 feet 3 laps on room air: His pulse ox dropped from 98% at rest and 93% with exertion. His heart rate jumped from 66 at rest and 97 at peak exertion. Features are suggestive of mild progression of the disease. He is not attending pulmonary fibrosis foundation because he felt this was negative but that was years ago. We discussed this and he might be interested again.     OV 09/16/2017  Chief Complaint  Patient presents with  . Follow-up    PFT done today. Pt states that his breathing is gradually getting worse. SOB which depends if it is on exertion vs all the time and c/o prod cough with yellow mucus. Denies any CP   S IPF on fev followup. Overall doing well. He recent bronchitis and liked anabiotic and prednisone. He felt the prednisone helped his cough just currently rated as mild to moderate in severity. Helped the shortness of breath. He also helped arthralgia. He is now wondering what taking chronic prednisone. We had an extensive discussion  about this. Spirometry shows that the FVC stable compared to earlier this year but slightly progressive compared to one year ago. [facility] interstitial lung disease questionnaire shows symptoms. His wife is here with him. He is interested in research trials. He has participated in distress before. He is asking about opioid refill for cough if I wont do prednisone.   OV 12/22/2017  Chief Complaint  Patient  presents with  . Follow-up    Pt states he believes his breathing is at a stable point right now. States that he is coughing which is worse when he first wakes up and then also at night; occ will cough up yellow phlegm.     C.o ofev intolerance with fatigue, weight loss, diarrhea. Does not like lomotil. Wnaant to give it a holiday foir 2-3 weeks. INterested in research protocol - discussed [place]. 5 year cut off since diagnosis coming up 03/22/18. He is overall frustrated ith qualtiy of life. Walks 5 miles; starts feeling dizzy > 3 miles in but not checking pulse ox despite advice. Walking desaturation test on 12/22/2017 185 feet x 3 laps on ROOM AIR:  did noit desaturate. Rest pulse ox was 100%, final pulse ox was 97%. HR response 66/min at rest to 96/min at peak exertion. Patient Tanner Bishop  Did not Desaturate < 88% . Tanner Bishop yes  Desaturated </= 3% points. Tanner Bishop yes did get tachyardic     Walking desaturation test on 02/01/2018 185 feet x 3 laps on ROOM AIR:  did not desaturate. Rest pulse ox was 100%, final pulse ox was 91%. HR response 67/min at rest to 85/min at peak exertion. Patient Tanner Bishop  dod not Desaturate < 88% . Tanner Bishop yes diod  Desaturated </= 3% points. Tanner Bishop did not get tachyardic   OV 02/01/2018  Chief Complaint  Patient presents with  . Follow-up    Pt states he has been doing good since last visit. Started back on OFEV 12/24/17 after taking a holiday off of it.     FU IPF  Took ofev holiday. And then restarted 01/24/18 and doing well.  [place] ofev ok. KBILD ok.   OV 03/16/2018'  . Chief Complaint  Patient presents with  . Follow-up    PFT done today. States he has been doing well and denies any current complaints. Tolerating OFEV well and has been walking about 5 miles every day with no complaints.   Ms. Pineo returns for follow-up of idiopathic pulmonary fibrosis.  He is now back on nintedanib after giving it a holiday for a few weeks.  He is tolerating it well and no problems.  He goes for daily walks and has no problems.  His lung function was reviewed today and it shows for the first time a significant decline of greater than 200 cc between 2 time points.  This correlates with him not taking over for a few weeks nevertheless overall he is happy with his health.  He is not interested in research trials anymore.  He has a new question about travel advice encounter.  He plans an [place] from [place] intrajejunal in [place] for 10 days.  This will start on May 22, 2018.  He wants to make sure it is safe for him to go.  He said he has never been on a cruise and he and his wife have a strong desire to go as part of his goals of care.   OV 05/16/2018  Chief Complaint  Patient presents with  . Follow-up    Breathing is unchanged since last OV.    Tanner Bishop , 75 y.o. , with dob 01/10/1946 and male ,Not Hispanic or Latino from [address] [place] [address] - presents to pulm ILD clinic for IPF.  He presents with his wife.  And just under 7 days he is going to embark on a [place]  cruise.  This visit is precautionary visit just before that.  Most recent visit was in April 2019.  At this point in time in terms of his IPF he feels stable.  He is compliant with full dose of 5 and is having only mild diarrhea.  Other than that he is tolerating it just fine.  He is looking forward to his cruise.  He had pulmonary function test today that shows an improvement compared to last visit and it  is very similar to summer/fall 2018 in terms of FVC and DLCO.  Although he does feel like he is coming down with an acute bronchitis and wants to take some antibiotic and prednisone I had of the trip.  We also discussed about him having prophylactic antibiotics handy.  He wants a refill on his Tussionex for cough.  At this point in time he is past 5 years of diagnosis and not eligible for research trials he is not interested in transplant although the recent pulmonary fibrosis foundation meeting he did hear from a lot of transplant patients about the individual personal experience.       OV 09/19/2018  Subjective:  Patient ID: Tanner Bishop, male , DOB: [birth year] , age 14 y.o. , MRN: [id number] , ADDRESS: [place] [id number]   09/19/2018 -   Chief Complaint  Patient presents with  . Follow-up    Pt saw cards 08/23/18. States he is still having problems with his heart going in and out of rhythm and states he has had some episodes to where he almost passes out. States his breathing is at a stable point. States he also has  an occ cough.     HPI Shaydon Sperbeck 75 y.o. -presents 5-year follow-up.  I personally saw him in June 2019.  Then approximately a month ago he presented and saw acutely my nurse practitioner because of dizziness.  It was felt to be A. fib RVR.  He subsequently saw Dr. Crissie Sickles his electrophysiologist.  I reviewed the note.  Flecainide was started.  He says despite that l approximately 10 weeks ago he had another episode of dizziness while getting out of the shower.  He was tachycardic with a heart rate of 140s.  He felt he might pass out but there is no focal neurologic deficits or abnormal sensation.  His pulse ox was normal at 97% at that time.  It happened at rest.  He says he has been walking on the treadmill for several miles and he does not desaturate.  He says his pulse ox is 93% at that time.  He never drops into the 80s.  He does not think his dizziness  and palpitation issues are related to his pulmonary fibrosis.  However [name] is wondering about oxygen drops during this time.  He is not tolerating his nintedanib at full dose without any problems.  He has had a successful [place] trip.  He is participating in the patient support group.  He is up-to-date with his flu shot      OV 10/13/2018  Subjective:  Patient ID: Tanner Bishop, male , DOB: 06/21/1946 , age 46 y.o. , MRN: [id number] , ADDRESS: [place] [id number]   10/13/2018 -   Chief Complaint  Patient presents with  . Follow-up    review PFT.  c/o baseline sob with exertion.       HPI Tanner Bishop 75 y.o. -presents for follow-up of IPF to the ILD clinic.  This visit is arranged because letter physiologist [name] was concerned about hypoxemia effects on his atrial fibrillation.  At this point in time patient tells me that after increasing dose of flecainide his atrial fibrillation and palpitations have improved and resolved.  Overall he feels stable.  On September 26, 2018 he had 6-minute walk test and this was pretty robust with walking 384 meters and with lowest pulse ox of 95%.  Then on October 05, 2018 he had overnight pulse oximetry that shows 12 minutes of desaturation less than 88%.  He is not interested in oxygen.  Then he called his October 07, 2018 with worsening bronchitis episodes and we gave him 5 days of prednisone and antibiotics.  He finished his last dose yesterday.  Overall he feels stable but pulmonary function test today shows decline in both FVC and DLCO.  And then when I questioned him he felt like he still had some residual bronchiti and feels another course of prednisone could help him.  There are no other new issues.  Last liver function test October 2019 and was normal.      OV 01/12/2019  Subjective:  Patient ID: Tanner Bishop, male , DOB: 1946-02-26 , age 57 y.o. , MRN: [id number] , ADDRESS: [place]  [id number]   01/12/2019 -   Chief Complaint  Patient presents with  . Follow-up    PFT performed today. Pt states he is about the same since last visit.States he still becomes SOB with exertion, has a lot of coughing but has been taking mucinex, and also has had some CP or chest tightness.     HPI Tanner Bishop 75 y.o. -returns for follow-up of his IPF.  He is now more than 5 years since diagnosis.,  April 2020 he will be 6 years since diagnosis.  He continues to exercise well on the treadmill and according to his wife he is does well.  He is not dropping oxygen at this time.  He does not have a cough when needed works on the tile.  However at other times the day especially when he is trying to lie down to bed or when he gets up in the morning and goes to the bathroom he has really bad cough.  Overall the cough severity is now 6 out of 10 in terms of how it is impacting his quality of life.  He tells me that every time he takes prednisone for the cough he feels better but then when he comes off prednisone cough gets worse.  He said multiple prednisone courses of brief duration for this chronic cough.  In addition for the last few weeks has had yellow sinus drainage and sinus headache and sinus congestion and he feels this is again making his chronic cough worse.  He is open to taking antibiotic and prednisone course.  In terms of taking nintedanib he has no side effects.  His last liver function test reviewed was in October 2019 and it was normal at that time.  Overall at this point in time he really wants good significant support for his cough in terms of affecting his quality of life.    ROS - per HPI     OV 02/16/2019  Subjective:  Patient ID: Tanner Bishop, male , DOB: 1946-11-25 , age 37 y.o. , MRN: [id number] , ADDRESS: [address] [place] [address]   02/16/2019 -   Chief Complaint  Patient presents with  . Follow-up    HRCT  and sinus CT performed 2/18. Pt states he is about the same  as last visit and states SOB is about the same. Pt still has a dry cough. Denies any complaints of CP/chest tightness.     HPI Tanner Bishop 75 y.o. -returns for follow-up of his IPF.  He presents with his wife.  He is here to review the test results.  He had high-resolution CT chest and CT sinus.  These were done in order to reevaluate his disease because he is having significant cough.  His high-resolution CT chest shows only mild progression in his IPF in 3 years.  However there is significant new findings of tracheobronchomalacia although there is no lung cancer.  He also has two-vessel coronary artery calcification.  These are new findings.  Since last visit and a sinus episode he is stable.  He is currently run out of his nasal steroids.  He says the pharmacy said that I declined his prescription request.  I do not recollect such a conversation.  In any event he is now going to participate in the cough study called scenic.  He has received a copy of the consent form.  He is rated.  He has an appointment for his consent visit.  It is possible that the nasal steroid is on the exclusion list but I do not have the exclusion list with me.  Anyway he is not consented.  He is not having chest pain when he does his treadmill exercises.  He works out hard.  He states he gets tachycardic.  He says he has a 6 times that he will die from his heart condition before his pulmonary fibrosis.  He has upcoming appointment with his cardiologist [name] for atrial fibrillation.  ...................................................................... OV 06/08/2019   Subjective:  Patient ID: Tanner Bishop, male , DOB: 23-Sep-1946 , age 26 y.o. , MRN: [id number] , ADDRESS: [place] [id number]   06/08/2019 -   Chief Complaint  Patient presents with  . Follow-up    1wk f/u for IPF. Patient stated that he was given a round of prednisone last week for some SOB but is feeling much better.       HPI Tanner Bishop 75 y.o. -returns for IPF follow-up.  Last seen in March 2020.  Since the pandemic started he has been social distancing.  He has not attended any support group.  He has been tolerating his Ofev quite well without any difficulty.  Recently had a bronchitis exacerbation and we gave prednisone.  This resolved his cough.  He was going to participate in a cough study but because of the pandemic the study got canceled.  He tells me that every time he takes prednisone the cough goes away.  He is very interested in taking daily prednisone for symptom relief of cough.  We discussed the side effects of prednisone that include weight gain, easy bruising, adrenal insufficiency, osteoporosis, cataracts hypertension, diabetes, immunosuppression.  Despite all this he wants to take a low-dose prednisone daily.  His last liver function test was in March 2020.  This needs to be retested because he is on nintedanib.  His symptom scores at walk test show relative stability.  His last pulmonary function test was in January 2020 but because of the pandemic is on hold.  We are using symptom and walk test to determine progression.         OV 04/11/2020  Subjective:  Patient ID: Tanner Bishop, male , DOB: 03/07/1946 ,  age 58 y.o. , MRN: [id number] , ADDRESS: [address] [place] [address]   04/11/2020 -   Chief Complaint  Patient presents with  . Follow-up    PFT performed today. Pt states he feels like his breathing is gradually becoming worse and states it could be at any time that he is SOB.   #IPF - uip path 03/22/13  - On OFEV since early May 2015 -On chronic daily prednisone because of cough since 2020  HPI Tanner Bishop 75 y.o. -presents with his wife IPF follow-up.  Last saw him June 2020.  After that he feels his dyspnea is worse.  He says that he could do several miles in one 1 hour and 30 minutes on the treadmill.  The same distance and now taking 1 hour and 40 minutes.  He  feels his disease is getting worse.  He says the prednisone is working well for him because of improved cough and wellbeing.  He is having some skin bruising because of that.  The nintedanib he is tolerating well except for some mild diarrhea.  On objective symptom score is actually similar to 1 year ago but may be slightly worse than 6 months ago or 9 months ago.  On pulmonary function testing he is stable compared to 1 year ago but slow and steadily he is definitely worse over the many years.  We noticed on his walking desaturation test that he did not mount a tachycardic response as he is done in the past.  He is on flecainide for atrial fibrillation.  He takes his 3 times daily.  He feels his atrial fibrillation is under good control.  There is no chest pain.  There is no weight loss.  Is no loss of physical conditioning.  He is interested in clinical trials.   A month ago he had nausea and his amylase/lipase was elevated.  He says he is fine now.  Primary care address this. ROS - per HPI  IMPRESSION: 1. 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 Findings are consistent with UIP per consensus guidelines: Diagnosis of Idiopathic Pulmonary Fibrosis: An Official ATS/ERS/JRS/ALAT Clinical Practice Guideline. [place], Iss 5, [phone], Aug 14 2017. 2. 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 3. Two-vessel coronary atherosclerosis.  Aortic Atherosclerosis (ICD10-I70.0).   Electronically Signed   By: Ilona Sorrel M.D.   On: 01/31/2019 12:24  OV 06/11/2020  Subjective:  Patient ID: Tanner Bishop, male , DOB: Mar 25, 1946 , age 38 y.o. , MRN: [id number] , ADDRESS: [place] [id number]   06/11/2020 -   Chief Complaint  Patient presents with  . Follow-up    PFT performed today.  Pt states he has been doing good since  last visit. States breathing is about the same.,    #IPF - uip path 03/22/13  - On OFEV since early May 2015 -On chronic daily prednisone because of cough since 2020  HPI Tanner Bishop 75 y.o. -returns to clinic for ILD follow-up.  In the interim he feels that his wife has worsening dementia.  She is able to self-care but only does minimal cooking.  She is more irritable.  His daughter Theadora Rama is now on disability because of back issues.  Patient has a son in [place] who he is close with.  Together they take care of his wife.  He says he has become  the primary caregiver now for his wife.  Nevertheless she is functional and does some ADLs.  He continues with nintedanib 150 mg twice daily.  Overall tolerance is good just mild diarrhea.  This is baseline.  Shortness of breath is baseline symptom score is 14 and similar to the past.  Pulmonary function test appears stable.  Walking desaturation test is stable.  His last liver function test was 2 months ago.  His main issue is that he is having some financial issues covering the cost of nintedanib.  He $6000 grant is running out.  He only has 1 month supply left.  He wants to meet with the pharmacist.      OV 11/12/2020   Subjective:  Patient ID: Tanner Bishop, male , DOB: Mar 16, 1946, age 65 y.o. years. , MRN: [id number],  ADDRESS: [place] [address] PCP  [name], MD Providers : Treatment Team:  Attending Provider: Brand Males, MD Patient Care Team: [name], MD as PCP - Irineo Axon, MD as Consulting Physician (Pulmonary Disease) [place], Anne Ng, [facility] as Pharmacist (Pharmacist)    Chief Complaint  Patient presents with  . Follow-up    IPF, still getting SOB on DOE with some chest tightness       HPI Tanner Bishop 75 y.o. -IPF follow-up.  Presents with his wife who I am seeing after a long time.  According to the patient patient himself is stable from IPF standpoint.  However he  says for the last several months he has had an exacerbation in his chronic back pain.  He has significant pain.  He has had steroid injections.  The first 1 was successful but the second 1 and subsequent failed.  He is not able to do his walks as he used to in the past.  He has gained some weight.  He is frustrated by this.  He is asking if he can go see a Restaurant manager, fast food.  The other issue with him was elevated amylase.  We never found a cause for this.  So his primary care physician worked him up he had abdominal CT imaging on 08/16/2020 and this was normal.  Otherwise he is okay  He is asking for a refill on his prednisone that he takes for cough.  He also wants Tussionex as needed.  He takes that for cough.  He also says that he got summoned for jury duty.  He is wondering what to do.  I told him under no circumstances he should ever do jury duty.  I worry about the risk   ROS - per HPI  OV 01/24/2021  Subjective:  Patient ID: Tanner Bishop, male , DOB: 1946/05/18 , age 57 y.o. , MRN: [id number] , ADDRESS: [address] [place] [address] PCP [name], MD Patient Care Team: [name], MD as PCP - General Brand Males, MD as Consulting Physician (Pulmonary Disease) [place], Anne Ng, [facility] as Pharmacist (Pharmacist)  This Provider for this visit: Treatment Team:  Attending Provider: Brand Males, MD    01/24/2021 -   Chief Complaint  Patient presents with  . Acute Visit    Increased SOB since last Friday, heart still feels "funny"   Follow-up idiopathic pulmonary fibrosis On nintedanib and low-dose prednisone for cough Idiopathic elevation in amylase unrelated to nintedanib.  Normal abdominal imaging.  HPI Tanner Bishop 75 y.o. -returns for an acute visit.  On Friday, 01/22/2021 he says he got up and he felt like baseline and  then when he walked he suddenly noticed that he was suddenly way more short of breath than usual.  His heart started feeling funny.  He took  an acute visit with cardiology.  I reviewed the notes.  I do not see an EKG but he tells me an EKG was done and he was in sinus rhythm.  He had normal troponin, normal BNP and normal D-dimer.  Chest x-ray was baseline.  I personally visualized this.  Therefore he was sent to pulmonary.  We are seeing him as an acute visit today.  His symptom scores are definitely worse than baseline.  He is tolerating his nintedanib and his weight is stable.  His walking desaturation test shows a sudden decline.  For the first time his pulse ox is below 88%.  His pace of walking was much slower.  He tells me he still feels funny in his chest.  There are no Covid symptoms.  He is fully vaccinated.  There is no edema orthopnea proximal nocturnal dyspnea coughing or wheezing.  There is no sputum or fever production.  His wife is here with him.Marland Kitchen    Results for Tanner, Bishop (MRN [id number]) as of 04/11/2020 12:14  Ref. Range 05/14/2014 15:46 07/24/2015 12:43 08/26/2015 10:52 11/02/2016 09:51 05/19/2017 08:41 09/16/2017 08:47 03/16/2018 08:48 05/16/2018 10:53 10/13/2018 09:52 01/12/2019 09:49 04/11/2020 10:56 06/11/2020   FVC-Pre Latest Units: L 2.71 2.69 2.73 2.61 2.51 2.53 2.29 2.38 2.25 2.21 2.23 2.26  FVC-%Pred-Pre Latest Units: % _0 56 57%  Results for Tanner Bishop, Tanner Bishop (MRN [id number]) as of 04/11/2020 12:14  Ref. Range 05/14/2014 15:46 07/24/2015 12:43 08/26/2015 10:52 11/02/2016 09:51 05/19/2017 08:41 09/16/2017 08:47 03/16/2018 08:48 05/16/2018 10:53 10/13/2018 09:52 01/12/2019 09:49 04/11/2020 10:56 06/11/2020   DLCO unc Latest Units: ml/min/mmHg 20.24 15.36 15.36 17.23 15.83  13.10 16.48 14.17 14.38 14.72 13.69  DLCO unc % pred Latest Units: % 71 54 54 60 55  46 58 50 50 62 58%        CT Chest data  DG Chest 2 View  Result Date: 01/23/2021 CLINICAL DATA:  Shortness of breath cardiac palpitations EXAM: CHEST - 2 VIEW COMPARISON:  12/01/2016 FINDINGS: Cardiac shadow is at the upper limits of normal in size but  stable. Lungs are well aerated bilaterally with mild fibrotic scarring stable in appearance from the prior exam. No new focal infiltrate or sizable effusion is seen. No acute bony abnormality is noted. IMPRESSION: Chronic scarring without acute abnormality. Electronically Signed   By: Inez Catalina M.D.   On: 01/23/2021 08:24   CT Chest High Resolution  Result Date: 01/24/2021 CLINICAL DATA:  IPF EXAM: CT CHEST WITHOUT CONTRAST TECHNIQUE: Multidetector CT imaging of the chest was performed following the standard protocol without intravenous contrast. High resolution imaging of the lungs, as well as inspiratory and expiratory imaging, was performed. COMPARISON:  01/31/2019, 03/11/2016, 07/24/2015, 02/14/2013 FINDINGS: Cardiovascular: Aortic atherosclerosis. Cardiomegaly. Scattered left coronary artery calcifications. Enlargement of the main pulmonary artery measuring up to 3.4 cm in caliber. No pericardial effusion. Mediastinum/Nodes: No enlarged mediastinal, hilar, or axillary lymph nodes. Thyroid gland, trachea, and esophagus demonstrate no significant findings. Lungs/Pleura: Redemonstrated moderate pulmonary fibrosis in a pattern with apical to basal gradient, featuring traction bronchiectasis, irregular peripheral interstitial opacity, septal thickening, subpleural bronchiolectasis, and areas of honeycombing at the lung bases. Fibrotic findings are minimally worsened compared to prior examination dated 01/31/2019 and further slightly worsened over a longer period of follow-up dating back to  02/14/2013. No significant air trapping on expiratory phase imaging. Evidence of prior left lung wedge biopsy. No pleural effusion or pneumothorax. Upper Abdomen: No acute abnormality. Musculoskeletal: No chest wall mass or suspicious bone lesions identified. IMPRESSION: 1. Redemonstrated moderate pulmonary fibrosis in a pattern with apical to basal gradient, featuring traction bronchiectasis, irregular peripheral  interstitial opacity, septal thickening, subpleural bronchiolectasis, and areas of honeycombing at the lung bases. Fibrotic findings are minimally worsened compared to prior examination dated 01/31/2019 and further slightly worsened over a longer period of follow-up dating back to 02/14/2013. Findings are in a UIP pattern and in keeping with reported diagnosis of IPF. 2. Cardiomegaly and coronary artery disease. 3. Enlargement of the main pulmonary artery, as can be seen in pulmonary hypertension. Aortic Atherosclerosis (ICD10-I70.0). Electronically Signed   By: Eddie Candle M.D.   On: 01/24/2021 09:16      PFT  PFT Results Latest Ref Rng & Units 06/11/2020 04/11/2020 01/12/2019 10/13/2018 05/16/2018 03/16/2018 09/16/2017  FVC-Pre L 2.26 2.23 2.21 2.25 2.38 2.29 2.53  FVC-Predicted Pre % 57 56 56 57 60 58 64  FVC-Post L - - 2.25 - - - -  FVC-Predicted Post % - - 57 - - - -  Pre FEV1/FVC % % 88 89 87 86 88 88 88  Post FEV1/FCV % % - - 90 - - - -  FEV1-Pre L 2.00 1.99 1.93 1.93 2.09 2.02 2.23  FEV1-Predicted Pre % 70 69 67 67 73 70 77  FEV1-Post L - - 2.03 - - - -  DLCO uncorrected ml/min/mmHg 13.69 14.72 14.38 14.17 16.48 13.10 -  DLCO UNC% % 58 62 50 50 58 46 -  DLCO corrected ml/min/mmHg 13.69 14.76 - - - 13.14 -  DLCO COR %Predicted % 58 62 - - - 46 -  DLVA Predicted % 96 110 102 94 110 94 -  TLC L - - - - - - -  TLC % Predicted % - - - - - - -  RV % Predicted % - - - - - - -       OV 03/11/2021  Subjective:  Patient ID: Tanner Bishop, male , DOB: 01-26-1946 , age 27 y.o. , MRN: [id number] , ADDRESS: [place] [id number] PCP [name], MD Patient Care Team: [name], MD as PCP - General Brand Males, MD as Consulting Physician (Pulmonary Disease) Viona Gilmore, [facility] as Pharmacist (Pharmacist)  This Provider for this visit: Treatment Team:  Attending Provider: Brand Males, MD    03/11/2021 -   Chief Complaint  Patient presents with  .  Follow-up    Follow up after PFT.  Pt stated that he has the oxygen but these are the larger tanks.  He is waiting to get the POC from [facility].    Follow-up idiopathic pulmonary fibrosis  - On nintedanib and low-dose prednisone for cough  -Start portable oxygen for progressive disease March 2022  Idiopathic elevation in amylase unrelated to nintedanib.  Normal abdominal imaging.   Normal right heart catheterization 02/21/2021 by Dr. Glori Bickers.    - Mean pressure 17.  Wedge pressure 6.  Cardiac index 2.6.  PVR 2.1  HPI Tanner Bishop 75 y.o. -presents for follow-up since his last visit when we noticed exertional hypoxemia.  Got echocardiogram and then sent him to cardiology.  He had right heart catheterization.  The result is reviewed and is normal.  He is tolerating his nintedanib fine.  He has had  problems qualifying for oxygen even though he desaturated.  He says something about insurance did not approve my order.  He is also frustrated with [facility].  He wants to switch to [place].  He is interested in clinical trial but current phase 3 trials are on hold because of logistic issues with sponsor STARSCAPE study. Other studies might be available       SYMPTOM SCALE - ILD 02/16/2019  06/08/2019  04/11/2020  06/11/2020  11/12/2020 169# weight 01/24/2021 171# 03/11/2021 175#  O2 use _0  ra ra  2Shortness of Breath 0 -> 5 scale with 5 being worst (score 6 If unable to do)        2At rest 1 1 0 _1 Simple tasks - showers, clothes change, eating, shaving _2 Household (dishes, doing bed, laundry) _3 Shopping _4 Walking level at own pace _5 Walking up Stairs _6 Total (40 - 48) Dyspnea Score _7 How bad is your cough? 3 Xx - due to recent pred _8 How bad is your fatigue 3 x _9 nausea   0 0 0 0 0  vomit   0 0 0 0 0  diarrhea   _10 anxiety   0 0 0 0 0  deopresso    0 0 0 0 0       Simple office walk 185 feet x  3 laps goal with forehead probe 09/19/2018  01/12/2019  02/16/2019  06/08/2019  04/11/2020  06/11/2020  11/12/2020  01/24/2021  03/11/2021   O2 used Room air Room air Room air Room air  ra ra ra ra  Number laps completed _11 Comments about pace normal normal normal normal  Mod pace avg [ace Slow pace   Resting Pulse Ox/HR 100% and 73/min 100% ad 74/min 100% and 68/min 98% and 82/min 99% and 60/min 98% and 54 98% and 73/min 94% and 88/min 97% RA at rest  Final Pulse Ox/HR 93% and 85/min 92% and 84/min 93% and 81/min 91% and 96/mni 91% and 74/mm 90% and 71/min 90% and 86/min 87% and 95/min desats to 87% on 2nd lap  Desaturated </= 88% no no no no yes yes yes yes   Desaturated <= 3% points Yes, 7 poin Yes, 8 ponts Yes, 7 points Yes, 7 points Yes, 8point Yes, 8 point Yes, 8 points Yes, 7 points   Got Tachycardic >/= 90/min no no no yes       Symptoms at end of test x none none none dyspnea none None dyspnea dyspnea   Miscellaneous comments x x stable stable but tachy for first time     Corrected with 3L [place] - just 1 lap. Not all 3 laps tested on 3L [place]       PFT  PFT Results Latest Ref Rng & Units 03/11/2021 06/11/2020 04/11/2020 01/12/2019 10/13/2018 05/16/2018 03/16/2018  FVC-Pre L 2.10 2.26 2.23 2.21 2.25 2.38 2.29  FVC-Predicted Pre % 53 57 56 56 57 60 58  FVC-Post L - - - 2.25 - - -  FVC-Predicted Post % - - - 57 - - -  Pre FEV1/FVC % % 86 88 89 87 86 88 88  Post FEV1/FCV % % - - - 90 - - -  FEV1-Pre L 1.81 2.00 1.99 1.93 1.93 2.09 2.02  FEV1-Predicted Pre % 64 70 69 67 67 73 70  FEV1-Post L - - - 2.03 - - -  DLCO uncorrected ml/min/mmHg 11.41 13.69 14.72 14.38 14.17 16.48 13.10  DLCO UNC% % 48 58 62 50 50 58 46  DLCO corrected ml/min/mmHg 11.51 13.69 14.76 - - - 13.14  DLCO COR %Predicted % 48 58 62 - - - 46  DLVA Predicted % 89 96 110 102 94 110 94  TLC L - - - - - - -  TLC % Predicted % - - - - - - -  RV % Predicted % -  - - - - - -       has a past medical history of Allergy, Arthritis, Atrial flutter (HCC), Cataract, GERD (gastroesophageal reflux disease), H/O hiatal hernia, Hyperlipidemia, Injury of right hand, Pulmonary fibrosis ([place]), Renal stone (06/2014), and Ulcer.   reports that he quit smoking about 42 years ago. His smoking use included cigarettes. He has a 16.00 pack-year smoking history. He has never used smokeless tobacco.  Past Surgical History:  Procedure Laterality Date  . ATRIAL FLUTTER ABLATION N/A 04/08/2015   Procedure: ATRIAL FLUTTER ABLATION;  Surgeon: Evans Lance, MD;  Location: [facility] CATH LAB;  Service: Cardiovascular;  Laterality: N/A;  . CATARACT EXTRACTION, BILATERAL  2016  . COLONOSCOPY  03/01/2017   per Dr. Loletha Carrow, clear, repeat in 5 yrs (brother had colon cancer)   . ESOPHAGOGASTRODUODENOSCOPY  2007  . EXTRACORPOREAL SHOCK WAVE LITHOTRIPSY  06-17-14   per Dr. Jeffie Pollock   . HAND SURGERY Right   . LUNG BIOPSY Left 03/22/2013   Procedure: LUNG BIOPSY;  Surgeon: Melrose Nakayama, MD;  Location: [facility];  Service: Thoracic;  Laterality: Left;  . POLYPECTOMY    . RIGHT HEART CATH N/A 02/21/2021   Procedure: RIGHT HEART CATH;  Surgeon: Jolaine Artist, MD;  Location: [facility] CV LAB;  Service: Cardiovascular;  Laterality: N/A;  . UPPER GASTROINTESTINAL ENDOSCOPY    . VIDEO ASSISTED THORACOSCOPY Left 03/22/2013   Procedure: VIDEO ASSISTED THORACOSCOPY;  Surgeon: Melrose Nakayama, MD;  Location: [facility];  Service: Thoracic;  Laterality: Left;    No Known Allergies  Immunization History  Administered Date(s) Administered  . Fluad Quad(high Dose 65+) 08/25/2019  . Influenza Split 09/13/2013  . Influenza, High Dose Seasonal PF 10/06/2016, 09/16/2017, 08/17/2018  . Influenza-Unspecified 09/07/2014, 09/14/2015, 09/13/2020  . PFIZER(Purple Top)SARS-COV-2 Vaccination 02/12/2020, 03/12/2020, 08/14/2020  . Pneumococcal Conjugate-13 02/16/2017  . Pneumococcal Polysaccharide-23  05/26/2013  . Td 12/15/2007  . Tdap 02/23/2018  . Zoster 02/19/2014  . Zoster Recombinat (Shingrix) 10/13/2018, 07/25/2019    Family History  Problem Relation Age of Onset  . Liver cancer Mother   . Diabetes Sister   . Lung cancer Sister   . Heart disease Brother   . Diabetes Brother   . Colon cancer Brother   . Diabetes Other   . Cancer Other        prostate  . Colon polyps Neg Hx   . Esophageal cancer Neg Hx   . Rectal cancer Neg Hx   . Stomach cancer Neg Hx      Current Outpatient Medications:  .  chlorpheniramine-HYDROcodone (TUSSIONEX PENNKINETIC ER) 10-8 MG/5ML SUER, Take 5 mLs by mouth 2 (two) times daily as needed for cough (  Do not take with other sedating medication)., Disp: 140 mL, Rfl: 0 .  diphenhydrAMINE (BENADRYL) 25 MG tablet, Take 25 mg by mouth at bedtime as needed for allergies or sleep., Disp: , Rfl:  .  flecainide (TAMBOCOR) 50 MG tablet, Take 1 tablet (50 mg total) by mouth 3 (three) times daily., Disp: 270 tablet, Rfl: 3 .  fluticasone (FLONASE) 50 MCG/ACT nasal spray, SPRAY 2 SPRAYS INTO EACH NOSTRIL EVERY DAY (Patient taking differently: Place 2 sprays into both nostrils daily as needed for allergies.), Disp: 48 mL, Rfl: 5 .  OFEV 150 MG CAPS, TAKE 1 CAPSULE BY MOUTH TWICE DAILY WITH FOOD. (Patient taking differently: Take 150 mg by mouth 2 (two) times daily.), Disp: 60 capsule, Rfl: 10 .  pantoprazole (PROTONIX) 40 MG tablet, Take 1 tablet (40 mg total) by mouth 2 (two) times daily., Disp: 180 tablet, Rfl: 3 .  predniSONE (STERAPRED UNI-PAK 21 TAB) 10 MG (21) TBPK tablet, Take by mouth daily. Take 56m for 3 days, 366mfor 3 days, 2080mor 3 days, 34m30mr the days then back to normal dosing, Disp: 30 tablet, Rfl: 0 .  zolpidem (AMBIEN) 5 MG tablet, TAKE 1 TABLET BY MOUTH EVERY DAY AT BEDTIME AS NEEDED FOR SLEEP (Patient taking differently: Take 5 mg by mouth at bedtime as needed for sleep.), Disp: 30 tablet, Rfl: 5      Objective:   Vitals:   03/11/21  0955  BP: 124/70  Pulse: (!) 56  Temp: (!) 97.4 F (36.3 C)  TempSrc: Tympanic  SpO2: 97%  Weight: 175 lb 6 oz (79.5 kg)  Height: 5' 7.5" (1.715 m)    Estimated body mass index is 27.06 kg/m as calculated from the following:   Height as of this encounter: 5' 7.5" (1.715 m).   Weight as of this encounter: 175 lb 6 oz (79.5 kg).  [facility]   03/11/21 0955  Weight: 175 lb 6 oz (79.5 kg)     Physical Exam  General: No distress. Looks well Neuro: Alert and Oriented x 3. GCS 15. Speech normal Psych: Pleasant Resp:  Barrel Chest - no.  Wheeze - no, Crackles - yes at base, No overt respiratory distress CVS: Normal heart sounds. Murmurs - no Ext: Stigmata of Connective Tissue Disease - no HEENT: Normal upper airway. PEERL +. No post nasal drip        Assessment:       ICD-10-CM   1. IPF (idiopathic pulmonary fibrosis) (HCC)[place]84.112   2. Exercise hypoxemia  R09.02        Plan:     Patient Instructions     ICD-10-CM   1. IPF (idiopathic pulmonary fibrosis) (HCC)[place]84.112   2. Exercise hypoxemia  R09.02     IPF is definitely progressive  -  Now desaturating with exertion  -Prednisone burst did not make a difference  -Lung function study showed decline  -Right heart catheterization March 2022 is normal   Plan -  -Start portable oxygen 3 L nasal cannula with exertion -Retest overnight pulse oximetry study when he is sleeping  -Not sure what happened to the previous test done greater than 1 month ago -Continue nintedanib -I think you will have to consider clinical trials as a care option in the next few months  -We will revisit this in the summer 2022  - continue palliative measures for cough (tussionex and low dose prednisone)  Follow-up -8-12 weeks to spirometry and DLCO -Return to see  [name] in 8-12 weeks or sooner if needed  -74 min slot     SIGNATURE    Dr. Brand Males, M.D., F.C.C.P,  Pulmonary and Critical Care  Medicine Staff Physician, [place] Director - Interstitial Lung Disease  Program  Pulmonary [place] at [place], [place], [address]  Pager: [phone], If no answer or between  15:00h - 7:00h: call [phone] Telephone: [phone]  10:28 AM 03/11/2021

## 2021-03-12 ENCOUNTER — Telehealth: Payer: Self-pay | Admitting: Internal Medicine

## 2021-03-12 NOTE — Telephone Encounter (Signed)
I spoke with the pt  He is wanting o2 order with [facility] to be cancelled and he has decided he wants to use [facility]  This is due to the location  PCC's- can you cancel referral with [facility] and please send to [facility]? His qualifying numbers were done at [facility] 03/11/21

## 2021-03-12 NOTE — Telephone Encounter (Signed)
Called and spoke with patient, he states he does not wear oxygen at night.  The oxygen was ordered yesterday for him to use when he exerts himself.  He states he wants the orders to go to [place].  Advised him I would look and see where the orders went and advise he wants them to go to [place].  Order placed for 3L of oxygen with POC with exertion per MR's f/u note.  ONO corrected to reflect to be done on RA.  Nothing further needed.

## 2021-03-12 NOTE — Telephone Encounter (Signed)
New order was placed.  Nothing further needed.

## 2021-03-12 NOTE — Addendum Note (Signed)
Addended by: Vanessa Barbara on: 03/12/2021 12:38 PM   Modules accepted: Orders

## 2021-03-12 NOTE — Telephone Encounter (Signed)
Please see below regarding ONO order.  Boyd Kerbs, Leroy Sea, [place]; Samples, Rise Paganini   Thanks for the order please amend the order to add the directions on how to test. Thanks

## 2021-03-12 NOTE — Telephone Encounter (Addendum)
Order for poc was put in on 2/16.  I called [facility] & verified with Tiffany order, ov & walk all have to be within 30 days.  OV & walk was done yesterday so that's good but will need new order to be placed for O2 to go to [place].  Routing back to triage to have new order placed.

## 2021-03-21 ENCOUNTER — Other Ambulatory Visit: Payer: Self-pay | Admitting: Family Medicine

## 2021-03-21 ENCOUNTER — Other Ambulatory Visit: Payer: Self-pay | Admitting: Internal Medicine

## 2021-03-24 DIAGNOSIS — J84112 Idiopathic pulmonary fibrosis: Secondary | ICD-10-CM | POA: Diagnosis not present

## 2021-03-25 ENCOUNTER — Telehealth: Payer: Self-pay | Admitting: Internal Medicine

## 2021-03-25 NOTE — Telephone Encounter (Signed)
Called [facility] and spoke with Estill Bamberg  She states request was denied at [place] but then [facility] has the order approved now  They are going to be reaching out to pt to talk about delivery and should be able to deliver to him tomorrow  I spoke with the pt and notified him and he verbalized understanding, nothing further needed

## 2021-03-26 DIAGNOSIS — J8410 Pulmonary fibrosis, unspecified: Secondary | ICD-10-CM | POA: Diagnosis not present

## 2021-03-28 DIAGNOSIS — J84112 Idiopathic pulmonary fibrosis: Secondary | ICD-10-CM | POA: Diagnosis not present

## 2021-04-03 ENCOUNTER — Ambulatory Visit (INDEPENDENT_AMBULATORY_CARE_PROVIDER_SITE_OTHER): Payer: PPO | Admitting: Family Medicine

## 2021-04-03 ENCOUNTER — Other Ambulatory Visit: Payer: Self-pay

## 2021-04-03 ENCOUNTER — Encounter: Payer: Self-pay | Admitting: Family Medicine

## 2021-04-03 ENCOUNTER — Telehealth: Payer: Self-pay | Admitting: Family Medicine

## 2021-04-03 VITALS — BP 110/80 | HR 62 | Temp 97.7°F | Ht 67.0 in | Wt 170.0 lb

## 2021-04-03 DIAGNOSIS — Z0000 Encounter for general adult medical examination without abnormal findings: Secondary | ICD-10-CM

## 2021-04-03 LAB — BASIC METABOLIC PANEL
BUN: 16 mg/dL (ref 6–23)
CO2: 32 mEq/L (ref 19–32)
Calcium: 9.9 mg/dL (ref 8.4–10.5)
Chloride: 101 mEq/L (ref 96–112)
Creatinine, Ser: 1.21 mg/dL (ref 0.40–1.50)
GFR: 59.26 mL/min — ABNORMAL LOW (ref >60.00)
Glucose, Bld: 87 mg/dL (ref 70–99)
Potassium: 4.6 mEq/L (ref 3.5–5.1)
Sodium: 141 mEq/L (ref 135–145)

## 2021-04-03 LAB — CBC WITH DIFFERENTIAL/PLATELET
Basophils Absolute: 0 10*3/uL (ref 0.0–0.1)
Basophils Relative: 0.5 % (ref 0.0–3.0)
Eosinophils Absolute: 0.2 10*3/uL (ref 0.0–0.7)
Eosinophils Relative: 2.5 % (ref 0.0–5.0)
HCT: 45.6 % (ref 39.0–52.0)
Hemoglobin: 15.1 g/dL (ref 13.0–17.0)
Lymphocytes Relative: 30 % (ref 12.0–46.0)
Lymphs Abs: 2.9 10*3/uL (ref 0.7–4.0)
MCHC: 33.2 g/dL (ref 30.0–36.0)
MCV: 96.9 fl (ref 78.0–100.0)
Monocytes Absolute: 0.9 10*3/uL (ref 0.1–1.0)
Monocytes Relative: 9.6 % (ref 3.0–12.0)
Neutro Abs: 5.6 10*3/uL (ref 1.4–7.7)
Neutrophils Relative %: 57.4 % (ref 43.0–77.0)
Platelets: 314 10*3/uL (ref 150.0–400.0)
RBC: 4.7 Mil/uL (ref 4.22–5.81)
RDW: 15.6 % — ABNORMAL HIGH (ref 11.5–15.5)
WBC: 9.7 10*3/uL (ref 4.0–10.5)

## 2021-04-03 LAB — HEPATIC FUNCTION PANEL
ALT: 15 U/L (ref 0–53)
AST: 19 U/L (ref 0–37)
Albumin: 4 g/dL (ref 3.5–5.2)
Alkaline Phosphatase: 45 U/L (ref 39–117)
Bilirubin, Direct: 0.1 mg/dL (ref 0.0–0.3)
Total Bilirubin: 0.6 mg/dL (ref 0.2–1.2)
Total Protein: 7.2 g/dL (ref 6.0–8.3)

## 2021-04-03 LAB — LIPID PANEL
Cholesterol: 225 mg/dL — ABNORMAL HIGH (ref 0–200)
HDL: 69.7 mg/dL (ref >39.00)
LDL Cholesterol: 137 mg/dL — ABNORMAL HIGH (ref 0–99)
NonHDL: 154.98
Total CHOL/HDL Ratio: 3
Triglycerides: 90 mg/dL (ref 0.0–149.0)
VLDL: 18 mg/dL (ref 0.0–40.0)

## 2021-04-03 LAB — HEMOGLOBIN A1C: Hgb A1c MFr Bld: 6.4 % (ref 4.6–6.5)

## 2021-04-03 LAB — PSA: PSA: 1.6 ng/mL (ref 0.10–4.00)

## 2021-04-03 LAB — TSH: TSH: 2.68 u[IU]/mL (ref 0.35–4.50)

## 2021-04-03 NOTE — Telephone Encounter (Signed)
Patient is calling back for lab results, please advise. CB is [phone]

## 2021-04-03 NOTE — Progress Notes (Signed)
Subjective:    Patient ID: Tyrelle Raczka, male    DOB: [birth year], 75 y.o.   MRN: [id number]  HPI Here for a well exam. He is doing fairly well. His pulmonary fibrosis is progressing, albeit slowly. [name] recently provided him with home oxygen to use as needed. Fate still walks every day, but he has cut his mileage down to 3 miles a day. He has received 3 epidural steroid injections per Dr. Brien Few for his sciatic, but these have not been very helpful.    Review of Systems  Constitutional: Negative.   HENT: Negative.   Eyes: Negative.   Respiratory: Positive for shortness of breath.   Cardiovascular: Negative.   Gastrointestinal: Negative.   Genitourinary: Negative.   Musculoskeletal: Positive for back pain.  Skin: Negative.   Neurological: Negative.   Psychiatric/Behavioral: Negative.        Objective:   Physical Exam Constitutional:      General: He is not in acute distress.    Appearance: Normal appearance. He is well-developed. He is not diaphoretic.  HENT:     Head: Normocephalic and atraumatic.     Right Ear: External ear normal.     Left Ear: External ear normal.     Nose: Nose normal.     Mouth/Throat:     Pharynx: No oropharyngeal exudate.  Eyes:     General: No scleral icterus.       Right eye: No discharge.        Left eye: No discharge.     Conjunctiva/sclera: Conjunctivae normal.     Pupils: Pupils are equal, round, and reactive to light.  Neck:     Thyroid: No thyromegaly.     Vascular: No JVD.     Trachea: No tracheal deviation.  Cardiovascular:     Rate and Rhythm: Normal rate and regular rhythm.     Heart sounds: Normal heart sounds. No murmur heard. No friction rub. No gallop.   Pulmonary:     Effort: Pulmonary effort is normal. No respiratory distress.     Breath sounds: No wheezing or rales.     Comments: Diffuse dry crackles  Chest:     Chest wall: No tenderness.  Abdominal:     General: Bowel sounds are normal. There is no  distension.     Palpations: Abdomen is soft. There is no mass.     Tenderness: There is no abdominal tenderness. There is no guarding or rebound.  Genitourinary:    Penis: Normal. No tenderness.      Testes: Normal.     Prostate: Normal.     Rectum: Normal. Guaiac result negative.  Musculoskeletal:        General: No tenderness. Normal range of motion.     Cervical back: Neck supple.  Lymphadenopathy:     Cervical: No cervical adenopathy.  Skin:    General: Skin is warm and dry.     Coloration: Skin is not pale.     Findings: No erythema or rash.  Neurological:     Mental Status: He is alert and oriented to person, place, and time.     Cranial Nerves: No cranial nerve deficit.     Motor: No abnormal muscle tone.     Coordination: Coordination normal.     Deep Tendon Reflexes: Reflexes are normal and symmetric. Reflexes normal.  Psychiatric:        Behavior: Behavior normal.        Thought Content: Thought content normal.  Judgment: Judgment normal.           Assessment & Plan:  Well exam. We discussed diet and exercise. Get fasting labs.  Alysia Penna, MD

## 2021-04-04 ENCOUNTER — Telehealth: Payer: Self-pay | Admitting: Internal Medicine

## 2021-04-04 NOTE — Telephone Encounter (Signed)
See results note. 

## 2021-04-04 NOTE — Telephone Encounter (Signed)
Overnight oxygen done on March 26, 2021 on room air shows time spent on pulse ox less than equal to 88% at 16.9 minutes.  Lowest pulse ox was 82%.  Consecutive time less than or equal to 88% was 1.5 minutes.  Awake pulse ox was 94%.  Low pulse was 48 bpm.  Percent time in bradycardia was 35.8%.  Time spent less than equal to 89% was 38.5 minutes  Plan - He can start 2 L nasal cannula oxygen [he technically qualifies but if he wants to hold off for the moment that is fine too]

## 2021-04-07 NOTE — Telephone Encounter (Signed)
Attempted to call pt but unable to reach. Left message for him to return call.

## 2021-04-07 NOTE — Telephone Encounter (Signed)
Called and spoke with pt and he is aware of results of the overnight oxygen test.  He is going to hold off for now on the oxygen.  He will call back if he decided to take the oxygen and use it at night.

## 2021-04-11 ENCOUNTER — Encounter: Payer: PPO | Admitting: *Deleted

## 2021-04-11 ENCOUNTER — Encounter (INDEPENDENT_AMBULATORY_CARE_PROVIDER_SITE_OTHER): Payer: PPO | Admitting: Internal Medicine

## 2021-04-11 DIAGNOSIS — Z006 Encounter for examination for normal comparison and control in clinical research program: Secondary | ICD-10-CM

## 2021-04-11 DIAGNOSIS — J84112 Idiopathic pulmonary fibrosis: Secondary | ICD-10-CM

## 2021-04-11 NOTE — Progress Notes (Signed)
Title:  A Randomized double-blind placebo-controlled dose escalation and verification clinical study to assess the safety and efficacy of pulsed, inhaled nitric oxide (iNO) in subjects at risk of pulmonary hypertension associated with pulmonary fibrosis on long term oxygen therapy (Part 1 and Part 2). Primary Endpoint: The placebo-corrected change for INOpulse in minutes of moderate to vigorous physical activity (MVPA) measured by actigraphy from baseline to month 4. Duration of treatment: Study participants will receive iNO 45 mcg/kg IBW/hr versus placebo for 4 months (16 weeks) during Part 1.  Part 2: Open Label Extension (OLE) Study participants will be offered open label therapy at iNO 45 mcg/kg IBW/hr after completing the Part 1. Protocol #: PULSE-PHPF-001 (REBUILD), ClinicalTrials.gov Identifier: [id number], **Sponsor is [employer], [place], [place] [address])  xxxxxxxxxxxxxxxxxxxx  This visit for Subject Tanner Bishop with DOB: [birth year] on 04/11/2021 for the above protocol is Visit/Encounter #consenting and is for purpose of research. Subject/LAR expressed continued interest and consent in continuing as a study subject. Subject thanked for participation in research and contribution to science.   In this consent visit.  Patient consented in detail.  Study procedures done only after consent.  Exam was done after consent   Proceed per protocol    SIGNATURE    Dr. Brand Males, M.D., F.C.C.P, ACRP-CPI Pulmonary and Critical Care Medicine Research Investigator, [facility] [place] Staff Physician, [employer] Director - Interstitial Lung Disease  Program  [facility]ulmonary [facility] [place], [place], [address]   Pager: [phone], If no answer  OR between  19:00-7:00h: page [phone] Telephone (research): [phone]  9:51 PM 04/11/2021   9:51 PM 04/11/2021

## 2021-04-11 NOTE — Research (Signed)
Title:  A Randomized double-blind placebo-controlled dose escalation and verification clinical study to assess the safety and efficacy of pulsed, inhaled nitric oxide (iNO) in subjects at risk of pulmonary hypertension associated with pulmonary fibrosis on long term oxygen therapy (Part 1 and Part 2). Primary Endpoint: The placebo-corrected change for INOpulse in minutes of moderate to vigorous physical activity (MVPA) measured by actigraphy from baseline to month 4. Duration of treatment: Study participants will receive iNO 45 mcg/kg IBW/hr versus placebo for 4 months (16 weeks) during Part 1.  Part 2: Open Label Extension (OLE) Study participants will be offered open label therapy at iNO 45 mcg/kg IBW/hr after completing the Part 1. Protocol #: PULSE-PHPF-001 (REBUILD), ClinicalTrials.gov Identifier: [id number], **Sponsor is [employer], [place], [place] [address]) Protocol Version Amendment 7 dated 20_12_2021 Investigator Brochure Version for  version 11.0 approved 29 Jul 2020  Clinical Research Coordinator / Research RN note : This visit for Subject Tanner Bishop with DOB: Jul 28, 1946 on 04/11/2021 for the above protocol is Visit/Encounter #1 Screening and is for purpose of research.   The consent for this encounter is under Protocol Version Amendment 7 dated 20_12_2021, Investigator Brochure Version 11.0 approved 29 Jul 2020 is currently IRB approved.   Subject expressed continued interest and consent in continuing as a study subject. Subject confirmed that there was no change in contact information (e.g. address, telephone, email). Subject thanked for participation in research and contribution to science.  In this visit 04/11/2021 the subject was evaluated by investigator named Dr. Cheron Every. This research coordinator has verified that the investigator is uptodate with his training logs  Subject came in 04/11/2021 for Screening Visit 1 [place] REBUILD study. PI Dr.  Chase Caller completed the consent process with CRC Lazaro Arms and the subject. PI [name] also completed the physical exam.  All Screening Visit 1 procedures were completed per protocol.  For complete details, please see the subject binder for details.  Signed by  Lazaro Arms Clinical Research Coordinator [facility]  [place], [place] 11:59 AM 04/11/2021

## 2021-04-11 NOTE — Patient Instructions (Signed)
ICD-10-CM   1. Research subject  Z00.6   2. IPF (idiopathic pulmonary fibrosis) ([place])  C12.751

## 2021-04-22 ENCOUNTER — Telehealth: Payer: Self-pay | Admitting: Internal Medicine

## 2021-04-22 ENCOUNTER — Encounter: Payer: PPO | Admitting: *Deleted

## 2021-04-22 DIAGNOSIS — Z006 Encounter for examination for normal comparison and control in clinical research program: Secondary | ICD-10-CM

## 2021-04-22 DIAGNOSIS — J8410 Pulmonary fibrosis, unspecified: Secondary | ICD-10-CM

## 2021-04-22 NOTE — Research (Signed)
Title:  A Randomized double-blind placebo-controlled dose escalation and verification clinical study to assess the safety and efficacy of pulsed, inhaled nitric oxide (iNO) in subjects at risk of pulmonary hypertension associated with pulmonary fibrosis on long term oxygen therapy (Part 1 and Part 2). Primary Endpoint: The placebo-corrected change for INOpulse in minutes of moderate to vigorous physical activity (MVPA) measured by actigraphy from baseline to month 4. Duration of treatment: Study participants will receive iNO 45 mcg/kg IBW/hr versus placebo for 4 months (16 weeks) during Part 1.  Part 2: Open Label Extension (OLE) Study participants will be offered open label therapy at iNO 45 mcg/kg IBW/hr after completing the Part 1. Protocol #: PULSE-PHPF-001 (REBUILD), ClinicalTrials.gov Identifier: [id number], **Sponsor is [employer], [place], [place] [address]) Protocol Version Amendment 7 dated 20_12_2021  Investigator Brochure Version 11.0 approved 29 Jul 2020  Clinical Research Coordinator / Research RN note : This visit for Subject Tanner Bishop with DOB: 1946-10-09 on 04/22/2021 for the above protocol is Visit/Encounter #2 and is for purpose of research.   The consent for this encounter is under Protocol Version Amendment 7 dated 20_12_2021, Investigator Brochure Version 11.0 approved 29 Jul 2020,and is currently IRB approved.   During the visit on 04/22/2021, all Visit #2 Run-In procedures were completed per protocol.  For complete details, please see the subject binder.   Signed by  Lazaro Arms Clinical Research Coordinator [facility]  [place], [place] 12:02 PM 04/22/2021

## 2021-04-22 NOTE — Telephone Encounter (Signed)
Entered in error

## 2021-04-25 DIAGNOSIS — J8410 Pulmonary fibrosis, unspecified: Secondary | ICD-10-CM | POA: Diagnosis not present

## 2021-04-30 ENCOUNTER — Telehealth: Payer: Self-pay | Admitting: Pharmacist

## 2021-04-30 DIAGNOSIS — Z6825 Body mass index (BMI) 25.0-25.9, adult: Secondary | ICD-10-CM | POA: Diagnosis not present

## 2021-04-30 DIAGNOSIS — M5416 Radiculopathy, lumbar region: Secondary | ICD-10-CM | POA: Diagnosis not present

## 2021-04-30 DIAGNOSIS — R030 Elevated blood-pressure reading, without diagnosis of hypertension: Secondary | ICD-10-CM | POA: Diagnosis not present

## 2021-04-30 NOTE — Chronic Care Management (AMB) (Signed)
    Chronic Care Management Pharmacy Assistant   Name: Tanner Bishop  MRN: [id number] DOB: [birth year]  Reason for Encounter: General Adherence Call       Recent office visits:  None  Recent consult visits:  . 04.21.2022 [name], MD- Present for well exam . 03.29.2022 Brand Males, MD Pulmonary Disease patient present for pulmonary function test and follow-up . 02.25.2022 [facility] visits:  Medication Reconciliation was completed by comparing discharge summary, patient's EMR and Pharmacy list, and upon discussion with patient.  Admitted to the hospital on 03.11.2022 due to Right Heart Cath. Discharge date was 03.11.2022. Discharged from [facility]?Medications Started at [facility] Discharge:?? -started None  Medication Changes at Hospital Discharge: -None  Medications Discontinued at Hospital Discharge: -None  Medications that remain the same after Hospital Discharge:??  -All other medications will remain the same.    Medications: Outpatient Encounter Medications as of 04/30/2021  Medication Sig  . chlorpheniramine-HYDROcodone (TUSSIONEX PENNKINETIC ER) 10-8 MG/5ML SUER Take 5 mLs by mouth 2 (two) times daily as needed for cough (Do not take with other sedating medication).  . diphenhydrAMINE (BENADRYL) 25 MG tablet Take 25 mg by mouth at bedtime as needed for allergies or sleep.  . flecainide (TAMBOCOR) 50 MG tablet Take 1 tablet (50 mg total) by mouth 3 (three) times daily.  . fluticasone (FLONASE) 50 MCG/ACT nasal spray SPRAY 2 SPRAYS INTO EACH NOSTRIL EVERY DAY (Patient taking differently: Place 2 sprays into both nostrils daily as needed for allergies.)  . OFEV 150 MG CAPS TAKE 1 CAPSULE BY MOUTH TWICE DAILY WITH FOOD. (Patient taking differently: Take 150 mg by mouth 2 (two) times daily.)  . pantoprazole (PROTONIX) 40 MG tablet TAKE 1 TABLET BY MOUTH TWICE A DAY  . predniSONE (DELTASONE) 5 MG tablet  Take 1 tablet (5 mg total) by mouth daily with breakfast.  . zolpidem (AMBIEN) 5 MG tablet TAKE 1 TABLET BY MOUTH EVERY DAY AT BEDTIME AS NEEDED FOR SLEEP (Patient taking differently: Take 5 mg by mouth at bedtime as needed for sleep.)   No facility-administered encounter medications on file as of 04/30/2021.   I spoke with the patient and discussed medication adherence. He states that he received a steroid injection today. The patient stated that he's on a new trial breathing treatment from his pulmonologist. He states that he will be on this for at least four months. There are no side effects from his current medications. There have been no recent changes in his medications. In March patient did have a Right heart Cath, in which cardiology is following him. He states that he continues to walk to try to reduce the pain in his leg. There have been no other urgent care or emergency department visits since his last CPP or PCP visit. He denies any issues with his current pharmacy. His next CCM appointment is scheduled for August 2022.   Star Rating Drugs: None  Amilia Revonda Standard, [place] Pharmacist Assistant [phone]

## 2021-05-05 ENCOUNTER — Other Ambulatory Visit (HOSPITAL_COMMUNITY): Payer: PPO

## 2021-05-07 ENCOUNTER — Ambulatory Visit: Payer: PPO | Admitting: Internal Medicine

## 2021-05-07 ENCOUNTER — Other Ambulatory Visit: Payer: Self-pay | Admitting: Internal Medicine

## 2021-05-07 DIAGNOSIS — J84112 Idiopathic pulmonary fibrosis: Secondary | ICD-10-CM

## 2021-05-07 DIAGNOSIS — R053 Chronic cough: Secondary | ICD-10-CM

## 2021-05-07 MED ORDER — HYDROCOD POLST-CPM POLST ER 10-8 MG/5ML PO SUER: 5.0000 mL | Freq: Two times a day (BID) | ORAL | 0 refills | Status: DC | PRN

## 2021-05-07 MED ORDER — DOXYCYCLINE HYCLATE 100 MG PO TABS: 100.0000 mg | ORAL_TABLET | Freq: Two times a day (BID) | ORAL | 0 refills | Status: DC

## 2021-05-07 MED ORDER — PREDNISONE 10 MG PO TABS: ORAL_TABLET | ORAL | 0 refills | Status: DC

## 2021-05-07 MED ORDER — HYDROCOD POLST-CPM POLST ER 10-8 MG/5ML PO SUER: 5.0000 mL | Freq: Two times a day (BID) | ORAL | Status: DC | PRN

## 2021-05-07 NOTE — Telephone Encounter (Signed)
    Take prednisone 40 mg daily x 2 days, then 17m daily x 2 days, then 140mdaily x 2 days, then 36m18maily x 2 days and stop OR BASELINE dose if he is on baseline dose  Take doxycycline 100m21m twice daily x 5 days; take after meals and avoid sunlight  Should get covid antigen tested - if positive, inform us. [place] negative -> then shuld get PCR tested  [facility]   No Known Allergies

## 2021-05-07 NOTE — Telephone Encounter (Signed)
Called and spoke with Patient.  Patient stated he is starting to have some chest congestion, cough, with yellow phlegm, and a runny nose.  pateint denies fever or chills. Patient stated he gets like this with changes in the weather and stated he is normally give Prednisone and a antibiotic. Patient is also requesting a refill of his Tussionex to help with cough. Patient request [facility].  Message routed to [facility]

## 2021-05-07 NOTE — Telephone Encounter (Signed)
I spoke with the pt and went over recommendations per Dr Chase Caller  He verbalized understanding  I have sent the rxs for the doxy and pred  He is aware tussionex to be sent as well   MR- I pended the rx for tussionex for you. We are not able to phone this in b/c of the hydrocodone. Thanks!

## 2021-05-07 NOTE — Telephone Encounter (Signed)
Done. Malena Catholic Leslie/EMily  ccl Research team because he has an adverse event on study protocol

## 2021-05-08 ENCOUNTER — Ambulatory Visit: Payer: PPO | Admitting: Internal Medicine

## 2021-05-09 ENCOUNTER — Telehealth: Payer: Self-pay | Admitting: Internal Medicine

## 2021-05-09 NOTE — Telephone Encounter (Signed)
Left message for patient to call back.

## 2021-05-13 ENCOUNTER — Other Ambulatory Visit: Payer: Self-pay | Admitting: Internal Medicine

## 2021-05-13 NOTE — Telephone Encounter (Signed)
He erached out to me via support group leader on Friday 05/09/21 sayin he had covid. Also wife. I called in Paxlovid for both of thtem  Plan  =- please check on his status if he is doing well   - please find out hiw wife dob (I forgot now) . Her name is Najir Roop and send a pphone note on wife to me -> I will document the paxlovid call in - am copying research staff so they are aware

## 2021-05-13 NOTE — Telephone Encounter (Signed)
Attempted to call pt but unable to reach. Left message for him to return call.

## 2021-05-19 ENCOUNTER — Other Ambulatory Visit: Payer: Self-pay | Admitting: Internal Medicine

## 2021-05-26 DIAGNOSIS — M5126 Other intervertebral disc displacement, lumbar region: Secondary | ICD-10-CM | POA: Diagnosis not present

## 2021-05-26 DIAGNOSIS — J8410 Pulmonary fibrosis, unspecified: Secondary | ICD-10-CM | POA: Diagnosis not present

## 2021-05-26 DIAGNOSIS — R030 Elevated blood-pressure reading, without diagnosis of hypertension: Secondary | ICD-10-CM | POA: Diagnosis not present

## 2021-05-26 DIAGNOSIS — Z6826 Body mass index (BMI) 26.0-26.9, adult: Secondary | ICD-10-CM | POA: Diagnosis not present

## 2021-06-05 ENCOUNTER — Telehealth: Payer: Self-pay | Admitting: Internal Medicine

## 2021-06-05 ENCOUNTER — Telehealth: Payer: Self-pay

## 2021-06-05 DIAGNOSIS — J84112 Idiopathic pulmonary fibrosis: Secondary | ICD-10-CM

## 2021-06-05 NOTE — Telephone Encounter (Signed)
MR, please advise on this what you want to do, if you want to work pt into your schedule tomorrow 6/24.

## 2021-06-05 NOTE — Telephone Encounter (Signed)
   [facility] Pre-operative Risk Assessment    Patient Name: Tanner Bishop  DOB: 10-01-1946  MRN: [id number]   HEARTCARE STAFF: - Please ensure there is not already an duplicate clearance open for this procedure. - Under Visit Info/Reason for Call, type in Other and utilize the format Clearance MM/DD/YY or Clearance TBD. Do not use dashes or single digits. - If request is for dental extraction, please clarify the # of teeth to be extracted. - If the patient is currently at the dentist's office, call Pre-Op APP to address. If the patient is not currently in the dentist office, please route to the Pre-Op pool  Request for surgical clearance:  What type of surgery is being performed? Lumbar Microdiscectomy  When is this surgery scheduled? TBD   What type of clearance is required (medical clearance vs. Pharmacy clearance to hold med vs. Both)? Medical   Are there any medications that need to be held prior to surgery and how long? N/A    Practice name and name of physician performing surgery? [facility]   Dr Consuella Lose   What is the office phone number? [phone] ext [phone]   7.   What is the office fax number? [phone]  8.   Anesthesia type (None, local, MAC, general) ? General Anesthesia   Ulice Brilliant T 06/05/2021, 5:19 PM  _________________________________________________________________   (provider comments below)

## 2021-06-05 NOTE — Telephone Encounter (Signed)
Lm for patient.  

## 2021-06-05 NOTE — Telephone Encounter (Signed)
Ok I think is not an emergency . He can be worked in next week with app . Also se ehis PCP [name], MD

## 2021-06-05 NOTE — Telephone Encounter (Signed)
Spoke to patient and relayed below message. Patient stated that anesthesiologist stated that this needed to be addressed ASAP, as he could hear fluid in his lungs.  Per patient, anesthesiologist recommended that patient be cleared by pulmonary. There is no availability with NP's next week.  MR, please advise. Thanks

## 2021-06-05 NOTE — Telephone Encounter (Signed)
The fluid in his chest that anesthesiologist heard was probably pulmonary crackles from pulmonary fibrosis and not actually fluid  His last echo was February 2022 His last CT scan was February 2022 His last blood work was April 2022  He just had COVID so it is possible that the [place] is flared up his pulmonary fibrosis  What was the surgery again?  Plan -Do spirometry and DLCO -Do CBC, chemistry, BNP, liver function test -Get a chest x-ray done -I can see him on June 13, 2021 at noon as a special add-on [clinic in the afternoon with a lot of patients].  My clinic tomorrow is also full and I have stuff in the afternoon

## 2021-06-05 NOTE — Telephone Encounter (Signed)
I do not see any imaging on epic after feb 2022. Who did imaging on him to report fluids? When was it done? Was it CT chest or CXR? Please LMK. I might just be able tog et some investigations done today and can decide steps based on t hsoe results  Thanks  MR

## 2021-06-05 NOTE — Telephone Encounter (Signed)
Pt returning a phone call. Pt can be reached at [phone].

## 2021-06-05 NOTE — Telephone Encounter (Signed)
Called and spoke with pt and he is aware of MR recs.  Labs and cxr has been ordered and pt will come in ealry on July 1 to have these done.  Pt has been scheduled for the DLCO and the spirometry on 06/30.    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

## 2021-06-05 NOTE — Telephone Encounter (Signed)
He stated that he did not have any imaging. He stated that he was scheduled for surgery and had to cancel due to ankle and feet edema. Surgery was scheduled at [facility] surgical center.   MR, please advise. Thanks

## 2021-06-06 ENCOUNTER — Other Ambulatory Visit: Payer: Self-pay | Admitting: *Deleted

## 2021-06-06 ENCOUNTER — Ambulatory Visit: Payer: PPO | Admitting: Internal Medicine

## 2021-06-06 NOTE — Telephone Encounter (Signed)
He need not wait til July 1 for labs. He can do it anytime

## 2021-06-06 NOTE — Progress Notes (Signed)
Erroneous encounter.

## 2021-06-06 NOTE — Telephone Encounter (Signed)
   Primary Cardiologist: Cristopher Peru, MD  Chart reviewed as part of pre-operative protocol coverage. Given past medical history and time since last visit, based on ACC/AHA guidelines, Tanner Bishop would be at acceptable risk for the planned procedure without further cardiovascular testing.   His RCRI is a class I risk, 0.4% risk of major cardiac event.  He is able to complete greater than 4 METS of physical activity.  Patient was advised that if he develops new symptoms prior to surgery to contact our office to arrange a follow-up appointment.  He verbalized understanding.  I will route this recommendation to the requesting party via Epic fax function and remove from pre-op pool.  Please call with questions.  Jossie Ng. [name] NP-C    06/06/2021, 8:29 AM [address] Office [phone] Fax [phone]

## 2021-06-12 ENCOUNTER — Other Ambulatory Visit: Payer: Self-pay

## 2021-06-12 ENCOUNTER — Ambulatory Visit (INDEPENDENT_AMBULATORY_CARE_PROVIDER_SITE_OTHER): Payer: PPO | Admitting: Internal Medicine

## 2021-06-12 DIAGNOSIS — J84112 Idiopathic pulmonary fibrosis: Secondary | ICD-10-CM | POA: Diagnosis not present

## 2021-06-12 LAB — PULMONARY FUNCTION TEST
DL/VA % pred: 114 %
DL/VA: 4.62 ml/min/mmHg/L
DLCO cor % pred: 59 %
DLCO cor: 13.83 ml/min/mmHg
DLCO unc % pred: 59 %
DLCO unc: 13.83 ml/min/mmHg
FEF 25-75 Pre: 2.68 L/sec
FEF2575-%Pred-Pre: 131 %
FEV1-%Pred-Pre: 62 %
FEV1-Pre: 1.74 L
FEV1FVC-%Pred-Pre: 121 %
FEV6-%Pred-Pre: 54 %
FEV6-Pre: 1.97 L
FEV6FVC-%Pred-Pre: 106 %
FVC-%Pred-Pre: 50 %
FVC-Pre: 1.97 L
Pre FEV1/FVC ratio: 89 %
Pre FEV6/FVC Ratio: 100 %

## 2021-06-12 NOTE — Progress Notes (Signed)
Spirometry/DLCO performed today.

## 2021-06-12 NOTE — Patient Instructions (Signed)
Spirometry/DLCO performed today.

## 2021-06-13 ENCOUNTER — Other Ambulatory Visit (INDEPENDENT_AMBULATORY_CARE_PROVIDER_SITE_OTHER): Payer: PPO

## 2021-06-13 ENCOUNTER — Ambulatory Visit: Payer: PPO | Admitting: Internal Medicine

## 2021-06-13 ENCOUNTER — Encounter: Payer: Self-pay | Admitting: Internal Medicine

## 2021-06-13 ENCOUNTER — Ambulatory Visit (INDEPENDENT_AMBULATORY_CARE_PROVIDER_SITE_OTHER): Payer: PPO

## 2021-06-13 VITALS — BP 130/68 | HR 67 | Temp 98.3°F | Ht 67.5 in | Wt 171.0 lb

## 2021-06-13 DIAGNOSIS — R053 Chronic cough: Secondary | ICD-10-CM | POA: Diagnosis not present

## 2021-06-13 DIAGNOSIS — J84112 Idiopathic pulmonary fibrosis: Secondary | ICD-10-CM | POA: Diagnosis not present

## 2021-06-13 DIAGNOSIS — Z01811 Encounter for preprocedural respiratory examination: Secondary | ICD-10-CM | POA: Diagnosis not present

## 2021-06-13 DIAGNOSIS — M549 Dorsalgia, unspecified: Secondary | ICD-10-CM | POA: Diagnosis not present

## 2021-06-13 DIAGNOSIS — Z5989 Other problems related to housing and economic circumstances: Secondary | ICD-10-CM | POA: Diagnosis not present

## 2021-06-13 DIAGNOSIS — G8929 Other chronic pain: Secondary | ICD-10-CM

## 2021-06-13 DIAGNOSIS — I517 Cardiomegaly: Secondary | ICD-10-CM | POA: Diagnosis not present

## 2021-06-13 DIAGNOSIS — J8410 Pulmonary fibrosis, unspecified: Secondary | ICD-10-CM | POA: Diagnosis not present

## 2021-06-13 HISTORY — PX: BACK SURGERY: SHX140

## 2021-06-13 LAB — CBC WITH DIFFERENTIAL/PLATELET
Basophils Absolute: 0 10*3/uL (ref 0.0–0.1)
Basophils Relative: 0.4 % (ref 0.0–3.0)
Eosinophils Absolute: 0.1 10*3/uL (ref 0.0–0.7)
Eosinophils Relative: 0.7 % (ref 0.0–5.0)
HCT: 44.3 % (ref 39.0–52.0)
Hemoglobin: 15 g/dL (ref 13.0–17.0)
Lymphocytes Relative: 14.3 % (ref 12.0–46.0)
Lymphs Abs: 1.5 10*3/uL (ref 0.7–4.0)
MCHC: 33.8 g/dL (ref 30.0–36.0)
MCV: 96.6 fl (ref 78.0–100.0)
Monocytes Absolute: 0.7 10*3/uL (ref 0.1–1.0)
Monocytes Relative: 6.2 % (ref 3.0–12.0)
Neutro Abs: 8.4 10*3/uL — ABNORMAL HIGH (ref 1.4–7.7)
Neutrophils Relative %: 78.4 % — ABNORMAL HIGH (ref 43.0–77.0)
Platelets: 259 10*3/uL (ref 150.0–400.0)
RBC: 4.58 Mil/uL (ref 4.22–5.81)
RDW: 14.3 % (ref 11.5–15.5)
WBC: 10.6 10*3/uL — ABNORMAL HIGH (ref 4.0–10.5)

## 2021-06-13 LAB — COMPREHENSIVE METABOLIC PANEL
ALT: 14 U/L (ref 0–53)
AST: 19 U/L (ref 0–37)
Albumin: 4.2 g/dL (ref 3.5–5.2)
Alkaline Phosphatase: 49 U/L (ref 39–117)
BUN: 24 mg/dL — ABNORMAL HIGH (ref 6–23)
CO2: 30 mEq/L (ref 19–32)
Calcium: 9 mg/dL (ref 8.4–10.5)
Chloride: 98 mEq/L (ref 96–112)
Creatinine, Ser: 1.16 mg/dL (ref 0.40–1.50)
GFR: 62.25 mL/min (ref >60.00)
Glucose, Bld: 106 mg/dL — ABNORMAL HIGH (ref 70–99)
Potassium: 4.5 mEq/L (ref 3.5–5.1)
Sodium: 135 mEq/L (ref 135–145)
Total Bilirubin: 0.4 mg/dL (ref 0.2–1.2)
Total Protein: 7.3 g/dL (ref 6.0–8.3)

## 2021-06-13 LAB — HEPATIC FUNCTION PANEL
ALT: 14 U/L (ref 0–53)
AST: 19 U/L (ref 0–37)
Albumin: 4.2 g/dL (ref 3.5–5.2)
Alkaline Phosphatase: 49 U/L (ref 39–117)
Bilirubin, Direct: 0.1 mg/dL (ref 0.0–0.3)
Total Bilirubin: 0.4 mg/dL (ref 0.2–1.2)
Total Protein: 7.3 g/dL (ref 6.0–8.3)

## 2021-06-13 LAB — PROTIME-INR
INR: 1 ratio (ref 0.8–1.0)
Prothrombin Time: 10.9 s (ref 9.6–13.1)

## 2021-06-13 LAB — BASIC METABOLIC PANEL
BUN: 24 mg/dL — ABNORMAL HIGH (ref 6–23)
CO2: 30 mEq/L (ref 19–32)
Calcium: 9 mg/dL (ref 8.4–10.5)
Chloride: 98 mEq/L (ref 96–112)
Creatinine, Ser: 1.16 mg/dL (ref 0.40–1.50)
GFR: 62.25 mL/min (ref >60.00)
Glucose, Bld: 106 mg/dL — ABNORMAL HIGH (ref 70–99)
Potassium: 4.5 mEq/L (ref 3.5–5.1)
Sodium: 135 mEq/L (ref 135–145)

## 2021-06-13 LAB — APTT: aPTT: 27 s (ref 23.4–32.7)

## 2021-06-13 LAB — BRAIN NATRIURETIC PEPTIDE: Pro B Natriuretic peptide (BNP): 24 pg/mL (ref 0.0–100.0)

## 2021-06-13 NOTE — Progress Notes (Signed)
#IPF - uip path 03/22/13  - On OFEV since early May 2015   PFT FVC fev1 ratio BD fev1 TLC DLCO Walk test 168f x 3 laps wt rx             Spring 2014 2.9:L L/% /%        June 2015 2.7L/67% 2.34L/78% 86  3.68/57% 20.24/71%   ofev start  [name]  2016 2.5L/55% 2.1L/60% 84/108%    No desat, Pk HR 130    02/11/2015 Screening visit afferent cough study 2.59L/56% 2.24L/63% 87/112%    Dx with afluttter on ekg   Screen failed due to hx of stones  03/26/2015        No desat, PK HR 130    08/26/15 pft machine 2.73L/68% 2.38L/80% 87   [name]     06/29/2016 Office spiro 2.49L/56% 2.14L/65%     Pk HR 90 and lowest pulse ox 99% ->93%  ofev  11/02/2016  office full pft  2.61L/66% 2.3L/79%    17.23/60%   pfev  09/16/2017  2.53L/64%      Pulse ox 99% -> 93%, HR 81- > 84  ofev  12/22/2017        100% -> 97%,  HR 66 -> 96    09/19/2018        100% -> 93, HR 74 -> 85         OV 03/26/2015  Chief Complaint  Patient presents with   Follow-up    [name], dry cough. CATH procedure on 04/08/15. Denies any chest tightnes/congestion.    75year old male with idiopathic pulmonary fibrosis. He is on Ofev after having failed Esbriet in the past due to side effects. He is tolerating Ofev well. Liver function test in March 2016 was normal. I last saw him in January 2016. Subsequently in February 2016 we screened him for a cough idiopathic pulmonary fibrosis study called afferent but he screen failed due to history of renal stones. At this time he was incidentally diagnosed with new onset atrial flutter. He has seen Dr. GCrissie Sicklesand has been started on anticoagulation with Xarelto and metoprolol. He is due to undergo a ablation. He is not aware of the increased bleeding risk with Ofev in the setting of anticoagulation. He assures me that the anti-coag and is only until he undergoes ablation and after that the plan is to stop it. Therefore only short-term anticoagulation with Xarelto. Overall  his effort tolerance is fine. He has postponed his [facility] lung transplant until he completes his ablation.  Walking desaturation test in the office today he did not desaturate. This is stable. Spirometry not done   Past medical history: Atrial flutter new diagnoses as above   OV 06/29/2016  Chief Complaint  Patient presents with   Follow-up    pt states he is at baseline: sob with exertion, nonprod cough.      75year old male with idiopathic pulmonary fibrosis. He is on Ofev . He completed 6 months of PRAISE study (([id number] placebo) in l;ate winter/early spring 2017. This is SPukalanivisit. Overall stable. No worsening dyspnea and cough. Continues daily exercise is walking many miles. Some days he is sitting more than the others. He is interested in more trials and results of the praise study. These results are not out yet. He is compliant with his Ofe last liver function test was in February 2017. There are no new issues. He believes his atrial fibrillation is in  sinus rhythm;    OV  11/02/2016  Chief Complaint  Patient presents with   Follow-up    4 month IPF follow up and B&A review - does report increased prod cough with yellow mucus, wheezing, tightness in chest, increased SOB, x3-4 weeks with the weather change.  denies any f/c/s, hemoptysis, chest pain   IPF - on ofev. Last liver function test was in July 2017 and normal. Overall dyspnea stable. However he is been having diarrhea with ofev. He called in and we reduced it to 1 tablet a day and this improved the diarrhea. He back on 1 tablet twice a day. The diarrhea has returned although it is much milder. He has never taken any medication for this. Are function test shows slight improvement from July 2017 and similar levels to approximately one year ago  The new issue that he's having significant recurrent sinus infection in the back of chronic postnasal drainage. This since the fall 2017. He says that he and his wife catch respirator  infection and passive back and forth to each other. He is having cough, chest tightness, postnasal drip and yellow sputum. It is not affecting any dyspnea. There is no wheeze. There is no fever or hemoptysis.  OV 03/02/2017  Chief Complaint  Patient presents with   Follow-up    Pt states his breathing is at baseline. Pt c/o dry cough - pt states this is baseline. Pt denies CP/tightness.     Idiopathic pulmonary fibrosis on Ofev. Last seen November 2017. He is a routine follow-up.  He is here with his wife. His sinus infections a result. He is interested in research protocols but we have none right now. He is tolerating his Ofev associated with some mild diarrhea occasional which she takes medication. He is not much of a problem. He tells me that she walks 6 times a week for 5 miles a day. On the treadmill at a 5 incline walking at 3-1/2 miles an hour. For this he feels a little more dyspneic than before. Walking desaturation test 185 feet 3 laps on room air: His pulse ox dropped from 98% at rest and 93% with exertion. His heart rate jumped from 66 at rest and 97 at peak exertion. Features are suggestive of mild progression of the disease. He is not attending pulmonary fibrosis foundation because he felt this was negative but that was years ago. We discussed this and he might be interested again.     OV 09/16/2017  Chief Complaint  Patient presents with   Follow-up    PFT done today. Pt states that his breathing is gradually getting worse. SOB which depends if it is on exertion vs all the time and c/o prod cough with yellow mucus. Denies any CP   S IPF on fev followup. Overall doing well. He recent bronchitis and liked anabiotic and prednisone. He felt the prednisone helped his cough just currently rated as mild to moderate in severity. Helped the shortness of breath. He also helped arthralgia. He is now wondering what taking chronic prednisone. We had an extensive discussion about this.  Spirometry shows that the FVC stable compared to earlier this year but slightly progressive compared to one year ago. [facility] interstitial lung disease questionnaire shows symptoms. His wife is here with him. He is interested in research trials. He has participated in distress before. He is asking about opioid refill for cough if I wont do prednisone.   OV 12/22/2017  Chief Complaint  Patient presents  with   Follow-up    Pt states he believes his breathing is at a stable point right now. States that he is coughing which is worse when he first wakes up and then also at night; occ will cough up yellow phlegm.     C.o ofev intolerance with fatigue, weight loss, diarrhea. Does not like lomotil. Wnaant to give it a holiday foir 2-3 weeks. INterested in research protocol - discussed [place]. 5 year cut off since diagnosis coming up 03/22/18. He is overall frustrated ith qualtiy of life. Walks 5 miles; starts feeling dizzy > 3 miles in but not checking pulse ox despite advice. Walking desaturation test on 12/22/2017 185 feet x 3 laps on ROOM AIR:  did noit desaturate. Rest pulse ox was 100%, final pulse ox was 97%. HR response 66/min at rest to 96/min at peak exertion. Patient Milbern Weatherwax  Did not Desaturate < 88% . Godfrey Tarango yes  Desaturated </= 3% points. Herschell Newburn yes did get tachyardic     Walking desaturation test on 02/01/2018 185 feet x 3 laps on ROOM AIR:  did not desaturate. Rest pulse ox was 100%, final pulse ox was 91%. HR response 67/min at rest to 85/min at peak exertion. Patient Brand Crossin  dod not Desaturate < 88% . Niel Selbe yes diod  Desaturated </= 3% points. Meiko Vilchis did not get tachyardic   OV 02/01/2018  Chief Complaint  Patient presents with   Follow-up    Pt states he has been doing good since last visit. Started back on OFEV 12/24/17 after taking a holiday off of it.     FU IPF  Took ofev holiday. And then restarted 01/24/18 and doing well. [place] ofev  ok. KBILD ok.   OV 03/16/2018'  . Chief Complaint  Patient presents with   Follow-up    PFT done today. States he has been doing well and denies any current complaints. Tolerating OFEV well and has been walking about 5 miles every day with no complaints.   Ms. Groft returns for follow-up of idiopathic pulmonary fibrosis.  He is now back on nintedanib after giving it a holiday for a few weeks.  He is tolerating it well and no problems.  He goes for daily walks and has no problems.  His lung function was reviewed today and it shows for the first time a significant decline of greater than 200 cc between 2 time points.  This correlates with him not taking over for a few weeks nevertheless overall he is happy with his health.  He is not interested in research trials anymore.  He has a new question about travel advice encounter.  He plans an [place] from [place], [place] intrajejunal in [place] for 10 days.  This will start on May 22, 2018.  He wants to make sure it is safe for him to go.  He said he has never been on a cruise and he and his wife have a strong desire to go as part of his goals of care.   OV 05/16/2018  Chief Complaint  Patient presents with   Follow-up    Breathing is unchanged since last OV.    Tanner Bishop , 75 y.o. , with dob [birth year] and male ,Not Hispanic or Latino from [address] [place] [address] - presents to pulm ILD clinic for IPF.  He presents with his wife.  And just under 7 days he is going to embark on a [place] cruise.  This visit is precautionary visit just before that.  Most recent visit was in April 2019.  At this point in time in terms of his IPF he feels stable.  He is compliant with full dose of 5 and is having only mild diarrhea.  Other than that he is tolerating it just fine.  He is looking forward to his cruise.  He had pulmonary function test today that shows an improvement compared to last visit and it is very similar to  summer/fall 2018 in terms of FVC and DLCO.  Although he does feel like he is coming down with an acute bronchitis and wants to take some antibiotic and prednisone I had of the trip.  We also discussed about him having prophylactic antibiotics handy.  He wants a refill on his Tussionex for cough.  At this point in time he is past 5 years of diagnosis and not eligible for research trials he is not interested in transplant although the recent pulmonary fibrosis foundation meeting he did hear from a lot of transplant patients about the individual personal experience.       OV 09/19/2018  Subjective:  Patient ID: Tanner Bishop, male , DOB: March 05, 1946 , age 65 y.o. , MRN: [id number] , ADDRESS: [place] [id number]   09/19/2018 -   Chief Complaint  Patient presents with   Follow-up    Pt saw cards 08/23/18. States he is still having problems with his heart going in and out of rhythm and states he has had some episodes to where he almost passes out. States his breathing is at a stable point. States he also has  an occ cough.     HPI Aleksandar Wacha 75 y.o. -presents 5-year follow-up.  I personally saw him in June 2019.  Then approximately a month ago he presented and saw acutely my nurse practitioner because of dizziness.  It was felt to be A. fib RVR.  He subsequently saw Dr. Crissie Sickles his electrophysiologist.  I reviewed the note.  Flecainide was started.  He says despite that l approximately 10 weeks ago he had another episode of dizziness while getting out of the shower.  He was tachycardic with a heart rate of 140s.  He felt he might pass out but there is no focal neurologic deficits or abnormal sensation.  His pulse ox was normal at 97% at that time.  It happened at rest.  He says he has been walking on the treadmill for several miles and he does not desaturate.  He says his pulse ox is 93% at that time.  He never drops into the 80s.  He does not think his dizziness and palpitation  issues are related to his pulmonary fibrosis.  However [name] is wondering about oxygen drops during this time.  He is not tolerating his nintedanib at full dose without any problems.  He has had a successful [place] trip.  He is participating in the patient support group.  He is up-to-date with his flu shot      OV 10/13/2018  Subjective:  Patient ID: Tanner Bishop, male , DOB: [birth year] , age 89 y.o. , MRN: [id number] , ADDRESS: [place] [id number]   10/13/2018 -   Chief Complaint  Patient presents with   Follow-up    review PFT.  c/o baseline sob with exertion.       HPI Matteus Vastine 75 y.o. -presents for follow-up of IPF to the ILD clinic.  This visit  is arranged because letter physiologist [name] was concerned about hypoxemia effects on his atrial fibrillation.  At this point in time patient tells me that after increasing dose of flecainide his atrial fibrillation and palpitations have improved and resolved.  Overall he feels stable.  On September 26, 2018 he had 6-minute walk test and this was pretty robust with walking 384 meters and with lowest pulse ox of 95%.  Then on October 05, 2018 he had overnight pulse oximetry that shows 12 minutes of desaturation less than 88%.  He is not interested in oxygen.  Then he called his October 07, 2018 with worsening bronchitis episodes and we gave him 5 days of prednisone and antibiotics.  He finished his last dose yesterday.  Overall he feels stable but pulmonary function test today shows decline in both FVC and DLCO.  And then when I questioned him he felt like he still had some residual bronchiti and feels another course of prednisone could help him.  There are no other new issues.  Last liver function test October 2019 and was normal.      OV 01/12/2019  Subjective:  Patient ID: Tanner Bishop, male , DOB: 09-27-1946 , age 66 y.o. , MRN: [id number] , ADDRESS: [place] [id number]   01/12/2019 -    Chief Complaint  Patient presents with   Follow-up    PFT performed today. Pt states he is about the same since last visit.States he still becomes SOB with exertion, has a lot of coughing but has been taking mucinex, and also has had some CP or chest tightness.     HPI Takai Nin 75 y.o. -returns for follow-up of his IPF.  He is now more than 5 years since diagnosis.,  April 2020 he will be 6 years since diagnosis.  He continues to exercise well on the treadmill and according to his wife he is does well.  He is not dropping oxygen at this time.  He does not have a cough when needed works on the tile.  However at other times the day especially when he is trying to lie down to bed or when he gets up in the morning and goes to the bathroom he has really bad cough.  Overall the cough severity is now 6 out of 10 in terms of how it is impacting his quality of life.  He tells me that every time he takes prednisone for the cough he feels better but then when he comes off prednisone cough gets worse.  He said multiple prednisone courses of brief duration for this chronic cough.  In addition for the last few weeks has had yellow sinus drainage and sinus headache and sinus congestion and he feels this is again making his chronic cough worse.  He is open to taking antibiotic and prednisone course.  In terms of taking nintedanib he has no side effects.  His last liver function test reviewed was in October 2019 and it was normal at that time.  Overall at this point in time he really wants good significant support for his cough in terms of affecting his quality of life.    ROS - per HPI     OV 02/16/2019  Subjective:  Patient ID: Tanner Bishop, male , DOB: 02-06-1946 , age 20 y.o. , MRN: [id number] , ADDRESS: [address] [place] [address]   02/16/2019 -   Chief Complaint  Patient presents with   Follow-up    HRCT and sinus  CT performed 2/18. Pt states he is about the same as last visit and states  SOB is about the same. Pt still has a dry cough. Denies any complaints of CP/chest tightness.     HPI Marion Liss 75 y.o. -returns for follow-up of his IPF.  He presents with his wife.  He is here to review the test results.  He had high-resolution CT chest and CT sinus.  These were done in order to reevaluate his disease because he is having significant cough.  His high-resolution CT chest shows only mild progression in his IPF in 3 years.  However there is significant new findings of tracheobronchomalacia although there is no lung cancer.  He also has two-vessel coronary artery calcification.  These are new findings.  Since last visit and a sinus episode he is stable.  He is currently run out of his nasal steroids.  He says the pharmacy said that I declined his prescription request.  I do not recollect such a conversation.  In any event he is now going to participate in the cough study called scenic.  He has received a copy of the consent form.  He is rated.  He has an appointment for his consent visit.  It is possible that the nasal steroid is on the exclusion list but I do not have the exclusion list with me.  Anyway he is not consented.  He is not having chest pain when he does his treadmill exercises.  He works out hard.  He states he gets tachycardic.  He says he has a 6 times that he will die from his heart condition before his pulmonary fibrosis.  He has upcoming appointment with his cardiologist [name] for atrial fibrillation.  ...................................................................... OV 06/08/2019   Subjective:  Patient ID: Tanner Bishop, male , DOB: September 01, 1946 , age 73 y.o. , MRN: [id number] , ADDRESS: [place] [id number]   06/08/2019 -   Chief Complaint  Patient presents with   Follow-up    1wk f/u for IPF. Patient stated that he was given a round of prednisone last week for some SOB but is feeling much better.      HPI Joelle Rajagopalan 75 y.o.  -returns for IPF follow-up.  Last seen in March 2020.  Since the pandemic started he has been social distancing.  He has not attended any support group.  He has been tolerating his Ofev quite well without any difficulty.  Recently had a bronchitis exacerbation and we gave prednisone.  This resolved his cough.  He was going to participate in a cough study but because of the pandemic the study got canceled.  He tells me that every time he takes prednisone the cough goes away.  He is very interested in taking daily prednisone for symptom relief of cough.  We discussed the side effects of prednisone that include weight gain, easy bruising, adrenal insufficiency, osteoporosis, cataracts hypertension, diabetes, immunosuppression.  Despite all this he wants to take a low-dose prednisone daily.  His last liver function test was in March 2020.  This needs to be retested because he is on nintedanib.  His symptom scores at walk test show relative stability.  His last pulmonary function test was in January 2020 but because of the pandemic is on hold.  We are using symptom and walk test to determine progression.         OV 04/11/2020  Subjective:  Patient ID: Tanner Bishop, male , DOB: 01-15-1946 , age 15  y.o. , MRN: [id number] , ADDRESS: [place] [id number]   04/11/2020 -   Chief Complaint  Patient presents with   Follow-up    PFT performed today. Pt states he feels like his breathing is gradually becoming worse and states it could be at any time that he is SOB.   #IPF - uip path 03/22/13  - On OFEV since early May 2015 -On chronic daily prednisone because of cough since 2020  HPI Decari Schiavi 75 y.o. -presents with his wife IPF follow-up.  Last saw him June 2020.  After that he feels his dyspnea is worse.  He says that he could do several miles in one 1 hour and 30 minutes on the treadmill.  The same distance and now taking 1 hour and 40 minutes.  He feels his disease is getting worse.   He says the prednisone is working well for him because of improved cough and wellbeing.  He is having some skin bruising because of that.  The nintedanib he is tolerating well except for some mild diarrhea.  On objective symptom score is actually similar to 1 year ago but may be slightly worse than 6 months ago or 9 months ago.  On pulmonary function testing he is stable compared to 1 year ago but slow and steadily he is definitely worse over the many years.  We noticed on his walking desaturation test that he did not mount a tachycardic response as he is done in the past.  He is on flecainide for atrial fibrillation.  He takes his 3 times daily.  He feels his atrial fibrillation is under good control.  There is no chest pain.  There is no weight loss.  Is no loss of physical conditioning.  He is interested in clinical trials.   A month ago he had nausea and his amylase/lipase was elevated.  He says he is fine now.  Primary care address this. ROS - per HPI  IMPRESSION: 1. 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 Findings are consistent with UIP per consensus guidelines: Diagnosis of Idiopathic Pulmonary Fibrosis: An Official ATS/ERS/JRS/ALAT Clinical Practice Guideline. Richardson, Iss 5, [phone], Aug 14 2017. 2. 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 3. Two-vessel coronary atherosclerosis.   Aortic Atherosclerosis (ICD10-I70.0).     Electronically Signed   By: Ilona Sorrel M.D.   On: 01/31/2019 12:24  OV 06/11/2020  Subjective:  Patient ID: Tanner Bishop, male , DOB: 08-29-1946 , age 75 y.o. , MRN: [id number] , ADDRESS: [place] [id number]   06/11/2020 -   Chief Complaint  Patient presents with   Follow-up    PFT performed today.  Pt states he has been doing good since last visit. States breathing is about  the same.,    #IPF - uip path 03/22/13  - On OFEV since early May 2015 -On chronic daily prednisone because of cough since 2020  HPI Tyrion Custer 75 y.o. -returns to clinic for ILD follow-up.  In the interim he feels that his wife has worsening dementia.  She is able to self-care but only does minimal cooking.  She is more irritable.  His daughter Theadora Rama is now on disability because of back issues.  Patient has a son in [place] who he is close with.  Together they take care of his wife.  He says he has  become the primary caregiver now for his wife.  Nevertheless she is functional and does some ADLs.  He continues with nintedanib 150 mg twice daily.  Overall tolerance is good just mild diarrhea.  This is baseline.  Shortness of breath is baseline symptom score is 14 and similar to the past.  Pulmonary function test appears stable.  Walking desaturation test is stable.  His last liver function test was 2 months ago.  His main issue is that he is having some financial issues covering the cost of nintedanib.  He $6000 grant is running out.  He only has 1 month supply left.  He wants to meet with the pharmacist.      OV 11/12/2020   Subjective:  Patient ID: Tanner Bishop, male , DOB: 09-07-1946, age 32 y.o. years. , MRN: [id number],  ADDRESS: [place] [address] PCP  [name], MD Providers : Treatment Team:  Attending Provider: Brand Males, MD Patient Care Team: [name], MD as PCP - General Brand Males, MD as Consulting Physician (Pulmonary Disease) [place], Anne Ng, [facility] as Pharmacist (Pharmacist)    Chief Complaint  Patient presents with   Follow-up    IPF, still getting SOB on DOE with some chest tightness       HPI Romney Youtz 75 y.o. -IPF follow-up.  Presents with his wife who I am seeing after a long time.  According to the patient patient himself is stable from IPF standpoint.  However he says for the last several months he has  had an exacerbation in his chronic back pain.  He has significant pain.  He has had steroid injections.  The first 1 was successful but the second 1 and subsequent failed.  He is not able to do his walks as he used to in the past.  He has gained some weight.  He is frustrated by this.  He is asking if he can go see a Restaurant manager, fast food.  The other issue with him was elevated amylase.  We never found a cause for this.  So his primary care physician worked him up he had abdominal CT imaging on 08/16/2020 and this was normal.  Otherwise he is okay  He is asking for a refill on his prednisone that he takes for cough.  He also wants Tussionex as needed.  He takes that for cough.  He also says that he got summoned for jury duty.  He is wondering what to do.  I told him under no circumstances he should ever do jury duty.  I worry about the risk   ROS - per HPI  OV 01/24/2021  Subjective:  Patient ID: Tanner Bishop, male , DOB: 08/24/1946 , age 24 y.o. , MRN: [id number] , ADDRESS: [address] [place] [address] PCP [name], MD Patient Care Team: [name], MD as PCP - General Brand Males, MD as Consulting Physician (Pulmonary Disease) [place], Anne Ng, [facility] as Pharmacist (Pharmacist)  This Provider for this visit: Treatment Team:  Attending Provider: Brand Males, MD    01/24/2021 -   Chief Complaint  Patient presents with   Acute Visit    Increased SOB since last Friday, heart still feels "funny"   Follow-up idiopathic pulmonary fibrosis On nintedanib and low-dose prednisone for cough Idiopathic elevation in amylase unrelated to nintedanib.  Normal abdominal imaging.  HPI Santonio Acero 75 y.o. -returns for an acute visit.  On Friday, 01/22/2021 he says he got up and he felt like baseline  and then when he walked he suddenly noticed that he was suddenly way more short of breath than usual.  His heart started feeling funny.  He took an acute visit with cardiology.  I  reviewed the notes.  I do not see an EKG but he tells me an EKG was done and he was in sinus rhythm.  He had normal troponin, normal BNP and normal D-dimer.  Chest x-ray was baseline.  I personally visualized this.  Therefore he was sent to pulmonary.  We are seeing him as an acute visit today.  His symptom scores are definitely worse than baseline.  He is tolerating his nintedanib and his weight is stable.  His walking desaturation test shows a sudden decline.  For the first time his pulse ox is below 88%.  His pace of walking was much slower.  He tells me he still feels funny in his chest.  There are no Covid symptoms.  He is fully vaccinated.  There is no edema orthopnea proximal nocturnal dyspnea coughing or wheezing.  There is no sputum or fever production.  His wife is here with him.Marland Kitchen    Results for LAZARUS, SUDBURY (MRN [id number]) as of 04/11/2020 12:14  Ref. Range 05/14/2014 15:46 07/24/2015 12:43 08/26/2015 10:52 11/02/2016 09:51 05/19/2017 08:41 09/16/2017 08:47 03/16/2018 08:48 05/16/2018 10:53 10/13/2018 09:52 01/12/2019 09:49 04/11/2020 10:56 06/11/2020   FVC-Pre Latest Units: L 2.71 2.69 2.73 2.61 2.51 2.53 2.29 2.38 2.25 2.21 2.23 2.26  FVC-%Pred-Pre Latest Units: % _0 56 57%  Results for ZINEDINE, ELLNER (MRN [id number]) as of 04/11/2020 12:14  Ref. Range 05/14/2014 15:46 07/24/2015 12:43 08/26/2015 10:52 11/02/2016 09:51 05/19/2017 08:41 09/16/2017 08:47 03/16/2018 08:48 05/16/2018 10:53 10/13/2018 09:52 01/12/2019 09:49 04/11/2020 10:56 06/11/2020   DLCO unc Latest Units: ml/min/mmHg 20.24 15.36 15.36 17.23 15.83  13.10 16.48 14.17 14.38 14.72 13.69  DLCO unc % pred Latest Units: % 71 54 54 60 55  46 58 50 50 62 58%        CT Chest data  DG Chest 2 View  Result Date: 01/23/2021 CLINICAL DATA:  Shortness of breath cardiac palpitations EXAM: CHEST - 2 VIEW COMPARISON:  12/01/2016 FINDINGS: Cardiac shadow is at the upper limits of normal in size but stable. Lungs are well aerated  bilaterally with mild fibrotic scarring stable in appearance from the prior exam. No new focal infiltrate or sizable effusion is seen. No acute bony abnormality is noted. IMPRESSION: Chronic scarring without acute abnormality. Electronically Signed   By: Inez Catalina M.D.   On: 01/23/2021 08:24   CT Chest High Resolution  Result Date: 01/24/2021 CLINICAL DATA:  IPF EXAM: CT CHEST WITHOUT CONTRAST TECHNIQUE: Multidetector CT imaging of the chest was performed following the standard protocol without intravenous contrast. High resolution imaging of the lungs, as well as inspiratory and expiratory imaging, was performed. COMPARISON:  01/31/2019, 03/11/2016, 07/24/2015, 02/14/2013 FINDINGS: Cardiovascular: Aortic atherosclerosis. Cardiomegaly. Scattered left coronary artery calcifications. Enlargement of the main pulmonary artery measuring up to 3.4 cm in caliber. No pericardial effusion. Mediastinum/Nodes: No enlarged mediastinal, hilar, or axillary lymph nodes. Thyroid gland, trachea, and esophagus demonstrate no significant findings. Lungs/Pleura: Redemonstrated moderate pulmonary fibrosis in a pattern with apical to basal gradient, featuring traction bronchiectasis, irregular peripheral interstitial opacity, septal thickening, subpleural bronchiolectasis, and areas of honeycombing at the lung bases. Fibrotic findings are minimally worsened compared to prior examination dated 01/31/2019 and further slightly worsened over a longer period of follow-up dating back  to 02/14/2013. No significant air trapping on expiratory phase imaging. Evidence of prior left lung wedge biopsy. No pleural effusion or pneumothorax. Upper Abdomen: No acute abnormality. Musculoskeletal: No chest wall mass or suspicious bone lesions identified. IMPRESSION: 1. Redemonstrated moderate pulmonary fibrosis in a pattern with apical to basal gradient, featuring traction bronchiectasis, irregular peripheral interstitial opacity, septal thickening,  subpleural bronchiolectasis, and areas of honeycombing at the lung bases. Fibrotic findings are minimally worsened compared to prior examination dated 01/31/2019 and further slightly worsened over a longer period of follow-up dating back to 02/14/2013. Findings are in a UIP pattern and in keeping with reported diagnosis of IPF. 2. Cardiomegaly and coronary artery disease. 3. Enlargement of the main pulmonary artery, as can be seen in pulmonary hypertension. Aortic Atherosclerosis (ICD10-I70.0). Electronically Signed   By: Eddie Candle M.D.   On: 01/24/2021 09:16      PFT  PFT Results Latest Ref Rng & Units 06/11/2020 04/11/2020 01/12/2019 10/13/2018 05/16/2018 03/16/2018 09/16/2017  FVC-Pre L 2.26 2.23 2.21 2.25 2.38 2.29 2.53  FVC-Predicted Pre % 57 56 56 57 60 58 64  FVC-Post L - - 2.25 - - - -  FVC-Predicted Post % - - 57 - - - -  Pre FEV1/FVC % % 88 89 87 86 88 88 88  Post FEV1/FCV % % - - 90 - - - -  FEV1-Pre L 2.00 1.99 1.93 1.93 2.09 2.02 2.23  FEV1-Predicted Pre % 70 69 67 67 73 70 77  FEV1-Post L - - 2.03 - - - -  DLCO uncorrected ml/min/mmHg 13.69 14.72 14.38 14.17 16.48 13.10 -  DLCO UNC% % 58 62 50 50 58 46 -  DLCO corrected ml/min/mmHg 13.69 14.76 - - - 13.14 -  DLCO COR %Predicted % 58 62 - - - 46 -  DLVA Predicted % 96 110 102 94 110 94 -  TLC L - - - - - - -  TLC % Predicted % - - - - - - -  RV % Predicted % - - - - - - -       OV 03/11/2021  Subjective:  Patient ID: Tanner Bishop, male , DOB: 06-Dec-1946 , age 89 y.o. , MRN: [id number] , ADDRESS: [place] [address] PCP [name], MD Patient Care Team: [name], MD as PCP - General Brand Males, MD as Consulting Physician (Pulmonary Disease) Viona Gilmore, [facility] as Pharmacist (Pharmacist)  This Provider for this visit: Treatment Team:  Attending Provider: Brand Males, MD    03/11/2021 -   Chief Complaint  Patient presents with   Follow-up    Follow up after PFT.  Pt stated  that he has the oxygen but these are the larger tanks.  He is waiting to get the POC from [employer].    Follow-up idiopathic pulmonary fibrosis  - On nintedanib and low-dose prednisone for cough  -Start portable oxygen for progressive disease March 2022  Idiopathic elevation in amylase unrelated to nintedanib.  Normal abdominal imaging.   Normal right heart catheterization 02/21/2021 by Dr. Glori Bickers.    - Mean pressure 17.  Wedge pressure 6.  Cardiac index 2.6.  PVR 2.1  HPI Jaymen Estock 75 y.o. -presents for follow-up since his last visit when we noticed exertional hypoxemia.  Got echocardiogram and then sent him to cardiology.  He had right heart catheterization.  The result is reviewed and is normal.  He is tolerating his nintedanib fine.  He has  had problems qualifying for oxygen even though he desaturated.  He says something about insurance did not approve my order.  He is also frustrated with [facility].  He wants to switch to [facility].  He is interested in clinical trial but current phase 3 trials are on hold because of logistic issues with sponsor STARSC[employer] study. Other studies might be available      OV 06/13/2021  Subjective:  Patient ID: Tanner Bishop, male , DOB: Mar 28, 1946 , age 75 y.o. , MRN: [id number] , ADDRESS: [address] [place] [address] PCP [name], MD Patient Care Team: [name], MD as PCP - General Lovena Le Champ Mungo, MD as PCP - Cardiology (Cardiology) Brand Males, MD as Consulting Physician (Pulmonary Disease) Viona Gilmore, [facility] as Pharmacist (Pharmacist)  This Provider for this visit: Treatment Team:  Attending Provider: Brand Males, MD    06/13/2021 -   Chief Complaint  Patient presents with   Follow-up    PFT  performed 6/30 and cxr performed today. Pt states he feels like he is worse since last visit and states he is also coughing more too. Pt did have Covid and since then he has been worse.      HPI Solace  Malta 75 y.o. -presents for follow-up.  This is unscheduled visit.  He tells me that he is scheduled himself for low back surgery because of worsening sciatica with Dr. Kathyrn Sheriff.  This visit was to be done in the outpatient suite.  When he showed up preoperatively the anesthesiologist rejected surgical fitness because of crackles and because anesthesiologist felt there was pedal edema.  Patient himself denies any pedal edema.  [In fact on exam he does not have pedal edema].  He says he was frustrated with experience because cardiology had cleared him for the procedure.  He also reports that crackles were felt to be due to heart failure although he has pulmonary fibrosis.  In talking to him he did have COVID at the end of May 2022.  After that he is slightly worse.  His FVC shows slight decline but his DLCO is stable.  Try to do a walk test on him but because of his sciatica he could not walk.  He says he uses oxygen with exercise and mowing yard.  He is really bothered by his pain.  He was wondering about nonsurgical maneuvers to help his pain.  We talked about Tylenol but within limits.  We talked about chiropractor but he says it did not help him.  We talked about dry needling.  He took a recommendation from me or someone that I personally use for dry needling.  However he thinks he will just proceed with surgery.  I discussed Dr. Kathyrn Sheriff and Dr. Kathyrn Sheriff we will plan the surgery in the hospital.  He wants refill of his cough medication.  His subjective symptom score is stable.  His chest x-ray looks stable.  He will have lab blood work today.       SYMPTOM SCALE - ILD 02/16/2019  06/08/2019  04/11/2020  06/11/2020  11/12/2020 169# weight 01/24/2021 171# 03/11/2021 175# 06/13/2021 174# - covid end emay 2022  O2 use _0  ra ra Uses o2 with exercise. Mowing yard  2Shortness of Breath 0 -> 5 scale with 5 being worst (score 6 If unable to do)         2At rest 1 1 0 _1 Simple tasks -  showers, clothes change, eating, shaving _0 Household (dishes, doing bed, laundry) _1 Shopping _2 Walking level at own pace _3 Walking up Stairs _4 Total (40 - 48) Dyspnea Score _5 Xx - due to recent pred _6 How bad is your fatigue 3 x _7 0  nausea   0 0 0 0 0 0  vomit   0 0 0 0 0 0  diarrhea   _8 anxiety   0 0 0 0 0 0  deopresso   0 0 0 0 0 0       Simple office walk 185 feet x  3 laps goal with forehead probe 09/19/2018  01/12/2019  02/16/2019  06/08/2019  04/11/2020  06/11/2020  11/12/2020  01/24/2021  03/11/2021   O2 used Room air Room air Room air Room air  ra ra ra ra  Number laps completed _9 Comments about pace normal normal normal normal  Mod pace avg [ace Slow pace   Resting Pulse Ox/HR 100% and 73/min 100% ad 74/min 100% and 68/min 98% and 82/min 99% and 60/min 98% and 54 98% and 73/min 94% and 88/min 97% RA at rest  Final Pulse Ox/HR 93% and 85/min 92% and 84/min 93% and 81/min 91% and 96/mni 91% and 74/mm 90% and 71/min 90% and 86/min 87% and 95/min desats to 87% on 2nd lap  Desaturated </= 88% no no no no yes yes yes yes   Desaturated <= 3% points Yes, 7 poin Yes, 8 ponts Yes, 7 points Yes, 7 points Yes, 8point Yes, 8 point Yes, 8 points Yes, 7 points   Got Tachycardic >/= 90/min no no no yes       Symptoms at end of test x none none none dyspnea none None dyspnea dyspnea   Miscellaneous comments x x stable stable but tachy for first time     Corrected with 3L [employer] - just 1 lap. Not all 3 laps tested on 3L [place]         PFT  PFT Results Latest Ref Rng & Units 06/12/2021 03/11/2021 06/11/2020 04/11/2020 01/12/2019 10/13/2018 05/16/2018  FVC-Pre L 1.97 2.10 2.26 2.23 2.21 2.25 2.38  FVC-Predicted Pre % 50 53 57 56 56 57 60  FVC-Post L - - - - 2.25 - -  FVC-Predicted Post % - - - - 57 - -  Pre FEV1/FVC % % 89 86 88 89 87 86 88  Post  FEV1/FCV % % - - - - 90 - -  FEV1-Pre L 1.74 1.81 2.00 1.99 1.93 1.93 2.09  FEV1-Predicted Pre % 62 64 70 69 67 67 73  FEV1-Post L - - - - 2.03 - -  DLCO uncorrected ml/min/mmHg 13.83 11.41 13.69 14.72 14.38 14.17 16.48  DLCO UNC% % 59 48 58 62 50 50 58  DLCO corrected ml/min/mmHg 13.83 11.51 13.69 14.76 - - -  DLCO COR %Predicted % 59 48 58 62 - - -  DLVA Predicted % 114 89 96 110 102 94 110  TLC L - - - - - - -  TLC %  Predicted % - - - - - - -  RV % Predicted % - - - - - - -       has a past medical history of Allergy, Arthritis, Atrial flutter ([place]), Cataract, GERD (gastroesophageal reflux disease), H/O hiatal hernia, Hyperlipidemia, Injury of right hand, Pulmonary fibrosis ([place]), Renal stone (06/2014), and Ulcer.   reports that he quit smoking about 42 years ago. His smoking use included cigarettes. He has a 16.00 pack-year smoking history. He has never used smokeless tobacco.  Past Surgical History:  Procedure Laterality Date   ATRIAL FLUTTER ABLATION N/A 04/08/2015   Procedure: ATRIAL FLUTTER ABLATION;  Surgeon: Evans Lance, MD;  Location: [facility] CATH LAB;  Service: Cardiovascular;  Laterality: N/A;   CATARACT EXTRACTION, BILATERAL  2016   COLONOSCOPY  03/01/2017   per Dr. Loletha Carrow, clear, repeat in 5 yrs (brother had colon cancer)    ESOPHAGOGASTRODUODENOSCOPY  2007   [place] LITHOTRIPSY  06-17-14   per Dr. Jeffie Pollock    HAND SURGERY Right    LUNG BIOPSY Left 03/22/2013   Procedure: LUNG BIOPSY;  Surgeon: Melrose Nakayama, MD;  Location: [place];  Service: Thoracic;  Laterality: Left;   POLYPECTOMY     RIGHT HEART CATH N/A 02/21/2021   Procedure: RIGHT HEART CATH;  Surgeon: Jolaine Artist, MD;  Location: [place] CV LAB;  Service: Cardiovascular;  Laterality: N/A;   UPPER GASTROINTESTINAL ENDOSCOPY     VIDEO ASSISTED THORACOSCOPY Left 03/22/2013   Procedure: VIDEO ASSISTED THORACOSCOPY;  Surgeon: Melrose Nakayama, MD;  Location: [place];  Service: Thoracic;   Laterality: Left;    No Known Allergies  Immunization History  Administered Date(s) Administered   Fluad Quad(high Dose 65+) 08/25/2019   Influenza Split 09/13/2013   Influenza, High Dose Seasonal PF 10/06/2016, 09/16/2017, 08/17/2018   Influenza-Unspecified 09/07/2014, 09/14/2015, 09/13/2020   PFIZER(Purple Top)SARS-COV-2 Vaccination 02/12/2020, 03/12/2020, 08/14/2020   Pneumococcal Conjugate-13 02/16/2017   Pneumococcal Polysaccharide-23 05/26/2013   Td 12/15/2007   Tdap 02/23/2018   Zoster Recombinat (Shingrix) 10/13/2018, 07/25/2019   Zoster, Live 02/19/2014    Family History  Problem Relation Age of Onset   Liver cancer Mother    Diabetes Sister    Lung cancer Sister    Heart disease Brother    Diabetes Brother    Colon cancer Brother    Diabetes Other    Cancer Other        prostate   Colon polyps Neg Hx    Esophageal cancer Neg Hx    Rectal cancer Neg Hx    Stomach cancer Neg Hx      Current Outpatient Medications:    chlorpheniramine-HYDROcodone (TUSSIONEX PENNKINETIC ER) 10-8 MG/5ML SUER, Take 5 mLs by mouth 2 (two) times daily as needed for cough (PRN, cough, ipf, chronic cough, palliation)., Disp: 140 mL, Rfl: 0   diphenhydrAMINE (BENADRYL) 25 MG tablet, Take 25 mg by mouth at bedtime as needed for allergies or sleep., Disp: , Rfl:    fluticasone (FLONASE) 50 MCG/ACT nasal spray, SPRAY 2 SPRAYS INTO EACH NOSTRIL EVERY DAY, Disp: 48 mL, Rfl: 5   OFEV 150 MG CAPS, TAKE 1 CAPSULE BY MOUTH TWICE DAILY WITH FOOD. (Patient taking differently: Take 150 mg by mouth 2 (two) times daily.), Disp: 60 capsule, Rfl: 10   pantoprazole (PROTONIX) 40 MG tablet, TAKE 1 TABLET BY MOUTH TWICE A DAY, Disp: 180 tablet, Rfl: 0   predniSONE (DELTASONE) 5 MG tablet, TAKE 1 TABLET BY MOUTH EVERY DAY WITH  BREAKFAST, Disp: 30 tablet, Rfl: 3   zolpidem (AMBIEN) 5 MG tablet, TAKE 1 TABLET BY MOUTH EVERY DAY AT BEDTIME AS NEEDED FOR SLEEP (Patient taking differently: Take 5 mg by mouth at  bedtime as needed for sleep.), Disp: 30 tablet, Rfl: 5  Current Facility-Administered Medications:    chlorpheniramine-HYDROcodone (TUSSIONEX) 10-8 MG/5ML suspension 5 mL, 5 mL, Oral, Q12H PRN, Brand Males, MD      Objective:   Vitals:   06/13/21 1206  BP: 130/68  Pulse: 67  Temp: 98.3 F (36.8 C)  TempSrc: Oral  SpO2: 99%  Weight: 171 lb (77.6 kg)  Height: 5' 7.5" (1.715 m)    Estimated body mass index is 26.39 kg/m as calculated from the following:   Height as of this encounter: 5' 7.5" (1.715 m).   Weight as of this encounter: 171 lb (77.6 kg).  _0 @  Filed Weights   06/13/21 1206  Weight: 171 lb (77.6 kg)     Physical Exam    General: No distress. Looks well Neuro: Alert and Oriented x 3. GCS 15. Speech normal Psych: Pleasant Resp:  Barrel Chest - no.  Wheeze - no, Crackles - yes, No overt respiratory distress CVS: Normal heart sounds. Murmurs - no Ext: Stigmata of Connective Tissue Disease - no HEENT: Normal upper airway. PEERL +. No post nasal drip        Assessment:       ICD-10-CM   1. IPF (idiopathic pulmonary fibrosis) ([place])  J84.112     2. Chronic cough  R05.3     3. Insurance coverage problems  Z59.89     4. Other chronic back pain  M54.9    G89.29     5. Preop respiratory exam  Z01.811          Plan:     Patient Instructions  IPF (idiopathic pulmonary fibrosis) (HCC) Chronic cough  Slowly progressive but clinically stable since last visit. No flare up. No heart failure  Plan  - check cbc, bmet, lft, pt, ptt and bnp 06/13/2021  - continue ofev - continue prednisone 47m per day  - contnue tussionex - wil do refill today   Insurance coverage problems  - cma to have y our insurance form brought to me today from fax machine so you can get copay covered 06/13/2021   Other chronic back pain Preop respiratory exam  - d/w dR Nundkumar -0 ok for back surgery as long as no flare up between now and surgery and  clinically not worse. Recommend surgery in hospital setting  -refer Rob Balking [facility] acupuncture and dry needling. CAll them [phone]  Followup  - setp 2022 for face to face visit 30 min slot  ( Level 05 visit: Estb 40-54 min   in  visit type: on-site physical face to visit  in total care time and counseling or/and coordination of care by this undersigned MD - Dr MBrand Males This includes one or more of the following on this same day 06/13/2021: pre-charting, chart review, note writing, documentation discussion of test results, diagnostic or treatment recommendations, prognosis, risks and benefits of management options, instructions, education, compliance or risk-factor reduction. It excludes time spent by the [place] staff in the care of the patient. Actual time 40 min)   SIGNATURE    Dr. MBrand Males M.D., F.C.C.P,  Pulmonary and Critical Care Medicine Staff Physician, CShishmarefDirector - Interstitial Lung Disease  Program  Pulmonary [facility] at [employer], [place], [address]  Pager: [phone], If no answer or between  15:00h - 7:00h: call [phone] Telephone: [phone]  12:51 PM 06/13/2021

## 2021-06-13 NOTE — Patient Instructions (Addendum)
IPF (idiopathic pulmonary fibrosis) (HCC) Chronic cough  Slowly progressive but clinically stable since last visit. No flare up. No heart failure  Plan  - check cbc, bmet, lft, pt, ptt and bnp 06/13/2021  - continue ofev - continue prednisone 70m per day  - contnue tussionex - wil do refill today   Insurance coverage problems  - cma to have y our insurance form brought to me today from fax machine so you can get copay covered 06/13/2021   Other chronic back pain Preop respiratory exam  - d/w dR Nundkumar -0 ok for back surgery as long as no flare up between now and surgery and clinically not worse. Recommend surgery in hospital setting  -refer Rob Balking [facility] acupuncture and dry needling. CAll them ([phone]  Followup  - setp 2022 for face to face visit 30 min slot

## 2021-06-17 ENCOUNTER — Other Ambulatory Visit: Payer: Self-pay | Admitting: Neurosurgery

## 2021-06-18 ENCOUNTER — Telehealth: Payer: Self-pay | Admitting: Internal Medicine

## 2021-06-18 MED ORDER — HYDROCOD POLST-CPM POLST ER 10-8 MG/5ML PO SUER: 5.0000 mL | Freq: Two times a day (BID) | ORAL | 0 refills | Status: DC | PRN

## 2021-06-18 NOTE — Telephone Encounter (Signed)
[name] could you please order? Pt stated he talked it being refilled in his OV with you on Friday 06/13/21

## 2021-06-18 NOTE — Telephone Encounter (Signed)
Order has been pended.

## 2021-06-18 NOTE — Telephone Encounter (Signed)
done

## 2021-06-18 NOTE — Telephone Encounter (Signed)
   His labs from 06/13/2021 are normal.  Please let him know  Please refill his opioid and send it to me for fingerprint signature and I will take care of it today 06/18/2021   LABS    PULMONARY No results for input(s): PHART, PCO2ART, PO2ART, HCO3, TCO2, O2SAT in the last 168 hours.  Invalid input(s): PCO2, PO2  CBC Recent Labs  Lab 06/13/21 1310  HGB 15.0  HCT 44.3  WBC 10.6*  PLT 259.0    COAGULATION Recent Labs  Lab 06/13/21 1310  INR 1.0    CARDIAC  No results for input(s): TROPONINI in the last 168 hours. Recent Labs  Lab 06/13/21 1310  PROBNP 24.0     CHEMISTRY Recent Labs  Lab 06/13/21 1310  NA 135  135  K 4.5  4.5  CL 98  98  CO2 30  30  GLUCOSE 106*  106*  BUN 24*  24*  CREATININE 1.16  1.16  CALCIUM 9.0  9.0   Estimated Creatinine Clearance: 53.2 mL/min (by C-G formula based on SCr of 1.16 mg/dL).   LIVER Recent Labs  Lab 06/13/21 1310  AST 19  19  ALT 14  14  ALKPHOS 49  49  BILITOT 0.4  0.4  PROT 7.3  7.3  ALBUMIN 4.2  4.2  INR 1.0     INFECTIOUS No results for input(s): LATICACIDVEN, PROCALCITON in the last 168 hours.   ENDOCRINE CBG (last 3)  No results for input(s): GLUCAP in the last 72 hours.       IMAGING x48h  - image(s) personally visualized  -   highlighted in bold No results found.

## 2021-06-19 NOTE — Telephone Encounter (Signed)
ATC, left detailed message in accordance with DPR  that [name] has sent in the refill on the cough syrup and to call if anything else is needed and left callback number. Nothing further needed.

## 2021-06-20 ENCOUNTER — Other Ambulatory Visit: Payer: Self-pay | Admitting: Family Medicine

## 2021-06-23 NOTE — Pre-Procedure Instructions (Signed)
Surgical Instructions    Your procedure is scheduled on 06/26/2021.  Report to [facility] Main Entrance "A" at 0530 A.M., then check in with the Admitting office.  Call this number if you have problems the morning of surgery:  [phone]   If you have any questions prior to your surgery date call [phone]: Open Monday-Friday 8am-4pm    Remember:  Do not eat or drink after midnight the night before your surgery    Take these medicines the morning of surgery with A SIP OF WATER  acetaminophen (TYLENOL) 325 MG tablet If needed flecainide (TAMBOCOR) 50 MG tablet fluticasone (FLONASE) 50 MCG/ACT nasal spray if needed pantoprazole (PROTONIX) 40 MG tablet predniSONE (DELTASONE) 5 MG tablet   As of today, STOP taking any Aspirin (unless otherwise instructed by your surgeon) Aleve, Naproxen, Ibuprofen, Motrin, Advil, Goody's, BC's, all herbal medications, fish oil, and all vitamins.          Do not wear jewelry or makeup Do not wear lotions, powders, perfumes/colognes, or deodorant. Do not shave 48 hours prior to surgery.  Men may shave face and neck. Do not bring valuables to the hospital. DO Not wear nail polish, gel polish, artificial nails, or any other type of covering on  natural nails including finger and toenails. If patients have artificial nails, gel coating, etc. that need to be removed by a nail salon please have this removed prior to surgery or surgery may need to be canceled/delayed if the surgeon/ anesthesia feels like the patient is unable to be adequately monitored.             [facility] is not responsible for any belongings or valuables.  Do NOT Smoke (Tobacco/Vaping) or drink Alcohol 24 hours prior to your procedure If you use a CPAP at night, you may bring all equipment for your overnight stay.   Contacts, glasses, dentures or bridgework may not be worn into surgery, please bring cases for these belongings   For patients admitted to the hospital, discharge time  will be determined by your treatment team.   Patients discharged the day of surgery will not be allowed to drive home, and someone needs to stay with them for 24 hours.  ONLY 1 SUPPORT PERSON MAY BE PRESENT WHILE YOU ARE IN SURGERY. IF YOU ARE TO BE ADMITTED ONCE YOU ARE IN YOUR ROOM YOU WILL BE ALLOWED TWO (2) VISITORS.  Minor children may have two parents present. Special consideration for safety and communication needs will be reviewed on a case by case basis.  Special instructions:    Oral Hygiene is also important to reduce your risk of infection.  Remember - BRUSH YOUR TEETH THE MORNING OF SURGERY WITH YOUR REGULAR TOOTHPASTE   [facility]- Preparing For Surgery  Before surgery, you can play an important role. Because skin is not sterile, your skin needs to be as free of germs as possible. You can reduce the number of germs on your skin by washing with CHG (chlorahexidine gluconate) Soap before surgery.  CHG is an antiseptic cleaner which kills germs and bonds with the skin to continue killing germs even after washing.     Please do not use if you have an allergy to CHG or antibacterial soaps. If your skin becomes reddened/irritated stop using the CHG.  Do not shave (including legs and underarms) for at least 48 hours prior to first CHG shower. It is OK to shave your face.  Please follow these instructions carefully.  Shower the NIGHT BEFORE SURGERY and the MORNING OF SURGERY with CHG Soap.   If you chose to wash your hair, wash your hair first as usual with your normal shampoo. After you shampoo, rinse your hair and body thoroughly to remove the shampoo.  Then [employer] and genitals (private parts) with your normal soap and rinse thoroughly to remove soap.  After that Use CHG Soap as you would any other liquid soap. You can apply CHG directly to the skin and wash gently with a scrungie or a clean washcloth.   Apply the CHG Soap to your body ONLY FROM THE NECK DOWN.  Do not use on  open wounds or open sores. Avoid contact with your eyes, ears, mouth and genitals (private parts). Wash Face and genitals (private parts)  with your normal soap.   Wash thoroughly, paying special attention to the area where your surgery will be performed.  Thoroughly rinse your body with warm water from the neck down.  DO NOT shower/wash with your normal soap after using and rinsing off the CHG Soap.  Pat yourself dry with a CLEAN TOWEL.  Wear CLEAN PAJAMAS to bed the night before surgery  Place CLEAN SHEETS on your bed the night before your surgery  DO NOT SLEEP WITH PETS.   Day of Surgery:  Take a shower with CHG soap. Wear Clean/Comfortable clothing the morning of surgery Do not apply any deodorants/lotions.   Remember to brush your teeth WITH YOUR REGULAR TOOTHPASTE.   Please read over the following fact sheets that you were given.

## 2021-06-24 ENCOUNTER — Encounter (HOSPITAL_COMMUNITY)
Admission: RE | Admit: 2021-06-24 | Discharge: 2021-06-24 | Disposition: A | Discharge status: Home / Self Care | Payer: PPO | Source: Ambulatory Visit | Attending: Neurosurgery | Admitting: Neurosurgery

## 2021-06-24 ENCOUNTER — Other Ambulatory Visit: Payer: Self-pay

## 2021-06-24 ENCOUNTER — Encounter (HOSPITAL_COMMUNITY): Payer: Self-pay

## 2021-06-24 DIAGNOSIS — Z01812 Encounter for preprocedural laboratory examination: Secondary | ICD-10-CM | POA: Diagnosis not present

## 2021-06-24 DIAGNOSIS — U071 COVID-19: Secondary | ICD-10-CM | POA: Diagnosis not present

## 2021-06-24 HISTORY — DX: Cardiac arrhythmia, unspecified: I49.9

## 2021-06-24 HISTORY — DX: Personal history of urinary calculi: Z87.442

## 2021-06-24 LAB — SURGICAL PCR SCREEN
MRSA, PCR: NEGATIVE
Staphylococcus aureus: POSITIVE — AB

## 2021-06-24 LAB — SARS CORONAVIRUS 2 (TAT 6-24 HRS): SARS Coronavirus 2: POSITIVE — AB

## 2021-06-24 NOTE — Progress Notes (Signed)
Pt tested positive for Covid today in PAT. Myra Gianotti PA notified and [employer] sent to Dr. Kathyrn Sheriff.

## 2021-06-24 NOTE — Progress Notes (Signed)
PCP - Dr. Alysia Penna Cardiologist - Crissie Sickles MD  PPM/ICD - denies Device Orders -  Rep Notified -   Chest x-ray -06/13/21  EKG - 02/21/21 Stress Test - none ECHO - 02/05/21 Cardiac Cath - 02/21/21  Sleep Study - denies CPAP - none  Fasting Blood Sugar - n/a Checks Blood Sugar _____ times a day  Blood Thinner Instructions:n/a Aspirin Instructions:n/a  ERAS Protcol -no PRE-SURGERY Ensure or G2-   COVID TEST- 06/24/21   Anesthesia review: yes-home O2 with activities  Patient denies shortness of breath, fever, cough and chest pain at PAT appointment   All instructions explained to the patient, with a verbal understanding of the material. Patient agrees to go over the instructions while at home for a better understanding. Patient also instructed to self quarantine after being tested for COVID-19. The opportunity to ask questions was provided.

## 2021-06-25 ENCOUNTER — Other Ambulatory Visit: Payer: Self-pay | Admitting: Neurosurgery

## 2021-06-25 DIAGNOSIS — J8410 Pulmonary fibrosis, unspecified: Secondary | ICD-10-CM | POA: Diagnosis not present

## 2021-06-25 NOTE — Anesthesia Preprocedure Evaluation (Addendum)
Anesthesia Evaluation  Patient identified by MRN, date of birth, ID band Patient awake    Reviewed: Allergy & Precautions, NPO status , Patient's Chart, lab work & pertinent test results  Airway Mallampati: II  TM Distance: >3 FB Neck ROM: Full    Dental  (+) Partial Lower, Partial Upper   Pulmonary former smoker,    breath sounds clear to auscultation       Cardiovascular + dysrhythmias Atrial Fibrillation  Rhythm:Regular Rate:Normal  Echo:  1. Left ventricular ejection fraction, by estimation, is 60 to 65%. The  left ventricle has normal function. The left ventricle has no regional  wall motion abnormalities. There is mild concentric left ventricular  hypertrophy. Left ventricular diastolic  parameters were normal.  2. Right ventricular systolic function is mildly reduced. The right  ventricular size is mildly-to-moderately dilated. There is mildly elevated  pulmonary artery systolic pressure.  3. The mitral valve is normal in structure. Mild mitral valve  regurgitation.  4. The aortic valve is tricuspid. Aortic valve regurgitation is not  visualized.  5. The IVC is not well visualized.    Neuro/Psych negative psych ROS   GI/Hepatic Neg liver ROS, hiatal hernia, GERD  ,  Endo/Other  negative endocrine ROS  Renal/GU Renal InsufficiencyRenal disease     Musculoskeletal   Abdominal Normal abdominal exam  (+)   Peds  Hematology negative hematology ROS (+)   Anesthesia Other Findings   Reproductive/Obstetrics                           Anesthesia Physical Anesthesia Plan  ASA: 3  Anesthesia Plan: General   Post-op Pain Management:    Induction: Intravenous  PONV Risk Score and Plan: 3 and Ondansetron, Dexamethasone and Treatment may vary due to age or medical condition  Airway Management Planned: Oral ETT  Additional Equipment: None  Intra-op Plan:   Post-operative Plan:  Extubation in OR  Informed Consent: I have reviewed the patients History and Physical, chart, labs and discussed the procedure including the risks, benefits and alternatives for the proposed anesthesia with the patient or authorized representative who has indicated his/her understanding and acceptance.     Dental advisory given  Plan Discussed with: CRNA  Anesthesia Plan Comments: (See PAT note written 06/25/2021 by Myra Gianotti, PA-C. He has preoperative cardiology, pulmonology, and primary care input.  + COVID-19 test 06/24/21, but learned that he was actually treated by Dr. Guadalupe Dawn for COVID-19 with + test ~ 05/09/21.)      Anesthesia Quick Evaluation

## 2021-06-25 NOTE — Progress Notes (Signed)
Left message at Dr. Cleotilde Neer office about covid test result.

## 2021-06-25 NOTE — Progress Notes (Signed)
Anesthesia Chart Review:  Case: [id number] Date/Time: 06/26/21 0717   Procedure: MICRODISCECTOMY L4-L5 (Left)   Anesthesia type: General   Pre-op diagnosis: HERNIATED NUCLEUS PULPOSUS, LUMBAR   Location: [facility] OR ROOM 21 / [facility] OR   Surgeons: Consuella Lose, MD       DISCUSSION: Patient is a 75 year old male scheduled for the above procedure.  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   Medical clearance letter received from Dr. Sarajane Jews classifying patient and "Low Risk".   Last pulmonology evaluation by [name] on 06/13/21. Patient on Ofev for UIP since May 2015. Treated for COVID in May and breathing had been a little worse. IUP slowly progressive but overall felt clinically stable. Was using oxygen with exercise and when moving the yard. He notes patient initially scheduled for back surgery at out-patient surgery center, but it sounds like case was cancelled due to crackles. Patient was frustrated because crackles were attributed to CHF and not his known pulmonary fibrosis. His CXR was stable. [name] wrote, "ok for back surgery as long as no flare up between now and surgery and clinically not worse. Recommend surgery in hospital setting". He is also on prednisone 5 mg daily.   Cardiology input outlined on 06/06/21 by Coletta Memos, NP, "Given past medical history and time since last visit, based on ACC/AHA guidelines, Tanner Bishop would be at acceptable risk for the planned procedure without further cardiovascular testing.    His RCRI is a class I risk, 0.4% risk of major cardiac event.  He is able to complete greater than 4 METS of physical activity..."   06/24/21 presurgical COVID-19 test was positive; however, afterwards learned that he had a positive test in May 2022. Based on [facility]  communication with [name], he learned patient tested positive 05/09/21 and was called in [place]. Given positive test within 90 days without COVID type symptoms then suspect 06/25/11 positive test is a persistently elevated result from May.   Anesthesia team to evaluate on the day of surgery.    VS: BP 136/77   Pulse 65   Temp 36.6 C (Oral)   Resp 18   Ht 5' 9" (1.753 m)   Wt 77.9 kg   SpO2 96%   BMI 25.36 kg/m    PROVIDERS: [name], MD is PCP  - Crissie Sickles, MD is cardiologist. Last visit 01/22/21 with Tommye Standard, PA-C.  PAF stable on flecainide. He was having palpitations but EKG showed SR.  Echo showed normal LVEF, mild MR, mildly reduced RV systolic function. Event monitor showed SR with frequent PACs, rare PVCs. [place] with Dr. Haroldine Laws on 02/21/21 with no evidence of PAH. Brand Males, MD is pulmonologist   LABS: Labs reviewed: Acceptable for surgery. (all labs ordered are listed, but only abnormal results are displayed)  Labs Reviewed  SURGICAL PCR SCREEN - Abnormal; Notable for the following components:      Result Value   Staphylococcus aureus POSITIVE (*)    All other components within normal limits  SARS CORONAVIRUS 2 (TAT 6-24 HRS) - Abnormal; Notable for the following components:   SARS Coronavirus 2 POSITIVE (*)    All other components within normal limits    Spirometry 06/12/21: Results for Tanner, Bishop (MRN [id number]) as of 06/25/2021 10:06  Ref. Range 06/12/2021 13:58  FVC-Pre Latest Units: L 1.97  FVC-%Pred-Pre Latest Units: % 50  FEV1-Pre Latest Units: L 1.74  FEV1-%Pred-Pre Latest Units: % 62  Pre FEV1/FVC ratio Latest Units: % 89  FEV1FVC-%Pred-Pre Latest Units: % 121  FEF 25-75 Pre Latest Units: L/sec 2.68  FEF2575-%Pred-Pre Latest Units: % 131  FEV6-Pre Latest Units: L 1.97  FEV6-%Pred-Pre Latest Units: % 54  Pre FEV6/FVC Ratio Latest Units: % 100  FEV6FVC-%Pred-Pre Latest Units: % 106  DLCO cor Latest Units: ml/min/mmHg 13.83   DLCO cor % pred Latest Units: % 59  DLCO unc Latest Units: ml/min/mmHg 13.83  DLCO unc % pred Latest Units: % 59  DL/VA Latest Units: ml/min/mmHg/L 4.62  DL/VA % pred Latest Units: % 114    IMAGES:   EKG: 02/21/21:  Ventricular rate 57 bpm Sinus bradycardia with 1st degree A-V block Right bundle branch block Abnormal ECG No significant change since last tracing Confirmed by Ena Dawley [phone]) on 02/21/2021 12:39:59 PM   CV: [place] 02/21/21: Findings:   RA = 3 RV = 35/5 PA = 32/8 (17) PCW = 6 Fick cardiac output/index = 4.9/2.6 PVR = 2.1 WU Ao sat = 99% PA sat = 72%, 73% High SVC sat = 72%   Assessment:   1. Normal hemodynamics. No evidence of PAH    Long term event monitor 01/25/21-02/05/21: Study Highlights 1. NSR with sinus brady and sinus tachycardia 2. Frequent PAC's and rare PVC's 3. No atrial fib 4. NS SVT and SVT 5. No VT   Echo 02/05/21: IMPRESSIONS   1. Left ventricular ejection fraction, by estimation, is 60 to 65%. The  left ventricle has normal function. The left ventricle has no regional  wall motion abnormalities. There is mild concentric left ventricular  hypertrophy. Left ventricular diastolic  parameters were normal.   2. Right ventricular systolic function is mildly reduced. The right  ventricular size is mildly-to-moderately dilated. There is mildly elevated  pulmonary artery systolic pressure.   3. The mitral valve is normal in structure. Mild mitral valve  regurgitation.   4. The aortic valve is tricuspid. Aortic valve regurgitation is not  visualized.   5. The IVC is not well visualized.  Comparison(s): No prior Echocardiogram.   Past Medical History:  Diagnosis Date   Allergy    Arthritis    Atrial flutter ([place])    a. s/p ablation 03/2015 Dr Lovena Le   Cataract    removed both eyes   Dysrhythmia    GERD (gastroesophageal reflux disease)    H/O hiatal hernia    History of kidney stones    Hyperlipidemia    Injury of right  hand    permanent damage after workplace 2009 and 2010 Dr [facility]   Pulmonary fibrosis [facility])    sees Dr. Onnie Graham    Renal stone 06/2014   Ulcer    unresolved    Past Surgical History:  Procedure Laterality Date   ATRIAL FLUTTER ABLATION N/A 04/08/2015   Procedure: ATRIAL FLUTTER ABLATION;  Surgeon: Evans Lance, MD;  [facility] C[facility] LAB;  Service: Cardiovascular;  Laterality: N/A;   CATARACT EXTRACTION, BILATERAL  2016   COLONOSCOPY  03/01/2017   per Dr. Loletha Carrow, clear, repeat in 5 yrs (brother had colon cancer)    ESOPHAGOGASTRODUODENOSCOPY  2007   EXTRACORPOREAL SHOCK WAVE LITHOTRIPSY  06/17/2014   per Dr. Jeffie Pollock    HAND SURGERY Right    LUNG BIOPSY Left 03/22/2013   Procedure: LUNG BIOPSY;  Surgeon: Melrose Nakayama, MD;  Location: [place];  Service:  Thoracic;  Laterality: Left;   POLYPECTOMY     RIGHT HEART CATH N/A 02/21/2021   Procedure: RIGHT HEART CATH;  Surgeon: Jolaine Artist, MD;  Location: [facility] CV LAB;  Service: Cardiovascular;  Laterality: N/A;   TONSILLECTOMY     UPPER GASTROINTESTINAL ENDOSCOPY     VIDEO ASSISTED THORACOSCOPY Left 03/22/2013   Procedure: VIDEO ASSISTED THORACOSCOPY;  Surgeon: Melrose Nakayama, MD;  Location: [facility] OR;  Service: Thoracic;  Laterality: Left;    MEDICATIONS:  acetaminophen (TYLENOL) 325 MG tablet   chlorpheniramine-HYDROcodone (TUSSIONEX PENNKINETIC ER) 10-8 MG/5ML SUER   flecainide (TAMBOCOR) 50 MG tablet   fluticasone (FLONASE) 50 MCG/ACT nasal spray   OFEV 150 MG CAPS   pantoprazole (PROTONIX) 40 MG tablet   predniSONE (DELTASONE) 5 MG tablet   zolpidem (AMBIEN) 5 MG tablet    chlorpheniramine-HYDROcodone (TUSSIONEX) 10-8 MG/5ML suspension 5 mL    Myra Gianotti, PA-C Surgical Short Stay/Anesthesiology [facility] Phone [phone] [facility] Phone [phone] 06/25/2021 11:57 AM

## 2021-06-26 ENCOUNTER — Encounter (HOSPITAL_COMMUNITY): Payer: Self-pay | Admitting: Neurosurgery

## 2021-06-26 ENCOUNTER — Ambulatory Visit (HOSPITAL_COMMUNITY): Payer: PPO | Admitting: Certified Registered"

## 2021-06-26 ENCOUNTER — Ambulatory Visit (HOSPITAL_COMMUNITY): Payer: PPO

## 2021-06-26 ENCOUNTER — Ambulatory Visit (HOSPITAL_COMMUNITY)
Admission: RE | Admit: 2021-06-26 | Discharge: 2021-06-26 | Disposition: A | Discharge status: Home / Self Care | Payer: PPO | Attending: Neurosurgery | Admitting: Neurosurgery

## 2021-06-26 ENCOUNTER — Other Ambulatory Visit: Payer: Self-pay

## 2021-06-26 ENCOUNTER — Ambulatory Visit (HOSPITAL_COMMUNITY): Payer: PPO | Admitting: Vascular Surgery

## 2021-06-26 ENCOUNTER — Encounter (HOSPITAL_COMMUNITY)
Admission: RE | Disposition: A | Discharge status: Home / Self Care | Payer: Self-pay | Source: Home / Self Care | Attending: Neurosurgery

## 2021-06-26 DIAGNOSIS — Z87891 Personal history of nicotine dependence: Secondary | ICD-10-CM | POA: Diagnosis not present

## 2021-06-26 DIAGNOSIS — M5116 Intervertebral disc disorders with radiculopathy, lumbar region: Secondary | ICD-10-CM | POA: Insufficient documentation

## 2021-06-26 DIAGNOSIS — Z7952 Long term (current) use of systemic steroids: Secondary | ICD-10-CM | POA: Diagnosis not present

## 2021-06-26 DIAGNOSIS — Z9889 Other specified postprocedural states: Secondary | ICD-10-CM | POA: Diagnosis not present

## 2021-06-26 DIAGNOSIS — Z79899 Other long term (current) drug therapy: Secondary | ICD-10-CM | POA: Diagnosis not present

## 2021-06-26 DIAGNOSIS — M5126 Other intervertebral disc displacement, lumbar region: Secondary | ICD-10-CM | POA: Diagnosis not present

## 2021-06-26 DIAGNOSIS — Z419 Encounter for procedure for purposes other than remedying health state, unspecified: Secondary | ICD-10-CM

## 2021-06-26 DIAGNOSIS — I480 Paroxysmal atrial fibrillation: Secondary | ICD-10-CM | POA: Diagnosis not present

## 2021-06-26 DIAGNOSIS — K219 Gastro-esophageal reflux disease without esophagitis: Secondary | ICD-10-CM | POA: Diagnosis not present

## 2021-06-26 DIAGNOSIS — E785 Hyperlipidemia, unspecified: Secondary | ICD-10-CM | POA: Diagnosis not present

## 2021-06-26 HISTORY — PX: LUMBAR LAMINECTOMY/DECOMPRESSION MICRODISCECTOMY: SHX5026

## 2021-06-26 SURGERY — LUMBAR LAMINECTOMY/DECOMPRESSION MICRODISCECTOMY 1 LEVEL
Anesthesia: General | Site: Spine Lumbar | Laterality: Left

## 2021-06-26 MED ORDER — ROCURONIUM BROMIDE 10 MG/ML (PF) SYRINGE
PREFILLED_SYRINGE | INTRAVENOUS | Status: DC | PRN
  Administered 2021-06-26: 50 mg via INTRAVENOUS

## 2021-06-26 MED ORDER — HYDROCODONE-ACETAMINOPHEN 5-325 MG PO TABS: 1.0000 | ORAL_TABLET | Freq: Four times a day (QID) | ORAL | 0 refills | Status: AC | PRN

## 2021-06-26 MED ORDER — FENTANYL CITRATE (PF) 100 MCG/2ML IJ SOLN
INTRAMUSCULAR | Status: DC | PRN
  Administered 2021-06-26 (×2): 50 ug via INTRAVENOUS

## 2021-06-26 MED ORDER — CHLORHEXIDINE GLUCONATE CLOTH 2 % EX PADS: 6.0000 | MEDICATED_PAD | Freq: Once | CUTANEOUS | Status: DC

## 2021-06-26 MED ORDER — GLYCOPYRROLATE PF 0.2 MG/ML IJ SOSY
PREFILLED_SYRINGE | INTRAMUSCULAR | Status: AC
  Filled 2021-06-26: qty 1

## 2021-06-26 MED ORDER — CHLORHEXIDINE GLUCONATE 0.12 % MT SOLN
OROMUCOSAL | Status: AC
  Filled 2021-06-26: qty 15

## 2021-06-26 MED ORDER — THROMBIN 5000 UNITS EX SOLR
CUTANEOUS | Status: AC
  Filled 2021-06-26: qty 15000

## 2021-06-26 MED ORDER — ONDANSETRON HCL 4 MG/2ML IJ SOLN
INTRAMUSCULAR | Status: DC | PRN
  Administered 2021-06-26: 4 mg via INTRAVENOUS

## 2021-06-26 MED ORDER — LIDOCAINE-EPINEPHRINE 1 %-1:100000 IJ SOLN
INTRAMUSCULAR | Status: AC
  Filled 2021-06-26: qty 1

## 2021-06-26 MED ORDER — LACTATED RINGERS IV SOLN: INTRAVENOUS | Status: DC

## 2021-06-26 MED ORDER — HYDROCODONE-ACETAMINOPHEN 5-325 MG PO TABS
ORAL_TABLET | ORAL | Status: AC
  Filled 2021-06-26: qty 1

## 2021-06-26 MED ORDER — THROMBIN 5000 UNITS EX SOLR
OROMUCOSAL | Status: DC | PRN
  Administered 2021-06-26: 5 mL via TOPICAL

## 2021-06-26 MED ORDER — PHENYLEPHRINE 40 MCG/ML (10ML) SYRINGE FOR IV PUSH (FOR BLOOD PRESSURE SUPPORT)
PREFILLED_SYRINGE | INTRAVENOUS | Status: DC | PRN
  Administered 2021-06-26: 80 ug via INTRAVENOUS

## 2021-06-26 MED ORDER — BUPIVACAINE HCL (PF) 0.5 % IJ SOLN
INTRAMUSCULAR | Status: AC
  Filled 2021-06-26: qty 30

## 2021-06-26 MED ORDER — DEXAMETHASONE SODIUM PHOSPHATE 10 MG/ML IJ SOLN
INTRAMUSCULAR | Status: DC | PRN
  Administered 2021-06-26: 10 mg via INTRAVENOUS

## 2021-06-26 MED ORDER — ACETAMINOPHEN 160 MG/5ML PO SOLN: 325.0000 mg | Freq: Once | ORAL | Status: DC | PRN

## 2021-06-26 MED ORDER — CEFAZOLIN SODIUM-DEXTROSE 2-4 GM/100ML-% IV SOLN
INTRAVENOUS | Status: AC
  Filled 2021-06-26: qty 100

## 2021-06-26 MED ORDER — PHENYLEPHRINE 40 MCG/ML (10ML) SYRINGE FOR IV PUSH (FOR BLOOD PRESSURE SUPPORT)
PREFILLED_SYRINGE | INTRAVENOUS | Status: AC
  Filled 2021-06-26: qty 10

## 2021-06-26 MED ORDER — ACETAMINOPHEN 325 MG PO TABS: 325.0000 mg | ORAL_TABLET | Freq: Once | ORAL | Status: DC | PRN

## 2021-06-26 MED ORDER — HYDROMORPHONE HCL 1 MG/ML IJ SOLN: 0.2500 mg | INTRAMUSCULAR | Status: DC | PRN

## 2021-06-26 MED ORDER — PREDNISONE 5 MG PO TABS: 5.0000 mg | ORAL_TABLET | Freq: Every day | ORAL | Status: DC

## 2021-06-26 MED ORDER — ZOLPIDEM TARTRATE 5 MG PO TABS: 5.0000 mg | ORAL_TABLET | Freq: Every evening | ORAL | Status: DC | PRN

## 2021-06-26 MED ORDER — METHYLPREDNISOLONE ACETATE 80 MG/ML IJ SUSP
INTRAMUSCULAR | Status: DC | PRN
  Administered 2021-06-26: 80 mg

## 2021-06-26 MED ORDER — CEFAZOLIN SODIUM-DEXTROSE 2-4 GM/100ML-% IV SOLN
2.0000 g | INTRAVENOUS | Status: AC
  Administered 2021-06-26: 2 g via INTRAVENOUS

## 2021-06-26 MED ORDER — 0.9 % SODIUM CHLORIDE (POUR BTL) OPTIME
TOPICAL | Status: DC | PRN
  Administered 2021-06-26: 1000 mL

## 2021-06-26 MED ORDER — ORAL CARE MOUTH RINSE: 15.0000 mL | Freq: Once | OROMUCOSAL | Status: AC

## 2021-06-26 MED ORDER — LIDOCAINE HCL (PF) 2 % IJ SOLN
INTRAMUSCULAR | Status: DC | PRN
  Administered 2021-06-26: 60 mg via INTRADERMAL

## 2021-06-26 MED ORDER — HYDROCODONE-ACETAMINOPHEN 5-325 MG PO TABS
1.0000 | ORAL_TABLET | Freq: Once | ORAL | Status: AC
  Administered 2021-06-26: 1 via ORAL

## 2021-06-26 MED ORDER — CHLORHEXIDINE GLUCONATE 0.12 % MT SOLN
15.0000 mL | Freq: Once | OROMUCOSAL | Status: AC
  Administered 2021-06-26: 15 mL via OROMUCOSAL

## 2021-06-26 MED ORDER — ACETAMINOPHEN 10 MG/ML IV SOLN: 1000.0000 mg | Freq: Once | INTRAVENOUS | Status: DC | PRN

## 2021-06-26 MED ORDER — LIDOCAINE 2% (20 MG/ML) 5 ML SYRINGE
INTRAMUSCULAR | Status: AC
  Filled 2021-06-26: qty 5

## 2021-06-26 MED ORDER — MEPERIDINE HCL 25 MG/ML IJ SOLN: 6.2500 mg | INTRAMUSCULAR | Status: DC | PRN

## 2021-06-26 MED ORDER — ONDANSETRON HCL 4 MG/2ML IJ SOLN
INTRAMUSCULAR | Status: AC
  Filled 2021-06-26: qty 2

## 2021-06-26 MED ORDER — CYCLOBENZAPRINE HCL 10 MG PO TABS: 10.0000 mg | ORAL_TABLET | Freq: Three times a day (TID) | ORAL | 0 refills | Status: DC | PRN

## 2021-06-26 MED ORDER — METHYLPREDNISOLONE ACETATE 80 MG/ML IJ SUSP
INTRAMUSCULAR | Status: AC
  Filled 2021-06-26: qty 1

## 2021-06-26 MED ORDER — PROMETHAZINE HCL 25 MG/ML IJ SOLN: 6.2500 mg | INTRAMUSCULAR | Status: DC | PRN

## 2021-06-26 MED ORDER — PROPOFOL 10 MG/ML IV BOLUS
INTRAVENOUS | Status: AC
  Filled 2021-06-26: qty 20

## 2021-06-26 MED ORDER — PROPOFOL 10 MG/ML IV BOLUS
INTRAVENOUS | Status: DC | PRN
  Administered 2021-06-26: 150 mg via INTRAVENOUS

## 2021-06-26 MED ORDER — ROCURONIUM BROMIDE 10 MG/ML (PF) SYRINGE
PREFILLED_SYRINGE | INTRAVENOUS | Status: AC
  Filled 2021-06-26: qty 10

## 2021-06-26 MED ORDER — FENTANYL CITRATE (PF) 250 MCG/5ML IJ SOLN
INTRAMUSCULAR | Status: AC
  Filled 2021-06-26: qty 5

## 2021-06-26 MED ORDER — FLUTICASONE PROPIONATE 50 MCG/ACT NA SUSP: 2.0000 | Freq: Every day | NASAL | Status: AC | PRN

## 2021-06-26 MED ORDER — DEXAMETHASONE SODIUM PHOSPHATE 10 MG/ML IJ SOLN
INTRAMUSCULAR | Status: AC
  Filled 2021-06-26: qty 1

## 2021-06-26 MED ORDER — BUPIVACAINE HCL (PF) 0.5 % IJ SOLN
INTRAMUSCULAR | Status: DC | PRN
  Administered 2021-06-26: 5 mL

## 2021-06-26 MED ORDER — LIDOCAINE-EPINEPHRINE 1 %-1:100000 IJ SOLN
INTRAMUSCULAR | Status: DC | PRN
  Administered 2021-06-26: 5 mL

## 2021-06-26 MED ORDER — OFEV 150 MG PO CAPS: 150.0000 mg | ORAL_CAPSULE | Freq: Two times a day (BID) | ORAL | Status: DC

## 2021-06-26 MED ORDER — SUGAMMADEX SODIUM 200 MG/2ML IV SOLN
INTRAVENOUS | Status: DC | PRN
  Administered 2021-06-26: 150 mg via INTRAVENOUS

## 2021-06-26 MED ORDER — AMISULPRIDE (ANTIEMETIC) 5 MG/2ML IV SOLN: 10.0000 mg | Freq: Once | INTRAVENOUS | Status: DC | PRN

## 2021-06-26 SURGICAL SUPPLY — 59 items
ADH SKN CLS APL DERMABOND .7 (GAUZE/BANDAGES/DRESSINGS) ×1
APL SKNCLS STERI-STRIP NONHPOA (GAUZE/BANDAGES/DRESSINGS)
BAG COUNTER SPONGE SURGICOUNT (BAG) ×2 IMPLANT
BAG SPNG CNTER NS LX DISP (BAG) ×1
BAND INSRT 18 STRL LF DISP RB (MISCELLANEOUS) ×2
BAND RUBBER #18 3X1/16 STRL (MISCELLANEOUS) ×4 IMPLANT
BENZOIN TINCTURE PRP APPL 2/3 (GAUZE/BANDAGES/DRESSINGS) IMPLANT
BLADE CLIPPER SURG (BLADE) IMPLANT
BLADE SURG 11 STRL SS (BLADE) ×2 IMPLANT
BUR MATCHSTICK NEURO 3.0 LAGG (BURR) ×1 IMPLANT
BUR PRECISION FLUTE 5.0 (BURR) IMPLANT
CANISTER SUCT 3000ML PPV (MISCELLANEOUS) ×2 IMPLANT
CARTRIDGE OIL MAESTRO DRILL (MISCELLANEOUS) ×1 IMPLANT
DECANTER SPIKE VIAL GLASS SM (MISCELLANEOUS) ×2 IMPLANT
DERMABOND ADVANCED (GAUZE/BANDAGES/DRESSINGS) ×1
DERMABOND ADVANCED .7 DNX12 (GAUZE/BANDAGES/DRESSINGS) ×1 IMPLANT
DIFFUSER DRILL AIR PNEUMATIC (MISCELLANEOUS) ×2 IMPLANT
DRAPE LAPAROTOMY 100X72X124 (DRAPES) ×2 IMPLANT
DRAPE MICROSCOPE LEICA (MISCELLANEOUS) ×2 IMPLANT
DRAPE SURG 17X23 STRL (DRAPES) ×2 IMPLANT
DRSG OPSITE POSTOP 3X4 (GAUZE/BANDAGES/DRESSINGS) ×2 IMPLANT
DURAPREP 26ML APPLICATOR (WOUND CARE) ×2 IMPLANT
ELECT REM PT RETURN 9FT ADLT (ELECTROSURGICAL) ×2
ELECTRODE REM PT RTRN 9FT ADLT (ELECTROSURGICAL) ×1 IMPLANT
GAUZE 4X4 16PLY ~~LOC~~+RFID DBL (SPONGE) ×1 IMPLANT
GAUZE SPONGE 4X4 12PLY STRL (GAUZE/BANDAGES/DRESSINGS) IMPLANT
GLOVE EXAM NITRILE XL STR (GLOVE) IMPLANT
GLOVE SURG ENC MOIS LTX SZ7.5 (GLOVE) IMPLANT
GLOVE SURG LTX SZ7 (GLOVE) ×2 IMPLANT
GLOVE SURG UNDER POLY LF SZ7 (GLOVE) ×3 IMPLANT
GLOVE SURG UNDER POLY LF SZ7.5 (GLOVE) ×4 IMPLANT
GOWN STRL REUS W/ TWL LRG LVL3 (GOWN DISPOSABLE) ×2 IMPLANT
GOWN STRL REUS W/ TWL XL LVL3 (GOWN DISPOSABLE) IMPLANT
GOWN STRL REUS W/TWL 2XL LVL3 (GOWN DISPOSABLE) IMPLANT
GOWN STRL REUS W/TWL LRG LVL3 (GOWN DISPOSABLE) ×2
GOWN STRL REUS W/TWL XL LVL3 (GOWN DISPOSABLE) ×4
HEMOSTAT POWDER KIT SURGIFOAM (HEMOSTASIS) ×2 IMPLANT
KIT BASIN OR (CUSTOM PROCEDURE TRAY) ×2 IMPLANT
KIT TURNOVER KIT B (KITS) ×2 IMPLANT
NDL HYPO 18GX1.5 BLUNT FILL (NEEDLE) IMPLANT
NDL SPNL 18GX3.5 QUINCKE PK (NEEDLE) IMPLANT
NEEDLE HYPO 18GX1.5 BLUNT FILL (NEEDLE) IMPLANT
NEEDLE HYPO 22GX1.5 SAFETY (NEEDLE) ×2 IMPLANT
NEEDLE SPNL 18GX3.5 QUINCKE PK (NEEDLE) ×2 IMPLANT
NS IRRIG 1000ML POUR BTL (IV SOLUTION) ×2 IMPLANT
OIL CARTRIDGE MAESTRO DRILL (MISCELLANEOUS) ×2
PACK LAMINECTOMY NEURO (CUSTOM PROCEDURE TRAY) ×2 IMPLANT
PAD ARMBOARD 7.5X6 YLW CONV (MISCELLANEOUS) ×6 IMPLANT
SPONGE SURGIFOAM ABS GEL SZ50 (HEMOSTASIS) ×2 IMPLANT
SPONGE T-LAP 4X18 ~~LOC~~+RFID (SPONGE) ×1 IMPLANT
STRIP CLOSURE SKIN 1/2X4 (GAUZE/BANDAGES/DRESSINGS) IMPLANT
SUT VIC AB 0 CT1 18XCR BRD8 (SUTURE) ×1 IMPLANT
SUT VIC AB 0 CT1 8-18 (SUTURE) ×2
SUT VIC AB 2-0 CT1 18 (SUTURE) IMPLANT
SUT VICRYL 3-0 RB1 18 ABS (SUTURE) ×4 IMPLANT
SYR 3ML LL SCALE MARK (SYRINGE) IMPLANT
TOWEL GREEN STERILE (TOWEL DISPOSABLE) ×2 IMPLANT
TOWEL GREEN STERILE FF (TOWEL DISPOSABLE) ×2 IMPLANT
WATER STERILE IRR 1000ML POUR (IV SOLUTION) ×2 IMPLANT

## 2021-06-26 NOTE — Discharge Summary (Signed)
Physician Discharge Summary  Patient ID: Tanner Bishop MRN: [id number] DOB/AGE: [birth year] 75 y.o.  Admit date: 06/26/2021 Discharge date: 06/26/2021  Admission Diagnoses: Lumbar disc herniation with radiculopathy, L4-5  Discharge Diagnoses: Same Active Problems:   * No active hospital problems. *   Discharged Condition: Stable  Hospital Course:  [name] is a 75 y.o. male who presented to the clinic with left L5 radiculopathy and MRI demonstrating left L4-5 disc herniation. The patient underwent elective Left L4-5 laminotomy and microdiscectomy which was done without complication. Postoperatively the patient was at neurologic baseline. Back pain was controlled with oral medication, he  was ambulating without difficulty, voiding normally, and tolerating diet.  Treatments: Surgery - left L4-5  laminotomy, microdiscectomy  Discharge Exam: Blood pressure (!) 145/84, pulse 80, temperature 97.6 F (36.4 C), temperature source Oral, resp. rate 18, SpO2 94 %. Awake, alert, oriented Speech fluent, appropriate CN grossly intact 5/5 BUE/BLE Wound c/d/i  Follow-up: Follow-up in my office [facility] [phone]) in 2-3 weeks  Disposition: Discharge disposition: 01-Home or Self Care       Discharge Instructions     Call MD for:  redness, tenderness, or signs of infection (pain, swelling, redness, odor or green/yellow discharge around incision site)   Complete by: As directed    Call MD for:  temperature >100.4   Complete by: As directed    Diet - low sodium heart healthy   Complete by: As directed    Discharge instructions   Complete by: As directed    Walk at home as much as possible, at least 4 times / day   Increase activity slowly   Complete by: As directed    Lifting restrictions   Complete by: As directed    No lifting > 10 lbs   May shower / Bathe   Complete by: As directed    48 hours after surgery   May walk up steps   Complete  by: As directed    Other Restrictions   Complete by: As directed    No bending/twisting at waist   Remove dressing in 48 hours   Complete by: As directed       Allergies as of 06/26/2021   No Known Allergies      Medication List     TAKE these medications    acetaminophen 325 MG tablet Commonly known as: TYLENOL Take 650 mg by mouth every 6 (six) hours as needed.   chlorpheniramine-HYDROcodone 10-8 MG/5ML Suer Commonly known as: Tussionex Pennkinetic ER Take 5 mLs by mouth 2 (two) times daily as needed for cough (PRN, cough, ipf, chronic cough, palliation).   cyclobenzaprine 10 MG tablet Commonly known as: FLEXERIL Take 1 tablet (10 mg total) by mouth 3 (three) times daily as needed for muscle spasms.   flecainide 50 MG tablet Commonly known as: TAMBOCOR Take 50 mg by mouth 3 (three) times daily.   fluticasone 50 MCG/ACT nasal spray Commonly known as: FLONASE Place 2 sprays into both nostrils daily as needed for allergies.   HYDROcodone-acetaminophen 5-325 MG tablet Commonly known as: Norco Take 1-2 tablets by mouth every 6 (six) hours as needed for up to 7 days for moderate pain.   Ofev 150 MG Caps Generic drug: Nintedanib Take 1 capsule (150 mg total) by mouth 2 (two) times daily. What changed: See the new instructions.   pantoprazole 40 MG tablet Commonly known as: PROTONIX TAKE 1 TABLET BY MOUTH TWICE A DAY   predniSONE 5 MG  tablet Commonly known as: DELTASONE Take 1 tablet (5 mg total) by mouth daily with breakfast. What changed: See the new instructions.   zolpidem 5 MG tablet Commonly known as: AMBIEN Take 1 tablet (5 mg total) by mouth at bedtime as needed for sleep. What changed: See the new instructions.        Follow-up Information     Consuella Lose, MD Follow up in 3 week(s).   Specialty: Neurosurgery Why: For wound re-check, Clinical Follow-up Contact information: [address] [place]  [address] [phone]                 Signed: Jairo Ben 06/26/2021, 9:29 AM

## 2021-06-26 NOTE — Anesthesia Procedure Notes (Signed)
Procedure Name: Intubation Date/Time: 06/26/2021 7:50 AM Performed by: Barrington Ellison, CRNA Pre-anesthesia Checklist: Patient identified, Emergency Drugs available, Suction available and Patient being monitored Patient Re-evaluated:Patient Re-evaluated prior to induction Oxygen Delivery Method: Circle System Utilized Preoxygenation: Pre-oxygenation with 100% oxygen Induction Type: IV induction Ventilation: Mask ventilation without difficulty Laryngoscope Size: Mac and 3 Grade View: Grade I Tube type: Oral Tube size: 7.5 mm Number of attempts: 1 Airway Equipment and Method: Stylet Placement Confirmation: ETT inserted through vocal cords under direct vision, positive ETCO2 and breath sounds checked- equal and bilateral Secured at: 23 cm Tube secured with: Tape Dental Injury: Teeth and Oropharynx as per pre-operative assessment  Comments: Intubation done by Victorino Sparrow

## 2021-06-26 NOTE — H&P (Signed)
Chief Complaint   Back and leg pain  History of Present Illness  Tanner Bishop is a 75 y.o. male followed in the outpatient neurosurgery clinic with relatively chronic left-sided back and leg pain.  He describes radiation into the left thigh, calf, and foot.  This is interfered with his normal ADLs.  He has attempted multiple different conservative treatments including epidural steroid injection without significant improvement.  He therefore elected to proceed with surgical decompression.  Patient does have a history of pulmonary fibrosis and is seen by pulmonology.  He has recently been reevaluated and his pulmonary fibrosis appears to be well managed and relatively stable.  Past Medical History   Past Medical History:  Diagnosis Date   Allergy    Arthritis    Atrial flutter [facility])    a. s/p ablation 03/2015 Dr Lovena Le   Cataract    removed both eyes   Dysrhythmia    GERD (gastroesophageal reflux disease)    H/O hiatal hernia    History of kidney stones    Hyperlipidemia    Injury of right hand    permanent damage after workplace 2009 and 2010 Dr [facility]   Pulmonary fibrosis [facility])    sees Dr. Onnie Graham    Renal stone 06/2014   Ulcer    unresolved    Past Surgical History   Past Surgical History:  Procedure Laterality Date   ATRIAL FLUTTER ABLATION N/A 04/08/2015   Procedure: ATRIAL FLUTTER ABLATION;  Surgeon: Evans Lance, MD;  Location: [facility] CATH LAB;  Service: Cardiovascular;  Laterality: N/A;   CATARACT EXTRACTION, BILATERAL  2016   COLONOSCOPY  03/01/2017   per Dr. Loletha Carrow, clear, repeat in 5 yrs (brother had colon cancer)    ESOPHAGOGASTRODUODENOSCOPY  2007   EXTRACORPOREAL SHOCK WAVE LITHOTRIPSY  06/17/2014   per Dr. Jeffie Pollock    HAND SURGERY Right    LUNG BIOPSY Left 03/22/2013   Procedure: LUNG BIOPSY;  Surgeon: Melrose Nakayama, MD;  Location: [place];  Service: Thoracic;  Laterality: Left;   POLYPECTOMY     RIGHT HEART CATH N/A 02/21/2021    Procedure: RIGHT HEART CATH;  Surgeon: Jolaine Artist, MD;  Location: [place] CV LAB;  Service: Cardiovascular;  Laterality: N/A;   TONSILLECTOMY     UPPER GASTROINTESTINAL ENDOSCOPY     VIDEO ASSISTED THORACOSCOPY Left 03/22/2013   Procedure: VIDEO ASSISTED THORACOSCOPY;  Surgeon: Melrose Nakayama, MD;  Location: [facility] OR;  Service: Thoracic;  Laterality: Left;    Social History   Social History   Tobacco Use   Smoking status: Former    Packs/day: 1.00    Years: 16.00    Pack years: 16.00    Types: Cigarettes    Quit date: 12/14/1978    Years since quitting: 42.5   Smokeless tobacco: Never  Vaping Use   Vaping Use: Never used  Substance Use Topics   Alcohol use: No    Alcohol/week: 0.0 standard drinks   Drug use: No    Medications   Prior to Admission medications   Medication Sig Start Date End Date Taking? Authorizing Provider  acetaminophen (TYLENOL) 325 MG tablet Take 650 mg by mouth every 6 (six) hours as needed.   Yes [provider]  chlorpheniramine-HYDROcodone (TUSSIONEX PENNKINETIC ER) 10-8 MG/5ML SUER Take 5 mLs by mouth 2 (two) times daily as needed for cough (PRN, cough, ipf, chronic cough, palliation). 06/18/21  Yes Brand Males, MD  flecainide (TAMBOCOR) 50 MG tablet Take 50  mg by mouth 3 (three) times daily. 06/16/21  Yes [provider]  fluticasone (FLONASE) 50 MCG/ACT nasal spray SPRAY 2 SPRAYS INTO EACH NOSTRIL EVERY DAY Patient taking differently: Place 2 sprays into both nostrils daily as needed for allergies. 05/19/21  Yes Brand Males, MD  OFEV 150 MG CAPS TAKE 1 CAPSULE BY MOUTH TWICE DAILY WITH FOOD. Patient taking differently: Take 150 mg by mouth 2 (two) times daily. 11/20/20  Yes Brand Males, MD  pantoprazole (PROTONIX) 40 MG tablet TAKE 1 TABLET BY MOUTH TWICE A DAY 06/20/21  Yes [name], MD  predniSONE (DELTASONE) 5 MG tablet TAKE 1 TABLET BY MOUTH EVERY DAY WITH BREAKFAST Patient taking differently:  Take 5 mg by mouth daily with breakfast. 05/14/21  Yes Brand Males, MD  zolpidem (AMBIEN) 5 MG tablet TAKE 1 TABLET BY MOUTH EVERY DAY AT BEDTIME AS NEEDED FOR SLEEP Patient taking differently: Take 5 mg by mouth at bedtime as needed for sleep. 12/11/20  Yes [name], MD    Allergies  No Known Allergies  Review of Systems  ROS  Neurologic Exam  Awake, alert, oriented Memory and concentration grossly intact Speech fluent, appropriate CN grossly intact Motor exam: Upper Extremities Deltoid Bicep Tricep Grip  Right 5/5 5/5 5/5 5/5  Left 5/5 5/5 5/5 5/5   Lower Extremities IP Quad PF DF EHL  Right 5/5 5/5 5/5 5/5 5/5  Left 5/5 5/5 5/5 5/5 5/5   Sensation grossly intact to LT  Imaging  MRI of the lumbar spine dated 06/25/2020 reveals chronic compression fractures of L1 and L4.  There is a broad-based disc bulge at L4-5 slightly eccentric to the left with likely compression of the traversing left L5 nerve root in the lateral recess.  Impression  - 75 y.o. male with clinical left L5 radiculopathy likely related to broad-based disc bulge at L4-5 on the left.  Patient has failed reasonable conservative treatment and presents today for surgical decompression.  Plan  -We will plan on proceeding with left L4-5 laminotomy and microdiscectomy for decompression  I have reviewed the indications for surgery in detail with the patient.  We have discussed the associated risks, benefits, and alternatives as well as the expected postoperative course and recovery.  All the patient's questions today have been answered.  He provided informed consent to proceed.  Consuella Lose, MD [facility]

## 2021-06-26 NOTE — Anesthesia Postprocedure Evaluation (Signed)
Anesthesia Post Note  Patient: Tanner Bishop  Procedure(s) Performed: MICRODISCECTOMY LUMBAR FOUR-LUMBAR FIVE (Left: Spine Lumbar)     Patient location during evaluation: PACU Anesthesia Type: General Level of consciousness: awake and alert Pain management: pain level controlled Vital Signs Assessment: post-procedure vital signs reviewed and stable Respiratory status: spontaneous breathing, nonlabored ventilation, respiratory function stable and patient connected to nasal cannula oxygen Cardiovascular status: blood pressure returned to baseline and stable Postop Assessment: no apparent nausea or vomiting Anesthetic complications: no   No notable events documented.  Last Vitals:  Vitals:   06/26/21 0936 06/26/21 0951  BP: 136/75 (!) 141/68  Pulse:    Resp:    Temp:    SpO2:  96%    Last Pain:  Vitals:   06/26/21 0951  TempSrc:   PainSc: 3                  Effie Berkshire

## 2021-06-26 NOTE — Progress Notes (Signed)
Ambulated 300 feet with standby asit/ no issues / states lle feels "much better than it has in a long time"

## 2021-06-26 NOTE — Transfer of Care (Addendum)
Immediate Anesthesia Transfer of Care Note  Patient: Tanner Bishop  Procedure(s) Performed: MICRODISCECTOMY LUMBAR FOUR-LUMBAR FIVE (Left: Spine Lumbar)  Patient Location: PACU  Anesthesia Type:General  Level of Consciousness: drowsy  Airway & Oxygen Therapy: Patient Spontanous Breathing  Post-op Assessment: Report given to RN, Post -op Vital signs reviewed and stable and Patient moving all extremities X 4  Post vital signs: Reviewed and stable  Last Vitals:  Vitals Value Taken Time  BP 151/68 06/26/21 0921  Temp    Pulse 71 06/26/21 0923  Resp 25 06/26/21 0923  SpO2 95 % 06/26/21 0923  Vitals shown include unvalidated device data.  Last Pain:  Vitals:   06/26/21 0549  TempSrc: Oral  PainSc:          Complications: No notable events documented.

## 2021-06-26 NOTE — Op Note (Signed)
  NEUROSURGERY OPERATIVE NOTE   PREOP DIAGNOSIS:  Lumbar disc herniation, L4-5 Lumbar radiculopathy, L5  POSTOP DIAGNOSIS: Same  PROCEDURE: 1. Left L4-5 laminotomy and microdiscectomy for decompression of nerve root 2. Use of operating microscope  SURGEON: Dr. Consuella Lose, MD  ASSISTANT: None  ANESTHESIA: General Endotracheal  EBL: Minimal  SPECIMENS: None  DRAINS: None  COMPLICATIONS: None immediate  CONDITION: Hemodynamically stable to PACU  HISTORY: Tanner Bishop is a 75 y.o. male managed in the outpatient neurosurgery clinic with left-sided back and leg pain consistent with L5 radiculopathy.  Patient is underwent multiple different conservative treatments including epidural steroid injections without significant improvement.  He therefore elected to proceed with surgical decompression.  The risks, benefits, and alternatives to surgery as well as the expected postoperative course and recovery were all explained in detail with the patient.  After all his questions were answered informed consent was obtained and witnessed.  PROCEDURE IN DETAIL: After informed consent was obtained and witnessed, the patient was brought to the operating room. After induction of general anesthesia, the patient was positioned on the operative table in the prone position with all pressure points meticulously padded. The skin of the low back was then prepped and draped in the usual sterile fashion.  After timeout was conducted, under x-ray, spinal needle was introduced in order to identify the surface projection of the L4-5 interspace.  The skin was infiltrated with local anesthetic. Skin incision was then made sharply and Bovie electrocautery was used to dissect the subcutaneous tissue until the lumbodorsal fascia was identified. The fascia was then incised using Bovie electrocautery and the lamina at the L4 levels was identified and dissection was carried out in the subperiosteal plane.  Self-retaining retractor was then placed, and intraoperative x-ray was taken with a dissector in the L4-5 interspace to confirm we were at the correct level.  Using a high-speed drill, laminotomy was completed with a partial medial facetectomy. The ligamentum flavum was then identified and removed and the lateral edge of the thecal sac was identified. This was then traced down to identify the traversing left L5 nerve root.  Using a combination of Kerrison punches, I was able to trace the left L5 nerve root from its origin to its entrance into the foramen.  The ventral epidural space was then dissected with blunt dissection.  A relatively broad disc bulge was identified which may have been compressive on the left L5 nerve root ventrally.  The annulus was therefore incised and using a combination of curettes and rongeurs, the disc bulge in the lateral recess was removed.  Once this was completed I was able to easily pass a ball dissector in the ventral epidural space underneath the L5 nerve root indicating good decompression.  Hemostasis was then secured using a combination of morcellized Gelfoam and thrombin and bipolar electrocautery. The wound is irrigated with copious amounts of antibiotic saline irrigation. The nerve root was then covered with a long-acting steroid solution. Self-retaining retractor was then removed, and the wound is closed in layers using a combination of interrupted 0 Vicryl and 3-0 Vicryl stitches. The skin was closed using standard skin glue.  At the end of the case all sponge, needle, and instrument counts were correct. The patient was then transferred to the stretcher and taken to the postanesthesia care unit in stable hemodynamic condition.   Consuella Lose, MD [facility]

## 2021-06-27 ENCOUNTER — Encounter (HOSPITAL_COMMUNITY): Payer: Self-pay | Admitting: Neurosurgery

## 2021-07-22 ENCOUNTER — Ambulatory Visit: Payer: PPO | Admitting: Internal Medicine

## 2021-07-26 DIAGNOSIS — J8410 Pulmonary fibrosis, unspecified: Secondary | ICD-10-CM | POA: Diagnosis not present

## 2021-08-04 ENCOUNTER — Telehealth: Payer: Self-pay | Admitting: Pharmacist

## 2021-08-04 NOTE — Progress Notes (Signed)
Chronic Care Management Pharmacy Note  08/05/2021 Name:  Tanner Bishop MRN:  [id number] DOB:  [birth year]  Summary: LDL is above goal < 100 Pt is still having some soreness post surgery   Recommendations/Changes made from today's visit: -Recommended high intensity statin therapy based on ASCVD risk > 20% and LDL > 100 -Recommended trial of Voltaren gel for joint pain    Plan: Follow up in 1 year  Subjective: Tanner Bishop is an 75 y.o. year old male who is a primary patient of [name], MD.  The CCM team was consulted for assistance with disease management and care coordination needs.    Engaged with patient by telephone for follow up visit in response to provider referral for pharmacy case management and/or care coordination services.   Consent to Services:  The patient was given information about Chronic Care Management services, agreed to services, and gave verbal consent prior to initiation of services.  Please see initial visit note for detailed documentation.   Patient Care Team: [name], MD as PCP - General Lovena Le Champ Mungo, MD as PCP - Cardiology (Cardiology) Brand Males, MD as Consulting Physician (Pulmonary Disease) Viona Gilmore, [facility] as Pharmacist (Pharmacist)  Recent office visits: 421/22 Alysia Penna, MD: Patient presented for annual exam. LDL increased.  11/19/20 Ofilia Neas, LPN: Patient presented for medicare annual wellness visit.   Recent consult visits: 06/23/21 Brand Males, MD (pulmonology): Patient presented for IPF follow up.  05/26/21 Consuella Lose, MD [facility] neurosurgery): Unable to access notes.  03/11/21 Brand Males, MD (pulmonology): Patient presented for IPF follow up.  01/22/21 Tommye Standard, PA-C (cardiology): Patient presented for SOB.   01/22/21 Gar Ponto, PT (Rehab): Patient presented for PT evaluation for back pain.   08/29/20 Cristopher Peru, MD (Cardiology): Patient presented for Afib follow up. Refilled  flecainide.  Hospital visits: 06/26/21 Patient admitted for lumbar disc herniation with radiculopathy.   Medication Reconciliation was completed by comparing discharge summary, patient's EMR and Pharmacy list, and upon discussion with patient.   Admitted to the hospital on 03.11.2022 due to Right Heart Cath. Discharge date was 03.11.2022. Discharged from [facility]?Medications Started at [facility] Discharge:?? -started None   Medication Changes at Hospital Discharge: -None   Medications Discontinued at Hospital Discharge: -None   Medications that remain the same after Hospital Discharge:??  -All other medications will remain the same.    Objective:  Lab Results  Component Value Date   CREATININE 1.16 06/13/2021   CREATININE 1.16 06/13/2021   BUN 24 (H) 06/13/2021   BUN 24 (H) 06/13/2021   GFR 62.25 06/13/2021   GFR 62.25 06/13/2021   GFRNONAA 55 (L) 01/22/2021   GFRAA 64 01/22/2021   NA 135 06/13/2021   NA 135 06/13/2021   K 4.5 06/13/2021   K 4.5 06/13/2021   CALCIUM 9.0 06/13/2021   CALCIUM 9.0 06/13/2021   CO2 30 06/13/2021   CO2 30 06/13/2021    Lab Results  Component Value Date/Time   HGBA1C 6.4 04/03/2021 09:19 AM   HGBA1C 6.0 01/24/2016 09:52 AM   GFR 62.25 06/13/2021 01:10 PM   GFR 62.25 06/13/2021 01:10 PM    Last diabetic Eye exam: No results found for: HMDIABEYEEXA  Last diabetic Foot exam: No results found for: HMDIABFOOTEX   Lab Results  Component Value Date   CHOL 225 (H) 04/03/2021   HDL 69.70 04/03/2021   LDLCALC 137 (H) 04/03/2021   LDLDIRECT 137.9 12/17/2010  TRIG 90.0 04/03/2021   CHOLHDL 3 04/03/2021    Hepatic Function Latest Ref Rng & Units 06/13/2021 06/13/2021 04/03/2021  Total Protein 6.0 - 8.3 g/dL 7.3 7.3 7.2  Albumin 3.5 - 5.2 g/dL 4.2 4.2 4.0  AST 0 - 37 U/L _0 ALT 0 - 53 U/L _1 Alk Phosphatase 39 - 117 U/L 49 49 45  Total Bilirubin 0.2 - 1.2 mg/dL 0.4 0.4 0.6  Bilirubin, Direct 0.0 -  0.3 mg/dL - 0.1 0.1    Lab Results  Component Value Date/Time   TSH 2.68 04/03/2021 09:19 AM   TSH 2.90 02/29/2020 09:26 AM    CBC Latest Ref Rng & Units 06/13/2021 04/03/2021 02/21/2021  WBC 4.0 - 10.5 K/uL 10.6(H) 9.7 -  Hemoglobin 13.0 - 17.0 g/dL 15.0 15.1 11.6(L)  Hematocrit 39.0 - 52.0 % 44.3 45.6 34.0(L)  Platelets 150.0 - 400.0 K/uL 259.0 314.0 -    Lab Results  Component Value Date/Time   VD25OH 14.78 (L) 02/28/2019 08:34 AM   VD25OH 36.13 01/24/2016 09:52 AM    Clinical ASCVD: No  The 10-year ASCVD risk score Mikey Bussing DC Jr., et al., 2013) is: 26.3%   Values used to calculate the score:     Age: 71 years     Sex: Male     Is Non-Hispanic African American: No     Diabetic: No     Tobacco smoker: No     Systolic Blood Pressure: 449 mmHg     Is BP treated: No     HDL Cholesterol: 69.7 mg/dL     Total Cholesterol: 225 mg/dL    Depression screen [facility] 2/9 04/03/2021 11/19/2020 02/29/2020  Decreased Interest 0 0 0  Down, Depressed, Hopeless 0 0 0  PHQ - 2 Score 0 0 0  Altered sleeping 0 0 -  Tired, decreased energy 1 0 -  Change in appetite 0 0 -  Feeling bad or failure about yourself  0 0 -  Trouble concentrating 0 0 -  Moving slowly or fidgety/restless 0 0 -  Suicidal thoughts 0 0 -  PHQ-9 Score 1 0 -  Difficult doing work/chores Not difficult at all Not difficult at all -  Some recent data might be hidden     CHA2DS2/VAS Stroke Risk Points  Current as of yesterday     1 >= 2 Points: High Risk  1 - 1.99 Points: Medium Risk  0 Points: Low Risk    Last Change: N/A      Details    This score determines the patient's risk of having a stroke if the  patient has atrial fibrillation.       Points Metrics  0 Has Congestive Heart Failure:  No    Current as of yesterday  0 Has Vascular Disease:  No    Current as of yesterday  0 Has Hypertension:  No    Current as of yesterday  1 Age:  37    Current as of yesterday  0 Has Diabetes:  No    Current as of yesterday   0 Had Stroke:  No  Had TIA:  No  Had Thromboembolism:  No    Current as of yesterday  0 Male:  No    Current as of yesterday     Social History   Tobacco Use  Smoking Status Former   Packs/day: 1.00   Years: 16.00   Pack years: 16.00   Types: Cigarettes   Quit  date: 12/14/1978   Years since quitting: 42.6  Smokeless Tobacco Never   BP Readings from Last 3 Encounters:  06/26/21 (!) 141/68  06/24/21 136/77  06/13/21 130/68   Pulse Readings from Last 3 Encounters:  06/26/21 80  06/24/21 65  06/13/21 67   Wt Readings from Last 3 Encounters:  06/24/21 171 lb 11.2 oz (77.9 kg)  06/13/21 171 lb (77.6 kg)  04/03/21 170 lb (77.1 kg)    Assessment/Interventions: Review of patient past medical history, allergies, medications, health status, including review of consultants reports, laboratory and other test data, was performed as part of comprehensive evaluation and provision of chronic care management services.   SDOH:  (Social Determinants of Health) assessments and interventions performed: No   CCM Care Plan  No Known Allergies  Medications Reviewed Today     Reviewed by Viona Gilmore, [facility] (Pharmacist) on 08/05/21 at [phone]  Med List Status: <None>   Medication Order Taking? Sig Documenting Provider Last Dose Status Informant  acetaminophen (TYLENOL) 325 MG tablet [id number] Yes Take 650 mg by mouth every 6 (six) hours as needed. [provider] Taking Active Self  calcium carbonate (CALCIUM 600) 600 MG TABS tablet [id number] Yes Take 600 mg by mouth daily. [provider] Taking Active   chlorpheniramine-HYDROcodone Amanda Cockayne [facility]) 10-8 MG/5ML Latanya Presser [id number] Yes Take 5 mLs by mouth 2 (two) times daily as needed for cough (PRN, cough, ipf, chronic cough, palliation). Brand Males, MD Taking Active   chlorpheniramine-HYDROcodone (TUSSIONEX) 10-8 MG/5ML suspension 5 mL [id number]   Brand Males, MD  Active   flecainide (TAMBOCOR) 50 MG  tablet [id number]  Take 50 mg by mouth 3 (three) times daily. [provider]  Active Self  fluticasone (FLONASE) 50 MCG/ACT nasal spray [id number]  Place 2 sprays into both nostrils daily as needed for allergies. Consuella Lose, MD  Active   Nintedanib (OFEV) 150 MG CAPS [id number]  Take 1 capsule (150 mg total) by mouth 2 (two) times daily. Consuella Lose, MD  Active   pantoprazole (PROTONIX) 40 MG tablet [id number]  TAKE 1 TABLET BY MOUTH TWICE A DAY [name], MD  Active   predniSONE (DELTASONE) 5 MG tablet [id number]  Take 1 tablet (5 mg total) by mouth daily with breakfast. Consuella Lose, MD  Active   zolpidem (AMBIEN) 5 MG tablet [id number]  Take 1 tablet (5 mg total) by mouth at bedtime as needed for sleep. 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[place]) 08/05/2020   Chronic left-sided low back pain with left-sided sciatica 05/22/2020   Palpitations 07/19/2019   Irregular heart rhythm 08/18/2018   Dyspnea 08/18/2018   Diarrhea 11/02/2016   Research study patient 09/24/2015   Atrial flutter ([place]) 04/08/2015   Hyperglycemia 02/27/2015   Hyperlipidemia 02/27/2015   Atrial flutter, unspecified 02/11/2015   Renal stone 07/12/2014   Personal history of colonic polyps 08/18/2013   Family history of malignant neoplasm of gastrointestinal tract 08/18/2013   Malar rash 07/13/2013   Dysphagia, unspecified(787.20) 03/06/2013   Chronic cough 02/16/2013   GERD 01/29/2010    Immunization History  Administered Date(s) Administered   Fluad Quad(high Dose 65+) 08/25/2019   Influenza Split 09/13/2013   Influenza, High Dose Seasonal PF 10/06/2016, 09/16/2017, 08/17/2018   Influenza-Unspecified 09/07/2014, 09/14/2015, 09/13/2020   PFIZER(Purple Top)SARS-COV-2 Vaccination 02/12/2020, 03/12/2020, 08/14/2020  Pneumococcal Conjugate-13  02/16/2017   Pneumococcal Polysaccharide-23 05/26/2013   Td 12/15/2007   Tdap 02/23/2018   Zoster Recombinat (Shingrix) 10/13/2018, 07/25/2019   Zoster, Live 02/19/2014   Patient had a back surgery and is having general soreness. Recommended trial of Voltaren gel if it is joint pain. Patient has been doing some stretching and is walking some every day. He currently tries to alternate with Aleve and Tylenol.  Patient's biggest concern right now is with his lungs and lung disease. He notices that they are getting worse and has lasted 8 years like this when the average life expectancy is 2-3 years.   Conditions to be addressed/monitored:  Hyperlipidemia, Atrial Fibrillation, GERD, Osteoarthritis and idiopathic pulmonary fibrosis, insomnia, sinusitis, vitamin D deficiency  Conditions addressed this visit: Hyperlipidemia, osteoarthritis  Care Plan : CCM Pharmacy Care Plan  Updates made by Viona Gilmore, [place] since 08/05/2021 12:00 AM     Problem: Problem: Hyperlipidemia, Atrial Fibrillation, GERD, Osteoarthritis and idiopathic pulmonary fibrosis, insomnia, sinusitis, vitamin D deficiency      Long-Range Goal: Patient-Specific Goal   Start Date: 02/05/2021  Expected End Date: 02/05/2022  Recent Progress: On track  Priority: High  Note:   Current Barriers:  Unable to independently monitor therapeutic efficacy  Pharmacist Clinical Goal(s):  Patient will achieve adherence to monitoring guidelines and medication adherence to achieve therapeutic efficacy through collaboration with PharmD and provider.   Interventions: 1:1 collaboration with [name], MD regarding development and update of comprehensive plan of care as evidenced by provider attestation and co-signature Inter-disciplinary care team collaboration (see longitudinal plan of care) Comprehensive medication review performed; medication list updated in electronic medical record  Hyperlipidemia: (LDL goal <  100) -Uncontrolled -Current treatment: No medications -Medications previously tried: none  -Current dietary patterns:  trying to avoid fatty foods and avoiding cheese; patient eats lots of fruits and vegetables -Current exercise habits: patient states he walks daily -Educated on Cholesterol goals;  Benefits of statin for ASCVD risk reduction; Importance of limiting foods high in cholesterol; Exercise goal of 150 minutes per week; -Counseled on diet and exercise extensively  Atrial Flutter (Goal: prevent stroke and major bleeding) -Controlled -CHADSVASC: 1 -Current treatment: Rhythm control: flecainide 25m, 1 tablet three times daily Anticoagulation: none -Medications previously tried: none -Home BP and HR readings: could not provide  -Counseled on  importance of monitoring heart rate along with BP  -Recommended to continue current medication  Osteopenia (Goal prevent fractures) -Controlled -Last DEXA Scan: 07/09/20  T-Score femoral neck: -0.6  T-Score total hip: n/a   T-Score lumbar spine: -1.7   T-Score forearm radius: n/a  10-year probability of major osteoporotic fracture: 5.3%  10-year probability of hip fracture: 1.0% -Patient is not a candidate for pharmacologic treatment -Current treatment  Vitamin D 1000 mg 1 tablet daily Calcium 600 mg daily -Medications previously tried: none  -Recommend [id number] units of vitamin D daily. Recommend 1200 mg of calcium daily from dietary and supplemental sources. Recommend weight-bearing and muscle strengthening exercises for building and maintaining bone density. -Recommended to continue current medication Recommended repeat vitamin D level  GERD (Goal: minimize symptoms) -Controlled -Current treatment  pantoprazole 466m 1 tablet twice daily -Medications previously tried: none  -Recommended to continue current medication  Chronic back pain (Goal: minimize pain) -Controlled -Current treatment  Aleve 1 tablet as  needed Tylenol 500 mg as needed (alternates with Aleve) -Medications previously tried: none  -Recommended to continue current medication  Insomnia (Goal: improve quality and quantity of sleep) -  Controlled -Current treatment  Zolpidem 5 mg 1 tablet at bedtime as needed (once every 2-3 weeks) -Medications previously tried: none  -Counseled on practicing good sleep hygiene by setting a sleep schedule and maintaining it, avoid excessive napping, following a nightly routine, avoiding screen time for 30-60 minutes before going to bed, and making the bedroom a cool, quiet and dark space   Health Maintenance -Vaccine gaps: COVID booster, influenza -Current therapy:  none -Educated on Cost vs benefit of each product must be carefully weighed by individual consumer -Patient is satisfied with current therapy and denies issues - Recommended to continue as is  Patient Goals/Self-Care Activities Patient will:  - take medications as prescribed target a minimum of 150 minutes of moderate intensity exercise weekly  Follow Up Plan: Telephone follow up appointment with care management team member scheduled for: 1 year        Medication Assistance: None required.  Patient affirms current coverage meets needs.  Compliance/Adherence/Medication fill history: Care Gaps: Hep C screening, COVID booster, influenza   Star-Rating Drugs: None  Patient's preferred pharmacy is:  [id number] [place], [place]Phone: [phone]: [phone] [place]- [address] [place]Phone: [phone]: [phone] [place] [address] [place] [place]Phone: [phone]: [phone] [place]Phone: [phone]: [phone] Uses pill box? No - takes most of them after meals  Pt endorses 100% compliance   We discussed: Current pharmacy is preferred with insurance plan and patient is satisfied with pharmacy services Patient decided to: Continue current medication management strategy  Care Plan and Follow Up Patient Decision:  Patient agrees to Care Plan and Follow-up.  Plan: Telephone follow up appointment with care management team member scheduled for:  1 year  MJeni Salles PharmD BNorth Shore HealthClinical Pharmacist [place]andy [place]at [phone] Prefers mornings

## 2021-08-04 NOTE — Chronic Care Management (AMB) (Signed)
    Chronic Care Management Pharmacy Assistant   Name: Tanner Bishop  MRN: [id number] DOB: November 23, 1946  08/04/21-  Called patient to remind of appointment with Jeni Salles on (08/05/21 at [place] via telephone.)  Patient aware of appointment date, time, and type of appointment (either telephone or in person). Patient aware to have/bring all medications, supplements, blood pressure and/or blood sugar logs to visit.  Questions: Have you had any recent office visit or specialist visit outside of [place]? No.   Are there any concerns you would like to discuss during your office visit? Cant think of anything at this time.  Are you having any problems obtaining your medications? No issues at this time.  If patient has any PAP medications ask if they are having any problems getting their PAP medication or refill? Patient receives assistance for OFEV.  Care Gaps:  AWV - scheduled for 11/20/21 Hepatitis C screening - never done COVID-19 vaccine booster 4 - overdue since 11/13/20  Star Rating Drug:  None.   Any gaps in medications fill history? No.  [place]  Clinical Pharmacist Assistant [phone]

## 2021-08-05 ENCOUNTER — Ambulatory Visit (INDEPENDENT_AMBULATORY_CARE_PROVIDER_SITE_OTHER): Payer: PPO | Admitting: Pharmacist

## 2021-08-05 DIAGNOSIS — I480 Paroxysmal atrial fibrillation: Secondary | ICD-10-CM | POA: Diagnosis not present

## 2021-08-05 DIAGNOSIS — E782 Mixed hyperlipidemia: Secondary | ICD-10-CM

## 2021-08-12 ENCOUNTER — Encounter: Payer: Self-pay | Admitting: Family Medicine

## 2021-08-12 ENCOUNTER — Ambulatory Visit (INDEPENDENT_AMBULATORY_CARE_PROVIDER_SITE_OTHER): Payer: PPO | Admitting: Family Medicine

## 2021-08-12 ENCOUNTER — Other Ambulatory Visit: Payer: Self-pay

## 2021-08-12 VITALS — BP 120/72 | HR 75 | Temp 98.0°F | Wt 168.0 lb

## 2021-08-12 DIAGNOSIS — M25552 Pain in left hip: Secondary | ICD-10-CM

## 2021-08-12 MED ORDER — DICLOFENAC SODIUM 75 MG PO TBEC: 75.0000 mg | DELAYED_RELEASE_TABLET | Freq: Two times a day (BID) | ORAL | 5 refills | Status: DC

## 2021-08-12 NOTE — Progress Notes (Signed)
Subjective:    Patient ID: Tanner Bishop, male    DOB: Jul 31, 1946, 75 y.o.   MRN: [id number]  HPI Here for several weeks of pain in the left hip. No hx of falls. On 06-26-21 he had a microdiskectomy on L4-5 per Dr. Kathyrn Sheriff, and this was successful in relieving his low back pain and the radicular pain he had down the left leg into the foot. Now he has a very different pain in the hip.    Review of Systems  Constitutional: Negative.   Respiratory: Negative.    Cardiovascular: Negative.   Musculoskeletal:  Positive for arthralgias.      Objective:   Physical Exam Constitutional:      General: He is not in acute distress.    Appearance: Normal appearance.  Cardiovascular:     Rate and Rhythm: Normal rate and regular rhythm.     Pulses: Normal pulses.     Heart sounds: Normal heart sounds.  Pulmonary:     Effort: Pulmonary effort is normal.     Breath sounds: Normal breath sounds.  Musculoskeletal:     Comments: The left hip has a very reduced ROM due to pain, especially on internal and external rotation. The right hip has crepitus but ROM is full.   Neurological:     Mental Status: He is alert.          Assessment & Plan:  He almost certainly has significant osteoarthritis in the left hip. He can use Diclofenac as needed for pain. We will refer him to Orthopedics.  Alysia Penna, MD

## 2021-08-14 ENCOUNTER — Telehealth: Payer: Self-pay | Admitting: Internal Medicine

## 2021-08-14 ENCOUNTER — Ambulatory Visit: Payer: PPO | Admitting: Internal Medicine

## 2021-08-14 ENCOUNTER — Encounter: Payer: Self-pay | Admitting: Internal Medicine

## 2021-08-14 ENCOUNTER — Other Ambulatory Visit: Payer: Self-pay

## 2021-08-14 VITALS — BP 120/64 | HR 72 | Temp 97.6°F | Ht 69.0 in | Wt 172.2 lb

## 2021-08-14 DIAGNOSIS — J84112 Idiopathic pulmonary fibrosis: Secondary | ICD-10-CM | POA: Diagnosis not present

## 2021-08-14 DIAGNOSIS — R0600 Dyspnea, unspecified: Secondary | ICD-10-CM | POA: Diagnosis not present

## 2021-08-14 DIAGNOSIS — R053 Chronic cough: Secondary | ICD-10-CM | POA: Diagnosis not present

## 2021-08-14 DIAGNOSIS — R0609 Other forms of dyspnea: Secondary | ICD-10-CM

## 2021-08-14 DIAGNOSIS — J8410 Pulmonary fibrosis, unspecified: Secondary | ICD-10-CM

## 2021-08-14 LAB — BASIC METABOLIC PANEL
BUN: 19 mg/dL (ref 6–23)
CO2: 32 mEq/L (ref 19–32)
Calcium: 9.3 mg/dL (ref 8.4–10.5)
Chloride: 102 mEq/L (ref 96–112)
Creatinine, Ser: 1.16 mg/dL (ref 0.40–1.50)
GFR: 62.17 mL/min (ref >60.00)
Glucose, Bld: 133 mg/dL — ABNORMAL HIGH (ref 70–99)
Potassium: 5 mEq/L (ref 3.5–5.1)
Sodium: 140 mEq/L (ref 135–145)

## 2021-08-14 LAB — CBC WITH DIFFERENTIAL/PLATELET
Basophils Absolute: 0 10*3/uL (ref 0.0–0.1)
Basophils Relative: 0.5 % (ref 0.0–3.0)
Eosinophils Absolute: 0.1 10*3/uL (ref 0.0–0.7)
Eosinophils Relative: 1.1 % (ref 0.0–5.0)
HCT: 43.3 % (ref 39.0–52.0)
Hemoglobin: 14.4 g/dL (ref 13.0–17.0)
Lymphocytes Relative: 22.4 % (ref 12.0–46.0)
Lymphs Abs: 1.4 10*3/uL (ref 0.7–4.0)
MCHC: 33.3 g/dL (ref 30.0–36.0)
MCV: 100 fl (ref 78.0–100.0)
Monocytes Absolute: 0.6 10*3/uL (ref 0.1–1.0)
Monocytes Relative: 9.6 % (ref 3.0–12.0)
Neutro Abs: 4.3 10*3/uL (ref 1.4–7.7)
Neutrophils Relative %: 66.4 % (ref 43.0–77.0)
Platelets: 284 10*3/uL (ref 150.0–400.0)
RBC: 4.33 Mil/uL (ref 4.22–5.81)
RDW: 15.3 % (ref 11.5–15.5)
WBC: 6.4 10*3/uL (ref 4.0–10.5)

## 2021-08-14 LAB — HEPATIC FUNCTION PANEL
ALT: 12 U/L (ref 0–53)
AST: 18 U/L (ref 0–37)
Albumin: 3.8 g/dL (ref 3.5–5.2)
Alkaline Phosphatase: 56 U/L (ref 39–117)
Bilirubin, Direct: 0.1 mg/dL (ref 0.0–0.3)
Total Bilirubin: 0.4 mg/dL (ref 0.2–1.2)
Total Protein: 6.6 g/dL (ref 6.0–8.3)

## 2021-08-14 LAB — D-DIMER, QUANTITATIVE: D-Dimer, Quant: 0.28 mcg/mL FEU (ref <0.50)

## 2021-08-14 LAB — TROPONIN I (HIGH SENSITIVITY): High Sens Troponin I: 10 ng/L (ref 2–17)

## 2021-08-14 LAB — BRAIN NATRIURETIC PEPTIDE: Pro B Natriuretic peptide (BNP): 43 pg/mL (ref 0.0–100.0)

## 2021-08-14 NOTE — Telephone Encounter (Signed)
Labs all normal. SO not sure why worse  Plan - he should await respnse from Dr Lovena Le -We will look at the echo done pretty soon - Do high-resolution CT chest supine and prone - -We can do a prednisone burst in case there is ILD flareup -asking for his thoughts about this -Any worsening go to the ER   LABS  Results for Tanner Bishop, Tanner Bishop (MRN [id number]) as of 08/14/2021 18:11  Ref. Range 08/14/2021 14:39  D-Dimer, America Brown Latest Ref Range: <0.50 mcg/mL FEU 0.28   Results for Tanner Bishop, Tanner Bishop (MRN [id number]) as of 08/14/2021 18:11  Ref. Range 08/14/2021 14:39  High Sens Troponin I Latest Ref Range: 2 - 17 ng/L 10   PULMONARY No results for input(s): PHART, PCO2ART, PO2ART, HCO3, TCO2, O2SAT in the last 168 hours.  Invalid input(s): PCO2, PO2  CBC Recent Labs  Lab 08/14/21 1442  HGB 14.4  HCT 43.3  WBC 6.4  PLT 284.0    COAGULATION No results for input(s): INR in the last 168 hours.  CARDIAC  No results for input(s): TROPONINI in the last 168 hours. Recent Labs  Lab 08/14/21 1439  PROBNP 43.0     CHEMISTRY Recent Labs  Lab 08/14/21 1439  NA 140  K 5.0  CL 102  CO2 32  GLUCOSE 133*  BUN 19  CREATININE 1.16  CALCIUM 9.3   Estimated Creatinine Clearance: 55.9 mL/min (by C-G formula based on SCr of 1.16 mg/dL).   LIVER Recent Labs  Lab 08/14/21 1439  AST 18  ALT 12  ALKPHOS 56  BILITOT 0.4  PROT 6.6  ALBUMIN 3.8     INFECTIOUS No results for input(s): LATICACIDVEN, PROCALCITON in the last 168 hours.   ENDOCRINE CBG (last 3)  No results for input(s): GLUCAP in the last 72 hours.       IMAGING x48h  - image(s) personally visualized  -   highlighted in bold No results found.

## 2021-08-14 NOTE — Progress Notes (Signed)
#IPF - uip path 03/22/13  - On OFEV since early May 2015   PFT FVC fev1 ratio BD fev1 TLC DLCO Walk test 172f x 3 laps wt rx             Spring 2014 2.9:L L/% /%        June 2015 2.7L/67% 2.34L/78% 86  3.68/57% 20.24/71%   ofev start  [name]  2016 2.5L/55% 2.1L/60% 84/108%    No desat, Pk HR 130    02/11/2015 Screening visit afferent cough study 2.59L/56% 2.24L/63% 87/112%    Dx with afluttter on ekg   Screen failed due to hx of stones  03/26/2015        No desat, PK HR 130    08/26/15 pft machine 2.73L/68% 2.38L/80% 87   [name]     06/29/2016 Office spiro 2.49L/56% 2.14L/65%     Pk HR 90 and lowest pulse ox 99% ->93%  ofev  11/02/2016  office full pft  2.61L/66% 2.3L/79%    17.23/60%   pfev  09/16/2017  2.53L/64%      Pulse ox 99% -> 93%, HR 81- > 84  ofev  12/22/2017        100% -> 97%,  HR 66 -> 96    09/19/2018        100% -> 93, HR 74 -> 85         OV 03/26/2015  Chief Complaint  Patient presents with   Follow-up    [name], dry cough. CATH procedure on 04/08/15. Denies any chest tightnes/congestion.    75 year old male with idiopathic pulmonary fibrosis. He is on Ofev after having failed Esbriet in the past due to side effects. He is tolerating Ofev well. Liver function test in March 2016 was normal. I last saw him in January 2016. Subsequently in February 2016 we screened him for a cough idiopathic pulmonary fibrosis study called afferent but he screen failed due to history of renal stones. At this time he was incidentally diagnosed with new onset atrial flutter. He has seen Dr. GCrissie Sicklesand has been started on anticoagulation with Xarelto and metoprolol. He is due to undergo a ablation. He is not aware of the increased bleeding risk with Ofev in the setting of anticoagulation. He assures me that the anti-coag and is only until he undergoes ablation and after that the plan is to stop it. Therefore only short-term anticoagulation with Xarelto.  Overall his effort tolerance is fine. He has postponed his [facility] lung transplant until he completes his ablation.  Walking desaturation test in the office today he did not desaturate. This is stable. Spirometry not done   Past medical history: Atrial flutter new diagnoses as above   OV 06/29/2016  Chief Complaint  Patient presents with   Follow-up    pt states he is at baseline: sob with exertion, nonprod cough.      75 year old male with idiopathic pulmonary fibrosis. He is on Ofev . He completed 6 months of PRAISE study (([id number] placebo) in l;ate winter/early spring 2017. This is [place]. Overall stable. No worsening dyspnea and cough. Continues daily exercise is walking many miles. Some days he is sitting more than the others. He is interested in more trials and results of the praise study. These results are not out yet. He is compliant with his Ofe last liver function test was in February 2017. There are no new issues. He believes his atrial fibrillation is  in sinus rhythm;    OV  11/02/2016  Chief Complaint  Patient presents with   Follow-up    4 month IPF follow up and B&A review - does report increased prod cough with yellow mucus, wheezing, tightness in chest, increased SOB, x3-4 weeks with the weather change.  denies any f/c/s, hemoptysis, chest pain   IPF - on ofev. Last liver function test was in July 2017 and normal. Overall dyspnea stable. However he is been having diarrhea with ofev. He called in and we reduced it to 1 tablet a day and this improved the diarrhea. He back on 1 tablet twice a day. The diarrhea has returned although it is much milder. He has never taken any medication for this. Are function test shows slight improvement from July 2017 and similar levels to approximately one year ago  The new issue that he's having significant recurrent sinus infection in the back of chronic postnasal drainage. This since the fall 2017. He says that he and his wife catch  respirator infection and passive back and forth to each other. He is having cough, chest tightness, postnasal drip and yellow sputum. It is not affecting any dyspnea. There is no wheeze. There is no fever or hemoptysis.  OV 03/02/2017  Chief Complaint  Patient presents with   Follow-up    Pt states his breathing is at baseline. Pt c/o dry cough - pt states this is baseline. Pt denies CP/tightness.     Idiopathic pulmonary fibrosis on Ofev. Last seen November 2017. He is a routine follow-up.  He is here with his wife. His sinus infections a result. He is interested in research protocols but we have none right now. He is tolerating his Ofev associated with some mild diarrhea occasional which she takes medication. He is not much of a problem. He tells me that she walks 6 times a week for 5 miles a day. On the treadmill at a 5 incline walking at 3-1/2 miles an hour. For this he feels a little more dyspneic than before. Walking desaturation test 185 feet 3 laps on room air: His pulse ox dropped from 98% at rest and 93% with exertion. His heart rate jumped from 66 at rest and 97 at peak exertion. Features are suggestive of mild progression of the disease. He is not attending pulmonary fibrosis foundation because he felt this was negative but that was years ago. We discussed this and he might be interested again.     OV 09/16/2017  Chief Complaint  Patient presents with   Follow-up    PFT done today. Pt states that his breathing is gradually getting worse. SOB which depends if it is on exertion vs all the time and c/o prod cough with yellow mucus. Denies any CP   S IPF on fev followup. Overall doing well. He recent bronchitis and liked anabiotic and prednisone. He felt the prednisone helped his cough just currently rated as mild to moderate in severity. Helped the shortness of breath. He also helped arthralgia. He is now wondering what taking chronic prednisone. We had an extensive discussion about  this. Spirometry shows that the FVC stable compared to earlier this year but slightly progressive compared to one year ago. [facility] interstitial lung disease questionnaire shows symptoms. His wife is here with him. He is interested in research trials. He has participated in distress before. He is asking about opioid refill for cough if I wont do prednisone.   OV 12/22/2017  Chief Complaint  Patient  presents with   Follow-up    Pt states he believes his breathing is at a stable point right now. States that he is coughing which is worse when he first wakes up and then also at night; occ will cough up yellow phlegm.     C.o ofev intolerance with fatigue, weight loss, diarrhea. Does not like lomotil. Wnaant to give it a holiday foir 2-3 weeks. INterested in research protocol - discussed [place]. 5 year cut off since diagnosis coming up 03/22/18. He is overall frustrated ith qualtiy of life. Walks 5 miles; starts feeling dizzy > 3 miles in but not checking pulse ox despite advice. Walking desaturation test on 12/22/2017 185 feet x 3 laps on ROOM AIR:  did noit desaturate. Rest pulse ox was 100%, final pulse ox was 97%. HR response 66/min at rest to 96/min at peak exertion. Patient Nahum Aung  Did not Desaturate < 88% . Artin Bitting yes  Desaturated </= 3% points. Jacobb Brann yes did get tachyardic     Walking desaturation test on 02/01/2018 185 feet x 3 laps on ROOM AIR:  did not desaturate. Rest pulse ox was 100%, final pulse ox was 91%. HR response 67/min at rest to 85/min at peak exertion. Patient Ethelbert Gerst  dod not Desaturate < 88% . Elmus Steege yes diod  Desaturated </= 3% points. Caedan Carlini did not get tachyardic   OV 02/01/2018  Chief Complaint  Patient presents with   Follow-up    Pt states he has been doing good since last visit. Started back on OFEV 12/24/17 after taking a holiday off of it.     FU IPF  Took ofev holiday. And then restarted 01/24/18 and doing well. [place]in  ofev ok. KBILD ok.   OV 03/16/2018'  . Chief Complaint  Patient presents with   Follow-up    PFT done today. States he has been doing well and denies any current complaints. Tolerating OFEV well and has been walking about 5 miles every day with no complaints.   Ms. Gautier returns for follow-up of idiopathic pulmonary fibrosis.  He is now back on nintedanib after giving it a holiday for a few weeks.  He is tolerating it well and no problems.  He goes for daily walks and has no problems.  His lung function was reviewed today and it shows for the first time a significant decline of greater than 200 cc between 2 time points.  This correlates with him not taking over for a few weeks nevertheless overall he is happy with his health.  He is not interested in research trials anymore.  He has a new question about travel advice encounter.  He plans an [place] from [place], [place] intrajejunal in [place] for 10 days.  This will start on May 22, 2018.  He wants to make sure it is safe for him to go.  He said he has never been on a cruise and he and his wife have a strong desire to go as part of his goals of care.   OV 05/16/2018  Chief Complaint  Patient presents with   Follow-up    Breathing is unchanged since last OV.    Tanner Bishop , 75 y.o. , with dob 1946-02-16 and male ,Not Hispanic or Latino from [address] [place] [address] - presents to pulm ILD clinic for IPF.  He presents with his wife.  And just under 7 days he is going to embark on a [place]  cruise.  This visit is precautionary visit just before that.  Most recent visit was in April 2019.  At this point in time in terms of his IPF he feels stable.  He is compliant with full dose of 5 and is having only mild diarrhea.  Other than that he is tolerating it just fine.  He is looking forward to his cruise.  He had pulmonary function test today that shows an improvement compared to last visit and it is very  similar to summer/fall 2018 in terms of FVC and DLCO.  Although he does feel like he is coming down with an acute bronchitis and wants to take some antibiotic and prednisone I had of the trip.  We also discussed about him having prophylactic antibiotics handy.  He wants a refill on his Tussionex for cough.  At this point in time he is past 5 years of diagnosis and not eligible for research trials he is not interested in transplant although the recent pulmonary fibrosis foundation meeting he did hear from a lot of transplant patients about the individual personal experience.       OV 09/19/2018  Subjective:  Patient ID: Tanner Bishop, male , DOB: [birth year] , age 21 y.o. , MRN: [id number] , ADDRESS: [place] [id number]   09/19/2018 -   Chief Complaint  Patient presents with   Follow-up    Pt saw cards 08/23/18. States he is still having problems with his heart going in and out of rhythm and states he has had some episodes to where he almost passes out. States his breathing is at a stable point. States he also has  an occ cough.     HPI Tanner Bishop 75 y.o. -presents 5-year follow-up.  I personally saw him in June 2019.  Then approximately a month ago he presented and saw acutely my nurse practitioner because of dizziness.  It was felt to be A. fib RVR.  He subsequently saw Dr. Crissie Sickles his electrophysiologist.  I reviewed the note.  Flecainide was started.  He says despite that l approximately 10 weeks ago he had another episode of dizziness while getting out of the shower.  He was tachycardic with a heart rate of 140s.  He felt he might pass out but there is no focal neurologic deficits or abnormal sensation.  His pulse ox was normal at 97% at that time.  It happened at rest.  He says he has been walking on the treadmill for several miles and he does not desaturate.  He says his pulse ox is 93% at that time.  He never drops into the 80s.  He does not think his dizziness and  palpitation issues are related to his pulmonary fibrosis.  However [name] is wondering about oxygen drops during this time.  He is not tolerating his nintedanib at full dose without any problems.  He has had a successful [place] trip.  He is participating in the patient support group.  He is up-to-date with his flu shot      OV 10/13/2018  Subjective:  Patient ID: Tanner Bishop, male , DOB: 01/04/1946 , age 30 y.o. , MRN: [id number] , ADDRESS: [place] [id number]   10/13/2018 -   Chief Complaint  Patient presents with   Follow-up    review PFT.  c/o baseline sob with exertion.       HPI Tanner Bishop 75 y.o. -presents for follow-up of IPF to the ILD clinic.  This visit is arranged because letter physiologist [name] was concerned about hypoxemia effects on his atrial fibrillation.  At this point in time patient tells me that after increasing dose of flecainide his atrial fibrillation and palpitations have improved and resolved.  Overall he feels stable.  On September 26, 2018 he had 6-minute walk test and this was pretty robust with walking 384 meters and with lowest pulse ox of 95%.  Then on October 05, 2018 he had overnight pulse oximetry that shows 12 minutes of desaturation less than 88%.  He is not interested in oxygen.  Then he called his October 07, 2018 with worsening bronchitis episodes and we gave him 5 days of prednisone and antibiotics.  He finished his last dose yesterday.  Overall he feels stable but pulmonary function test today shows decline in both FVC and DLCO.  And then when I questioned him he felt like he still had some residual bronchiti and feels another course of prednisone could help him.  There are no other new issues.  Last liver function test October 2019 and was normal.      OV 01/12/2019  Subjective:  Patient ID: Tanner Bishop, male , DOB: Oct 15, 1946 , age 38 y.o. , MRN: [id number] , ADDRESS: [place]  [id number]   01/12/2019 -   Chief Complaint  Patient presents with   Follow-up    PFT performed today. Pt states he is about the same since last visit.States he still becomes SOB with exertion, has a lot of coughing but has been taking mucinex, and also has had some CP or chest tightness.     HPI Tanner Bishop 75 y.o. -returns for follow-up of his IPF.  He is now more than 5 years since diagnosis.,  April 2020 he will be 6 years since diagnosis.  He continues to exercise well on the treadmill and according to his wife he is does well.  He is not dropping oxygen at this time.  He does not have a cough when needed works on the tile.  However at other times the day especially when he is trying to lie down to bed or when he gets up in the morning and goes to the bathroom he has really bad cough.  Overall the cough severity is now 6 out of 10 in terms of how it is impacting his quality of life.  He tells me that every time he takes prednisone for the cough he feels better but then when he comes off prednisone cough gets worse.  He said multiple prednisone courses of brief duration for this chronic cough.  In addition for the last few weeks has had yellow sinus drainage and sinus headache and sinus congestion and he feels this is again making his chronic cough worse.  He is open to taking antibiotic and prednisone course.  In terms of taking nintedanib he has no side effects.  His last liver function test reviewed was in October 2019 and it was normal at that time.  Overall at this point in time he really wants good significant support for his cough in terms of affecting his quality of life.    ROS - per HPI     OV 02/16/2019  Subjective:  Patient ID: Tanner Bishop, male , DOB: 06-04-1946 , age 65 y.o. , MRN: [id number] , ADDRESS: [address] [place] [address]   02/16/2019 -   Chief Complaint  Patient presents with   Follow-up    HRCT  and sinus CT performed 2/18. Pt states he is about the same  as last visit and states SOB is about the same. Pt still has a dry cough. Denies any complaints of CP/chest tightness.     HPI Tanner Bishop 75 y.o. -returns for follow-up of his IPF.  He presents with his wife.  He is here to review the test results.  He had high-resolution CT chest and CT sinus.  These were done in order to reevaluate his disease because he is having significant cough.  His high-resolution CT chest shows only mild progression in his IPF in 3 years.  However there is significant new findings of tracheobronchomalacia although there is no lung cancer.  He also has two-vessel coronary artery calcification.  These are new findings.  Since last visit and a sinus episode he is stable.  He is currently run out of his nasal steroids.  He says the pharmacy said that I declined his prescription request.  I do not recollect such a conversation.  In any event he is now going to participate in the cough study called scenic.  He has received a copy of the consent form.  He is rated.  He has an appointment for his consent visit.  It is possible that the nasal steroid is on the exclusion list but I do not have the exclusion list with me.  Anyway he is not consented.  He is not having chest pain when he does his treadmill exercises.  He works out hard.  He states he gets tachycardic.  He says he has a 6 times that he will die from his heart condition before his pulmonary fibrosis.  He has upcoming appointment with his cardiologist [name] for atrial fibrillation.  ...................................................................... OV 06/08/2019   Subjective:  Patient ID: Tanner Bishop, male , DOB: [birth year] , age 50 y.o. , MRN: [id number] , ADDRESS: [place] [id number]   06/08/2019 -   Chief Complaint  Patient presents with   Follow-up    1wk f/u for IPF. Patient stated that he was given a round of prednisone last week for some SOB but is feeling much better.      HPI Tanner  Bishop 75 y.o. -returns for IPF follow-up.  Last seen in March 2020.  Since the pandemic started he has been social distancing.  He has not attended any support group.  He has been tolerating his Ofev quite well without any difficulty.  Recently had a bronchitis exacerbation and we gave prednisone.  This resolved his cough.  He was going to participate in a cough study but because of the pandemic the study got canceled.  He tells me that every time he takes prednisone the cough goes away.  He is very interested in taking daily prednisone for symptom relief of cough.  We discussed the side effects of prednisone that include weight gain, easy bruising, adrenal insufficiency, osteoporosis, cataracts hypertension, diabetes, immunosuppression.  Despite all this he wants to take a low-dose prednisone daily.  His last liver function test was in March 2020.  This needs to be retested because he is on nintedanib.  His symptom scores at walk test show relative stability.  His last pulmonary function test was in January 2020 but because of the pandemic is on hold.  We are using symptom and walk test to determine progression.         OV 04/11/2020  Subjective:  Patient ID: Tanner Bishop, male , DOB: 01/23/1946 ,  age 55 y.o. , MRN: [id number] , ADDRESS: [address] [place]  [id number]   04/11/2020 -   Chief Complaint  Patient presents with   Follow-up    PFT performed today. Pt states he feels like his breathing is gradually becoming worse and states it could be at any time that he is SOB.   #IPF - uip path 03/22/13  - On OFEV since early May 2015 -On chronic daily prednisone because of cough since 2020  HPI Tanner Bishop 75 y.o. -presents with his wife IPF follow-up.  Last saw him June 2020.  After that he feels his dyspnea is worse.  He says that he could do several miles in one 1 hour and 30 minutes on the treadmill.  The same distance and now taking 1 hour and 40 minutes.  He feels his disease is  getting worse.  He says the prednisone is working well for him because of improved cough and wellbeing.  He is having some skin bruising because of that.  The nintedanib he is tolerating well except for some mild diarrhea.  On objective symptom score is actually similar to 1 year ago but may be slightly worse than 6 months ago or 9 months ago.  On pulmonary function testing he is stable compared to 1 year ago but slow and steadily he is definitely worse over the many years.  We noticed on his walking desaturation test that he did not mount a tachycardic response as he is done in the past.  He is on flecainide for atrial fibrillation.  He takes his 3 times daily.  He feels his atrial fibrillation is under good control.  There is no chest pain.  There is no weight loss.  Is no loss of physical conditioning.  He is interested in clinical trials.   A month ago he had nausea and his amylase/lipase was elevated.  He says he is fine now.  Primary care address this. ROS - per HPI  IMPRESSION: 1. 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 Findings are consistent with UIP per consensus guidelines: Diagnosis of Idiopathic Pulmonary Fibrosis: An Official ATS/ERS/JRS/ALAT Clinical Practice Guideline. [place], Iss 5, [phone], Aug 14 2017. 2. 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 3. Two-vessel coronary atherosclerosis.   Aortic Atherosclerosis (ICD10-I70.0).     Electronically Signed   By: Ilona Sorrel M.D.   On: 01/31/2019 12:24  OV 06/11/2020  Subjective:  Patient ID: Tanner Bishop, male , DOB: [birth year] , age 62 y.o. , MRN: [id number] , ADDRESS: [place] [id number]   06/11/2020 -   Chief Complaint  Patient presents with   Follow-up    PFT performed today.  Pt states he has been doing good since last visit. States  breathing is about the same.,    #IPF - uip path 03/22/13  - On OFEV since early May 2015 -On chronic daily prednisone because of cough since 2020  HPI Tanner Bishop 75 y.o. -returns to clinic for ILD follow-up.  In the interim he feels that his wife has worsening dementia.  She is able to self-care but only does minimal cooking.  She is more irritable.  His daughter Theadora Rama is now on disability because of back issues.  Patient has a son in [place] who he is close with.  Together they take care of his wife.  He says  he has become the primary caregiver now for his wife.  Nevertheless she is functional and does some ADLs.  He continues with nintedanib 150 mg twice daily.  Overall tolerance is good just mild diarrhea.  This is baseline.  Shortness of breath is baseline symptom score is 14 and similar to the past.  Pulmonary function test appears stable.  Walking desaturation test is stable.  His last liver function test was 2 months ago.  His main issue is that he is having some financial issues covering the cost of nintedanib.  He $6000 grant is running out.  He only has 1 month supply left.  He wants to meet with the pharmacist.      OV 11/12/2020   Subjective:  Patient ID: Tanner Bishop, male , DOB: [birth year], age 4 y.o. years. , MRN: [id number],  ADDRESS: [place] [address] PCP  [name], MD Providers : Treatment Team:  Attending Provider: Brand Males, MD Patient Care Team: [name], MD as PCP - General Brand Males, MD as Consulting Physician (Pulmonary Disease) [place], Anne Ng, [facility] as Pharmacist (Pharmacist)    Chief Complaint  Patient presents with   Follow-up    IPF, still getting SOB on DOE with some chest tightness       HPI Tanner Bishop 75 y.o. -IPF follow-up.  Presents with his wife who I am seeing after a long time.  According to the patient patient himself is stable from IPF standpoint.  However he says for the last  several months he has had an exacerbation in his chronic back pain.  He has significant pain.  He has had steroid injections.  The first 1 was successful but the second 1 and subsequent failed.  He is not able to do his walks as he used to in the past.  He has gained some weight.  He is frustrated by this.  He is asking if he can go see a Restaurant manager, fast food.  The other issue with him was elevated amylase.  We never found a cause for this.  So his primary care physician worked him up he had abdominal CT imaging on 08/16/2020 and this was normal.  Otherwise he is okay  He is asking for a refill on his prednisone that he takes for cough.  He also wants Tussionex as needed.  He takes that for cough.  He also says that he got summoned for jury duty.  He is wondering what to do.  I told him under no circumstances he should ever do jury duty.  I worry about the risk   ROS - per HPI  OV 01/24/2021  Subjective:  Patient ID: Tanner Bishop, male , DOB: [birth year] , age 8 y.o. , MRN: [id number] , ADDRESS: [address] [place] [address] PCP [name], MD Patient Care Team: [name], MD as PCP - General Brand Males, MD as Consulting Physician (Pulmonary Disease) [place], Anne Ng, [facility] as Pharmacist (Pharmacist)  This Provider for this visit: Treatment Team:  Attending Provider: Brand Males, MD    01/24/2021 -   Chief Complaint  Patient presents with   Acute Visit    Increased SOB since last Friday, heart still feels "funny"   Follow-up idiopathic pulmonary fibrosis On nintedanib and low-dose prednisone for cough Idiopathic elevation in amylase unrelated to nintedanib.  Normal abdominal imaging.  HPI Tanner [place] 75 y.o. -returns for an acute visit.  On Friday, 01/22/2021 he says he got up and he felt  like baseline and then when he walked he suddenly noticed that he was suddenly way more short of breath than usual.  His heart started feeling funny.  He took an acute visit with  cardiology.  I reviewed the notes.  I do not see an EKG but he tells me an EKG was done and he was in sinus rhythm.  He had normal troponin, normal BNP and normal D-dimer.  Chest x-ray was baseline.  I personally visualized this.  Therefore he was sent to pulmonary.  We are seeing him as an acute visit today.  His symptom scores are definitely worse than baseline.  He is tolerating his nintedanib and his weight is stable.  His walking desaturation test shows a sudden decline.  For the first time his pulse ox is below 88%.  His pace of walking was much slower.  He tells me he still feels funny in his chest.  There are no Covid symptoms.  He is fully vaccinated.  There is no edema orthopnea proximal nocturnal dyspnea coughing or wheezing.  There is no sputum or fever production.  His wife is here with him..        CT Chest data  DG Chest 2 View  Result Date: 01/23/2021 CLINICAL DATA:  Shortness of breath cardiac palpitations EXAM: CHEST - 2 VIEW COMPARISON:  12/01/2016 FINDINGS: Cardiac shadow is at the upper limits of normal in size but stable. Lungs are well aerated bilaterally with mild fibrotic scarring stable in appearance from the prior exam. No new focal infiltrate or sizable effusion is seen. No acute bony abnormality is noted. IMPRESSION: Chronic scarring without acute abnormality. Electronically Signed   By: Inez Catalina M.D.   On: 01/23/2021 08:24   CT Chest High Resolution  Result Date: 01/24/2021 CLINICAL DATA:  IPF EXAM: CT CHEST WITHOUT CONTRAST TECHNIQUE: Multidetector CT imaging of the chest was performed following the standard protocol without intravenous contrast. High resolution imaging of the lungs, as well as inspiratory and expiratory imaging, was performed. COMPARISON:  01/31/2019, 03/11/2016, 07/24/2015, 02/14/2013 FINDINGS: Cardiovascular: Aortic atherosclerosis. Cardiomegaly. Scattered left coronary artery calcifications. Enlargement of the main pulmonary artery measuring  up to 3.4 cm in caliber. No pericardial effusion. Mediastinum/Nodes: No enlarged mediastinal, hilar, or axillary lymph nodes. Thyroid gland, trachea, and esophagus demonstrate no significant findings. Lungs/Pleura: Redemonstrated moderate pulmonary fibrosis in a pattern with apical to basal gradient, featuring traction bronchiectasis, irregular peripheral interstitial opacity, septal thickening, subpleural bronchiolectasis, and areas of honeycombing at the lung bases. Fibrotic findings are minimally worsened compared to prior examination dated 01/31/2019 and further slightly worsened over a longer period of follow-up dating back to 02/14/2013. No significant air trapping on expiratory phase imaging. Evidence of prior left lung wedge biopsy. No pleural effusion or pneumothorax. Upper Abdomen: No acute abnormality. Musculoskeletal: No chest wall mass or suspicious bone lesions identified. IMPRESSION: 1. Redemonstrated moderate pulmonary fibrosis in a pattern with apical to basal gradient, featuring traction bronchiectasis, irregular peripheral interstitial opacity, septal thickening, subpleural bronchiolectasis, and areas of honeycombing at the lung bases. Fibrotic findings are minimally worsened compared to prior examination dated 01/31/2019 and further slightly worsened over a longer period of follow-up dating back to 02/14/2013. Findings are in a UIP pattern and in keeping with reported diagnosis of IPF. 2. Cardiomegaly and coronary artery disease. 3. Enlargement of the main pulmonary artery, as can be seen in pulmonary hypertension. Aortic Atherosclerosis (ICD10-I70.0). Electronically Signed   By: Eddie Candle M.D.   On: 01/24/2021 09:16  OV 03/11/2021  Subjective:  Patient ID: Tanner [place], male , DOB: [birth year] , age 43 y.o. , MRN: [id number] , ADDRESS: [address] [place] [address] PCP [name], MD Patient Care Team: [name], MD as PCP - General Brand Males, MD  as Consulting Physician (Pulmonary Disease) Viona Gilmore, [facility] as Pharmacist (Pharmacist)  This Provider for this visit: Treatment Team:  Attending Provider: Brand Males, MD    03/11/2021 -   Chief Complaint  Patient presents with   Follow-up    Follow up after PFT.  Pt stated that he has the oxygen but these are the larger tanks.  He is waiting to get the POC from [facility].    Follow-up idiopathic pulmonary fibrosis  - On nintedanib and low-dose prednisone for cough  -Start portable oxygen for progressive disease March 2022  Idiopathic elevation in amylase unrelated to nintedanib.  Normal abdominal imaging.   Normal right heart catheterization 02/21/2021 by Dr. Glori Bickers.    - Mean pressure 17.  Wedge pressure 6.  Cardiac index 2.6.  PVR 2.1  HPI Tanner [place] 75 y.o. -presents for follow-up since his last visit when we noticed exertional hypoxemia.  Got echocardiogram and then sent him to cardiology.  He had right heart catheterization.  The result is reviewed and is normal.  He is tolerating his nintedanib fine.  He has had problems qualifying for oxygen even though he desaturated.  He says something about insurance did not approve my order.  He is also frustrated with [facility].  He wants to switch to [place].  He is interested in clinical trial but current phase 3 trials are on hold because of logistic issues with sponsor STARSC[employer] study. Other studies might be available      OV 06/13/2021  Subjective:  Patient ID: Tanner [place], male , DOB: 1946/03/17 , age 4 y.o. , MRN: [id number] , ADDRESS: [address] [place]  [address] PCP [name], MD Patient Care Team: [name], MD as PCP - General Lovena Le Champ Mungo, MD as PCP - Cardiology (Cardiology) Brand Males, MD as Consulting Physician (Pulmonary Disease) Viona Gilmore, [facility] as Pharmacist (Pharmacist)  This Provider for this visit: Treatment Team:  Attending Provider: Brand Males, MD    06/13/2021 -   Chief Complaint  Patient presents with   Follow-up    PFT  performed 6/30 and cxr performed today. Pt states he feels like he is worse since last visit and states he is also coughing more too. Pt did have Covid and since then he has been worse.      HPI Tysheem Hoiland 75 y.o. -presents for follow-up.  This is unscheduled visit.  He tells me that he is scheduled himself for low back surgery because of worsening sciatica with Dr. Kathyrn Sheriff.  This visit was to be done in the outpatient suite.  When he showed up preoperatively the anesthesiologist rejected surgical fitness because of crackles and because anesthesiologist felt there was pedal edema.  Patient himself denies any pedal edema.  [In fact on exam he does not have pedal edema].  He says he was frustrated with experience because cardiology had cleared him for the procedure.  He also reports that crackles were felt to be due to heart failure although he has pulmonary fibrosis.  In talking to him he did have COVID at the end of May 2022.  After that he is slightly worse.  His FVC shows slight decline but  his DLCO is stable.  Try to do a walk test on him but because of his sciatica he could not walk.  He says he uses oxygen with exercise and mowing yard.  He is really bothered by his pain.  He was wondering about nonsurgical maneuvers to help his pain.  We talked about Tylenol but within limits.  We talked about chiropractor but he says it did not help him.  We talked about dry needling.  He took a recommendation from me or someone that I personally use for dry needling.  However he thinks he will just proceed with surgery.  I discussed Dr. Kathyrn Sheriff and Dr. Kathyrn Sheriff we will plan the surgery in the hospital.  He wants refill of his cough medication.  His subjective symptom score is stable.  His chest x-ray looks stable.  He will have lab blood work today.        OV 08/14/2021  Subjective:  Patient ID: Tanner [place],  male , DOB: [birth year] , age 25 y.o. , MRN: [id number] , ADDRESS: [place] [address] PCP [name], MD Patient Care Team: [name], MD as PCP - General Lovena Le Champ Mungo, MD as PCP - Cardiology (Cardiology) Brand Males, MD as Consulting Physician (Pulmonary Disease) Viona Gilmore, [facility] as Pharmacist (Pharmacist)  This Provider for this visit: Treatment Team:  Attending Provider: Brand Males, MD  Follow-up idiopathic pulmonary fibrosis  - On nintedanib and low-dose prednisone for cough  -Start portable oxygen for progressive disease March 2022  Idiopathic elevation in amylase unrelated to nintedanib.  Normal abdominal imaging.   Normal right heart catheterization 02/21/2021 by Dr. Glori Bickers.    - Mean pressure 17.  Wedge pressure 6.  Cardiac index 2.6.  PVR 2.1  08/14/2021 -   Chief Complaint  Patient presents with   Follow-up    Pt states that his breathing has become worse since last visit.      HPI Tanner [place] 75 y.o. -returns for follow-up.  Approximately 2 weeks ago on 07/30/2021 he had a spinal surgery.  Surgery went well.  After that the back pain is improved and he sustained improvement.  His effort tolerance then improved but in the last 1 week he is progressively more short of breath.  He says he uses 2 L of nasal cannula oxygen at rest and 3 L with exertion but he states he is unable to do the things he used to even a week ago.  There is no associated worsening edema.  No orthopnea no paroxysmal nocturnal dyspnea.  No chest pain.  No hemoptysis no fever no chills no worsening cough.  He is taking his prednisone and nintedanib correctly.  He says the shortness of breath is so worse that he worries that he is going to die pretty soon.  His weight is stable.  He is unclear if he is desaturating but he did notice that he is having fluctuating heart rate.  Intermittently have significant bradycardia to 40.  His ILD symptom score  shows significant worsening.      SYMPTOM SCALE - ILD 02/16/2019  06/08/2019  04/11/2020  06/11/2020  11/12/2020 169# weight 01/24/2021 171# 03/11/2021 175# 06/13/2021 174# - covid end emay 2022 08/14/2021 172#  O2 use _0  ra ra Uses o2 with exercise. Mowing yard 2L rest, 3L exertion  2Shortness of Breath 0 -> 5 scale with 5 being worst (score 6 If unable to do)  2At rest 1 1 0 _0 Simple tasks - showers, clothes change, eating, shaving _1 Household (dishes, doing bed, laundry) _2 Shopping _3 Walking level at own pace _4 Walking up Stairs _5 Total (40 - 48) Dyspnea Score _6 couyg 3 Xx - due to recent pred _7 Did not answer  How bad is your fatigue 3 x _8 0 5  nausea   0 0 0 0 0 0 0  vomit   0 0 0 0 0 0 0  diarrhea   _9 anxiety   0 0 0 0 0 0 1  deopresso   0 0 0 0 0 0 0       Simple office walk 185 feet x  3 laps goal with forehead probe 09/19/2018  01/12/2019  02/16/2019  06/08/2019  04/11/2020  06/11/2020  11/12/2020  01/24/2021  03/11/2021  08/14/2021   O2 used Room air Room air Room air Room air  ra ra ra ra 2L [place]  Number laps completed _10 of 2 laps only .f  Comments about pace normal normal normal normal  Mod pace avg [ace Slow pace    Resting Pulse Ox/HR 100% and 73/min 100% ad 74/min 100% and 68/min 98% and 82/min 99% and 60/min 98% and 54 98% and 73/min 94% and 88/min 97% RA at rest 97% and HR 85  Final Pulse Ox/HR 93% and 85/min 92% and 84/min 93% and 81/min 91% and 96/mni 91% and 74/mm 90% and 71/min 90% and 86/min 87% and 95/min desats to 87% on 2nd lap 93% ad HR 92 aat 1 lap and felt weak  Desaturated </= 88% no no no no yes yes yes yes    Desaturated <= 3% points Yes, 7 poin Yes, 8 ponts Yes, 7 points Yes, 7 points Yes, 8point Yes, 8 point Yes, 8 points Yes, 7 points    Got Tachycardic >/=  90/min no no no yes        Symptoms at end of test x none none none dyspnea none None dyspnea dyspnea  weak  Miscellaneous comments x x stable stable but tachy for first time     Corrected with 3L [place] - just 1 lap. Not all 3 laps tested on 3L [place] Premature stopping wihtout desats      CT Chest data  No results found.    PFT  PFT Results Latest Ref Rng & Units 06/12/2021 03/11/2021 06/11/2020 04/11/2020 01/12/2019 10/13/2018 05/16/2018  FVC-Pre L 1.97 2.10 2.26 2.23 2.21 2.25 2.38  FVC-Predicted Pre % 50 53 57 56 56 57 60  FVC-Post L - - - - 2.25 - -  FVC-Predicted Post % - - - - 57 - -  Pre FEV1/FVC % % 89 86 88 89 87 86 88  Post FEV1/FCV % % - - - - 90 - -  FEV1-Pre L 1.74 1.81 2.00 1.99 1.93 1.93 2.09  FEV1-Predicted Pre % 62 64 70 69 67 67 73  FEV1-Post L - - - - 2.03 - -  DLCO uncorrected ml/min/mmHg 13.83 11.41 13.69 14.72 14.38 14.17 16.48  DLCO UNC% % 59 48 58 62 50 50 58  DLCO corrected ml/min/mmHg 13.83 11.51 13.69 14.76 - - -  DLCO COR %Predicted % 59 48 58 62 - - -  DLVA Predicted % 114 89 96 110 102 94 110  TLC L - - - - - - -  TLC % Predicted % - - - - - - -  RV % Predicted % - - - - - - -       has a past medical history of Allergy, Arthritis, Atrial flutter (HCC), Cataract, Dysrhythmia, GERD (gastroesophageal reflux disease), H/O hiatal hernia, History of kidney stones, Hyperlipidemia, Injury of right hand, Pulmonary fibrosis ([place]), Renal stone (06/2014), and Ulcer.   reports that he quit smoking about 42 years ago. His smoking use included cigarettes. He has a 16.00 pack-year smoking history. He has never used smokeless tobacco.  Past Surgical History:  Procedure Laterality Date   ATRIAL FLUTTER ABLATION N/A 04/08/2015   Procedure: ATRIAL FLUTTER ABLATION;  Surgeon: Evans Lance, MD;  Location[facility] CATH LAB;  Service: Cardiovascular;  Laterality: N/A;   CATARACT EXTRACTION, BILATERAL  2016   COLONOSCOPY  03/01/2017   per Dr. Loletha Carrow, clear, repeat in 5 yrs  (brother had colon cancer)    ESOPHAGOGASTRODUODENOSCOPY  2007   EXTRACORPOREAL SHOCK WAVE LITHOTRIPSY  06/17/2014   per Dr. Jeffie Pollock    HAND SURGERY Right    LUMBAR LAMINECTOMY/DECOMPRESSION MICRODISCECTOMY Left 06/26/2021   Procedure: MICRODISCECTOMY LUMBAR FOUR-LUMBAR FIVE;  Surgeon: Consuella Lose, MD;  Location: [place];  Service: Neurosurgery;  Laterality: Left;   LUNG BIOPSY Left 03/22/2013   Procedure: LUNG BIOPSY;  Surgeon: Melrose Nakayama, MD;  Location: [place];  Service: Thoracic;  Laterality: Left;   POLYPECTOMY     RIGHT HEART CATH N/A 02/21/2021   Procedure: RIGHT HEART CATH;  Surgeon: Jolaine Artist, MD;  Location: [place] CV LAB;  Service: Cardiovascular;  Laterality: N/A;   TONSILLECTOMY     UPPER GASTROINTESTINAL ENDOSCOPY     VIDEO ASSISTED THORACOSCOPY Left 03/22/2013   Procedure: VIDEO ASSISTED THORACOSCOPY;  Surgeon: Melrose Nakayama, MD;  Location: [place];  Service: Thoracic;  Laterality: Left;    No Known Allergies  Immunization History  Administered Date(s) Administered   Fluad Quad(high Dose 65+) 08/25/2019   Influenza Split 09/13/2013   Influenza, High Dose Seasonal PF 10/06/2016, 09/16/2017, 08/17/2018   Influenza-Unspecified 09/07/2014, 09/14/2015, 09/13/2020   PFIZER(Purple Top)SARS-COV-2 Vaccination 02/12/2020, 03/12/2020, 08/14/2020   Pneumococcal Conjugate-13 02/16/2017   Pneumococcal Polysaccharide-23 05/26/2013   Td 12/15/2007   Tdap 02/23/2018   Zoster Recombinat (Shingrix) 10/13/2018, 07/25/2019   Zoster, Live 02/19/2014    Family History  Problem Relation Age of Onset   Liver cancer Mother    Diabetes Sister    Lung cancer Sister    Heart disease Brother    Diabetes Brother    Colon cancer Brother    Diabetes Other    Cancer Other        prostate   Colon polyps Neg Hx    Esophageal cancer Neg Hx    Rectal cancer Neg Hx    Stomach cancer Neg Hx      Current Outpatient Medications:    chlorpheniramine-HYDROcodone  (TUSSIONEX PENNKINETIC ER) 10-8 MG/5ML SUER, Take 5 mLs by mouth 2 (two) times daily as needed for cough (PRN, cough, ipf, chronic cough, palliation)., Disp: 140 mL, Rfl: 0   diclofenac (VOLTAREN)  75 MG EC tablet, Take 1 tablet (75 mg total) by mouth 2 (two) times daily., Disp: 60 tablet, Rfl: 5   flecainide (TAMBOCOR) 50 MG tablet, Take 50 mg by mouth 3 (three) times daily., Disp: , Rfl:    fluticasone (FLONASE) 50 MCG/ACT nasal spray, Place 2 sprays into both nostrils daily as needed for allergies., Disp: , Rfl:    Nintedanib (OFEV) 150 MG CAPS, Take 1 capsule (150 mg total) by mouth 2 (two) times daily., Disp: , Rfl:    pantoprazole (PROTONIX) 40 MG tablet, TAKE 1 TABLET BY MOUTH TWICE A DAY, Disp: 180 tablet, Rfl: 0   predniSONE (DELTASONE) 5 MG tablet, Take 1 tablet (5 mg total) by mouth daily with breakfast., Disp: , Rfl:    zolpidem (AMBIEN) 5 MG tablet, Take 1 tablet (5 mg total) by mouth at bedtime as needed for sleep., Disp: , Rfl:   Current Facility-Administered Medications:    chlorpheniramine-HYDROcodone (TUSSIONEX) 10-8 MG/5ML suspension 5 mL, 5 mL, Oral, Q12H PRN, Brand Males, MD      Objective:   Vitals:   08/14/21 1335  BP: 120/64  Pulse: 72  Temp: 97.6 F (36.4 C)  TempSrc: Oral  SpO2: 99%  Weight: 172 lb 3.2 oz (78.1 kg)  Height: 5' 9" (1.753 m)    Estimated body mass index is 25.43 kg/m as calculated from the following:   Height as of this encounter: 5' 9" (1.753 m).   Weight as of this encounter: 172 lb 3.2 oz (78.1 kg).  _0 @  Filed Weights   08/14/21 1335  Weight: 172 lb 3.2 oz (78.1 kg)     Physical Exam   General: No distress. Look same Neuro: Alert and Oriented x 3. GCS 15. Speech normal Psych: Pleasant Resp:  Barrel Chest - no.  Wheeze - no, Crackles -yes same, No overt respiratory distress CVS: Normal heart sounds. Murmurs - no Ext: Stigmata of Connective Tissue Disease - no HEENT: Normal upper airway. PEERL +. No post nasal  drip        Assessment:       ICD-10-CM   1. IPF (idiopathic pulmonary fibrosis) ([place])  J84.112     2. Chronic cough  R05.3     3. Dyspnea on exertion  R06.00          Plan:     Patient Instructions  IPF (idiopathic pulmonary fibrosis) (HCC) Chronic cough Dyspnea on exertion - worse Fluctutating heart rate   -You are having dramatic decline in your shortness of breath and symptoms in the last 1 week following spinal surgery on 07/30/2021  -I do not know if this is because of worsening pulmonary fibrosis or a cardiac issue  Plan -Walk test on 3 L of oxygen today - I have written to Dr. Crissie Sickles to see if we can get a monitor placed on you -Do CBC, chemistry, liver function test, BNP, D-dimer and troponin today  -Based on this blood work we will order the appropriate CT scan and consider prednisone burst -Do echocardiogram in the next few days urgent -Do spirometry and DLCO in the next few days to several days   Follow-up - We will call with the results 08/15/2021 -Telephone visit next week with nurse practitioner to discuss progress -[name] face-to-face visit in 2 weeks 30-minute slot   PS: Later in the day the results came back all his blood work including D-dimer troponin BNP and basic labs are normal.  Therefore we will wait for echocardiogram  report.  We will also order a high-resolution CT chest  ( Level 05 visit: Estb 40-54 min   in  visit type: on-site physical face to visit  in total care time and counseling or/and coordination of care by this undersigned MD - Dr Brand Males. This includes one or more of the following on this same day 08/14/2021: pre-charting, chart review, note writing, documentation discussion of test results, diagnostic or treatment recommendations, prognosis, risks and benefits of management options, instructions, education, compliance or risk-factor reduction. It excludes time spent by the [place] or office staff in the care of the  patient. Actual time 27 min)    SIGNATURE    Dr. Brand Males, M.D., F.C.C.P,  Pulmonary and Critical Care Medicine Staff Physician, [place] Director - Interstitial Lung Disease  Program  Pulmonary [facility] [place], [place], [address]  Pager: [phone], If no answer or between  15:00h - 7:00h: call [phone] Telephone: [phone]  2:02 PM 08/14/2021

## 2021-08-14 NOTE — Patient Instructions (Addendum)
IPF (idiopathic pulmonary fibrosis) (HCC) Chronic cough Dyspnea on exertion - worse Fluctutating heart rate   -You are having dramatic decline in your shortness of breath and symptoms in the last 1 week following spinal surgery on 07/30/2021  -I do not know if this is because of worsening pulmonary fibrosis or a cardiac issue  Plan -Walk test on 3 L of oxygen today - I have written to Dr. Crissie Sickles to see if we can get a monitor placed on you -Do CBC, chemistry, liver function test, BNP, D-dimer and troponin today  -Based on this blood work we will order the appropriate CT scan and consider prednisone burst -Do echocardiogram in the next few days urgent -Do spirometry and DLCO in the next few days to several days   Follow-up - We will call with the results 08/15/2021 -Telephone visit next week with nurse practitioner to discuss progress -[name] face-to-face visit in 2 weeks 30-minute slot

## 2021-08-14 NOTE — Telephone Encounter (Signed)
HI Tanner [place] sees you later in the month.  He is telling me that 2 weeks ago he had spine surgery and in the last 1 week he is having worsening shortness of breath.  In fact it is significantly worse on the symptom score.  I suspect it could be his pulmonary fibrosis getting worse but he is also telling me that he is having intermittent bradycardia at heart rate and down to the 40s.  He is on flecainide.  Plan - Wondering if he needs a monitor at home before he sees you? =-I am going to get blood work and breathing test and CT scan and echo  THanks    SIGNATURE    Dr. Brand Males, M.D., F.C.C.P,  Pulmonary and Critical Care Medicine Staff Physician, [place] Director - Interstitial Lung Disease  Program  Pulmonary [place] at [place], [place], [address]  NPI Number:  NPI #[id number]  Pager: [phone], If no answer  -> Check AMION or Try [phone] Telephone (clinical office): [phone] Telephone (research): [phone]  1:56 PM 08/14/2021

## 2021-08-15 ENCOUNTER — Ambulatory Visit (HOSPITAL_COMMUNITY): Payer: PPO | Attending: Cardiology

## 2021-08-15 DIAGNOSIS — R0600 Dyspnea, unspecified: Secondary | ICD-10-CM | POA: Diagnosis not present

## 2021-08-15 DIAGNOSIS — R0609 Other forms of dyspnea: Secondary | ICD-10-CM

## 2021-08-15 DIAGNOSIS — J84112 Idiopathic pulmonary fibrosis: Secondary | ICD-10-CM

## 2021-08-15 LAB — ECHOCARDIOGRAM COMPLETE
Area-P 1/2: 1.73 cm2
MV M vel: 5.06 m/s
MV Peak grad: 102.4 mmHg
Radius: 0.6 cm
S' Lateral: 3.2 cm

## 2021-08-15 MED ORDER — PREDNISONE 10 MG PO TABS: ORAL_TABLET | ORAL | 0 refills | Status: DC

## 2021-08-15 NOTE — Telephone Encounter (Signed)
I called and spoke with patient regarding MR recs. Patient verbalized understanding and I sent in pred taper to preferred pharmacy and notified patient that HRCT has been ordered and one of our [facility] will be calling to schedule that. Nothing further needed

## 2021-08-15 NOTE — Telephone Encounter (Signed)
Spoke with pt and reviewed [name]. Pt agreed to HR CT and Prednisone brust. [name] please advise on instructions on prednisone. Pt's pharmacy is [facility] in [place].

## 2021-08-15 NOTE — Telephone Encounter (Signed)
Called and spoke with patient. He stated that his prednisone was short by 10 tabs. I advised him that I would send them in for him. He verbalized understanding.

## 2021-08-15 NOTE — Telephone Encounter (Signed)
  Please take Take prednisone 20m once daily x 3 days, then 372monce daily x 3 days, then 2034mnce daily x 3 days, then prednisone 15m68mce daily  x 3 days and then back to baseline 5mg 12m day

## 2021-08-15 NOTE — Progress Notes (Signed)
No change in echocardiogram compared to past.  Please let him know

## 2021-08-19 ENCOUNTER — Telehealth: Payer: Self-pay | Admitting: Internal Medicine

## 2021-08-19 ENCOUNTER — Encounter (HOSPITAL_BASED_OUTPATIENT_CLINIC_OR_DEPARTMENT_OTHER): Payer: Self-pay | Admitting: Orthopaedic Surgery

## 2021-08-19 ENCOUNTER — Other Ambulatory Visit: Payer: Self-pay

## 2021-08-19 ENCOUNTER — Ambulatory Visit (HOSPITAL_BASED_OUTPATIENT_CLINIC_OR_DEPARTMENT_OTHER): Payer: PPO | Admitting: Orthopaedic Surgery

## 2021-08-19 ENCOUNTER — Ambulatory Visit (HOSPITAL_BASED_OUTPATIENT_CLINIC_OR_DEPARTMENT_OTHER)
Admission: RE | Admit: 2021-08-19 | Discharge: 2021-08-19 | Disposition: A | Discharge status: Home / Self Care | Payer: PPO | Source: Ambulatory Visit | Attending: Orthopaedic Surgery | Admitting: Orthopaedic Surgery

## 2021-08-19 VITALS — BP 144/73 | Ht 66.0 in | Wt 168.6 lb

## 2021-08-19 DIAGNOSIS — M25559 Pain in unspecified hip: Secondary | ICD-10-CM | POA: Diagnosis not present

## 2021-08-19 DIAGNOSIS — I480 Paroxysmal atrial fibrillation: Secondary | ICD-10-CM

## 2021-08-19 DIAGNOSIS — M25552 Pain in left hip: Secondary | ICD-10-CM | POA: Diagnosis not present

## 2021-08-19 DIAGNOSIS — M1612 Unilateral primary osteoarthritis, left hip: Secondary | ICD-10-CM | POA: Diagnosis not present

## 2021-08-19 MED ORDER — LIDOCAINE HCL 1 % IJ SOLN
4.0000 mL | INTRAMUSCULAR | Status: AC | PRN
  Administered 2021-08-19: 4 mL

## 2021-08-19 MED ORDER — PREDNISONE 10 MG PO TABS: ORAL_TABLET | ORAL | 0 refills | Status: DC

## 2021-08-19 MED ORDER — TRIAMCINOLONE ACETONIDE 40 MG/ML IJ SUSP
80.0000 mg | INTRAMUSCULAR | Status: AC | PRN
  Administered 2021-08-19: 80 mg via INTRA_ARTICULAR

## 2021-08-19 NOTE — Telephone Encounter (Signed)
Called [facility] and was placed on a long hold  Called pt- they will not honor his 10 extra pred tablets due to insurance issue  Per pt, [facility] said they would override this if we send in whole new rx  I have sent this to pt's preferred pharm  Nothing further needed

## 2021-08-19 NOTE — Telephone Encounter (Signed)
Called patient back about message. Patient stated his chest gets tight when he gets SOB. Patient stated that [name] told him he needs to follow up with [name] that this might be a heart issue. Patient wears oxygen at night and when he is doing activities. Patient stated he had an echo recently. Informed patient that per [name], he had no changes in echocardiogram compared to the past echocardiogram. Patient stated he has a CT test scheduled tomorrow. Informed patient that it sounds like this is more of a lung issue, but a message would be sent to [name] for advisement.

## 2021-08-19 NOTE — Telephone Encounter (Signed)
Left VM for pt that Dr. Golden Pop office has ordered the echo and the high res chest Ct that is scheduled for tomorrow and he should contact that office for results.

## 2021-08-19 NOTE — Telephone Encounter (Signed)
Dasia from [employer] new RX for Prednisone needs to be sent to Pharmacy. Pharmacy is [facility]. [place] phone number is [phone].

## 2021-08-19 NOTE — Progress Notes (Signed)
Chief Complaint: Left hip pain     History of Present Illness:   Pain Score: 9/10 SANE: 20/100  Tanner Bishop is a 75 y.o. male with left hip and buttock pain now for several months.  He states that he did have a lumbar spine surgery done in July 2022 after which he had no pain whatsoever in the leg.  He states he has been more active after that surgery and now experiences pain and what he describes as the buttock region.  This pain does not radiate down the leg.  He describes the pain as sore and that ultimately resting relieves the pain.  He is very active during the day.  He has been taking NSAIDs which helped somewhat for the pain.  He also endorses popping and clicking in the hip.  He has not had any previous injections.  He is scheduled to see a spine surgeon the following day    Surgical History:   No previous hip surgery  PMH/PSH/Family History/Social History/Meds/Allergies:    Past Medical History:  Diagnosis Date  . Allergy   . Arthritis   . Atrial flutter ([place])    a. s/p ablation 03/2015 Dr Lovena Le  . Cataract    removed both eyes  . Dysrhythmia   . GERD (gastroesophageal reflux disease)   . H/O hiatal hernia   . History of kidney stones   . Hyperlipidemia   . Injury of right hand    permanent damage after workplace 2009 and 2010 Dr Vernona Rieger [facility]  . Pulmonary fibrosis ([place])    sees Dr. Onnie Graham   . Renal stone 06/2014  . Ulcer    unresolved   Past Surgical History:  Procedure Laterality Date  . ATRIAL FLUTTER ABLATION N/A 04/08/2015   Procedure: ATRIAL FLUTTER ABLATION;  Surgeon: Evans Lance, MD;  [facility] CATH LAB;  Service: Cardiovascular;  Laterality: N/A;  . CATARACT EXTRACTION, BILATERAL  2016  . COLONOSCOPY  03/01/2017   per Dr. Loletha Carrow, clear, repeat in 5 yrs (brother had colon cancer)   . ESOPHAGOGASTRODUODENOSCOPY  2007  . EXTRACORPOREAL SHOCK WAVE LITHOTRIPSY  06/17/2014   per Dr. Jeffie Pollock    . HAND SURGERY Right   . LUMBAR LAMINECTOMY/DECOMPRESSION MICRODISCECTOMY Left 06/26/2021   Procedure: MICRODISCECTOMY LUMBAR FOUR-LUMBAR FIVE;  Surgeon: Consuella Lose, MD;  Location: [place];  Service: Neurosurgery;  Laterality: Left;  . LUNG BIOPSY Left 03/22/2013   Procedure: LUNG BIOPSY;  Surgeon: Melrose Nakayama, MD;  Location: [place];  Service: Thoracic;  Laterality: Left;  . POLYPECTOMY    . RIGHT HEART CATH N/A 02/21/2021   Procedure: RIGHT HEART CATH;  Surgeon: Jolaine Artist, MD;  Location: [place] CV LAB;  Service: Cardiovascular;  Laterality: N/A;  . TONSILLECTOMY    . UPPER GASTROINTESTINAL ENDOSCOPY    . VIDEO ASSISTED THORACOSCOPY Left 03/22/2013   Procedure: VIDEO ASSISTED THORACOSCOPY;  Surgeon: Melrose Nakayama, MD;  Location: [place];  Service: Thoracic;  Laterality: Left;   Social History   Socioeconomic History  . Marital status: Married    Spouse name: Not on file  . Number of children: Not on file  . Years of education: Not on file  . Highest education level: Not on file  Occupational History  . Occupation: LABORER    Fish farm manager: Sports coach  [employer]  . Occupation: Disabled   Tobacco Use  . Smoking status: Former    Packs/day: 1.00    Years: 16.00    Pack years: 16.00    Types: Cigarettes    Quit date: 12/14/1978    Years since quitting: 42.7  . Smokeless tobacco: Never  Vaping Use  . Vaping Use: Never used  Substance and Sexual Activity  . Alcohol use: No    Alcohol/week: 0.0 standard drinks  . Drug use: No  . Sexual activity: Not on file  Other Topics Concern  . Not on file  Social History Narrative  . Not on file   Social Determinants of Health   Financial Resource Strain: Low Risk   . Difficulty of Paying Living Expenses: Not hard at all  Food Insecurity: No Food Insecurity  . Worried About Charity fundraiser in the Last Year: Never true  . Ran Out of Food in the Last Year: Never true  Transportation Needs: No Transportation  Needs  . Lack of Transportation (Medical): No  . Lack of Transportation (Non-Medical): No  Physical Activity: Sufficiently Active  . Days of Exercise per Week: 7 days  . Minutes of Exercise per Session: 50 min  Stress: No Stress Concern Present  . Feeling of Stress : Not at all  Social Connections: Socially Integrated  . Frequency of Communication with Friends and Family: More than three times a week  . Frequency of Social Gatherings with Friends and Family: More than three times a week  . Attends Religious Services: More than 4 times per year  . Active Member of Clubs or Organizations: Yes  . Attends Archivist Meetings: More than 4 times per year  . Marital Status: Married   Family History  Problem Relation Age of Onset  . Liver cancer Mother   . Diabetes Sister   . Lung cancer Sister   . Heart disease Brother   . Diabetes Brother   . Colon cancer Brother   . Diabetes Other   . Cancer Other        prostate  . Colon polyps Neg Hx   . Esophageal cancer Neg Hx   . Rectal cancer Neg Hx   . Stomach cancer Neg Hx    No Known Allergies Current Outpatient Medications  Medication Sig Dispense Refill  . chlorpheniramine-HYDROcodone (TUSSIONEX PENNKINETIC ER) 10-8 MG/5ML SUER Take 5 mLs by mouth 2 (two) times daily as needed for cough (PRN, cough, ipf, chronic cough, palliation). 140 mL 0  . diclofenac (VOLTAREN) 75 MG EC tablet Take 1 tablet (75 mg total) by mouth 2 (two) times daily. 60 tablet 5  . flecainide (TAMBOCOR) 50 MG tablet Take 50 mg by mouth 3 (three) times daily.    . fluticasone (FLONASE) 50 MCG/ACT nasal spray Place 2 sprays into both nostrils daily as needed for allergies.    . Nintedanib (OFEV) 150 MG CAPS Take 1 capsule (150 mg total) by mouth 2 (two) times daily.    . pantoprazole (PROTONIX) 40 MG tablet TAKE 1 TABLET BY MOUTH TWICE A DAY 180 tablet 0  . predniSONE (DELTASONE) 10 MG tablet Take 76m daily for 3 days, then 348mdaily for 3 days, then 2035mdaily for 3 days, then prednisone 68m6mily for 3 days and then back to baseline 5mg 32m day. 20 tablet 0  . predniSONE (DELTASONE) 10 MG tablet Take as instructed 10 tablet 0  . predniSONE (DELTASONE) 5 MG tablet Take 1 tablet (  5 mg total) by mouth daily with breakfast.    . zolpidem (AMBIEN) 5 MG tablet Take 1 tablet (5 mg total) by mouth at bedtime as needed for sleep.     Current Facility-Administered Medications  Medication Dose Route Frequency Provider Last Rate Last Admin  . chlorpheniramine-HYDROcodone (TUSSIONEX) 10-8 MG/5ML suspension 5 mL  5 mL Oral Q12H PRN Brand Males, MD       No results found.  Review of Systems:   A ROS was performed including pertinent positives and negatives as documented in the HPI.  Physical Exam :   Constitutional: NAD and appears stated age Neurological: Alert and oriented Psych: Appropriate affect and cooperative Blood pressure (!) 144/73, height 5' 6" (1.676 m), weight 168 lb 9.6 oz (76.5 kg).   Comprehensive Musculoskeletal Exam:    Inspection Right Left  Skin No atrophy or gross abnormalities appreciated No atrophy or gross abnormalities appreciated  Palpation    Tenderness None None  Crepitus None None  Range of Motion    Flexion (passive) 120 120  Extension 30 30  IR 30 30 with pain  ER 40 40  Strength    Flexion  5/5 5/5  Extension 5/5 5/5  Special Tests    FABIR Negative Negative  FADER Negative Negative  ER Lag/Capsular Insufficiency Negative Negative  Instability Negative Negative  Sacroiliac pain Negative  Negative   Instability    Generalized Laxity No No  Neurologic    sciatic, femoral, obturator nerves intact to light sensation  Vascular/Lymphatic    DP pulse 2+ 2+  Lumbar Exam    Patient has symmetric lumbar range of motion with negative pain referral to hip     Imaging:   Xray (left hip 3 views): There is moderate osteoarthritis about left hip  I personally reviewed and interpreted the  radiographs.   Assessment:   75 year old male with left hip osteoarthritis which has become more symptomatic as he is recovering from his back surgery and being more active.  He states that he has a vacation planned for the next week and in that regard would like some form of pain relief.  I have offered him an ultrasound-guided injection of the left hip which she would like to proceed  Plan :    -Left hip ultrasound-guided injection -He will follow-up on an as-needed basis     Procedure Note  Patient: Tanner Bishop             Date of Birth: 12-May-1946           MRN: [id number]             Visit Date: 08/19/2021  Procedures: Visit Diagnoses:  1. Hip pain   2. Unilateral primary osteoarthritis, left hip     Large Joint Inj: L hip joint on 08/19/2021 12:58 PM Indications: pain Details: 22 G 3.5 in needle, ultrasound-guided anterolateral approach  Arthrogram: No  Medications: 4 mL lidocaine 1 %; 80 mg triamcinolone acetonide 40 MG/ML Outcome: tolerated well, no immediate complications Procedure, treatment alternatives, risks and benefits explained, specific risks discussed. Consent was given by the patient. Immediately prior to procedure a time out was called to verify the correct patient, procedure, equipment, support staff and site/side marked as required. Patient was prepped and draped in the usual sterile fashion.       I personally saw and evaluated the patient, and participated in the management and treatment plan.  Vanetta Mulders, MD Attending Physician, Orthopedic Surgery  This document was  dictated using Systems analyst. A reasonable attempt at proof reading has been made to minimize errors.

## 2021-08-19 NOTE — Telephone Encounter (Signed)
Patient said he would like someone from the office to call him and go over his test results. He had an Echo 08/15/21 and has a CT scheduled for 08/20/21.  He is going out of town 08/25/21 and is worried about not having the results before he leaves.  Please call with both test results.

## 2021-08-19 NOTE — Telephone Encounter (Signed)
Pt c/o Shortness Of Breath: STAT if SOB developed within the last 24 hours or pt is noticeably SOB on the phone  1. Are you currently SOB (can you hear that pt is SOB on the phone)? Yes, can hear over the phone   2. How long have you been experiencing SOB?  About a week   3. Are you SOB when sitting or when up moving around? Moving around  4. Are you currently experiencing any other symptoms? Fatigue   Jaja is calling stating his pulmonary Doctor ran a test on him for his SOB and advised him they believe it is heart related. He is on oxygen and is unable to climb the stairs even while wearing it due to SOB. Can hear over the phone has developed in the past week. Supposed to be leaving for vacation next week, but wants to try to get answers prior to going. Please advise.

## 2021-08-20 ENCOUNTER — Ambulatory Visit (INDEPENDENT_AMBULATORY_CARE_PROVIDER_SITE_OTHER)
Admission: RE | Admit: 2021-08-20 | Discharge: 2021-08-20 | Disposition: A | Discharge status: Home / Self Care | Payer: PPO | Source: Ambulatory Visit | Attending: Internal Medicine | Admitting: Internal Medicine

## 2021-08-20 ENCOUNTER — Ambulatory Visit (INDEPENDENT_AMBULATORY_CARE_PROVIDER_SITE_OTHER): Payer: PPO

## 2021-08-20 DIAGNOSIS — J189 Pneumonia, unspecified organism: Secondary | ICD-10-CM | POA: Diagnosis not present

## 2021-08-20 DIAGNOSIS — I700 Atherosclerosis of aorta: Secondary | ICD-10-CM | POA: Diagnosis not present

## 2021-08-20 DIAGNOSIS — J8410 Pulmonary fibrosis, unspecified: Secondary | ICD-10-CM

## 2021-08-20 DIAGNOSIS — J479 Bronchiectasis, uncomplicated: Secondary | ICD-10-CM | POA: Diagnosis not present

## 2021-08-20 DIAGNOSIS — I480 Paroxysmal atrial fibrillation: Secondary | ICD-10-CM

## 2021-08-20 NOTE — Telephone Encounter (Signed)
Per review of note by Dr. R-Dr. Alfonso Patten would like Pt to wear a heart monitor to see if SOB is related to rhythm or rate.  Discussed with [name].  Advised to order 14 day HM.  Pt states when he wakes up his heart rate is sometimes in the 30's and then it will increase.  States highest heart rate is 80-90.  Pt has CT today.

## 2021-08-21 NOTE — Telephone Encounter (Signed)
Pt has appointment with pulmonary tomorrow 9/9 to discuss results of chest CT.  Will monitor.

## 2021-08-21 NOTE — Telephone Encounter (Signed)
Patient is following up, requesting to speak with Dr. Tanna Furry nurse again. He states he still has concerns with the SOB because he is going out of town soon and he is hopefully that his symptoms will be resolved before he leaves.

## 2021-08-22 ENCOUNTER — Encounter: Payer: Self-pay | Admitting: Primary Care

## 2021-08-22 ENCOUNTER — Ambulatory Visit (INDEPENDENT_AMBULATORY_CARE_PROVIDER_SITE_OTHER): Payer: PPO | Admitting: Primary Care

## 2021-08-22 ENCOUNTER — Other Ambulatory Visit: Payer: Self-pay

## 2021-08-22 VITALS — HR 93

## 2021-08-22 DIAGNOSIS — J84112 Idiopathic pulmonary fibrosis: Secondary | ICD-10-CM | POA: Diagnosis not present

## 2021-08-22 MED ORDER — ALBUTEROL SULFATE HFA 108 (90 BASE) MCG/ACT IN AERS: 1.0000 | INHALATION_SPRAY | Freq: Four times a day (QID) | RESPIRATORY_TRACT | 1 refills | Status: DC | PRN

## 2021-08-22 MED ORDER — PREDNISONE 10 MG PO TABS: ORAL_TABLET | ORAL | 0 refills | Status: DC

## 2021-08-22 NOTE — Progress Notes (Signed)
Virtual Visit via Telephone Note  I connected with Tanner Bishop on 08/22/21 at  4:30 PM EDT by telephone and verified that I am speaking with the correct person using two identifiers.  Location: Patient: Home Provider: Office   I discussed the limitations, risks, security and privacy concerns of performing an evaluation and management service by telephone and the availability of in person appointments. I also discussed with the patient that there may be a patient responsible charge related to this service. The patient expressed understanding and agreed to proceed.   History of Present Illness:  75 year old male, former smoker quit in [place] (16 pack year hx). PMH significant for IPF, aflutter, GERD, dysphagia, chronic cough, HLD. Patient of Dr. Brantley Persons. HRCT showed only mild progression of his IPF in three years. Maintained on ofev 150 twice daily and prednisone 90m daily.    Previous [facility] pulmonary encounter: 08/14/2021 -   Chief Complaint  Patient presents with   Follow-up    Pt states that his breathing has become worse since last visit.      HPI Tanner Bishop 75y.o. -returns for follow-up.  Approximately 2 weeks ago on 07/30/2021 he had a spinal surgery.  Surgery went well.  After that the back pain is improved and he sustained improvement.  His effort tolerance then improved but in the last 1 week he is progressively more short of breath.  He says he uses 2 L of nasal cannula oxygen at rest and 3 L with exertion but he states he is unable to do the things he used to even a week ago.  There is no associated worsening edema.  No orthopnea no paroxysmal nocturnal dyspnea.  No chest pain.  No hemoptysis no fever no chills no worsening cough.  He is taking his prednisone and nintedanib correctly.  He says the shortness of breath is so worse that he worries that he is going to die pretty soon.  His weight is stable.  He is unclear if he is desaturating but he did notice that he is having  fluctuating heart rate.  Intermittently have significant bradycardia to 40.  His ILD symptom score shows significant worsening.   08/22/2021 Interim hx  Patient contacted today for 1 week follow-up. He saw Dr. RChase Callerlast week for worsening shortness of breath. He had mild case of covid-19 in May that did not require hospitalization. He had spinal surgery on 07/30/21. D-dimer and troponin were negative. Unclear if pulmonary fibrosis or cardiac disease. He noticed very slight improvement with prednisone taper but still has pretty severe dyspnea symptoms. He is taking OFEV 1522mtablet twice day. He has a rare cough at night which resolves with tussionex. He is on 2L oxygen with exertion and at night. O2 95% RA at rest. HRCT imaging showed only mild progression of his underlying ILD. He has an apt for pulmonary function testing in October.    He reports that his HR fluctuates between 40-109. He is in the process of getting a holter monitor. He has an apt with cardiology the end of this month. Denies chest discomfort or pain.    SYMPTOM SCALE - ILD 02/16/2019  06/08/2019  04/11/2020  06/11/2020  11/12/2020 169# weight 01/24/2021 171# 03/11/2021 175# 06/13/2021 174# - covid end emay 2022 08/14/2021 172# 08/22/2021   O2 use _0  ra ra Uses o2 with exercise. Mowing yard 2L rest, 3L exertion RA at rest; 2L with exertion   2 0 -> 5  scale with 5 being worst (score 6 If unable to do)           2At rest _0  Simple tasks - showers, clothes change, eating, shaving _1 Household (dishes, doing bed, laundry) _2 Shopping _3 Walking level at own pace _4 Walking up Stairs _5 Total (40 - 48) Dyspnea Score _6 couyg 3 Xx - due to recent pred _7 Did not answer 1  How bad is your fatigue 3 x _8 0 5 5  nausea   0 0 0 0 0 0 0 0  vomit   0 0 0 0 0 0 0 0  diarrhea    _9 anxiety   0 0 0 0 0 0 1 0  deopresso   0 0 0 0 0 0 0 0       Observations/Objective:  - Able to speak in mostly full sentences; no overt wheezing, rare cough  - Patient reported O2 95% RA at rest, HR 88  Testing: Echocardiogram on 08/15/2021 showed left ventricular ejection fraction by 3D volume is 59%.  Mild left ventricular hypertrophy.  Left ventricular diastolic parameters are consistent with grade 1 diastolic dysfunction.  Mildly elevated pulmonary artery systolic pressure.  HRCT on 08/20/2021 showed minimal progression of ILD.  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  Mild cardiomegaly.  Labs- LFT normal, d-dimer negative, Trop negative, BNP normal  Normal right heart catheterization 02/21/2021 by Dr. Glori Bickers.    - Mean pressure 17.  Wedge pressure 6.  Cardiac index 2.6.  PVR 2.1  Assessment and Plan:  IPF: - Patient reports decline in shortness of breath last 1-2 weeks, no significant improvement with oral prednisone. He had mild progression of his ILD on most recent imaging. His d-dimer and trop were negative. He is not anemic. It is unlikely that IPF is causing acute change in his respiratory status. Concern he may be in afib. His heart rate appears to be fluctuating but he has not noted rapid ventricular rate. He is having a holter monitor placed shortly and will be following up with cardiology end of the month. Repeat pulmonary function testing is scheduled for October. Rx prednisone taper to have on hand.   Follow Up Instructions:  - October for PFTs and FU with [name]     I discussed the assessment and treatment plan with the patient. The patient was provided an opportunity to ask questions and all were answered. The patient agreed with the plan and demonstrated an understanding of the instructions.   The patient was advised to call back or seek an in-person evaluation if the symptoms worsen or  if the condition fails to improve as anticipated.  I provided 40 minutes of non-face-to-face time during this encounter.   Martyn Ehrich, NP

## 2021-08-22 NOTE — Patient Instructions (Signed)
Prednisone taper to have on hand No changes Follow up with cardiology PFTs in October with [name]

## 2021-08-25 NOTE — Telephone Encounter (Signed)
noted 

## 2021-08-26 DIAGNOSIS — J8410 Pulmonary fibrosis, unspecified: Secondary | ICD-10-CM | POA: Diagnosis not present

## 2021-08-29 DIAGNOSIS — I480 Paroxysmal atrial fibrillation: Secondary | ICD-10-CM | POA: Diagnosis not present

## 2021-09-03 ENCOUNTER — Ambulatory Visit: Payer: PPO | Admitting: Internal Medicine

## 2021-09-03 ENCOUNTER — Other Ambulatory Visit: Payer: Self-pay

## 2021-09-03 ENCOUNTER — Encounter: Payer: Self-pay | Admitting: Internal Medicine

## 2021-09-03 VITALS — BP 140/82 | HR 63 | Ht 68.0 in | Wt 167.8 lb

## 2021-09-03 DIAGNOSIS — I480 Paroxysmal atrial fibrillation: Secondary | ICD-10-CM | POA: Diagnosis not present

## 2021-09-03 MED ORDER — FLECAINIDE ACETATE 50 MG PO TABS: 50.0000 mg | ORAL_TABLET | Freq: Three times a day (TID) | ORAL | 6 refills | Status: AC

## 2021-09-03 NOTE — Progress Notes (Signed)
HPI [name] returns today for followup of his atrial fib. He is a pleasant 75 yo man with a h/o pulmonary fibrosis, PAF who has been well controlled on flecainide. He denies chest pain. No syncope. No palpitations. He is now on supplemental oxygen and notes that his dyspnea has worsened over the past month.He denies edema. He is pending the results of a heart monitor. His 2D echo showed mild RV dysfunction.  No Known Allergies   Current Outpatient Medications  Medication Sig Dispense Refill   albuterol (VENTOLIN HFA) 108 (90 Base) MCG/ACT inhaler Inhale 1-2 puffs into the lungs every 6 (six) hours as needed for wheezing or shortness of breath. 18 g 1   chlorpheniramine-HYDROcodone (TUSSIONEX PENNKINETIC ER) 10-8 MG/5ML SUER Take 5 mLs by mouth 2 (two) times daily as needed for cough (PRN, cough, ipf, chronic cough, palliation). 140 mL 0   flecainide (TAMBOCOR) 50 MG tablet Take 50 mg by mouth 3 (three) times daily.     fluticasone (FLONASE) 50 MCG/ACT nasal spray Place 2 sprays into both nostrils daily as needed for allergies.     Nintedanib (OFEV) 150 MG CAPS Take 1 capsule (150 mg total) by mouth 2 (two) times daily.     pantoprazole (PROTONIX) 40 MG tablet TAKE 1 TABLET BY MOUTH TWICE A DAY 180 tablet 0   predniSONE (DELTASONE) 10 MG tablet 4 x 3 days, 3 x 3 days, 2 x 3 days, 1 x 3 days then resume 5 mg maintenance dose 30 tablet 0   predniSONE (DELTASONE) 5 MG tablet Take 1 tablet (5 mg total) by mouth daily with breakfast.     zolpidem (AMBIEN) 5 MG tablet Take 1 tablet (5 mg total) by mouth at bedtime as needed for sleep.     diclofenac (VOLTAREN) 75 MG EC tablet Take 1 tablet (75 mg total) by mouth 2 (two) times daily. (Patient not taking: Reported on 09/03/2021) 60 tablet 5   Current Facility-Administered Medications  Medication Dose Route Frequency Provider Last Rate Last Admin   chlorpheniramine-HYDROcodone (TUSSIONEX) 10-8 MG/5ML suspension 5 mL  5 mL Oral Q12H PRN  Brand Males, MD         Past Medical History:  Diagnosis Date   Allergy    Arthritis    Atrial flutter [facility])    a. s/p ablation 03/2015 Dr Lovena Le   Cataract    removed both eyes   Dysrhythmia    GERD (gastroesophageal reflux disease)    H/O hiatal hernia    History of kidney stones    Hyperlipidemia    Injury of right hand    permanent damage after workplace 2009 and 2010 Dr [facility]   Pulmonary fibrosis [facility])    sees Dr. Onnie Graham    Renal stone 06/2014   Ulcer    unresolved    ROS:   All systems reviewed and negative except as noted in the HPI.   Past Surgical History:  Procedure Laterality Date   ATRIAL FLUTTER ABLATION N/A 04/08/2015   Procedure: ATRIAL FLUTTER ABLATION;  Surgeon: Evans Lance, MD;  Location: [facility] CATH LAB;  Service: Cardiovascular;  Laterality: N/A;   CATARACT EXTRACTION, BILATERAL  2016   COLONOSCOPY  03/01/2017   per Dr. Loletha Carrow, clear, repeat in 5 yrs (brother had colon cancer)    ESOPHAGOGASTRODUODENOSCOPY  2007   EXTRACORPOREAL SHOCK WAVE LITHOTRIPSY  06/17/2014   per Dr. Jeffie Pollock    HAND SURGERY Right    LUMBAR LAMINECTOMY/DECOMPRESSION  MICRODISCECTOMY Left 06/26/2021   Procedure: MICRODISCECTOMY LUMBAR FOUR-LUMBAR FIVE;  Surgeon: Consuella Lose, MD;  Location: [place];  Service: Neurosurgery;  Laterality: Left;   LUNG BIOPSY Left 03/22/2013   Procedure: LUNG BIOPSY;  Surgeon: Melrose Nakayama, MD;  Location: [place];  Service: Thoracic;  Laterality: Left;   POLYPECTOMY     RIGHT HEART CATH N/A 02/21/2021   Procedure: RIGHT HEART CATH;  Surgeon: Jolaine Artist, MD;  Location: [place] CV LAB;  Service: Cardiovascular;  Laterality: N/A;   TONSILLECTOMY     UPPER GASTROINTESTINAL ENDOSCOPY     VIDEO ASSISTED THORACOSCOPY Left 03/22/2013   Procedure: VIDEO ASSISTED THORACOSCOPY;  Surgeon: Melrose Nakayama, MD;  Location: [place];  Service: Thoracic;  Laterality: Left;     Family History  Problem  Relation Age of Onset   Liver cancer Mother    Diabetes Sister    Lung cancer Sister    Heart disease Brother    Diabetes Brother    Colon cancer Brother    Diabetes Other    Cancer Other        prostate   Colon polyps Neg Hx    Esophageal cancer Neg Hx    Rectal cancer Neg Hx    Stomach cancer Neg Hx      Social History   Socioeconomic History   Marital status: Married    Spouse name: Not on file   Number of children: Not on file   Years of education: Not on file   Highest education level: Not on file  Occupational History   Occupation: LABORER    Employer: Chemical engineer   Occupation: Disabled   Tobacco Use   Smoking status: Former    Packs/day: 1.00    Years: 16.00    Pack years: 16.00    Types: Cigarettes    Quit date: 12/14/1978    Years since quitting: 42.7   Smokeless tobacco: Never  Vaping Use   Vaping Use: Never used  Substance and Sexual Activity   Alcohol use: No    Alcohol/week: 0.0 standard drinks   Drug use: No   Sexual activity: Not on file  Other Topics Concern   Not on file  Social History Narrative   Not on file   Social Determinants of Health   Financial Resource Strain: Low Risk    Difficulty of Paying Living Expenses: Not hard at all  Food Insecurity: No Food Insecurity   Worried About Charity fundraiser in the Last Year: Never true   [place] in the Last Year: Never true  Transportation Needs: No Transportation Needs   Lack of Transportation (Medical): No   Lack of Transportation (Non-Medical): No  Physical Activity: Sufficiently Active   Days of Exercise per Week: 7 days   Minutes of Exercise per Session: 50 min  Stress: No Stress Concern Present   Feeling of Stress : Not at all  Social Connections: Socially Integrated   Frequency of Communication with Friends and Family: More than three times a week   Frequency of Social Gatherings with Friends and Family: More than three times a week   Attends Religious Services:  More than 4 times per year   Active Member of [employer] or Organizations: Yes   Attends Music therapist: More than 4 times per year   Marital Status: Married  Human resources officer Violence: Not At Risk   Fear of Current or Ex-Partner: No   Emotionally Abused: No  Physically Abused: No   Sexually Abused: No     BP 140/82   Pulse 63   Ht _0  (1.727 m)   Wt 167 lb 12.8 oz (76.1 kg)   SpO2 98% Comment: with 2L 02  BMI 25.51 kg/m   Physical Exam:  Well appearing NAD HEENT: Unremarkable Neck:  No JVD, no thyromegally Lymphatics:  No adenopathy Back:  No CVA tenderness Lungs:  scattered rales and rhonchi HEART:  Regular rate rhythm, no murmurs, no rubs, no clicks Abd:  soft, positive bowel sounds, no organomegally, no rebound, no guarding Ext:  2 plus pulses, no edema, no cyanosis, no clubbing Skin:  No rashes no nodules Neuro:  CN II through XII intact, motor grossly intact  EKG - nsr with rbbb   Assess/Plan:  1. PAF - he will continue low dose flecainide 50 mg tid. He appears to be maintaining NSR. He is pending a cardiac monitor to evaluate.  2. Pulmonary fibrosis - he has worsened. He is under pulmonary management with Dr. Alfonso Patten. Today I had him walk with me in the office. He quickly became dyspneic and had leg pain. His pulse ox was 83% on 2 liters. It took about 5 minutes to return to 95%. I suspect his fibrosis is worsening.  3. HTN - his sbp is up a bit. No change in meds. 4. Atrial flutter - he has not had any flutter since his ablation 6 years ago.   Royston Sinner Gordy Goar,MD

## 2021-09-03 NOTE — Patient Instructions (Signed)
Medication Instructions: Your physician recommends that you continue on your current medications as directed. Please refer to the Current Medication list given to you today.  *If you need a refill on your cardiac medications before your next appointment, please call your pharmacy*   Lab Work: none If you have labs (blood work) drawn today and your tests are completely normal, you will receive your results only by: [place] (if you have MyChart) OR A paper copy in the mail If you have any lab test that is abnormal or we need to change your treatment, we will call you to review the results.   Testing/Procedures: none   Follow-Up: At [facility], you and your health needs are our priority.  As part of our continuing mission to provide you with exceptional heart care, we have created designated Provider Care Teams.  These Care Teams include your primary Cardiologist (physician) and Advanced Practice Providers (APPs -  Physician Assistants and Nurse Practitioners) who all work together to provide you with the care you need, when you need it.  We recommend signing up for the patient portal called "MyChart".  Sign up information is provided on this After Visit Summary.  MyChart is used to connect with patients for Virtual Visits (Telemedicine).  Patients are able to view lab/test results, encounter notes, upcoming appointments, etc.  Non-urgent messages can be sent to your provider as well.   To learn more about what you can do with MyChart, go to [email or web address].    Your next appointment:   6 month(s)  The format for your next appointment:   In Person  Provider:   Dr. Cristopher Peru    Other Instructions

## 2021-09-03 NOTE — Progress Notes (Signed)
g

## 2021-09-05 ENCOUNTER — Telehealth: Payer: Self-pay | Admitting: Internal Medicine

## 2021-09-05 NOTE — Telephone Encounter (Signed)
.  The stockings would help dilate blood vessels which would help circulation but these can be hard to get off and on .Pt aware and is going to the [place] [facility] facility to get measured and fitted./cy

## 2021-09-05 NOTE — Telephone Encounter (Signed)
Patient states at his last appointment [name] said the reason his legs and feet were aching and tingling was due to not enough oxygen. He would like to know if compression socks would help.

## 2021-09-08 ENCOUNTER — Telehealth: Payer: Self-pay | Admitting: Internal Medicine

## 2021-09-08 NOTE — Telephone Encounter (Signed)
Got a  call from [place] the support group leader last night. Patient Tanner Bishop reports orsening dyspnea. Apparently is bad. Recently in cardiology he was desaturating.   Plan  - can you get him in this week pleaes to see me first choice or APP (if app let me know so I can talk to APP) - keep mid oct appt  Thanks    SIGNATURE    Dr. Brand Males, M.D., F.C.C.P,  Pulmonary and Critical Care Medicine Staff Physician, [place] Director - Interstitial Lung Disease  Program  Pulmonary [place] [facility] [place], [place], [address]  NPI Number:  NPI #[id number]  Pager: [phone], If no answer  -> Check AMION or Try [phone] Telephone (clinical office): [phone] Telephone (research): [phone]  10:39 AM 09/08/2021

## 2021-09-08 NOTE — Telephone Encounter (Signed)
Call made to patient, confirmed DOB. Made aware of MR recommendations. Appt made.   Nothing further needed at this time.

## 2021-09-09 DIAGNOSIS — I480 Paroxysmal atrial fibrillation: Secondary | ICD-10-CM | POA: Diagnosis not present

## 2021-09-11 ENCOUNTER — Ambulatory Visit: Payer: PPO | Admitting: Internal Medicine

## 2021-09-11 ENCOUNTER — Encounter: Payer: Self-pay | Admitting: Internal Medicine

## 2021-09-11 ENCOUNTER — Telehealth: Payer: Self-pay | Admitting: Internal Medicine

## 2021-09-11 ENCOUNTER — Other Ambulatory Visit: Payer: Self-pay

## 2021-09-11 VITALS — BP 120/70 | HR 72 | Ht 68.0 in | Wt 168.4 lb

## 2021-09-11 DIAGNOSIS — J84112 Idiopathic pulmonary fibrosis: Secondary | ICD-10-CM

## 2021-09-11 DIAGNOSIS — J9611 Chronic respiratory failure with hypoxia: Secondary | ICD-10-CM | POA: Diagnosis not present

## 2021-09-11 NOTE — Progress Notes (Signed)
#IPF - uip path 03/22/13  - On OFEV since early May 2015   PFT FVC fev1 ratio BD fev1 TLC DLCO Walk test 151f x 3 laps wt rx             Spring 2014 2.9:L L/% /%        June 2015 2.7L/67% 2.34L/78% 86  3.68/57% 20.24/71%   ofev start  [name]  2016 2.5L/55% 2.1L/60% 84/108%    No desat, Pk HR 130    02/11/2015 Screening visit afferent cough study 2.59L/56% 2.24L/63% 87/112%    Dx with afluttter on ekg   Screen failed due to hx of stones  03/26/2015        No desat, PK HR 130    08/26/15 pft machine 2.73L/68% 2.38L/80% 87   [name]     06/29/2016 Office spiro 2.49L/56% 2.14L/65%     Pk HR 90 and lowest pulse ox 99% ->93%  ofev  11/02/2016  office full pft  2.61L/66% 2.3L/79%    17.23/60%   pfev  09/16/2017  2.53L/64%      Pulse ox 99% -> 93%, HR 81- > 84  ofev  12/22/2017        100% -> 97%,  HR 66 -> 96    09/19/2018        100% -> 93, HR 74 -> 85         OV 03/26/2015  Chief Complaint  Patient presents with   Follow-up    [name], dry cough. CATH procedure on 04/08/15. Denies any chest tightnes/congestion.    75year old male with idiopathic pulmonary fibrosis. He is on Ofev after having failed Esbriet in the past due to side effects. He is tolerating Ofev well. Liver function test in March 2016 was normal. I last saw him in January 2016. Subsequently in February 2016 we screened him for a cough idiopathic pulmonary fibrosis study called afferent but he screen failed due to history of renal stones. At this time he was incidentally diagnosed with new onset atrial flutter. He has seen Dr. GCrissie Sicklesand has been started on anticoagulation with Xarelto and metoprolol. He is due to undergo a ablation. He is not aware of the increased bleeding risk with Ofev in the setting of anticoagulation. He assures me that the anti-coag and is only until he undergoes ablation and after that the plan is to stop it. Therefore only short-term anticoagulation with Xarelto.  Overall his effort tolerance is fine. He has postponed his [facility] lung transplant until he completes his ablation.  Walking desaturation test in the office today he did not desaturate. This is stable. Spirometry not done   Past medical history: Atrial flutter new diagnoses as above   OV 06/29/2016  Chief Complaint  Patient presents with   Follow-up    pt states he is at baseline: sob with exertion, nonprod cough.      75year old male with idiopathic pulmonary fibrosis. He is on Ofev . He completed 6 months of PRAISE study (([id number] placebo) in l;ate winter/early spring 2017. [place] Overall stable. No worsening dyspnea and cough. Continues daily exercise is walking many miles. Some days he is sitting more than the others. He is interested in more trials and results of the praise study. These results are not out yet. He is compliant with his Ofe last liver function test was in February 2017. There are no new issues. He believes his  atrial fibrillation is in sinus rhythm;    OV  11/02/2016  Chief Complaint  Patient presents with   Follow-up    4 month IPF follow up and B&A review - does report increased prod cough with yellow mucus, wheezing, tightness in chest, increased SOB, x3-4 weeks with the weather change.  denies any f/c/s, hemoptysis, chest pain   IPF - on ofev. Last liver function test was in July 2017 and normal. Overall dyspnea stable. However he is been having diarrhea with ofev. He called in and we reduced it to 1 tablet a day and this improved the diarrhea. He back on 1 tablet twice a day. The diarrhea has returned although it is much milder. He has never taken any medication for this. Are function test shows slight improvement from July 2017 and similar levels to approximately one year ago  The new issue that he's having significant recurrent sinus infection in the back of chronic postnasal drainage. This since the fall 2017. He says that he and his wife catch  respirator infection and passive back and forth to each other. He is having cough, chest tightness, postnasal drip and yellow sputum. It is not affecting any dyspnea. There is no wheeze. There is no fever or hemoptysis.  OV 03/02/2017  Chief Complaint  Patient presents with   Follow-up    Pt states his breathing is at baseline. Pt c/o dry cough - pt states this is baseline. Pt denies CP/tightness.     Idiopathic pulmonary fibrosis on Ofev. Last seen November 2017. He is a routine follow-up.  He is here with his wife. His sinus infections a result. He is interested in research protocols but we have none right now. He is tolerating his Ofev associated with some mild diarrhea occasional which she takes medication. He is not much of a problem. He tells me that she walks 6 times a week for 5 miles a day. On the treadmill at a 5 incline walking at 3-1/2 miles an hour. For this he feels a little more dyspneic than before. Walking desaturation test 185 feet 3 laps on room air: His pulse ox dropped from 98% at rest and 93% with exertion. His heart rate jumped from 66 at rest and 97 at peak exertion. Features are suggestive of mild progression of the disease. He is not attending pulmonary fibrosis foundation because he felt this was negative but that was years ago. We discussed this and he might be interested again.     OV 09/16/2017  Chief Complaint  Patient presents with   Follow-up    PFT done today. Pt states that his breathing is gradually getting worse. SOB which depends if it is on exertion vs all the time and c/o prod cough with yellow mucus. Denies any CP   S IPF on fev followup. Overall doing well. He recent bronchitis and liked anabiotic and prednisone. He felt the prednisone helped his cough just currently rated as mild to moderate in severity. Helped the shortness of breath. He also helped arthralgia. He is now wondering what taking chronic prednisone. We had an extensive discussion about  this. Spirometry shows that the FVC stable compared to earlier this year but slightly progressive compared to one year ago. [facility] interstitial lung disease questionnaire shows symptoms. His wife is here with him. He is interested in research trials. He has participated in distress before. He is asking about opioid refill for cough if I wont do prednisone.   OV 12/22/2017  Chief  Complaint  Patient presents with   Follow-up    Pt states he believes his breathing is at a stable point right now. States that he is coughing which is worse when he first wakes up and then also at night; occ will cough up yellow phlegm.     C.o ofev intolerance with fatigue, weight loss, diarrhea. Does not like lomotil. Wnaant to give it a holiday foir 2-3 weeks. INterested in research protocol - discussed [place]. 5 year cut off since diagnosis coming up 03/22/18. He is overall frustrated ith qualtiy of life. Walks 5 miles; starts feeling dizzy > 3 miles in but not checking pulse ox despite advice. Walking desaturation test on 12/22/2017 185 feet x 3 laps on ROOM AIR:  did noit desaturate. Rest pulse ox was 100%, final pulse ox was 97%. HR response 66/min at rest to 96/min at peak exertion. Patient Tanner Bishop  Did not Desaturate < 88% . Tanner Bishop yes  Desaturated </= 3% points. Tanner Bishop yes did get tachyardic     Walking desaturation test on 02/01/2018 185 feet x 3 laps on ROOM AIR:  did not desaturate. Rest pulse ox was 100%, final pulse ox was 91%. HR response 67/min at rest to 85/min at peak exertion. Patient Tanner Bishop  dod not Desaturate < 88% . Tanner Bishop yes diod  Desaturated </= 3% points. Tanner Bishop did not get tachyardic   OV 02/01/2018  Chief Complaint  Patient presents with   Follow-up    Pt states he has been doing good since last visit. Started back on OFEV 12/24/17 after taking a holiday off of it.     FU IPF  Took ofev holiday. And then restarted 01/24/18 and doing well. [place]  ofev ok. KBILD ok.   OV 03/16/2018'  . Chief Complaint  Patient presents with   Follow-up    PFT done today. States he has been doing well and denies any current complaints. Tolerating OFEV well and has been walking about 5 miles every day with no complaints.   Ms. Julia returns for follow-up of idiopathic pulmonary fibrosis.  He is now back on nintedanib after giving it a holiday for a few weeks.  He is tolerating it well and no problems.  He goes for daily walks and has no problems.  His lung function was reviewed today and it shows for the first time a significant decline of greater than 200 cc between 2 time points.  This correlates with him not taking over for a few weeks nevertheless overall he is happy with his health.  He is not interested in research trials anymore.  He has a new question about travel advice encounter.  He plans an [place] from [place], [place] intra[place]junal in [place] for 10 days.  This will start on May 22, 2018.  He wants to make sure it is safe for him to go.  He said he has never been on a cruise and he and his wife have a strong desire to go as part of his goals of care.   OV 05/16/2018  Chief Complaint  Patient presents with   Follow-up    Breathing is unchanged since last OV.    Tanner Bishop , 75 y.o. , with dob Sep 20, 1946 and male ,Not Hispanic or Latino from [address] [place] [address] - presents to pulm ILD clinic for IPF.  He presents with his wife.  And just under 7 days he is going to embark  on a [place] cruise.  This visit is precautionary visit just before that.  Most recent visit was in April 2019.  At this point in time in terms of his IPF he feels stable.  He is compliant with full dose of 5 and is having only mild diarrhea.  Other than that he is tolerating it just fine.  He is looking forward to his cruise.  He had pulmonary function test today that shows an improvement compared to last visit and it is very  similar to summer/fall 2018 in terms of FVC and DLCO.  Although he does feel like he is coming down with an acute bronchitis and wants to take some antibiotic and prednisone I had of the trip.  We also discussed about him having prophylactic antibiotics handy.  He wants a refill on his Tussionex for cough.  At this point in time he is past 5 years of diagnosis and not eligible for research trials he is not interested in transplant although the recent pulmonary fibrosis foundation meeting he did hear from a lot of transplant patients about the individual personal experience.       OV 09/19/2018  Subjective:  Patient ID: Tanner Bishop, male , DOB: 1946-01-10 , age 44 y.o. , MRN: [id number] , ADDRESS: [place] [id number]   09/19/2018 -   Chief Complaint  Patient presents with   Follow-up    Pt saw cards 08/23/18. States he is still having problems with his heart going in and out of rhythm and states he has had some episodes to where he almost passes out. States his breathing is at a stable point. States he also has  an occ cough.     HPI Nicklaus Batalla 75 y.o. -presents 5-year follow-up.  I personally saw him in June 2019.  Then approximately a month ago he presented and saw acutely my nurse practitioner because of dizziness.  It was felt to be A. fib RVR.  He subsequently saw Dr. Crissie Sickles his electrophysiologist.  I reviewed the note.  Flecainide was started.  He says despite that l approximately 10 weeks ago he had another episode of dizziness while getting out of the shower.  He was tachycardic with a heart rate of 140s.  He felt he might pass out but there is no focal neurologic deficits or abnormal sensation.  His pulse ox was normal at 97% at that time.  It happened at rest.  He says he has been walking on the treadmill for several miles and he does not desaturate.  He says his pulse ox is 93% at that time.  He never drops into the 80s.  He does not think his dizziness and  palpitation issues are related to his pulmonary fibrosis.  However [name] is wondering about oxygen drops during this time.  He is not tolerating his nintedanib at full dose without any problems.  He has had a successful [place] trip.  He is participating in the patient support group.  He is up-to-date with his flu shot      OV 10/13/2018  Subjective:  Patient ID: Tanner Bishop, male , DOB: 23-Dec-1945 , age 61 y.o. , MRN: [id number] , ADDRESS: [place] [id number]   10/13/2018 -   Chief Complaint  Patient presents with   Follow-up    review PFT.  c/o baseline sob with exertion.       HPI Dimitrios Bellis 75 y.o. -presents for follow-up of IPF to the  ILD clinic.  This visit is arranged because letter physiologist [name] was concerned about hypoxemia effects on his atrial fibrillation.  At this point in time patient tells me that after increasing dose of flecainide his atrial fibrillation and palpitations have improved and resolved.  Overall he feels stable.  On September 26, 2018 he had 6-minute walk test and this was pretty robust with walking 384 meters and with lowest pulse ox of 95%.  Then on October 05, 2018 he had overnight pulse oximetry that shows 12 minutes of desaturation less than 88%.  He is not interested in oxygen.  Then he called his October 07, 2018 with worsening bronchitis episodes and we gave him 5 days of prednisone and antibiotics.  He finished his last dose yesterday.  Overall he feels stable but pulmonary function test today shows decline in both FVC and DLCO.  And then when I questioned him he felt like he still had some residual bronchiti and feels another course of prednisone could help him.  There are no other new issues.  Last liver function test October 2019 and was normal.      OV 01/12/2019  Subjective:  Patient ID: Tanner Bishop, male , DOB: 07/21/1946 , age 28 y.o. , MRN: [id number] , ADDRESS: [place]  [id number]   01/12/2019 -   Chief Complaint  Patient presents with   Follow-up    PFT performed today. Pt states he is about the same since last visit.States he still becomes SOB with exertion, has a lot of coughing but has been taking mucinex, and also has had some CP or chest tightness.     HPI Tanner Bishop 75 y.o. -returns for follow-up of his IPF.  He is now more than 5 years since diagnosis.,  April 2020 he will be 6 years since diagnosis.  He continues to exercise well on the treadmill and according to his wife he is does well.  He is not dropping oxygen at this time.  He does not have a cough when needed works on the tile.  However at other times the day especially when he is trying to lie down to bed or when he gets up in the morning and goes to the bathroom he has really bad cough.  Overall the cough severity is now 6 out of 10 in terms of how it is impacting his quality of life.  He tells me that every time he takes prednisone for the cough he feels better but then when he comes off prednisone cough gets worse.  He said multiple prednisone courses of brief duration for this chronic cough.  In addition for the last few weeks has had yellow sinus drainage and sinus headache and sinus congestion and he feels this is again making his chronic cough worse.  He is open to taking antibiotic and prednisone course.  In terms of taking nintedanib he has no side effects.  His last liver function test reviewed was in October 2019 and it was normal at that time.  Overall at this point in time he really wants good significant support for his cough in terms of affecting his quality of life.    ROS - per HPI     OV 02/16/2019  Subjective:  Patient ID: Tanner Bishop, male , DOB: [birth year] , age 28 y.o. , MRN: [id number] , ADDRESS: [address] [place] [address]   02/16/2019 -   Chief Complaint  Patient presents with   Follow-up  HRCT and sinus CT performed 2/18. Pt states he is about the same  as last visit and states SOB is about the same. Pt still has a dry cough. Denies any complaints of CP/chest tightness.     HPI Tanner Bishop 75 y.o. -returns for follow-up of his IPF.  He presents with his wife.  He is here to review the test results.  He had high-resolution CT chest and CT sinus.  These were done in order to reevaluate his disease because he is having significant cough.  His high-resolution CT chest shows only mild progression in his IPF in 3 years.  However there is significant new findings of tracheobronchomalacia although there is no lung cancer.  He also has two-vessel coronary artery calcification.  These are new findings.  Since last visit and a sinus episode he is stable.  He is currently run out of his nasal steroids.  He says the pharmacy said that I declined his prescription request.  I do not recollect such a conversation.  In any event he is now going to participate in the cough study called scenic.  He has received a copy of the consent form.  He is rated.  He has an appointment for his consent visit.  It is possible that the nasal steroid is on the exclusion list but I do not have the exclusion list with me.  Anyway he is not consented.  He is not having chest pain when he does his treadmill exercises.  He works out hard.  He states he gets tachycardic.  He says he has a 6 times that he will die from his heart condition before his pulmonary fibrosis.  He has upcoming appointment with his cardiologist [name] for atrial fibrillation.  ...................................................................... OV 06/08/2019   Subjective:  Patient ID: Tanner Bishop, male , DOB: Jun 28, 1946 , age 34 y.o. , MRN: [id number] , ADDRESS: [place] [id number]   06/08/2019 -   Chief Complaint  Patient presents with   Follow-up    1wk f/u for IPF. Patient stated that he was given a round of prednisone last week for some SOB but is feeling much better.      HPI Tanner  Bishop 75 y.o. -returns for IPF follow-up.  Last seen in March 2020.  Since the pandemic started he has been social distancing.  He has not attended any support group.  He has been tolerating his Ofev quite well without any difficulty.  Recently had a bronchitis exacerbation and we gave prednisone.  This resolved his cough.  He was going to participate in a cough study but because of the pandemic the study got canceled.  He tells me that every time he takes prednisone the cough goes away.  He is very interested in taking daily prednisone for symptom relief of cough.  We discussed the side effects of prednisone that include weight gain, easy bruising, adrenal insufficiency, osteoporosis, cataracts hypertension, diabetes, immunosuppression.  Despite all this he wants to take a low-dose prednisone daily.  His last liver function test was in March 2020.  This needs to be retested because he is on nintedanib.  His symptom scores at walk test show relative stability.  His last pulmonary function test was in January 2020 but because of the pandemic is on hold.  We are using symptom and walk test to determine progression.         OV 04/11/2020  Subjective:  Patient ID: Tanner Bishop, male , DOB: [birth year] ,  age 55 y.o. , MRN: [id number] , ADDRESS: [address] [place] [address]   04/11/2020 -   Chief Complaint  Patient presents with   Follow-up    PFT performed today. Pt states he feels like his breathing is gradually becoming worse and states it could be at any time that he is SOB.   #IPF - uip path 03/22/13  - On OFEV since early May 2015 -On chronic daily prednisone because of cough since 2020  HPI Tanner Bishop 75 y.o. -presents with his wife IPF follow-up.  Last saw him June 2020.  After that he feels his dyspnea is worse.  He says that he could do several miles in one 1 hour and 30 minutes on the treadmill.  The same distance and now taking 1 hour and 40 minutes.  He feels his disease is  getting worse.  He says the prednisone is working well for him because of improved cough and wellbeing.  He is having some skin bruising because of that.  The nintedanib he is tolerating well except for some mild diarrhea.  On objective symptom score is actually similar to 1 year ago but may be slightly worse than 6 months ago or 9 months ago.  On pulmonary function testing he is stable compared to 1 year ago but slow and steadily he is definitely worse over the many years.  We noticed on his walking desaturation test that he did not mount a tachycardic response as he is done in the past.  He is on flecainide for atrial fibrillation.  He takes his 3 times daily.  He feels his atrial fibrillation is under good control.  There is no chest pain.  There is no weight loss.  Is no loss of physical conditioning.  He is interested in clinical trials.   A month ago he had nausea and his amylase/lipase was elevated.  He says he is fine now.  Primary care address this. ROS - per HPI  IMPRESSION: 1. 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 Findings are consistent with UIP per consensus guidelines: Diagnosis of Idiopathic Pulmonary Fibrosis: An Official ATS/ERS/JRS/ALAT Clinical Practice Guideline. [place], Iss 5, [phone], Aug 14 2017. 2. 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 3. Two-vessel coronary atherosclerosis.   Aortic Atherosclerosis (ICD10-I70.0).     Electronically Signed   By: Ilona Sorrel M.D.   On: 01/31/2019 12:24  OV 06/11/2020  Subjective:  Patient ID: Tanner Bishop, male , DOB: [birth year] , age 62 y.o. , MRN: [id number] , ADDRESS: [place] [id number]   06/11/2020 -   Chief Complaint  Patient presents with   Follow-up    PFT performed today.  Pt states he has been doing good since last visit. States  breathing is about the same.,    #IPF - uip path 03/22/13  - On OFEV since early May 2015 -On chronic daily prednisone because of cough since 2020  HPI Tanner Bishop 75 y.o. -returns to clinic for ILD follow-up.  In the interim he feels that his wife has worsening dementia.  She is able to self-care but only does minimal cooking.  She is more irritable.  His daughter Theadora Rama is now on disability because of back issues.  Patient has a son in [place] who he is close with.  Together they take care of his wife.  He says  he has become the primary caregiver now for his wife.  Nevertheless she is functional and does some ADLs.  He continues with nintedanib 150 mg twice daily.  Overall tolerance is good just mild diarrhea.  This is baseline.  Shortness of breath is baseline symptom score is 14 and similar to the past.  Pulmonary function test appears stable.  Walking desaturation test is stable.  His last liver function test was 2 months ago.  His main issue is that he is having some financial issues covering the cost of nintedanib.  He $6000 grant is running out.  He only has 1 month supply left.  He wants to meet with the pharmacist.      OV 11/12/2020   Subjective:  Patient ID: Tanner Bishop, male , DOB: [birth year], age 4 y.o. years. , MRN: [id number],  ADDRESS: [place] [address] PCP  [name], MD Providers : Treatment Team:  Attending Provider: Brand Males, MD Patient Care Team: [name], MD as PCP - General Brand Males, MD as Consulting Physician (Pulmonary Disease) [place], Anne Ng, [facility] as Pharmacist (Pharmacist)    Chief Complaint  Patient presents with   Follow-up    IPF, still getting SOB on DOE with some chest tightness       HPI Clifton Macmurray 75 y.o. -IPF follow-up.  Presents with his wife who I am seeing after a long time.  According to the patient patient himself is stable from IPF standpoint.  However he says for the last  several months he has had an exacerbation in his chronic back pain.  He has significant pain.  He has had steroid injections.  The first 1 was successful but the second 1 and subsequent failed.  He is not able to do his walks as he used to in the past.  He has gained some weight.  He is frustrated by this.  He is asking if he can go see a Restaurant manager, fast food.  The other issue with him was elevated amylase.  We never found a cause for this.  So his primary care physician worked him up he had abdominal CT imaging on 08/16/2020 and this was normal.  Otherwise he is okay  He is asking for a refill on his prednisone that he takes for cough.  He also wants Tussionex as needed.  He takes that for cough.  He also says that he got summoned for jury duty.  He is wondering what to do.  I told him under no circumstances he should ever do jury duty.  I worry about the risk   ROS - per HPI  OV 01/24/2021  Subjective:  Patient ID: Tanner Bishop, male , DOB: [birth year] , age 8 y.o. , MRN: [id number] , ADDRESS: [address] [place] [address] PCP [name], MD Patient Care Team: [name], MD as PCP - General Brand Males, MD as Consulting Physician (Pulmonary Disease) [place], Anne Ng, [facility] as Pharmacist (Pharmacist)  This Provider for this visit: Treatment Team:  Attending Provider: Brand Males, MD    01/24/2021 -   Chief Complaint  Patient presents with   Acute Visit    Increased SOB since last Friday, heart still feels "funny"   Follow-up idiopathic pulmonary fibrosis On nintedanib and low-dose prednisone for cough Idiopathic elevation in amylase unrelated to nintedanib.  Normal abdominal imaging.  HPI Tanner Bishop 75 y.o. -returns for an acute visit.  On Friday, 01/22/2021 he says he got up and he felt  like baseline and then when he walked he suddenly noticed that he was suddenly way more short of breath than usual.  His heart started feeling funny.  He took an acute visit with  cardiology.  I reviewed the notes.  I do not see an EKG but he tells me an EKG was done and he was in sinus rhythm.  He had normal troponin, normal BNP and normal D-dimer.  Chest x-ray was baseline.  I personally visualized this.  Therefore he was sent to pulmonary.  We are seeing him as an acute visit today.  His symptom scores are definitely worse than baseline.  He is tolerating his nintedanib and his weight is stable.  His walking desaturation test shows a sudden decline.  For the first time his pulse ox is below 88%.  His pace of walking was much slower.  He tells me he still feels funny in his chest.  There are no Covid symptoms.  He is fully vaccinated.  There is no edema orthopnea proximal nocturnal dyspnea coughing or wheezing.  There is no sputum or fever production.  His wife is here with him..        CT Chest data  DG Chest 2 View  Result Date: 01/23/2021 CLINICAL DATA:  Shortness of breath cardiac palpitations EXAM: CHEST - 2 VIEW COMPARISON:  12/01/2016 FINDINGS: Cardiac shadow is at the upper limits of normal in size but stable. Lungs are well aerated bilaterally with mild fibrotic scarring stable in appearance from the prior exam. No new focal infiltrate or sizable effusion is seen. No acute bony abnormality is noted. IMPRESSION: Chronic scarring without acute abnormality. Electronically Signed   By: Inez Catalina M.D.   On: 01/23/2021 08:24   CT Chest High Resolution  Result Date: 01/24/2021 CLINICAL DATA:  IPF EXAM: CT CHEST WITHOUT CONTRAST TECHNIQUE: Multidetector CT imaging of the chest was performed following the standard protocol without intravenous contrast. High resolution imaging of the lungs, as well as inspiratory and expiratory imaging, was performed. COMPARISON:  01/31/2019, 03/11/2016, 07/24/2015, 02/14/2013 FINDINGS: Cardiovascular: Aortic atherosclerosis. Cardiomegaly. Scattered left coronary artery calcifications. Enlargement of the main pulmonary artery measuring  up to 3.4 cm in caliber. No pericardial effusion. Mediastinum/Nodes: No enlarged mediastinal, hilar, or axillary lymph nodes. Thyroid gland, trachea, and esophagus demonstrate no significant findings. Lungs/Pleura: Redemonstrated moderate pulmonary fibrosis in a pattern with apical to basal gradient, featuring traction bronchiectasis, irregular peripheral interstitial opacity, septal thickening, subpleural bronchiolectasis, and areas of honeycombing at the lung bases. Fibrotic findings are minimally worsened compared to prior examination dated 01/31/2019 and further slightly worsened over a longer period of follow-up dating back to 02/14/2013. No significant air trapping on expiratory phase imaging. Evidence of prior left lung wedge biopsy. No pleural effusion or pneumothorax. Upper Abdomen: No acute abnormality. Musculoskeletal: No chest wall mass or suspicious bone lesions identified. IMPRESSION: 1. Redemonstrated moderate pulmonary fibrosis in a pattern with apical to basal gradient, featuring traction bronchiectasis, irregular peripheral interstitial opacity, septal thickening, subpleural bronchiolectasis, and areas of honeycombing at the lung bases. Fibrotic findings are minimally worsened compared to prior examination dated 01/31/2019 and further slightly worsened over a longer period of follow-up dating back to 02/14/2013. Findings are in a UIP pattern and in keeping with reported diagnosis of IPF. 2. Cardiomegaly and coronary artery disease. 3. Enlargement of the main pulmonary artery, as can be seen in pulmonary hypertension. Aortic Atherosclerosis (ICD10-I70.0). Electronically Signed   By: Eddie Candle M.D.   On: 01/24/2021 09:16  OV 03/11/2021  Subjective:  Patient ID: Tanner Bishop, male , DOB: [birth year] , age 43 y.o. , MRN: [id number] , ADDRESS: [address] [place] [address] PCP [name], MD Patient Care Team: [name], MD as PCP - General Brand Males, MD  as Consulting Physician (Pulmonary Disease) Viona Gilmore, [facility] as Pharmacist (Pharmacist)  This Provider for this visit: Treatment Team:  Attending Provider: Brand Males, MD    03/11/2021 -   Chief Complaint  Patient presents with   Follow-up    Follow up after PFT.  Pt stated that he has the oxygen but these are the larger tanks.  He is waiting to get the POC from [facility].    Follow-up idiopathic pulmonary fibrosis  - On nintedanib and low-dose prednisone for cough  -Start portable oxygen for progressive disease March 2022  Idiopathic elevation in amylase unrelated to nintedanib.  Normal abdominal imaging.   Normal right heart catheterization 02/21/2021 by Dr. Glori Bickers.    - Mean pressure 17.  Wedge pressure 6.  Cardiac index 2.6.  PVR 2.1  HPI Tanner Bishop 75 y.o. -presents for follow-up since his last visit when we noticed exertional hypoxemia.  Got echocardiogram and then sent him to cardiology.  He had right heart catheterization.  The result is reviewed and is normal.  He is tolerating his nintedanib fine.  He has had problems qualifying for oxygen even though he desaturated.  He says something about insurance did not approve my order.  He is also frustrated with [facility].  He wants to switch to [facility] [place].  He is interested in clinical trial but current phase 3 trials are on hold because of logistic issues with sponsor STAR[employer] study. Other studies might be available      OV 06/13/2021  Subjective:  Patient ID: Tanner Bishop, male , DOB: 1946/03/17 , age 4 y.o. , MRN: [id number] , ADDRESS: [address] [place] [address] PCP [name], MD Patient Care Team: [name], MD as PCP - General Lovena Le Champ Mungo, MD as PCP - Cardiology (Cardiology) Brand Males, MD as Consulting Physician (Pulmonary Disease) Viona Gilmore, [facility] as Pharmacist (Pharmacist)  This Provider for this visit: Treatment Team:  Attending Provider: Brand Males, MD    06/13/2021 -   Chief Complaint  Patient presents with   Follow-up    PFT  performed 6/30 and cxr performed today. Pt states he feels like he is worse since last visit and states he is also coughing more too. Pt did have Covid and since then he has been worse.      HPI Tanner Bishop 75 y.o. -presents for follow-up.  This is unscheduled visit.  He tells me that he is scheduled himself for low back surgery because of worsening sciatica with Dr. Kathyrn Sheriff.  This visit was to be done in the outpatient suite.  When he showed up preoperatively the anesthesiologist rejected surgical fitness because of crackles and because anesthesiologist felt there was pedal edema.  Patient himself denies any pedal edema.  [In fact on exam he does not have pedal edema].  He says he was frustrated with experience because cardiology had cleared him for the procedure.  He also reports that crackles were felt to be due to heart failure although he has pulmonary fibrosis.  In talking to him he did have COVID at the end of May 2022.  After that he is slightly worse.  His FVC shows slight decline but  his DLCO is stable.  Try to do a walk test on him but because of his sciatica he could not walk.  He says he uses oxygen with exercise and mowing yard.  He is really bothered by his pain.  He was wondering about nonsurgical maneuvers to help his pain.  We talked about Tylenol but within limits.  We talked about chiropractor but he says it did not help him.  We talked about dry needling.  He took a recommendation from me or someone that I personally use for dry needling.  However he thinks he will just proceed with surgery.  I discussed Dr. Kathyrn Sheriff and Dr. Kathyrn Sheriff we will plan the surgery in the hospital.  He wants refill of his cough medication.  His subjective symptom score is stable.  His chest x-ray looks stable.  He will have lab blood work today.        OV 08/14/2021  Subjective:  Patient ID: Tanner Bishop,  male , DOB: 12-Jul-1946 , age 73 y.o. , MRN: [id number] , ADDRESS: [place] [address] PCP [name], MD Patient Care Team: [name], MD as PCP - General Lovena Le Champ Mungo, MD as PCP - Cardiology (Cardiology) Brand Males, MD as Consulting Physician (Pulmonary Disease) Viona Gilmore, [facility] as Pharmacist (Pharmacist)  This Provider for this visit: Treatment Team:  Attending Provider: Brand Males, MD  Follow-up idiopathic pulmonary fibrosis  - On nintedanib and low-dose prednisone for cough  -Start portable oxygen for progressive disease March 2022  Idiopathic elevation in amylase unrelated to nintedanib.  Normal abdominal imaging.   Normal right heart catheterization 02/21/2021 by Dr. Glori Bickers.    - Mean pressure 17.  Wedge pressure 6.  Cardiac index 2.6.  PVR 2.1  08/14/2021 -   Chief Complaint  Patient presents with   Follow-up    Pt states that his breathing has become worse since last visit.      HPI Tanner Bishop 75 y.o. -returns for follow-up.  Approximately 2 weeks ago on 07/30/2021 he had a spinal surgery.  Surgery went well.  After that the back pain is improved and he sustained improvement.  His effort tolerance then improved but in the last 1 week he is progressively more short of breath.  He says he uses 2 L of nasal cannula oxygen at rest and 3 L with exertion but he states he is unable to do the things he used to even a week ago.  There is no associated worsening edema.  No orthopnea no paroxysmal nocturnal dyspnea.  No chest pain.  No hemoptysis no fever no chills no worsening cough.  He is taking his prednisone and nintedanib correctly.  He says the shortness of breath is so worse that he worries that he is going to die pretty soon.  His weight is stable.  He is unclear if he is desaturating but he did notice that he is having fluctuating heart rate.  Intermittently have significant bradycardia to 40.  His ILD symptom score  shows significant worsening.    CT     OV 09/11/2021  Subjective:  Patient ID: Tanner Bishop, male , DOB: [birth year] , age 67 y.o. , MRN: [id number] , ADDRESS: [address] [place] [address] PCP [name], MD Patient Care Team: [name], MD as PCP - General Lovena Le Champ Mungo, MD as PCP - Cardiology (Cardiology) Brand Males, MD as Consulting Physician (Pulmonary Disease) Viona Gilmore, [facility] as Pharmacist (  Pharmacist)  This Provider for this visit: Treatment Team:  Attending Provider: Brand Males, MD    09/11/2021 -   Chief Complaint  Patient presents with   Follow-up    Pt states his breathing is about the same since last visit.   Follow-up idiopathic pulmonary fibrosis  - On nintedanib and low-dose prednisone for cough  -Start portable oxygen for progressive disease March 2022 - >  hypoxemic at rest sept 2022  Idiopathic elevation in amylase unrelated to nintedanib.  Normal abdominal imaging.   Normal right heart catheterization 02/21/2021 by Dr. Glori Bickers.    - Mean pressure 17.  Wedge pressure 6.  Cardiac index 2.6.  PVR 2.1  Covid may 2022 paxlovid  Spin Surgery - aug 2022  HPI Tanner Bishop 75 y.o. -acute visit.  After his last visit he did see cardiology.  Its he appears that his atrial fibrillation is okay.  He then called the support leader Marlane Mingle for the pulmonary fibrosis support group and was complaining about his worsening shortness of breath.  Mr. Hildred Alamin called me and I was asked to see the patient in short order.  But the patient tells me that compared to the last month he is the same.  Nevertheless significant worsening in dyspnea.  In July 2022 before the spine surgery he could walk a mile or more.  Now even with 2 L nasal cannula walking to the mailbox and back is making him very dyspneic.  He is not able to do a lot of activities of daily living easily.  It used to taking 5 minutes to put his clothes on and finish  her shower.  Now is taking 20 minutes.  He seems of lost some weight.  He continues on his nintedanib.  His echocardiogram was unrevealing.  He has had prednisone but this is not helping.        SYMPTOM SCALE - ILD 02/16/2019  06/08/2019  04/11/2020  06/11/2020  11/12/2020 169# weight 01/24/2021 171# 03/11/2021 175# 06/13/2021 174# - covid end emay 2022 08/14/2021 172# 09/11/2021 167#  O2 use _0  ra ra Uses o2 with exercise. Mowing yard 2L rest, 3L exertion 2 L rest, 3l exertion  2Shortness of Breath 0 -> 5 scale with 5 being worst (score 6 If unable to do)           2At rest _1  Simple tasks - showers, clothes change, eating, shaving _2 Household (dishes, doing bed, laundry) _3 Shopping _4 Walking level at own pace _5 Walking up Stairs _6 Total (40 - 48) Dyspnea Score _7 couyg 3 Xx - due to recent pred _8 Did not answer 1  How bad is your fatigue 3 x _9 0 5 4  nausea   0 0 0 0 0 0 0 0  vomit   0 0 0 0 0 0 0 0  diarrhea   _10 0  anxiety   0 0 0 0 0 0 1 00  deopresso  0 0 0 0 0 0 0 0       Simple office walk 185 feet x  3 laps goal with forehead probe 09/19/2018  01/12/2019  02/16/2019  06/08/2019  04/11/2020  06/11/2020  11/12/2020  01/24/2021  03/11/2021  08/14/2021  09/11/2021   O2 used Room air Room air Room air Room air  ra ra ra ra 2L [place] Walk RA 86%  Number laps completed _0 of 2 laps only .f   Comments about pace normal normal normal normal  Mod pace avg [ace Slow pace     Resting Pulse Ox/HR 100% and 73/min 100% ad 74/min 100% and 68/min 98% and 82/min 99% and 60/min 98% and 54 98% and 73/min 94% and 88/min 97% RA at rest 97% and HR 85 97% 2L [place] at rest, HR 73  Final Pulse Ox/HR 93% and 85/min 92% and 84/min 93% and 81/min 91% and 96/mni 91% and 74/mm 90% and 71/min 90% and 86/min 87% and  95/min desats to 87% on 2nd lap 93% ad HR 92 aat 1 lap and felt weak 4L [place] did all 3 laps - maintainted > 88%  Desaturated </= 88% no no no no yes yes yes yes     Desaturated <= 3% points Yes, 7 poin Yes, 8 ponts Yes, 7 points Yes, 7 points Yes, 8point Yes, 8 point Yes, 8 points Yes, 7 points     Got Tachycardic >/= 90/min no no no yes         Symptoms at end of test x none none none dyspnea none None dyspnea dyspnea  weak   Miscellaneous comments x x stable stable but tachy for first time     Corrected with 3L [place] - just 1 lap. Not all 3 laps tested on 3L [place] Premature stopping wihtout desats    Narrative & Impression  CLINICAL DATA:  76 year old male with history of interstitial lung disease. Follow-up examination.   EXAM: CT CHEST WITHOUT CONTRAST   TECHNIQUE: Multidetector CT imaging of the chest was performed following the standard protocol without intravenous contrast. High resolution imaging of the lungs, as well as inspiratory and expiratory imaging, was performed.   COMPARISON:  Chest CT 01/24/2021.   FINDINGS: Cardiovascular: Heart size is mildly enlarged. There is no significant pericardial fluid, thickening or pericardial calcification. There is aortic atherosclerosis, as well as atherosclerosis of the great vessels of the mediastinum and the coronary arteries, including calcified atherosclerotic plaque in the left main, left anterior descending and left circumflex coronary arteries.   Mediastinum/Nodes: No pathologically enlarged mediastinal or hilar lymph nodes. Please note that accurate exclusion of hilar adenopathy is limited on noncontrast CT scans. Esophagus is unremarkable in appearance. No axillary lymphadenopathy.   Lungs/Pleura: High-resolution images again demonstrate some areas of ground-glass attenuation, septal thickening, thickening of the peribronchovascular interstitium, cylindrical bronchiectasis, peripheral bronchiolectasis and some mild  honeycombing. Findings have a definitive craniocaudal gradient and appear mildly progressive compared to the prior examination. Inspiratory and expiratory imaging is unremarkable. No acute consolidative airspace disease. No pleural effusions. No definite suspicious appearing pulmonary nodules or masses are confidently identified upon this background of interstitial lung disease. Some tiny calcified granulomas are noted in the left lower lobe.   Upper Abdomen: Aortic atherosclerosis.   Musculoskeletal: There are no aggressive appearing lytic or blastic lesions noted in the visualized portions of the skeleton.   IMPRESSION: 1. The appearance of the lungs is compatible with  interstitial lung disease, with a spectrum of findings considered diagnostic of usual interstitial pneumonia (UIP) per current ATS guidelines, as detailed above. Minimal progression of disease compared to the prior study. 2. Aortic atherosclerosis, in addition to left main and 2 vessel coronary artery disease. Please note that although the presence of coronary artery calcium documents the presence of coronary artery disease, the severity of this disease and any potential stenosis cannot be assessed on this non-gated CT examination. Assessment for potential risk factor modification, dietary therapy or pharmacologic therapy may be warranted, if clinically indicated. 3. Mild cardiomegaly.     Electronically Signed   By: Vinnie Langton M.D.   On: 08/20/2021 13:48     PFT  PFT Results Latest Ref Rng & Units 06/12/2021 03/11/2021 06/11/2020 04/11/2020 01/12/2019 10/13/2018 05/16/2018  FVC-Pre L 1.97 2.10 2.26 2.23 2.21 2.25 2.38  FVC-Predicted Pre % 50 53 57 56 56 57 60  FVC-Post L - - - - 2.25 - -  FVC-Predicted Post % - - - - 57 - -  Pre FEV1/FVC % % 89 86 88 89 87 86 88  Post FEV1/FCV % % - - - - 90 - -  FEV1-Pre L 1.74 1.81 2.00 1.99 1.93 1.93 2.09  FEV1-Predicted Pre % 62 64 70 69 67 67 73  FEV1-Post L - - - -  2.03 - -  DLCO uncorrected ml/min/mmHg 13.83 11.41 13.69 14.72 14.38 14.17 16.48  DLCO UNC% % 59 48 58 62 50 50 58  DLCO corrected ml/min/mmHg 13.83 11.51 13.69 14.76 - - -  DLCO COR %Predicted % 59 48 58 62 - - -  DLVA Predicted % 114 89 96 110 102 94 110  TLC L - - - - - - -  TLC % Predicted % - - - - - - -  RV % Predicted % - - - - - - -    IMPRESSIONS     1. Left ventricular ejection fraction by 3D volume is 59 %. The left  ventricle has normal function. The left ventricle has no regional wall  motion abnormalities. There is mild left ventricular hypertrophy. Left  ventricular diastolic parameters are  consistent with Grade I diastolic dysfunction (impaired relaxation). The  average left ventricular global longitudinal strain is 14.0 %.   2. Right ventricular systolic function is mildly reduced. The right  ventricular size is mildly enlarged. There is mildly elevated pulmonary  artery systolic pressure.   3. Left atrial size was moderately dilated.   4. The mitral valve is degenerative. Mild to moderate mitral valve  regurgitation. No evidence of mitral stenosis.   5. The aortic valve is normal in structure. Aortic valve regurgitation is  not visualized. No aortic stenosis is present.   6. The inferior vena cava is normal in size with greater than 50%  respiratory variability, suggesting right atrial pressure of 3 mmHg.   Comparison(s): No significant change from prior study. Prior images  reviewed side by side.     has a past medical history of Allergy, Arthritis, Atrial flutter ([place]), Cataract, Dysrhythmia, GERD (gastroesophageal reflux disease), H/O hiatal hernia, History of kidney stones, Hyperlipidemia, Injury of right hand, Pulmonary fibrosis ([place]), Renal stone (06/2014), and Ulcer.   reports that he quit smoking about 42 years ago. His smoking use included cigarettes. He has a 16.00 pack-year smoking history. He has never used smokeless tobacco.  Past Surgical  History:  Procedure Laterality Date   ATRIAL FLUTTER ABLATION N/A 04/08/2015  Procedure: ATRIAL FLUTTER ABLATION;  Surgeon: Evans Lance, MD;  [facility] CATH LAB;  Service: Cardiovascular;  Laterality: N/A;   CATARACT EXTRACTION, BILATERAL  2016   COLONOSCOPY  03/01/2017   per Dr. Loletha Carrow, clear, repeat in 5 yrs (brother had colon cancer)    ESOPHAGOGASTRODUODENOSCOPY  2007   EXTRACORPOREAL SHOCK WAVE LITHOTRIPSY  06/17/2014   per Dr. Jeffie Pollock    HAND SURGERY Right    LUMBAR LAMINECTOMY/DECOMPRESSION MICRODISCECTOMY Left 06/26/2021   Procedure: MICRODISCECTOMY LUMBAR FOUR-LUMBAR FIVE;  Surgeon: Consuella Lose, MD;  Location: [facility];  Service: Neurosurgery;  Laterality: Left;   LUNG BIOPSY Left 03/22/2013   Procedure: LUNG BIOPSY;  Surgeon: Melrose Nakayama, MD;  Location: [facility];  Service: Thoracic;  Laterality: Left;   POLYPECTOMY     RIGHT HEART CATH N/A 02/21/2021   Procedure: RIGHT HEART CATH;  Surgeon: Jolaine Artist, MD;  Location: [facility] CV LAB;  Service: Cardiovascular;  Laterality: N/A;   TONSILLECTOMY     UPPER GASTROINTESTINAL ENDOSCOPY     VIDEO ASSISTED THORACOSCOPY Left 03/22/2013   Procedure: VIDEO ASSISTED THORACOSCOPY;  Surgeon: Melrose Nakayama, MD;  Location: [facility];  Service: Thoracic;  Laterality: Left;    No Known Allergies  Immunization History  Administered Date(s) Administered   Fluad Quad(high Dose 65+) 08/25/2019   Influenza Split 09/13/2013   Influenza, High Dose Seasonal PF 10/06/2016, 09/16/2017, 08/17/2018, 09/01/2021   Influenza-Unspecified 09/07/2014, 09/14/2015, 09/13/2020   PFIZER(Purple Top)SARS-COV-2 Vaccination 02/12/2020, 03/12/2020, 08/14/2020   Pneumococcal Conjugate-13 02/16/2017   Pneumococcal Polysaccharide-23 05/26/2013   Td 12/15/2007   Tdap 02/23/2018   Zoster Recombinat (Shingrix) 10/13/2018, 07/25/2019   Zoster, Live 02/19/2014    Family History  Problem Relation Age of Onset   Liver cancer Mother     Diabetes Sister    Lung cancer Sister    Heart disease Brother    Diabetes Brother    Colon cancer Brother    Diabetes Other    Cancer Other        prostate   Colon polyps Neg Hx    Esophageal cancer Neg Hx    Rectal cancer Neg Hx    Stomach cancer Neg Hx      Current Outpatient Medications:    albuterol (VENTOLIN HFA) 108 (90 Base) MCG/ACT inhaler, Inhale 1-2 puffs into the lungs every 6 (six) hours as needed for wheezing or shortness of breath., Disp: 18 g, Rfl: 1   chlorpheniramine-HYDROcodone (TUSSIONEX PENNKINETIC ER) 10-8 MG/5ML SUER, Take 5 mLs by mouth 2 (two) times daily as needed for cough (PRN, cough, ipf, chronic cough, palliation)., Disp: 140 mL, Rfl: 0   flecainide (TAMBOCOR) 50 MG tablet, Take 1 tablet (50 mg total) by mouth 3 (three) times daily., Disp: 270 tablet, Rfl: 6   fluticasone (FLONASE) 50 MCG/ACT nasal spray, Place 2 sprays into both nostrils daily as needed for allergies., Disp: , Rfl:    Nintedanib (OFEV) 150 MG CAPS, Take 1 capsule (150 mg total) by mouth 2 (two) times daily., Disp: , Rfl:    pantoprazole (PROTONIX) 40 MG tablet, TAKE 1 TABLET BY MOUTH TWICE A DAY, Disp: 180 tablet, Rfl: 0   predniSONE (DELTASONE) 10 MG tablet, 4 x 3 days, 3 x 3 days, 2 x 3 days, 1 x 3 days then resume 5 mg maintenance dose, Disp: 30 tablet, Rfl: 0   predniSONE (DELTASONE) 5 MG tablet, Take 1 tablet (5 mg total) by mouth daily with breakfast., Disp: , Rfl:    zolpidem (  AMBIEN) 5 MG tablet, Take 1 tablet (5 mg total) by mouth at bedtime as needed for sleep., Disp: , Rfl:   Current Facility-Administered Medications:    chlorpheniramine-HYDROcodone (TUSSIONEX) 10-8 MG/5ML suspension 5 mL, 5 mL, Oral, Q12H PRN, Brand Males, MD      Objective:   Vitals:   09/11/21 1331 09/11/21 1337  BP: 120/70   Pulse: 73 72  SpO2: (!) 86% 97%  Weight: 168 lb 6.4 oz (76.4 kg)   Height: _0  (1.727 m)     Estimated body mass index is 25.61 kg/m as calculated from the  following:   Height as of this encounter: _1  (1.727 m).   Weight as of this encounter: 168 lb 6.4 oz (76.4 kg).  _2 @  Filed Weights   09/11/21 1331  Weight: 168 lb 6.4 oz (76.4 kg)     Physical Exam  General: No distress. Looks well Neuro: Alert and Oriented x 3. GCS 15. Speech normal Psych: Pleasant Resp:  Barrel Chest - no.  Wheeze - no, Crackles - yes at base, No overt respiratory distress CVS: Normal heart sounds. Murmurs - no Ext: Stigmata of Connective Tissue Disease - no HEENT: Normal upper airway. PEERL +. No post nasal drip        Assessment:       ICD-10-CM   1. IPF (idiopathic pulmonary fibrosis) ([place])  J84.112     2. Chronic respiratory failure with hypoxia (H[facility])  J96.11          Plan:     Patient Instructions  IPF (idiopathic pulmonary fibrosis) (HCC)   -You are having dramatic decline in your shortness of breath and symptoms following spinal surgery on 07/30/2021 and through 09/11/2021   -I think this is because of worsening pulmonary fibrosis   - now needing 2L O2 even a trest  Plan -Walk test on 4 L of oxygen today - keep pulse ox > 88% at home with exertion - re-refer Dr Haroldine Laws for Right heart cath - if pressurs up we can consider tyvaso nebulizer  - contiue ofev   Follow-up - keep 30 min visit with Dr  Chase Caller face-to-face visit in  Oct 2022    SIGNATURE    Dr. Brand Males, M.D., F.C.C.P,  Pulmonary and Critical Care Medicine Staff Physician, [place] Director - Interstitial Lung Disease  Program  [facility] [place] [facility] [place], [place], [address]  Pager: [phone], If no answer or between  15:00h - 7:00h: call [phone] Telephone: [phone]  1:55 PM 09/11/2021

## 2021-09-11 NOTE — Patient Instructions (Addendum)
IPF (idiopathic pulmonary fibrosis) (HCC)   -You are having dramatic decline in your shortness of breath and symptoms following spinal surgery on 07/30/2021 and through 09/11/2021   -I think this is because of worsening pulmonary fibrosis   - now needing 2L O2 even a trest  Plan -Walk test on 4 L of oxygen today - keep pulse ox > 88% at home with exertion - re-refer Dr Haroldine Laws for Right heart cath - if pressurs up we can consider tyvaso nebulizer  - contiue ofev   Follow-up - keep 30 min visit with Dr  Chase Caller face-to-face visit in  Oct 2022

## 2021-09-11 NOTE — Telephone Encounter (Signed)
Tanner Bishop/Tanner Bishop  Tanner Bishop had unrevealing right heart catheterization in March 2022 done by you.  Since then he had spine surgery and after that he has declined..  Currently needing oxygen even at rest.  Plan - Would you be able to consider repeat right heart catheterization [recent echo nonrevealing] ?  If he has WHO group 3 pulmonary hypertension then he would qualify for treprostinil.  -Please note is Tanner Bishop patient for atrial fibrillation

## 2021-09-16 ENCOUNTER — Other Ambulatory Visit: Payer: Self-pay | Admitting: Internal Medicine

## 2021-09-19 ENCOUNTER — Other Ambulatory Visit: Payer: Self-pay | Admitting: Internal Medicine

## 2021-09-19 NOTE — Telephone Encounter (Signed)
Ok to fill.. But please send it for my fingerprint signature . I will be off epic after 1pm

## 2021-09-19 NOTE — Telephone Encounter (Signed)
Medication has been pended for you. Thanks :)

## 2021-09-19 NOTE — Telephone Encounter (Signed)
MR pt is requesting a refill of the tussionex.  Please advise. Thanks  Last refilled on 06/18/2021 for 140 ML for the tussionex with no refills.  Pt was last seen on 09/11/2021 by MR.  thanks'

## 2021-09-23 ENCOUNTER — Other Ambulatory Visit: Payer: Self-pay | Admitting: *Deleted

## 2021-09-23 DIAGNOSIS — J84112 Idiopathic pulmonary fibrosis: Secondary | ICD-10-CM

## 2021-09-24 ENCOUNTER — Ambulatory Visit (INDEPENDENT_AMBULATORY_CARE_PROVIDER_SITE_OTHER): Payer: PPO | Admitting: Internal Medicine

## 2021-09-24 ENCOUNTER — Encounter: Payer: Self-pay | Admitting: Internal Medicine

## 2021-09-24 ENCOUNTER — Other Ambulatory Visit: Payer: Self-pay

## 2021-09-24 ENCOUNTER — Ambulatory Visit: Payer: PPO | Admitting: Internal Medicine

## 2021-09-24 VITALS — BP 116/64 | HR 61 | Temp 97.1°F | Ht 67.5 in | Wt 169.0 lb

## 2021-09-24 DIAGNOSIS — J9611 Chronic respiratory failure with hypoxia: Secondary | ICD-10-CM | POA: Diagnosis not present

## 2021-09-24 DIAGNOSIS — R053 Chronic cough: Secondary | ICD-10-CM

## 2021-09-24 DIAGNOSIS — J209 Acute bronchitis, unspecified: Secondary | ICD-10-CM

## 2021-09-24 DIAGNOSIS — J84112 Idiopathic pulmonary fibrosis: Secondary | ICD-10-CM

## 2021-09-24 LAB — PULMONARY FUNCTION TEST
DL/VA % pred: 96 %
DL/VA: 3.9 ml/min/mmHg/L
DLCO cor % pred: 47 %
DLCO cor: 11.02 ml/min/mmHg
DLCO unc % pred: 46 %
DLCO unc: 10.95 ml/min/mmHg
FEF 25-75 Pre: 2.9 L/sec
FEF2575-%Pred-Pre: 141 %
FEV1-%Pred-Pre: 60 %
FEV1-Pre: 1.68 L
FEV1FVC-%Pred-Pre: 124 %
FEV6-%Pred-Pre: 51 %
FEV6-Pre: 1.86 L
FEV6FVC-%Pred-Pre: 106 %
FVC-%Pred-Pre: 48 %
FVC-Pre: 1.86 L
Pre FEV1/FVC ratio: 90 %
Pre FEV6/FVC Ratio: 100 %

## 2021-09-24 MED ORDER — HYDROCOD POLST-CPM POLST ER 10-8 MG/5ML PO SUER: 5.0000 mL | Freq: Two times a day (BID) | ORAL | 0 refills | Status: DC | PRN

## 2021-09-24 NOTE — Patient Instructions (Addendum)
ICD-10-CM   1. IPF (idiopathic pulmonary fibrosis) ([place])  J84.112     2. Chronic respiratory failure with hypoxia (HCC)  J96.11     3. Chronic cough  R05.3     4. Acute bronchitis, unspecified organism  J20.9         -You are having dramatic decline in your shortness of breath and symptoms following spinal surgery on 07/30/2021 and through 09/11/2021. Lung function test shows 10-15% decline in lung functio between June 2022 and 09/24/2021. There definitely is worsening  IPF therefore   - now needing 2L O2 even a trest  - currently also mild acute bronchitis (possible)  Plan - 2L o2 rest, 4L [place] o2 with exertion - keep pulse ox > 88% at home with exertion - re-refer Dr Haroldine Laws for Right heart cath - if pressurs up we can consider tyvaso nebulizer - I have messaged them  - continue Ofev  - if pulm artery pressures normal, then can consider TETON (inhaled tyvaso - above) study for IPF or inhaled Nitric Oxide study  - refill opioid cough syrup  - for bronchitis - Take prednisone 40 mg daily x 2 days, then 66m daily x 2 days, then 167mdaily x 2 days, then 22m59maily baseline - Take doxycycline 100m11m twice daily x 5 days; take after meals and avoid sunlight      Follow-up - 6 -8 week followup Dr RamaChase Caller30 min visit - Symptoms socre and walk test on 2L [place] at followu

## 2021-09-24 NOTE — Patient Instructions (Signed)
Spirometry/DLCO performed today.

## 2021-09-24 NOTE — Progress Notes (Signed)
Spirometry/DLCO performed today.

## 2021-09-24 NOTE — H&P (View-Only) (Signed)
#IPF - uip path 03/22/13  - On OFEV since early May 2015   PFT FVC fev1 ratio BD fev1 TLC DLCO Walk test 19f x 3 laps wt rx             Spring 2014 2.9:L L/% /%        June 2015 2.7L/67% 2.34L/78% 86  3.68/57% 20.24/71%   ofev start  [name]  2016 2.5L/55% 2.1L/60% 84/108%    No desat, Pk HR 130    02/11/2015 Screening visit afferent cough study 2.59L/56% 2.24L/63% 87/112%    Dx with afluttter on ekg   Screen failed due to hx of stones  03/26/2015        No desat, PK HR 130    08/26/15 pft machine 2.73L/68% 2.38L/80% 87   [name]     06/29/2016 Office spiro 2.49L/56% 2.14L/65%     Pk HR 90 and lowest pulse ox 99% ->93%  ofev  11/02/2016  office full pft  2.61L/66% 2.3L/79%    17.23/60%   pfev  09/16/2017  2.53L/64%      Pulse ox 99% -> 93%, HR 81- > 84  ofev  12/22/2017        100% -> 97%,  HR 66 -> 96    09/19/2018        100% -> 93, HR 74 -> 85         OV 03/26/2015  Chief Complaint  Patient presents with   Follow-up    [name], dry cough. CATH procedure on 04/08/15. Denies any chest tightnes/congestion.    75year old male with idiopathic pulmonary fibrosis. He is on Ofev after having failed Esbriet in the past due to side effects. He is tolerating Ofev well. Liver function test in March 2016 was normal. I last saw him in January 2016. Subsequently in February 2016 we screened him for a cough idiopathic pulmonary fibrosis study called afferent but he screen failed due to history of renal stones. At this time he was incidentally diagnosed with new onset atrial flutter. He has seen Dr. GCrissie Sicklesand has been started on anticoagulation with Xarelto and metoprolol. He is due to undergo a ablation. He is not aware of the increased bleeding risk with Ofev in the setting of anticoagulation. He assures me that the anti-coag and is only until he undergoes ablation and after that the plan is to stop it. Therefore only short-term anticoagulation with Xarelto.  Overall his effort tolerance is fine. He has postponed his [facility] lung transplant until he completes his ablation.  Walking desaturation test in the office today he did not desaturate. This is stable. Spirometry not done   Past medical history: Atrial flutter new diagnoses as above   OV 06/29/2016  Chief Complaint  Patient presents with   Follow-up    pt states he is at baseline: sob with exertion, nonprod cough.      75year old male with idiopathic pulmonary fibrosis. He is on Ofev . He completed 6 months of PRAISE study (([id number] placebo) in l;ate winter/early spring 2017. This is SPalestinevisit. Overall stable. No worsening dyspnea and cough. Continues daily exercise is walking many miles. Some days he is sitting more than the others. He is interested in more trials and results of the praise study. These results are not out yet. He is compliant with his Ofe last liver function test was in February 2017. There are no new issues. He believes his atrial fibrillation  is in sinus rhythm;    OV  11/02/2016  Chief Complaint  Patient presents with   Follow-up    4 month IPF follow up and B&A review - does report increased prod cough with yellow mucus, wheezing, tightness in chest, increased SOB, x3-4 weeks with the weather change.  denies any f/c/s, hemoptysis, chest pain   IPF - on ofev. Last liver function test was in July 2017 and normal. Overall dyspnea stable. However he is been having diarrhea with ofev. He called in and we reduced it to 1 tablet a day and this improved the diarrhea. He back on 1 tablet twice a day. The diarrhea has returned although it is much milder. He has never taken any medication for this. Are function test shows slight improvement from July 2017 and similar levels to approximately one year ago  The new issue that he's having significant recurrent sinus infection in the back of chronic postnasal drainage. This since the fall 2017. He says that he and his wife catch  respirator infection and passive back and forth to each other. He is having cough, chest tightness, postnasal drip and yellow sputum. It is not affecting any dyspnea. There is no wheeze. There is no fever or hemoptysis.  OV 03/02/2017  Chief Complaint  Patient presents with   Follow-up    Pt states his breathing is at baseline. Pt c/o dry cough - pt states this is baseline. Pt denies CP/tightness.     Idiopathic pulmonary fibrosis on Ofev. Last seen November 2017. He is a routine follow-up.  He is here with his wife. His sinus infections a result. He is interested in research protocols but we have none right now. He is tolerating his Ofev associated with some mild diarrhea occasional which she takes medication. He is not much of a problem. He tells me that she walks 6 times a week for 5 miles a day. On the treadmill at a 5 incline walking at 3-1/2 miles an hour. For this he feels a little more dyspneic than before. Walking desaturation test 185 feet 3 laps on room air: His pulse ox dropped from 98% at rest and 93% with exertion. His heart rate jumped from 66 at rest and 97 at peak exertion. Features are suggestive of mild progression of the disease. He is not attending pulmonary fibrosis foundation because he felt this was negative but that was years ago. We discussed this and he might be interested again.     OV 09/16/2017  Chief Complaint  Patient presents with   Follow-up    PFT done today. Pt states that his breathing is gradually getting worse. SOB which depends if it is on exertion vs all the time and c/o prod cough with yellow mucus. Denies any CP   S IPF on fev followup. Overall doing well. He recent bronchitis and liked anabiotic and prednisone. He felt the prednisone helped his cough just currently rated as mild to moderate in severity. Helped the shortness of breath. He also helped arthralgia. He is now wondering what taking chronic prednisone. We had an extensive discussion about  this. Spirometry shows that the FVC stable compared to earlier this year but slightly progressive compared to one year ago. [facility] interstitial lung disease questionnaire shows symptoms. His wife is here with him. He is interested in research trials. He has participated in distress before. He is asking about opioid refill for cough if I wont do prednisone.   OV 12/22/2017  Chief Complaint  Patient presents with   Follow-up    Pt states he believes his breathing is at a stable point right now. States that he is coughing which is worse when he first wakes up and then also at night; occ will cough up yellow phlegm.     C.o ofev intolerance with fatigue, weight loss, diarrhea. Does not like lomotil. Wnaant to give it a holiday foir 2-3 weeks. INterested in research protocol - discussed [place]. 5 year cut off since diagnosis coming up 03/22/18. He is overall frustrated ith qualtiy of life. Walks 5 miles; starts feeling dizzy > 3 miles in but not checking pulse ox despite advice. Walking desaturation test on 12/22/2017 185 feet x 3 laps on ROOM AIR:  did noit desaturate. Rest pulse ox was 100%, final pulse ox was 97%. HR response 66/min at rest to 96/min at peak exertion. Patient Alcario Landin  Did not Desaturate < 88% . Hayden Bevel yes  Desaturated </= 3% points. Gumecindo Delashmit yes did get tachyardic     Walking desaturation test on 02/01/2018 185 feet x 3 laps on ROOM AIR:  did not desaturate. Rest pulse ox was 100%, final pulse ox was 91%. HR response 67/min at rest to 85/min at peak exertion. Patient Sandro Basulto  dod not Desaturate < 88% . Krista Everett yes diod  Desaturated </= 3% points. Dillin Reichart did not get tachyardic   OV 02/01/2018  Chief Complaint  Patient presents with   Follow-up    Pt states he has been doing good since last visit. Started back on OFEV 12/24/17 after taking a holiday off of it.     FU IPF  Took ofev holiday. And then restarted 01/24/18 and doing well. [place]  ofev ok. KBILD ok.   OV 03/16/2018'  . Chief Complaint  Patient presents with   Follow-up    PFT done today. States he has been doing well and denies any current complaints. Tolerating OFEV well and has been walking about 5 miles every day with no complaints.   Ms. Gomillion returns for follow-up of idiopathic pulmonary fibrosis.  He is now back on nintedanib after giving it a holiday for a few weeks.  He is tolerating it well and no problems.  He goes for daily walks and has no problems.  His lung function was reviewed today and it shows for the first time a significant decline of greater than 200 cc between 2 time points.  This correlates with him not taking over for a few weeks nevertheless overall he is happy with his health.  He is not interested in research trials anymore.  He has a new question about travel advice encounter.  He plans an [place] from [place], [place] intra[place]junal in [place] for 10 days.  This will start on May 22, 2018.  He wants to make sure it is safe for him to go.  He said he has never been on a cruise and he and his wife have a strong desire to go as part of his goals of care.   OV 05/16/2018  Chief Complaint  Patient presents with   Follow-up    Breathing is unchanged since last OV.    Tanner Bishop , 75 y.o. , with dob Dec 13, 1946 and male ,Not Hispanic or Latino from [address] [place] [address] - presents to pulm ILD clinic for IPF.  He presents with his wife.  And just under 7 days he is going to embark on a  [place].  This visit is precautionary visit just before that.  Most recent visit was in April 2019.  At this point in time in terms of his IPF he feels stable.  He is compliant with full dose of 5 and is having only mild diarrhea.  Other than that he is tolerating it just fine.  He is looking forward to his cruise.  He had pulmonary function test today that shows an improvement compared to last visit and it is very  similar to summer/fall 2018 in terms of FVC and DLCO.  Although he does feel like he is coming down with an acute bronchitis and wants to take some antibiotic and prednisone I had of the trip.  We also discussed about him having prophylactic antibiotics handy.  He wants a refill on his Tussionex for cough.  At this point in time he is past 5 years of diagnosis and not eligible for research trials he is not interested in transplant although the recent pulmonary fibrosis foundation meeting he did hear from a lot of transplant patients about the individual personal experience.       OV 09/19/2018  Subjective:  Patient ID: Tanner Bishop, male , DOB: [birth year] , age 31 y.o. , MRN: [id number] , ADDRESS: [place] [id number]   09/19/2018 -   Chief Complaint  Patient presents with   Follow-up    Pt saw cards 08/23/18. States he is still having problems with his heart going in and out of rhythm and states he has had some episodes to where he almost passes out. States his breathing is at a stable point. States he also has  an occ cough.     HPI Corydon Harbeson 75 y.o. -presents 5-year follow-up.  I personally saw him in June 2019.  Then approximately a month ago he presented and saw acutely my nurse practitioner because of dizziness.  It was felt to be A. fib RVR.  He subsequently saw Dr. Crissie Sickles his electrophysiologist.  I reviewed the note.  Flecainide was started.  He says despite that l approximately 10 weeks ago he had another episode of dizziness while getting out of the shower.  He was tachycardic with a heart rate of 140s.  He felt he might pass out but there is no focal neurologic deficits or abnormal sensation.  His pulse ox was normal at 97% at that time.  It happened at rest.  He says he has been walking on the treadmill for several miles and he does not desaturate.  He says his pulse ox is 93% at that time.  He never drops into the 80s.  He does not think his dizziness and  palpitation issues are related to his pulmonary fibrosis.  However [name] is wondering about oxygen drops during this time.  He is not tolerating his nintedanib at full dose without any problems.  He has had a successful [place] trip.  He is participating in the patient support group.  He is up-to-date with his flu shot      OV 10/13/2018  Subjective:  Patient ID: Tanner Bishop, male , DOB: 04-Oct-1946 , age 64 y.o. , MRN: [id number] , ADDRESS: [place] [id number]   10/13/2018 -   Chief Complaint  Patient presents with   Follow-up    review PFT.  c/o baseline sob with exertion.       HPI Rudie Mcmannis 75 y.o. -presents for follow-up of IPF to the ILD clinic.  This visit is arranged because letter physiologist [name] was concerned about hypoxemia effects on his atrial fibrillation.  At this point in time patient tells me that after increasing dose of flecainide his atrial fibrillation and palpitations have improved and resolved.  Overall he feels stable.  On September 26, 2018 he had 6-minute walk test and this was pretty robust with walking 384 meters and with lowest pulse ox of 95%.  Then on October 05, 2018 he had overnight pulse oximetry that shows 12 minutes of desaturation less than 88%.  He is not interested in oxygen.  Then he called his October 07, 2018 with worsening bronchitis episodes and we gave him 5 days of prednisone and antibiotics.  He finished his last dose yesterday.  Overall he feels stable but pulmonary function test today shows decline in both FVC and DLCO.  And then when I questioned him he felt like he still had some residual bronchiti and feels another course of prednisone could help him.  There are no other new issues.  Last liver function test October 2019 and was normal.      OV 01/12/2019  Subjective:  Patient ID: Tanner Bishop, male , DOB: 1946/12/02 , age 69 y.o. , MRN: [id number] , ADDRESS: [place]  [id number]   01/12/2019 -   Chief Complaint  Patient presents with   Follow-up    PFT performed today. Pt states he is about the same since last visit.States he still becomes SOB with exertion, has a lot of coughing but has been taking mucinex, and also has had some CP or chest tightness.     HPI Jakevious Lupa 75 y.o. -returns for follow-up of his IPF.  He is now more than 5 years since diagnosis.,  April 2020 he will be 6 years since diagnosis.  He continues to exercise well on the treadmill and according to his wife he is does well.  He is not dropping oxygen at this time.  He does not have a cough when needed works on the tile.  However at other times the day especially when he is trying to lie down to bed or when he gets up in the morning and goes to the bathroom he has really bad cough.  Overall the cough severity is now 6 out of 10 in terms of how it is impacting his quality of life.  He tells me that every time he takes prednisone for the cough he feels better but then when he comes off prednisone cough gets worse.  He said multiple prednisone courses of brief duration for this chronic cough.  In addition for the last few weeks has had yellow sinus drainage and sinus headache and sinus congestion and he feels this is again making his chronic cough worse.  He is open to taking antibiotic and prednisone course.  In terms of taking nintedanib he has no side effects.  His last liver function test reviewed was in October 2019 and it was normal at that time.  Overall at this point in time he really wants good significant support for his cough in terms of affecting his quality of life.    ROS - per HPI     OV 02/16/2019  Subjective:  Patient ID: Tanner Bishop, male , DOB: 10/06/1946 , age 6 y.o. , MRN: [id number] , ADDRESS: [address] [place] [address]   02/16/2019 -   Chief Complaint  Patient presents with   Follow-up    HRCT  and sinus CT performed 2/18. Pt states he is about the same  as last visit and states SOB is about the same. Pt still has a dry cough. Denies any complaints of CP/chest tightness.     HPI Jaremy Midgley 75 y.o. -returns for follow-up of his IPF.  He presents with his wife.  He is here to review the test results.  He had high-resolution CT chest and CT sinus.  These were done in order to reevaluate his disease because he is having significant cough.  His high-resolution CT chest shows only mild progression in his IPF in 3 years.  However there is significant new findings of tracheobronchomalacia although there is no lung cancer.  He also has two-vessel coronary artery calcification.  These are new findings.  Since last visit and a sinus episode he is stable.  He is currently run out of his nasal steroids.  He says the pharmacy said that I declined his prescription request.  I do not recollect such a conversation.  In any event he is now going to participate in the cough study called scenic.  He has received a copy of the consent form.  He is rated.  He has an appointment for his consent visit.  It is possible that the nasal steroid is on the exclusion list but I do not have the exclusion list with me.  Anyway he is not consented.  He is not having chest pain when he does his treadmill exercises.  He works out hard.  He states he gets tachycardic.  He says he has a 6 times that he will die from his heart condition before his pulmonary fibrosis.  He has upcoming appointment with his cardiologist [name] for atrial fibrillation.  ...................................................................... OV 06/08/2019   Subjective:  Patient ID: Tanner Bishop, male , DOB: [birth year] , age 50 y.o. , MRN: [id number] , ADDRESS: [place] [id number]   06/08/2019 -   Chief Complaint  Patient presents with   Follow-up    1wk f/u for IPF. Patient stated that he was given a round of prednisone last week for some SOB but is feeling much better.      HPI Demorris  Coletta 75 y.o. -returns for IPF follow-up.  Last seen in March 2020.  Since the pandemic started he has been social distancing.  He has not attended any support group.  He has been tolerating his Ofev quite well without any difficulty.  Recently had a bronchitis exacerbation and we gave prednisone.  This resolved his cough.  He was going to participate in a cough study but because of the pandemic the study got canceled.  He tells me that every time he takes prednisone the cough goes away.  He is very interested in taking daily prednisone for symptom relief of cough.  We discussed the side effects of prednisone that include weight gain, easy bruising, adrenal insufficiency, osteoporosis, cataracts hypertension, diabetes, immunosuppression.  Despite all this he wants to take a low-dose prednisone daily.  His last liver function test was in March 2020.  This needs to be retested because he is on nintedanib.  His symptom scores at walk test show relative stability.  His last pulmonary function test was in January 2020 but because of the pandemic is on hold.  We are using symptom and walk test to determine progression.         OV 04/11/2020  Subjective:  Patient ID: Tanner Bishop, male , DOB: 01/23/1946 ,  age 55 y.o. , MRN: [id number] , ADDRESS: [address] [place] [address]   04/11/2020 -   Chief Complaint  Patient presents with   Follow-up    PFT performed today. Pt states he feels like his breathing is gradually becoming worse and states it could be at any time that he is SOB.   #IPF - uip path 03/22/13  - On OFEV since early May 2015 -On chronic daily prednisone because of cough since 2020  HPI Chanse Lesiak 75 y.o. -presents with his wife IPF follow-up.  Last saw him June 2020.  After that he feels his dyspnea is worse.  He says that he could do several miles in one 1 hour and 30 minutes on the treadmill.  The same distance and now taking 1 hour and 40 minutes.  He feels his disease is  getting worse.  He says the prednisone is working well for him because of improved cough and wellbeing.  He is having some skin bruising because of that.  The nintedanib he is tolerating well except for some mild diarrhea.  On objective symptom score is actually similar to 1 year ago but may be slightly worse than 6 months ago or 9 months ago.  On pulmonary function testing he is stable compared to 1 year ago but slow and steadily he is definitely worse over the many years.  We noticed on his walking desaturation test that he did not mount a tachycardic response as he is done in the past.  He is on flecainide for atrial fibrillation.  He takes his 3 times daily.  He feels his atrial fibrillation is under good control.  There is no chest pain.  There is no weight loss.  Is no loss of physical conditioning.  He is interested in clinical trials.   A month ago he had nausea and his amylase/lipase was elevated.  He says he is fine now.  Primary care address this. ROS - per HPI  IMPRESSION: 1. 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 Findings are consistent with UIP per consensus guidelines: Diagnosis of Idiopathic Pulmonary Fibrosis: An Official ATS/ERS/JRS/ALAT Clinical Practice Guideline. [place], Iss 5, [phone], Aug 14 2017. 2. 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 3. Two-vessel coronary atherosclerosis.   Aortic Atherosclerosis (ICD10-I70.0).     Electronically Signed   By: Ilona Sorrel M.D.   On: 01/31/2019 12:24  OV 06/11/2020  Subjective:  Patient ID: Tanner Bishop, male , DOB: [birth year] , age 62 y.o. , MRN: [id number] , ADDRESS: [place] [id number]   06/11/2020 -   Chief Complaint  Patient presents with   Follow-up    PFT performed today.  Pt states he has been doing good since last visit. States  breathing is about the same.,    #IPF - uip path 03/22/13  - On OFEV since early May 2015 -On chronic daily prednisone because of cough since 2020  HPI Leyton Jenniges 75 y.o. -returns to clinic for ILD follow-up.  In the interim he feels that his wife has worsening dementia.  She is able to self-care but only does minimal cooking.  She is more irritable.  His daughter Theadora Rama is now on disability because of back issues.  Patient has a son in [place] who he is close with.  Together they take care of his wife.  He says  he has become the primary caregiver now for his wife.  Nevertheless she is functional and does some ADLs.  He continues with nintedanib 150 mg twice daily.  Overall tolerance is good just mild diarrhea.  This is baseline.  Shortness of breath is baseline symptom score is 14 and similar to the past.  Pulmonary function test appears stable.  Walking desaturation test is stable.  His last liver function test was 2 months ago.  His main issue is that he is having some financial issues covering the cost of nintedanib.  He $6000 grant is running out.  He only has 1 month supply left.  He wants to meet with the pharmacist.      OV 11/12/2020   Subjective:  Patient ID: Tanner Bishop, male , DOB: [birth year], age 4 y.o. years. , MRN: [id number],  ADDRESS: [place] [id number] PCP  [name], MD Providers : Treatment Team:  Attending Provider: Brand Males, MD Patient Care Team: [name], MD as PCP - General Brand Males, MD as Consulting Physician (Pulmonary Disease) [place], Anne Ng, [facility] as Pharmacist (Pharmacist)    Chief Complaint  Patient presents with   Follow-up    IPF, still getting SOB on DOE with some chest tightness       HPI Eban Bridgewater 75 y.o. -IPF follow-up.  Presents with his wife who I am seeing after a long time.  According to the patient patient himself is stable from IPF standpoint.  However he says for the last  several months he has had an exacerbation in his chronic back pain.  He has significant pain.  He has had steroid injections.  The first 1 was successful but the second 1 and subsequent failed.  He is not able to do his walks as he used to in the past.  He has gained some weight.  He is frustrated by this.  He is asking if he can go see a Restaurant manager, fast food.  The other issue with him was elevated amylase.  We never found a cause for this.  So his primary care physician worked him up he had abdominal CT imaging on 08/16/2020 and this was normal.  Otherwise he is okay  He is asking for a refill on his prednisone that he takes for cough.  He also wants Tussionex as needed.  He takes that for cough.  He also says that he got summoned for jury duty.  He is wondering what to do.  I told him under no circumstances he should ever do jury duty.  I worry about the risk   ROS - per HPI  OV 01/24/2021  Subjective:  Patient ID: Tanner Bishop, male , DOB: [birth year] , age 8 y.o. , MRN: [id number] , ADDRESS: [address] [place] [address] PCP [name], MD Patient Care Team: [name], MD as PCP - General Brand Males, MD as Consulting Physician (Pulmonary Disease) [place]antiago, Anne Ng, [facility] as Pharmacist (Pharmacist)  This Provider for this visit: Treatment Team:  Attending Provider: Brand Males, MD    01/24/2021 -   Chief Complaint  Patient presents with   Acute Visit    Increased SOB since last Friday, heart still feels "funny"   Follow-up idiopathic pulmonary fibrosis On nintedanib and low-dose prednisone for cough Idiopathic elevation in amylase unrelated to nintedanib.  Normal abdominal imaging.  HPI Dejion Rihn 75 y.o. -returns for an acute visit.  On Friday, 01/22/2021 he says he got up and he felt  like baseline and then when he walked he suddenly noticed that he was suddenly way more short of breath than usual.  His heart started feeling funny.  He took an acute visit with  cardiology.  I reviewed the notes.  I do not see an EKG but he tells me an EKG was done and he was in sinus rhythm.  He had normal troponin, normal BNP and normal D-dimer.  Chest x-ray was baseline.  I personally visualized this.  Therefore he was sent to pulmonary.  We are seeing him as an acute visit today.  His symptom scores are definitely worse than baseline.  He is tolerating his nintedanib and his weight is stable.  His walking desaturation test shows a sudden decline.  For the first time his pulse ox is below 88%.  His pace of walking was much slower.  He tells me he still feels funny in his chest.  There are no Covid symptoms.  He is fully vaccinated.  There is no edema orthopnea proximal nocturnal dyspnea coughing or wheezing.  There is no sputum or fever production.  His wife is here with him..        CT Chest data  DG Chest 2 View  Result Date: 01/23/2021 CLINICAL DATA:  Shortness of breath cardiac palpitations EXAM: CHEST - 2 VIEW COMPARISON:  12/01/2016 FINDINGS: Cardiac shadow is at the upper limits of normal in size but stable. Lungs are well aerated bilaterally with mild fibrotic scarring stable in appearance from the prior exam. No new focal infiltrate or sizable effusion is seen. No acute bony abnormality is noted. IMPRESSION: Chronic scarring without acute abnormality. Electronically Signed   By: Inez Catalina M.D.   On: 01/23/2021 08:24   CT Chest High Resolution  Result Date: 01/24/2021 CLINICAL DATA:  IPF EXAM: CT CHEST WITHOUT CONTRAST TECHNIQUE: Multidetector CT imaging of the chest was performed following the standard protocol without intravenous contrast. High resolution imaging of the lungs, as well as inspiratory and expiratory imaging, was performed. COMPARISON:  01/31/2019, 03/11/2016, 07/24/2015, 02/14/2013 FINDINGS: Cardiovascular: Aortic atherosclerosis. Cardiomegaly. Scattered left coronary artery calcifications. Enlargement of the main pulmonary artery measuring  up to 3.4 cm in caliber. No pericardial effusion. Mediastinum/Nodes: No enlarged mediastinal, hilar, or axillary lymph nodes. Thyroid gland, trachea, and esophagus demonstrate no significant findings. Lungs/Pleura: Redemonstrated moderate pulmonary fibrosis in a pattern with apical to basal gradient, featuring traction bronchiectasis, irregular peripheral interstitial opacity, septal thickening, subpleural bronchiolectasis, and areas of honeycombing at the lung bases. Fibrotic findings are minimally worsened compared to prior examination dated 01/31/2019 and further slightly worsened over a longer period of follow-up dating back to 02/14/2013. No significant air trapping on expiratory phase imaging. Evidence of prior left lung wedge biopsy. No pleural effusion or pneumothorax. Upper Abdomen: No acute abnormality. Musculoskeletal: No chest wall mass or suspicious bone lesions identified. IMPRESSION: 1. Redemonstrated moderate pulmonary fibrosis in a pattern with apical to basal gradient, featuring traction bronchiectasis, irregular peripheral interstitial opacity, septal thickening, subpleural bronchiolectasis, and areas of honeycombing at the lung bases. Fibrotic findings are minimally worsened compared to prior examination dated 01/31/2019 and further slightly worsened over a longer period of follow-up dating back to 02/14/2013. Findings are in a UIP pattern and in keeping with reported diagnosis of IPF. 2. Cardiomegaly and coronary artery disease. 3. Enlargement of the main pulmonary artery, as can be seen in pulmonary hypertension. Aortic Atherosclerosis (ICD10-I70.0). Electronically Signed   By: Eddie Candle M.D.   On: 01/24/2021 09:16  OV 03/11/2021  Subjective:  Patient ID: Tanner Bishop, male , DOB: [birth year] , age 43 y.o. , MRN: [id number] , ADDRESS: [address] [place] [address] PCP [name], MD Patient Care Team: [name], MD as PCP - General Brand Males, MD  as Consulting Physician (Pulmonary Disease) Viona Gilmore, [facility] as Pharmacist (Pharmacist)  This Provider for this visit: Treatment Team:  Attending Provider: Brand Males, MD    03/11/2021 -   Chief Complaint  Patient presents with   Follow-up    Follow up after PFT.  Pt stated that he has the oxygen but these are the larger tanks.  He is waiting to get the POC from [facility].    Follow-up idiopathic pulmonary fibrosis  - On nintedanib and low-dose prednisone for cough  -Start portable oxygen for progressive disease March 2022  Idiopathic elevation in amylase unrelated to nintedanib.  Normal abdominal imaging.   Normal right heart catheterization 02/21/2021 by Dr. Glori Bickers.    - Mean pressure 17.  Wedge pressure 6.  Cardiac index 2.6.  PVR 2.1  HPI Duncan Ahmed 75 y.o. -presents for follow-up since his last visit when we noticed exertional hypoxemia.  Got echocardiogram and then sent him to cardiology.  He had right heart catheterization.  The result is reviewed and is normal.  He is tolerating his nintedanib fine.  He has had problems qualifying for oxygen even though he desaturated.  He says something about insurance did not approve my order.  He is also frustrated with [facility].  He wants to switch to [facility] [place].  He is interested in clinical trial but current phase 3 trials are on hold because of logistic issues with sponsor STARSC[employer] study. Other studies might be available      OV 06/13/2021  Subjective:  Patient ID: Tanner Bishop, male , DOB: 1946/03/17 , age 4 y.o. , MRN: [id number] , ADDRESS: [address] [place] [address] PCP [name], MD Patient Care Team: [name], MD as PCP - General Lovena Le Champ Mungo, MD as PCP - Cardiology (Cardiology) Brand Males, MD as Consulting Physician (Pulmonary Disease) Viona Gilmore, [facility] as Pharmacist (Pharmacist)  This Provider for this visit: Treatment Team:  Attending Provider: Brand Males, MD    06/13/2021 -   Chief Complaint  Patient presents with   Follow-up    PFT  performed 6/30 and cxr performed today. Pt states he feels like he is worse since last visit and states he is also coughing more too. Pt did have Covid and since then he has been worse.      HPI Azzam Brien 75 y.o. -presents for follow-up.  This is unscheduled visit.  He tells me that he is scheduled himself for low back surgery because of worsening sciatica with Dr. Kathyrn Sheriff.  This visit was to be done in the outpatient suite.  When he showed up preoperatively the anesthesiologist rejected surgical fitness because of crackles and because anesthesiologist felt there was pedal edema.  Patient himself denies any pedal edema.  [In fact on exam he does not have pedal edema].  He says he was frustrated with experience because cardiology had cleared him for the procedure.  He also reports that crackles were felt to be due to heart failure although he has pulmonary fibrosis.  In talking to him he did have COVID at the end of May 2022.  After that he is slightly worse.  His FVC shows slight decline but  his DLCO is stable.  Try to do a walk test on him but because of his sciatica he could not walk.  He says he uses oxygen with exercise and mowing yard.  He is really bothered by his pain.  He was wondering about nonsurgical maneuvers to help his pain.  We talked about Tylenol but within limits.  We talked about chiropractor but he says it did not help him.  We talked about dry needling.  He took a recommendation from me or someone that I personally use for dry needling.  However he thinks he will just proceed with surgery.  I discussed Dr. Kathyrn Sheriff and Dr. Kathyrn Sheriff we will plan the surgery in the hospital.  He wants refill of his cough medication.  His subjective symptom score is stable.  His chest x-ray looks stable.  He will have lab blood work today.        OV 08/14/2021  Subjective:  Patient ID: Tanner Bishop,  male , DOB: 12-Jul-1946 , age 73 y.o. , MRN: [id number] , ADDRESS: [place] [address] PCP [name], MD Patient Care Team: [name], MD as PCP - General Lovena Le Champ Mungo, MD as PCP - Cardiology (Cardiology) Brand Males, MD as Consulting Physician (Pulmonary Disease) Viona Gilmore, [facility] as Pharmacist (Pharmacist)  This Provider for this visit: Treatment Team:  Attending Provider: Brand Males, MD  Follow-up idiopathic pulmonary fibrosis  - On nintedanib and low-dose prednisone for cough  -Start portable oxygen for progressive disease March 2022  Idiopathic elevation in amylase unrelated to nintedanib.  Normal abdominal imaging.   Normal right heart catheterization 02/21/2021 by Dr. Glori Bickers.    - Mean pressure 17.  Wedge pressure 6.  Cardiac index 2.6.  PVR 2.1  08/14/2021 -   Chief Complaint  Patient presents with   Follow-up    Pt states that his breathing has become worse since last visit.      HPI Wesly Shuffield 75 y.o. -returns for follow-up.  Approximately 2 weeks ago on 07/30/2021 he had a spinal surgery.  Surgery went well.  After that the back pain is improved and he sustained improvement.  His effort tolerance then improved but in the last 1 week he is progressively more short of breath.  He says he uses 2 L of nasal cannula oxygen at rest and 3 L with exertion but he states he is unable to do the things he used to even a week ago.  There is no associated worsening edema.  No orthopnea no paroxysmal nocturnal dyspnea.  No chest pain.  No hemoptysis no fever no chills no worsening cough.  He is taking his prednisone and nintedanib correctly.  He says the shortness of breath is so worse that he worries that he is going to die pretty soon.  His weight is stable.  He is unclear if he is desaturating but he did notice that he is having fluctuating heart rate.  Intermittently have significant bradycardia to 40.  His ILD symptom score  shows significant worsening.    CT     OV 09/11/2021  Subjective:  Patient ID: Tanner Bishop, male , DOB: [birth year] , age 75 y.o. , MRN: [id number] , ADDRESS: [address] [place] [address] PCP [name], MD Patient Care Team: [name], MD as PCP - General Lovena Le Champ Mungo, MD as PCP - Cardiology (Cardiology) Brand Males, MD as Consulting Physician (Pulmonary Disease) Viona Gilmore, [facility] as Pharmacist (  Pharmacist)  This Provider for this visit: Treatment Team:  Attending Provider: Brand Males, MD    09/11/2021 -   Chief Complaint  Patient presents with   Follow-up    Pt states his breathing is about the same since last visit.   Follow-up idiopathic pulmonary fibrosis  - On nintedanib and low-dose prednisone for cough  -Start portable oxygen for progressive disease March 2022 - >  hypoxemic at rest sept 2022  Idiopathic elevation in amylase unrelated to nintedanib.  Normal abdominal imaging.   Normal right heart catheterization 02/21/2021 by Dr. Glori Bickers.    - Mean pressure 17.  Wedge pressure 6.  Cardiac index 2.6.  PVR 2.1  Covid may 2022 paxlovid  Spin Surgery - aug 2022  HPI Rober Skeels 75 y.o. -acute visit.  After his last visit he did see cardiology.  Its he appears that his atrial fibrillation is okay.  He then called the support leader Marlane Mingle for the pulmonary fibrosis support group and was complaining about his worsening shortness of breath.  Mr. Hildred Alamin called me and I was asked to see the patient in short order.  But the patient tells me that compared to the last month he is the same.  Nevertheless significant worsening in dyspnea.  In July 2022 before the spine surgery he could walk a mile or more.  Now even with 2 L nasal cannula walking to the mailbox and back is making him very dyspneic.  He is not able to do a lot of activities of daily living easily.  It used to taking 5 minutes to put his clothes on and finish  her shower.  Now is taking 20 minutes.  He seems of lost some weight.  He continues on his nintedanib.  His echocardiogram was unrevealing.  He has had prednisone but this is not helping.        Narrative & Impression  CLINICAL DATA:  75 year old male with history of interstitial lung disease. Follow-up examination.   EXAM: CT CHEST WITHOUT CONTRAST   TECHNIQUE: Multidetector CT imaging of the chest was performed following the standard protocol without intravenous contrast. High resolution imaging of the lungs, as well as inspiratory and expiratory imaging, was performed.   COMPARISON:  Chest CT 01/24/2021.   FINDINGS: Cardiovascular: Heart size is mildly enlarged. There is no significant pericardial fluid, thickening or pericardial calcification. There is aortic atherosclerosis, as well as atherosclerosis of the great vessels of the mediastinum and the coronary arteries, including calcified atherosclerotic plaque in the left main, left anterior descending and left circumflex coronary arteries.   Mediastinum/Nodes: No pathologically enlarged mediastinal or hilar lymph nodes. Please note that accurate exclusion of hilar adenopathy is limited on noncontrast CT scans. Esophagus is unremarkable in appearance. No axillary lymphadenopathy.   Lungs/Pleura: High-resolution images again demonstrate some areas of ground-glass attenuation, septal thickening, thickening of the peribronchovascular interstitium, cylindrical bronchiectasis, peripheral bronchiolectasis and some mild honeycombing. Findings have a definitive craniocaudal gradient and appear mildly progressive compared to the prior examination. Inspiratory and expiratory imaging is unremarkable. No acute consolidative airspace disease. No pleural effusions. No definite suspicious appearing pulmonary nodules or masses are confidently identified upon this background of interstitial lung disease. Some tiny  calcified granulomas are noted in the left lower lobe.   Upper Abdomen: Aortic atherosclerosis.   Musculoskeletal: There are no aggressive appearing lytic or blastic lesions noted in the visualized portions of the skeleton.   IMPRESSION: 1. The appearance of the lungs is compatible with  interstitial lung disease, with a spectrum of findings considered diagnostic of usual interstitial pneumonia (UIP) per current ATS guidelines, as detailed above. Minimal progression of disease compared to the prior study. 2. Aortic atherosclerosis, in addition to left main and 2 vessel coronary artery disease. Please note that although the presence of coronary artery calcium documents the presence of coronary artery disease, the severity of this disease and any potential stenosis cannot be assessed on this non-gated CT examination. Assessment for potential risk factor modification, dietary therapy or pharmacologic therapy may be warranted, if clinically indicated. 3. Mild cardiomegaly.     Electronically Signed   By: Vinnie Langton M.D.   On: 08/20/2021 13:48       IMPRESSIONS     1. Left ventricular ejection fraction by 3D volume is 59 %. The left  ventricle has normal function. The left ventricle has no regional wall  motion abnormalities. There is mild left ventricular hypertrophy. Left  ventricular diastolic pa strain is 24.5 %.   2. Right ventricular systolic function is mildly reduced. The right  ventricular size is mildly enlarged. rameters are  consistent with Grade I diastolic dysfunction (impaired relaxation). The  average left ventricular global longitudinalThere is mildly elevated pulmonary  artery systolic pressure.   3. Left atrial size was moderately dilated.   4. The mitral valve is degenerative. Mild to moderate mitral valve  regurgitation. No evidence of mitral stenosis.   5. The aortic valve is normal in structure. Aortic valve regurgitation is  not visualized. No  aortic stenosis is present.   6. The inferior vena cava is normal in size with greater than 50%  respiratory variability, suggesting right atrial pressure of 3 mmHg.   Comparison(s): No significant change from prior study. Prior images  reviewed side by side.     OV 09/24/2021  Subjective:  Patient ID: Tanner Bishop, male , DOB: 17-Jul-1946 , age 89 y.o. , MRN: [id number] , ADDRESS: [place] [address] PCP [name], MD Patient Care Team: [name], MD as PCP - General Lovena Le Champ Mungo, MD as PCP - Cardiology (Cardiology) Brand Males, MD as Consulting Physician (Pulmonary Disease) Viona Gilmore, [facility] as Pharmacist (Pharmacist)  This Provider for this visit: Treatment Team:  Attending Provider: Brand Males, MD    09/24/2021 -   Chief Complaint  Patient presents with   Follow-up    PFT performed today.  Pt states he has been doing okay since last visit. States he may be some better.    Follow-up idiopathic pulmonary fibrosis  - On nintedanib and low-dose prednisone for cough  -Start portable oxygen for progressive disease March 2022 - >  hypoxemic at rest sept 2022  Idiopathic elevation in amylase unrelated to nintedanib.  Normal abdominal imaging.   Normal right heart catheterization 02/21/2021 by Dr. Glori Bickers.    - Mean pressure 17.  Wedge pressure 6.  Cardiac index 2.6.  PVR 2.1  Covid may 2022 paxlovid  HPI Jourdon Nieves 75 y.o. - Seen for follow-up.  This visit is to see his pulmonary function test.  Shows dramatic decline in pulmonary function test FVC decline greater than 10% just in the last 3 months.  DLCO also has declined.  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  He states he is now accepted and come to terms with this decline.  Therefore he feels a little bit better.  His symptom score  is roughly the same.  Last visit I referred him for  repeat right heart catheterization.  He says he still has not heard from them.  He is very interested in pursuing this approach even though the most recent right heart catheterization was in March 2022.  This is because his hypoxemia onset is since then and if there is increased pulmonary artery pressures then you will qualify for standard of care in treprostinil.  We discussed clinical trial as a care option.  Currently IV infusion study based from media [place] is close to enrollment internationally.  He could be eligible for inhaled nitric oxide versus placebo study that looks at improved mobility.  Previously because of his back issues and ongoing schedule issues he declined.  He is also dealing with estrangement with his daughter and his wife having advanced dementia and a sister-in-law passing away.  He could also be potentially eligible for phase 3 inhaled treprostinil in patients with IPF but without pulmonary hypertension.  However he wants to undergo standard of critical care procedures first.  Therefore have written a to Dr. Loralie Champagne and Dr. Glori Bickers about right heart catheterization.  Of note: For the last week he is having some increased cough with yellow sputum.  He is requesting a repeat antibiotic and prednisone burst.  He is on daily prednisone 5 mg for cough.  He is requesting a refill on his chronic opioids which I did.         SYMPTOM SCALE - ILD 02/16/2019  06/08/2019  04/11/2020  06/11/2020  11/12/2020 169# weight 01/24/2021 171# 03/11/2021 175# 06/13/2021 174# - covid end emay 2022 08/14/2021 172# 09/11/2021 167# 09/24/2021 169#  O2 use _0  ra ra Uses o2 with exercise. Mowing yard 2L rest, 3L exertion 2 L rest, 3l exertion 2-4L  2Shortness of Breath 0 -> 5 scale with 5 being worst (score 6 If unable to do)            2At rest 1 1 0 _1 Simple tasks - showers, clothes change, eating, shaving _2 Household (dishes, doing  bed, laundry) _3 Shopping _4 Walking level at own pace _5 Walking up Stairs _6 Total (40 - 48) Dyspnea Score _7 couyg 3 Xx - due to recent pred _8 Did not answer 1 3  How bad is your fatigue 3 x _9 0 _10 nausea   0 0 0 0 0 0 0 0 0  vomit   0 0 0 0 0 0 0 0 0  diarrhea   _11 0 2  anxiety   0 0 0 0 0 0 1 00 0  deopresso   0 0 0 0 0 0 0 0 0       Simple office walk 185 feet x  3 laps goal with forehead probe 09/19/2018  01/12/2019  02/16/2019  06/08/2019  04/11/2020  06/11/2020  11/12/2020  01/24/2021  03/11/2021  08/14/2021  09/11/2021  O2 used Room air Room air Room air Room air  ra ra ra ra 2L [place] Walk RA 86%  Number laps completed _0 of 2 laps only .f   Comments about pace normal normal normal normal  Mod pace avg [ace Slow pace     Resting Pulse Ox/HR 100% and 73/min 100% ad 74/min 100% and 68/min 98% and 82/min 99% and 60/min 98% and 54 98% and 73/min 94% and 88/min 97% RA at rest 97% and HR 85 97% 2L [place] at rest, HR 73  Final Pulse Ox/HR 93% and 85/min 92% and 84/min 93% and 81/min 91% and 96/mni 91% and 74/mm 90% and 71/min 90% and 86/min 87% and 95/min desats to 87% on 2nd lap 93% ad HR 92 aat 1 lap and felt weak 4L [place] did all 3 laps - maintainted > 88%  Desaturated </= 88% no no no no yes yes yes yes     Desaturated <= 3% points Yes, 7 poin Yes, 8 ponts Yes, 7 points Yes, 7 points Yes, 8point Yes, 8 point Yes, 8 points Yes, 7 points     Got Tachycardic >/= 90/min no no no yes         Symptoms at end of test x none none none dyspnea none None dyspnea dyspnea  weak   Miscellaneous comments x x stable stable but tachy for first time     Corrected with 3L [place] - just 1 lap. Not all 3 laps tested on 3L  Premature stopping wihtout desats     PFT  PFT Results Latest Ref Rng & Units 09/24/2021 06/12/2021 03/11/2021 06/11/2020 04/11/2020  01/12/2019 10/13/2018  FVC-Pre L 1.86 1.97 2.10 2.26 2.23 2.21 2.25  FVC-Predicted Pre % 48 50 53 57 56 56 57  FVC-Post L - - - - - 2.25 -  FVC-Predicted Post % - - - - - 57 -  Pre FEV1/FVC % % 90 89 86 88 89 87 86  Post FEV1/FCV % % - - - - - 90 -  FEV1-Pre L 1.68 1.74 1.81 2.00 1.99 1.93 1.93  FEV1-Predicted Pre % 60 62 64 70 69 67 67  FEV1-Post L - - - - - 2.03 -  DLCO uncorrected ml/min/mmHg 10.95 13.83 11.41 13.69 14.72 14.38 14.17  DLCO UNC% % 46 59 48 58 62 50 50  DLCO corrected ml/min/mmHg 11.02 13.83 11.51 13.69 14.76 - -  DLCO COR %Predicted % 47 59 48 58 62 - -  DLVA Predicted % 96 114 89 96 110 102 94  TLC L - - - - - - -  TLC % Predicted % - - - - - - -  RV % Predicted % - - - - - - -       has a past medical history of Allergy, Arthritis, Atrial flutter (HCC), Cataract, Dysrhythmia, GERD (gastroesophageal reflux disease), H/O hiatal hernia, History of kidney stones, Hyperlipidemia, Injury of right hand, Pulmonary fibrosis ([place]), Renal stone (06/2014), and Ulcer.   reports that he quit smoking about 42 years ago. His smoking use included cigarettes. He has a 16.00 pack-year smoking history. He has never used smokeless tobacco.  Past Surgical History:  Procedure Laterality Date   ATRIAL FLUTTER ABLATION N/A 04/08/2015   Procedure: ATRIAL FLUTTER ABLATION;  Surgeon: Evans Lance, MD;  Location: [facility] CATH LAB;  Service: Cardiovascular;  Laterality: N/A;   CATARACT EXTRACTION, BILATERAL  2016   COLONOSCOPY  03/01/2017   per Dr. Loletha Carrow, clear, repeat in 5 yrs (brother had colon cancer)    ESOPHAGOGASTRODUODENOSCOPY  2007   EXTRACORPOREAL SHOCK WAVE LITHOTRIPSY  06/17/2014   per Dr. Jeffie Pollock    HAND SURGERY Right    LUMBAR LAMINECTOMY/DECOMPRESSION MICRODISCECTOMY Left 06/26/2021   Procedure: MICRODISCECTOMY LUMBAR FOUR-LUMBAR FIVE;  Surgeon: Consuella Lose, MD;  Location: [place];  Service: Neurosurgery;  Laterality: Left;   LUNG BIOPSY Left 03/22/2013   Procedure: LUNG  BIOPSY;  Surgeon: Melrose Nakayama, MD;  Location: [place];  Service: Thoracic;  Laterality: Left;   POLYPECTOMY     RIGHT HEART CATH N/A 02/21/2021   Procedure: RIGHT HEART CATH;  Surgeon: Jolaine Artist, MD;  Location: [facility];  Service: Cardiovascular;  Laterality: N/A;   TONSILLECTOMY     UPPER GASTROINTESTINAL ENDOSCOPY     VIDEO ASSISTED THORACOSCOPY Left 03/22/2013   Procedure: VIDEO ASSISTED THORACOSCOPY;  Surgeon: Melrose Nakayama, MD;  Location: [facility];  Service: Thoracic;  Laterality: Left;    No Known Allergies  Immunization History  Administered Date(s) Administered   Fluad Quad(high Dose 65+) 08/25/2019   Influenza Split 09/13/2013   Influenza, High Dose Seasonal PF 10/06/2016, 09/16/2017, 08/17/2018, 09/01/2021   Influenza-Unspecified 09/07/2014, 09/14/2015, 09/13/2020   PFIZER(Purple Top)SARS-COV-2 Vaccination 02/12/2020, 03/12/2020, 08/14/2020   Pfizer Covid-19 Vaccine Bivalent Booster 16yr & up 09/16/2021   Pneumococcal Conjugate-13 02/16/2017   Pneumococcal Polysaccharide-23 05/26/2013   Td 12/15/2007   Tdap 02/23/2018   Zoster Recombinat (Shingrix) 10/13/2018, 07/25/2019   Zoster, Live 02/19/2014    Family History  Problem Relation Age of Onset   Liver cancer Mother    Diabetes Sister    Lung cancer Sister    Heart disease Brother    Diabetes Brother    Colon cancer Brother    Diabetes Other    Cancer Other        prostate   Colon polyps Neg Hx    Esophageal cancer Neg Hx    Rectal cancer Neg Hx    Stomach cancer Neg Hx      Current Outpatient Medications:    albuterol (VENTOLIN HFA) 108 (90 Base) MCG/ACT inhaler, Inhale 1-2 puffs into the lungs every 6 (six) hours as needed for wheezing or shortness of breath., Disp: 18 g, Rfl: 1   flecainide (TAMBOCOR) 50 MG tablet, Take 1 tablet (50 mg total) by mouth 3 (three) times daily., Disp: 270 tablet, Rfl: 6   fluticasone (FLONASE) 50 MCG/ACT nasal spray, Place 2 sprays into both  nostrils daily as needed for allergies., Disp: , Rfl:    Nintedanib (OFEV) 150 MG CAPS, Take 1 capsule (150 mg total) by mouth 2 (two) times daily., Disp: , Rfl:    pantoprazole (PROTONIX) 40 MG tablet, TAKE 1 TABLET BY MOUTH TWICE A DAY, Disp: 180 tablet, Rfl: 2   predniSONE (DELTASONE) 5 MG tablet, Take 1 tablet (5 mg total) by mouth daily with breakfast., Disp: , Rfl:    zolpidem (AMBIEN) 5 MG tablet, Take 1 tablet (5 mg total) by mouth at bedtime as needed for sleep., Disp: , Rfl:    chlorpheniramine-HYDROcodone (TUSSIONEX PENNKINETIC ER) 10-8 MG/5ML SUER, Take 5 mLs by mouth 2 (two) times daily as needed for cough (PRN, cough, ipf, chronic cough, palliation)., Disp: 140 mL, Rfl: 0  Current Facility-Administered Medications:    chlorpheniramine-HYDROcodone (TUSSIONEX) 10-8 MG/5ML suspension 5 mL, 5 mL, Oral, Q12H PRN, RBrand Males MD      Objective:   Vitals:  09/24/21 1112  BP: 116/64  Pulse: 61  Temp: (!) 97.1 F (36.2 C)  TempSrc: Oral  SpO2: 99%  Weight: 169 lb (76.7 kg)  Height: 5' 7.5" (1.715 m)    Estimated body mass index is 26.08 kg/m as calculated from the following:   Height as of this encounter: 5' 7.5" (1.715 m).   Weight as of this encounter: 169 lb (76.7 kg).  _0 @  Filed Weights   09/24/21 1112  Weight: 169 lb (76.7 kg)     Physical Exam General: No distress. Looks well Neuro: Alert and Oriented x 3. GCS 15. Speech normal Psych: Pleasant Resp:  Barrel Chest - no.  Wheeze - no, Crackles - yes, No overt respiratory distress CVS: Normal heart sounds. Murmurs - no Ext: Stigmata of Connective Tissue Disease - no HEENT: Normal upper airway. PEERL +. No post nasal drip        Assessment:       ICD-10-CM   1. IPF (idiopathic pulmonary fibrosis) ([place])  J84.112     2. Chronic respiratory failure with hypoxia ([facility])  J96.11     3. Chronic cough  R05.3     4. Acute bronchitis, unspecified organism  J20.9          Plan:      Patient Instructions     ICD-10-CM   1. IPF (idiopathic pulmonary fibrosis) ([place])  J84.112     2. Chronic respiratory failure with hypoxia (H[facility])  J96.11     3. Chronic cough  R05.3     4. Acute bronchitis, unspecified organism  J20.9         -You are having dramatic decline in your shortness of breath and symptoms following spinal surgery on 07/30/2021 and through 09/11/2021. Lung function test shows 10-15% decline in lung functio between June 2022 and 09/24/2021. There definitely is worsening  IPF therefore   - now needing 2L O2 even a trest  - currently also mild acute bronchitis (possible)  Plan - 2L o2 rest, 4L [place] o2 with exertion - keep pulse ox > 88% at home with exertion - re-refer Dr Haroldine Laws for Right heart cath - if pressurs up we can consider tyvaso nebulizer - I have messaged them  - continue Ofev  - if pulm artery pressures normal, then can consider TETON (inhaled tyvaso - above) study for IPF or inhaled Nitric Oxide study  - refill opioid cough syrup  - for bronchitis - Take prednisone 40 mg daily x 2 days, then 63m daily x 2 days, then 161mdaily x 2 days, then 79m67maily baseline - Take doxycycline 100m30m twice daily x 5 days; take after meals and avoid sunlight      Follow-up - 6 -8 week followup Dr RamaChase Caller30 min visit - Symptoms socre and walk test on 2L [place] at followu  ( Level 05 visit: Estb 40-54 min visit type: on-site physical face to visit  in total care time and counseling or/and coordination of care by this undersigned MD - Dr MuraBrand Malesis includes one or more of the following on this same day 09/24/2021: pre-charting, chart review, note writing, documentation discussion of test results, diagnostic or treatment recommendations, prognosis, risks and benefits of management options, instructions, education, compliance or risk-factor reduction. It excludes time spent by the [facility] staff in the care of the patient. Actual time  40 m55)   SIGNATURE    Dr. MuraBrand MalesD., F.C.C.P,  Pulmonary and Critical  Care Medicine Staff Physician, [place] Director - Interstitial Lung Disease  Program  [facility] [place] [facility] [place], [place], [address]  Pager: [phone], If no answer or between  15:00h - 7:00h: call [phone] Telephone: [phone]  12:16 PM 09/24/2021

## 2021-09-24 NOTE — Progress Notes (Signed)
#IPF - uip path 03/22/13  - On OFEV since early May 2015   PFT FVC fev1 ratio BD fev1 TLC DLCO Walk test 19f x 3 laps wt rx             Spring 2014 2.9:L L/% /%        June 2015 2.7L/67% 2.34L/78% 86  3.68/57% 20.24/71%   ofev start  [name]  2016 2.5L/55% 2.1L/60% 84/108%    No desat, Pk HR 130    02/11/2015 Screening visit afferent cough study 2.59L/56% 2.24L/63% 87/112%    Dx with afluttter on ekg   Screen failed due to hx of stones  03/26/2015        No desat, PK HR 130    08/26/15 pft machine 2.73L/68% 2.38L/80% 87   [name]     06/29/2016 Office spiro 2.49L/56% 2.14L/65%     Pk HR 90 and lowest pulse ox 99% ->93%  ofev  11/02/2016  office full pft  2.61L/66% 2.3L/79%    17.23/60%   pfev  09/16/2017  2.53L/64%      Pulse ox 99% -> 93%, HR 81- > 84  ofev  12/22/2017        100% -> 97%,  HR 66 -> 96    09/19/2018        100% -> 93, HR 74 -> 85         OV 03/26/2015  Chief Complaint  Patient presents with   Follow-up    [name], dry cough. CATH procedure on 04/08/15. Denies any chest tightnes/congestion.    75year old male with idiopathic pulmonary fibrosis. He is on Ofev after having failed Esbriet in the past due to side effects. He is tolerating Ofev well. Liver function test in March 2016 was normal. I last saw him in January 2016. Subsequently in February 2016 we screened him for a cough idiopathic pulmonary fibrosis study called afferent but he screen failed due to history of renal stones. At this time he was incidentally diagnosed with new onset atrial flutter. He has seen Dr. GCrissie Sicklesand has been started on anticoagulation with Xarelto and metoprolol. He is due to undergo a ablation. He is not aware of the increased bleeding risk with Ofev in the setting of anticoagulation. He assures me that the anti-coag and is only until he undergoes ablation and after that the plan is to stop it. Therefore only short-term anticoagulation with Xarelto.  Overall his effort tolerance is fine. He has postponed his [facility] lung transplant until he completes his ablation.  Walking desaturation test in the office today he did not desaturate. This is stable. Spirometry not done   Past medical history: Atrial flutter new diagnoses as above   OV 06/29/2016  Chief Complaint  Patient presents with   Follow-up    pt states he is at baseline: sob with exertion, nonprod cough.      75year old male with idiopathic pulmonary fibrosis. He is on Ofev . He completed 6 months of PRAISE study (([id number] placebo) in l;ate winter/early spring 2017. This is SPalestinevisit. Overall stable. No worsening dyspnea and cough. Continues daily exercise is walking many miles. Some days he is sitting more than the others. He is interested in more trials and results of the praise study. These results are not out yet. He is compliant with his Ofe last liver function test was in February 2017. There are no new issues. He believes his atrial fibrillation  is in sinus rhythm;    OV  11/02/2016  Chief Complaint  Patient presents with   Follow-up    4 month IPF follow up and B&A review - does report increased prod cough with yellow mucus, wheezing, tightness in chest, increased SOB, x3-4 weeks with the weather change.  denies any f/c/s, hemoptysis, chest pain   IPF - on ofev. Last liver function test was in July 2017 and normal. Overall dyspnea stable. However he is been having diarrhea with ofev. He called in and we reduced it to 1 tablet a day and this improved the diarrhea. He back on 1 tablet twice a day. The diarrhea has returned although it is much milder. He has never taken any medication for this. Are function test shows slight improvement from July 2017 and similar levels to approximately one year ago  The new issue that he's having significant recurrent sinus infection in the back of chronic postnasal drainage. This since the fall 2017. He says that he and his wife catch  respirator infection and passive back and forth to each other. He is having cough, chest tightness, postnasal drip and yellow sputum. It is not affecting any dyspnea. There is no wheeze. There is no fever or hemoptysis.  OV 03/02/2017  Chief Complaint  Patient presents with   Follow-up    Pt states his breathing is at baseline. Pt c/o dry cough - pt states this is baseline. Pt denies CP/tightness.     Idiopathic pulmonary fibrosis on Ofev. Last seen November 2017. He is a routine follow-up.  He is here with his wife. His sinus infections a result. He is interested in research protocols but we have none right now. He is tolerating his Ofev associated with some mild diarrhea occasional which she takes medication. He is not much of a problem. He tells me that she walks 6 times a week for 5 miles a day. On the treadmill at a 5 incline walking at 3-1/2 miles an hour. For this he feels a little more dyspneic than before. Walking desaturation test 185 feet 3 laps on room air: His pulse ox dropped from 98% at rest and 93% with exertion. His heart rate jumped from 66 at rest and 97 at peak exertion. Features are suggestive of mild progression of the disease. He is not attending pulmonary fibrosis foundation because he felt this was negative but that was years ago. We discussed this and he might be interested again.     OV 09/16/2017  Chief Complaint  Patient presents with   Follow-up    PFT done today. Pt states that his breathing is gradually getting worse. SOB which depends if it is on exertion vs all the time and c/o prod cough with yellow mucus. Denies any CP   S IPF on fev followup. Overall doing well. He recent bronchitis and liked anabiotic and prednisone. He felt the prednisone helped his cough just currently rated as mild to moderate in severity. Helped the shortness of breath. He also helped arthralgia. He is now wondering what taking chronic prednisone. We had an extensive discussion about  this. Spirometry shows that the FVC stable compared to earlier this year but slightly progressive compared to one year ago. [facility] interstitial lung disease questionnaire shows symptoms. His wife is here with him. He is interested in research trials. He has participated in distress before. He is asking about opioid refill for cough if I wont do prednisone.   OV 12/22/2017  Chief Complaint  Patient presents with   Follow-up    Pt states he believes his breathing is at a stable point right now. States that he is coughing which is worse when he first wakes up and then also at night; occ will cough up yellow phlegm.     C.o ofev intolerance with fatigue, weight loss, diarrhea. Does not like lomotil. Wnaant to give it a holiday foir 2-3 weeks. INterested in research protocol - discussed [place]. 5 year cut off since diagnosis coming up 03/22/18. He is overall frustrated ith qualtiy of life. Walks 5 miles; starts feeling dizzy > 3 miles in but not checking pulse ox despite advice. Walking desaturation test on 12/22/2017 185 feet x 3 laps on ROOM AIR:  did noit desaturate. Rest pulse ox was 100%, final pulse ox was 97%. HR response 66/min at rest to 96/min at peak exertion. Patient Vashaun Belongia  Did not Desaturate < 88% . Vernie Hoh yes  Desaturated </= 3% points. Dohn Belair yes did get tachyardic     Walking desaturation test on 02/01/2018 185 feet x 3 laps on ROOM AIR:  did not desaturate. Rest pulse ox was 100%, final pulse ox was 91%. HR response 67/min at rest to 85/min at peak exertion. Patient Franck Dutko  dod not Desaturate < 88% . Bayan Ivens yes diod  Desaturated </= 3% points. Candy Chhim did not get tachyardic   OV 02/01/2018  Chief Complaint  Patient presents with   Follow-up    Pt states he has been doing good since last visit. Started back on OFEV 12/24/17 after taking a holiday off of it.     FU IPF  Took ofev holiday. And then restarted 01/24/18 and doing well. [place]in  ofev ok. KBILD ok.   OV 03/16/2018'  . Chief Complaint  Patient presents with   Follow-up    PFT done today. States he has been doing well and denies any current complaints. Tolerating OFEV well and has been walking about 5 miles every day with no complaints.   Ms. Gomillion returns for follow-up of idiopathic pulmonary fibrosis.  He is now back on nintedanib after giving it a holiday for a few weeks.  He is tolerating it well and no problems.  He goes for daily walks and has no problems.  His lung function was reviewed today and it shows for the first time a significant decline of greater than 200 cc between 2 time points.  This correlates with him not taking over for a few weeks nevertheless overall he is happy with his health.  He is not interested in research trials anymore.  He has a new question about travel advice encounter.  He plans an [place] from [place], [place] intra[place]junal in [place] for 10 days.  This will start on May 22, 2018.  He wants to make sure it is safe for him to go.  He said he has never been on a cruise and he and his wife have a strong desire to go as part of his goals of care.   OV 05/16/2018  Chief Complaint  Patient presents with   Follow-up    Breathing is unchanged since last OV.    Tanner Bishop , 75 y.o. , with dob Dec 13, 1946 and male ,Not Hispanic or Latino from [address] [place] [id number] - presents to pulm ILD clinic for IPF.  He presents with his wife.  And just under 7 days he is going to embark on a  [place].  This visit is precautionary visit just before that.  Most recent visit was in April 2019.  At this point in time in terms of his IPF he feels stable.  He is compliant with full dose of 5 and is having only mild diarrhea.  Other than that he is tolerating it just fine.  He is looking forward to his cruise.  He had pulmonary function test today that shows an improvement compared to last visit and it is very  similar to summer/fall 2018 in terms of FVC and DLCO.  Although he does feel like he is coming down with an acute bronchitis and wants to take some antibiotic and prednisone I had of the trip.  We also discussed about him having prophylactic antibiotics handy.  He wants a refill on his Tussionex for cough.  At this point in time he is past 5 years of diagnosis and not eligible for research trials he is not interested in transplant although the recent pulmonary fibrosis foundation meeting he did hear from a lot of transplant patients about the individual personal experience.       OV 09/19/2018  Subjective:  Patient ID: Tanner Bishop, male , DOB: [birth year] , age 31 y.o. , MRN: [id number] , ADDRESS: [place] [id number]   09/19/2018 -   Chief Complaint  Patient presents with   Follow-up    Pt saw cards 08/23/18. States he is still having problems with his heart going in and out of rhythm and states he has had some episodes to where he almost passes out. States his breathing is at a stable point. States he also has  an occ cough.     HPI Cornie Ranum 75 y.o. -presents 5-year follow-up.  I personally saw him in June 2019.  Then approximately a month ago he presented and saw acutely my nurse practitioner because of dizziness.  It was felt to be A. fib RVR.  He subsequently saw Dr. Crissie Sickles his electrophysiologist.  I reviewed the note.  Flecainide was started.  He says despite that l approximately 10 weeks ago he had another episode of dizziness while getting out of the shower.  He was tachycardic with a heart rate of 140s.  He felt he might pass out but there is no focal neurologic deficits or abnormal sensation.  His pulse ox was normal at 97% at that time.  It happened at rest.  He says he has been walking on the treadmill for several miles and he does not desaturate.  He says his pulse ox is 93% at that time.  He never drops into the 80s.  He does not think his dizziness and  palpitation issues are related to his pulmonary fibrosis.  However [name] is wondering about oxygen drops during this time.  He is not tolerating his nintedanib at full dose without any problems.  He has had a successful [place] trip.  He is participating in the patient support group.  He is up-to-date with his flu shot      OV 10/13/2018  Subjective:  Patient ID: Tanner Bishop, male , DOB: 04-Oct-1946 , age 64 y.o. , MRN: [id number] , ADDRESS: [place] [id number]   10/13/2018 -   Chief Complaint  Patient presents with   Follow-up    review PFT.  c/o baseline sob with exertion.       HPI Linwood Bargo 75 y.o. -presents for follow-up of IPF to the ILD clinic.  This visit is arranged because letter physiologist Dr. Taylor was concerned about hypoxemia effects on his atrial fibrillation.  At this point in time patient tells me that after increasing dose of flecainide his atrial fibrillation and palpitations have improved and resolved.  Overall he feels stable.  On September 26, 2018 he had 6-minute walk test and this was pretty robust with walking 384 meters and with lowest pulse ox of 95%.  Then on October 05, 2018 he had overnight pulse oximetry that shows 12 minutes of desaturation less than 88%.  He is not interested in oxygen.  Then he called his October 07, 2018 with worsening bronchitis episodes and we gave him 5 days of prednisone and antibiotics.  He finished his last dose yesterday.  Overall he feels stable but pulmonary function test today shows decline in both FVC and DLCO.  And then when I questioned him he felt like he still had some residual bronchiti and feels another course of prednisone could help him.  There are no other new issues.  Last liver function test October 2019 and was normal.      OV 01/12/2019  Subjective:  Patient ID: Isam Niese, male , DOB: 06/08/1946 , age 72 y.o. , MRN: [id number] , ADDRESS: [address] [place]  [id number]   01/12/2019 -   Chief Complaint  Patient presents with   Follow-up    PFT performed today. Pt states he is about the same since last visit.States he still becomes SOB with exertion, has a lot of coughing but has been taking mucinex, and also has had some CP or chest tightness.     HPI Jahvier Biswell 75 y.o. -returns for follow-up of his IPF.  He is now more than 5 years since diagnosis.,  April 2020 he will be 6 years since diagnosis.  He continues to exercise well on the treadmill and according to his wife he is does well.  He is not dropping oxygen at this time.  He does not have a cough when needed works on the tile.  However at other times the day especially when he is trying to lie down to bed or when he gets up in the morning and goes to the bathroom he has really bad cough.  Overall the cough severity is now 6 out of 10 in terms of how it is impacting his quality of life.  He tells me that every time he takes prednisone for the cough he feels better but then when he comes off prednisone cough gets worse.  He said multiple prednisone courses of brief duration for this chronic cough.  In addition for the last few weeks has had yellow sinus drainage and sinus headache and sinus congestion and he feels this is again making his chronic cough worse.  He is open to taking antibiotic and prednisone course.  In terms of taking nintedanib he has no side effects.  His last liver function test reviewed was in October 2019 and it was normal at that time.  Overall at this point in time he really wants good significant support for his cough in terms of affecting his quality of life.    ROS - per HPI     OV 02/16/2019  Subjective:  Patient ID: Agostino Orvis, male , DOB: 06/22/1946 , age 72 y.o. , MRN: [id number] , ADDRESS: [address] [place] [address]   02/16/2019 -   Chief Complaint  Patient presents with   Follow-up    HRCT   and sinus CT performed 2/18. Pt states he is about the same  as last visit and states SOB is about the same. Pt still has a dry cough. Denies any complaints of CP/chest tightness.     HPI Kable Daniel 75 y.o. -returns for follow-up of his IPF.  He presents with his wife.  He is here to review the test results.  He had high-resolution CT chest and CT sinus.  These were done in order to reevaluate his disease because he is having significant cough.  His high-resolution CT chest shows only mild progression in his IPF in 3 years.  However there is significant new findings of tracheobronchomalacia although there is no lung cancer.  He also has two-vessel coronary artery calcification.  These are new findings.  Since last visit and a sinus episode he is stable.  He is currently run out of his nasal steroids.  He says the pharmacy said that I declined his prescription request.  I do not recollect such a conversation.  In any event he is now going to participate in the cough study called scenic.  He has received a copy of the consent form.  He is rated.  He has an appointment for his consent visit.  It is possible that the nasal steroid is on the exclusion list but I do not have the exclusion list with me.  Anyway he is not consented.  He is not having chest pain when he does his treadmill exercises.  He works out hard.  He states he gets tachycardic.  He says he has a 6 times that he will die from his heart condition before his pulmonary fibrosis.  He has upcoming appointment with his cardiologist [name] for atrial fibrillation.  ...................................................................... OV 06/08/2019   Subjective:  Patient ID: Tanner Bishop, male , DOB: [birth year] , age 50 y.o. , MRN: [id number] , ADDRESS: [place] [id number]   06/08/2019 -   Chief Complaint  Patient presents with   Follow-up    1wk f/u for IPF. Patient stated that he was given a round of prednisone last week for some SOB but is feeling much better.      HPI Estell  Kamaka 75 y.o. -returns for IPF follow-up.  Last seen in March 2020.  Since the pandemic started he has been social distancing.  He has not attended any support group.  He has been tolerating his Ofev quite well without any difficulty.  Recently had a bronchitis exacerbation and we gave prednisone.  This resolved his cough.  He was going to participate in a cough study but because of the pandemic the study got canceled.  He tells me that every time he takes prednisone the cough goes away.  He is very interested in taking daily prednisone for symptom relief of cough.  We discussed the side effects of prednisone that include weight gain, easy bruising, adrenal insufficiency, osteoporosis, cataracts hypertension, diabetes, immunosuppression.  Despite all this he wants to take a low-dose prednisone daily.  His last liver function test was in March 2020.  This needs to be retested because he is on nintedanib.  His symptom scores at walk test show relative stability.  His last pulmonary function test was in January 2020 but because of the pandemic is on hold.  We are using symptom and walk test to determine progression.         OV 04/11/2020  Subjective:  Patient ID: Tanner Bishop, male , DOB: 01/23/1946 ,  age 55 y.o. , MRN: [id number] , ADDRESS: [address] [place] [address]   04/11/2020 -   Chief Complaint  Patient presents with   Follow-up    PFT performed today. Pt states he feels like his breathing is gradually becoming worse and states it could be at any time that he is SOB.   #IPF - uip path 03/22/13  - On OFEV since early May 2015 -On chronic daily prednisone because of cough since 2020  HPI Irven Chawla 75 y.o. -presents with his wife IPF follow-up.  Last saw him June 2020.  After that he feels his dyspnea is worse.  He says that he could do several miles in one 1 hour and 30 minutes on the treadmill.  The same distance and now taking 1 hour and 40 minutes.  He feels his disease is  getting worse.  He says the prednisone is working well for him because of improved cough and wellbeing.  He is having some skin bruising because of that.  The nintedanib he is tolerating well except for some mild diarrhea.  On objective symptom score is actually similar to 1 year ago but may be slightly worse than 6 months ago or 9 months ago.  On pulmonary function testing he is stable compared to 1 year ago but slow and steadily he is definitely worse over the many years.  We noticed on his walking desaturation test that he did not mount a tachycardic response as he is done in the past.  He is on flecainide for atrial fibrillation.  He takes his 3 times daily.  He feels his atrial fibrillation is under good control.  There is no chest pain.  There is no weight loss.  Is no loss of physical conditioning.  He is interested in clinical trials.   A month ago he had nausea and his amylase/lipase was elevated.  He says he is fine now.  Primary care address this. ROS - per HPI  IMPRESSION: 1. 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 Findings are consistent with UIP per consensus guidelines: Diagnosis of Idiopathic Pulmonary Fibrosis: An Official ATS/ERS/JRS/ALAT Clinical Practice Guideline. [place], Iss 5, [phone], Aug 14 2017. 2. 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 3. Two-vessel coronary atherosclerosis.   Aortic Atherosclerosis (ICD10-I70.0).     Electronically Signed   By: Ilona Sorrel M.D.   On: 01/31/2019 12:24  OV 06/11/2020  Subjective:  Patient ID: Tanner Bishop, male , DOB: [birth year] , age 62 y.o. , MRN: [id number] , ADDRESS: [place] [id number]   06/11/2020 -   Chief Complaint  Patient presents with   Follow-up    PFT performed today.  Pt states he has been doing good since last visit. States  breathing is about the same.,    #IPF - uip path 03/22/13  - On OFEV since early May 2015 -On chronic daily prednisone because of cough since 2020  HPI Devron Kading 75 y.o. -returns to clinic for ILD follow-up.  In the interim he feels that his wife has worsening dementia.  She is able to self-care but only does minimal cooking.  She is more irritable.  His daughter Theadora Rama is now on disability because of back issues.  Patient has a son in [place] who he is close with.  Together they take care of his wife.  He says  he has become the primary caregiver now for his wife.  Nevertheless she is functional and does some ADLs.  He continues with nintedanib 150 mg twice daily.  Overall tolerance is good just mild diarrhea.  This is baseline.  Shortness of breath is baseline symptom score is 14 and similar to the past.  Pulmonary function test appears stable.  Walking desaturation test is stable.  His last liver function test was 2 months ago.  His main issue is that he is having some financial issues covering the cost of nintedanib.  He $6000 grant is running out.  He only has 1 month supply left.  He wants to meet with the pharmacist.      OV 11/12/2020   Subjective:  Patient ID: Tanner Bishop, male , DOB: [birth year], age 4 y.o. years. , MRN: [id number],  ADDRESS: [place] [id number] PCP  [name], MD Providers : Treatment Team:  Attending Provider: Brand Males, MD Patient Care Team: [name], MD as PCP - General Brand Males, MD as Consulting Physician (Pulmonary Disease) [place], Anne Ng, [facility] as Pharmacist (Pharmacist)    Chief Complaint  Patient presents with   Follow-up    IPF, still getting SOB on DOE with some chest tightness       HPI Hershy Garinger 75 y.o. -IPF follow-up.  Presents with his wife who I am seeing after a long time.  According to the patient patient himself is stable from IPF standpoint.  However he says for the last  several months he has had an exacerbation in his chronic back pain.  He has significant pain.  He has had steroid injections.  The first 1 was successful but the second 1 and subsequent failed.  He is not able to do his walks as he used to in the past.  He has gained some weight.  He is frustrated by this.  He is asking if he can go see a Restaurant manager, fast food.  The other issue with him was elevated amylase.  We never found a cause for this.  So his primary care physician worked him up he had abdominal CT imaging on 08/16/2020 and this was normal.  Otherwise he is okay  He is asking for a refill on his prednisone that he takes for cough.  He also wants Tussionex as needed.  He takes that for cough.  He also says that he got summoned for jury duty.  He is wondering what to do.  I told him under no circumstances he should ever do jury duty.  I worry about the risk   ROS - per HPI  OV 01/24/2021  Subjective:  Patient ID: Tanner Bishop, male , DOB: [birth year] , age 8 y.o. , MRN: [id number] , ADDRESS: [address] [place] [address] PCP [name], MD Patient Care Team: [name], MD as PCP - General Brand Males, MD as Consulting Physician (Pulmonary Disease) [place], Anne Ng, [facility] as Pharmacist (Pharmacist)  This Provider for this visit: Treatment Team:  Attending Provider: Brand Males, MD    01/24/2021 -   Chief Complaint  Patient presents with   Acute Visit    Increased SOB since last Friday, heart still feels "funny"   Follow-up idiopathic pulmonary fibrosis On nintedanib and low-dose prednisone for cough Idiopathic elevation in amylase unrelated to nintedanib.  Normal abdominal imaging.  HPI Lowery Joss 75 y.o. -returns for an acute visit.  On Friday, 01/22/2021 he says he got up and he felt  like baseline and then when he walked he suddenly noticed that he was suddenly way more short of breath than usual.  His heart started feeling funny.  He took an acute visit with  cardiology.  I reviewed the notes.  I do not see an EKG but he tells me an EKG was done and he was in sinus rhythm.  He had normal troponin, normal BNP and normal D-dimer.  Chest x-ray was baseline.  I personally visualized this.  Therefore he was sent to pulmonary.  We are seeing him as an acute visit today.  His symptom scores are definitely worse than baseline.  He is tolerating his nintedanib and his weight is stable.  His walking desaturation test shows a sudden decline.  For the first time his pulse ox is below 88%.  His pace of walking was much slower.  He tells me he still feels funny in his chest.  There are no Covid symptoms.  He is fully vaccinated.  There is no edema orthopnea proximal nocturnal dyspnea coughing or wheezing.  There is no sputum or fever production.  His wife is here with him..        CT Chest data  DG Chest 2 View  Result Date: 01/23/2021 CLINICAL DATA:  Shortness of breath cardiac palpitations EXAM: CHEST - 2 VIEW COMPARISON:  12/01/2016 FINDINGS: Cardiac shadow is at the upper limits of normal in size but stable. Lungs are well aerated bilaterally with mild fibrotic scarring stable in appearance from the prior exam. No new focal infiltrate or sizable effusion is seen. No acute bony abnormality is noted. IMPRESSION: Chronic scarring without acute abnormality. Electronically Signed   By: Inez Catalina M.D.   On: 01/23/2021 08:24   CT Chest High Resolution  Result Date: 01/24/2021 CLINICAL DATA:  IPF EXAM: CT CHEST WITHOUT CONTRAST TECHNIQUE: Multidetector CT imaging of the chest was performed following the standard protocol without intravenous contrast. High resolution imaging of the lungs, as well as inspiratory and expiratory imaging, was performed. COMPARISON:  01/31/2019, 03/11/2016, 07/24/2015, 02/14/2013 FINDINGS: Cardiovascular: Aortic atherosclerosis. Cardiomegaly. Scattered left coronary artery calcifications. Enlargement of the main pulmonary artery measuring  up to 3.4 cm in caliber. No pericardial effusion. Mediastinum/Nodes: No enlarged mediastinal, hilar, or axillary lymph nodes. Thyroid gland, trachea, and esophagus demonstrate no significant findings. Lungs/Pleura: Redemonstrated moderate pulmonary fibrosis in a pattern with apical to basal gradient, featuring traction bronchiectasis, irregular peripheral interstitial opacity, septal thickening, subpleural bronchiolectasis, and areas of honeycombing at the lung bases. Fibrotic findings are minimally worsened compared to prior examination dated 01/31/2019 and further slightly worsened over a longer period of follow-up dating back to 02/14/2013. No significant air trapping on expiratory phase imaging. Evidence of prior left lung wedge biopsy. No pleural effusion or pneumothorax. Upper Abdomen: No acute abnormality. Musculoskeletal: No chest wall mass or suspicious bone lesions identified. IMPRESSION: 1. Redemonstrated moderate pulmonary fibrosis in a pattern with apical to basal gradient, featuring traction bronchiectasis, irregular peripheral interstitial opacity, septal thickening, subpleural bronchiolectasis, and areas of honeycombing at the lung bases. Fibrotic findings are minimally worsened compared to prior examination dated 01/31/2019 and further slightly worsened over a longer period of follow-up dating back to 02/14/2013. Findings are in a UIP pattern and in keeping with reported diagnosis of IPF. 2. Cardiomegaly and coronary artery disease. 3. Enlargement of the main pulmonary artery, as can be seen in pulmonary hypertension. Aortic Atherosclerosis (ICD10-I70.0). Electronically Signed   By: Eddie Candle M.D.   On: 01/24/2021 09:16  OV 03/11/2021  Subjective:  Patient ID: Tanner Bishop, male , DOB: [birth year] , age 43 y.o. , MRN: [id number] , ADDRESS: [address] [place] [address] PCP [name], MD Patient Care Team: [name], MD as PCP - General Brand Males, MD  as Consulting Physician (Pulmonary Disease) Viona Gilmore, [facility] as Pharmacist (Pharmacist)  This Provider for this visit: Treatment Team:  Attending Provider: Brand Males, MD    03/11/2021 -   Chief Complaint  Patient presents with   Follow-up    Follow up after PFT.  Pt stated that he has the oxygen but these are the larger tanks.  He is waiting to get the POC from [facility].    Follow-up idiopathic pulmonary fibrosis  - On nintedanib and low-dose prednisone for cough  -Start portable oxygen for progressive disease March 2022  Idiopathic elevation in amylase unrelated to nintedanib.  Normal abdominal imaging.   Normal right heart catheterization 02/21/2021 by Dr. Glori Bickers.    - Mean pressure 17.  Wedge pressure 6.  Cardiac index 2.6.  PVR 2.1  HPI Norman Beedle 75 y.o. -presents for follow-up since his last visit when we noticed exertional hypoxemia.  Got echocardiogram and then sent him to cardiology.  He had right heart catheterization.  The result is reviewed and is normal.  He is tolerating his nintedanib fine.  He has had problems qualifying for oxygen even though he desaturated.  He says something about insurance did not approve my order.  He is also frustrated with [facility].  He wants to switch to [place].  He is interested in clinical trial but current phase 3 trials are on hold because of logistic issues with sponsor [employer] study. Other studies might be available      OV 06/13/2021  Subjective:  Patient ID: Tanner Bishop, male , DOB: 1946/03/17 , age 4 y.o. , MRN: [id number] , ADDRESS: [address] [place] [address] PCP [name], MD Patient Care Team: [name], MD as PCP - General Lovena Le Champ Mungo, MD as PCP - Cardiology (Cardiology) Brand Males, MD as Consulting Physician (Pulmonary Disease) Viona Gilmore, [facility] as Pharmacist (Pharmacist)  This Provider for this visit: Treatment Team:  Attending Provider: Brand Males, MD    06/13/2021 -   Chief Complaint  Patient presents with   Follow-up    PFT  performed 6/30 and cxr performed today. Pt states he feels like he is worse since last visit and states he is also coughing more too. Pt did have Covid and since then he has been worse.      HPI Sampson Trela 75 y.o. -presents for follow-up.  This is unscheduled visit.  He tells me that he is scheduled himself for low back surgery because of worsening sciatica with Dr. Kathyrn Sheriff.  This visit was to be done in the outpatient suite.  When he showed up preoperatively the anesthesiologist rejected surgical fitness because of crackles and because anesthesiologist felt there was pedal edema.  Patient himself denies any pedal edema.  [In fact on exam he does not have pedal edema].  He says he was frustrated with experience because cardiology had cleared him for the procedure.  He also reports that crackles were felt to be due to heart failure although he has pulmonary fibrosis.  In talking to him he did have COVID at the end of May 2022.  After that he is slightly worse.  His FVC shows slight decline but  his DLCO is stable.  Try to do a walk test on him but because of his sciatica he could not walk.  He says he uses oxygen with exercise and mowing yard.  He is really bothered by his pain.  He was wondering about nonsurgical maneuvers to help his pain.  We talked about Tylenol but within limits.  We talked about chiropractor but he says it did not help him.  We talked about dry needling.  He took a recommendation from me or someone that I personally use for dry needling.  However he thinks he will just proceed with surgery.  I discussed Dr. Kathyrn Sheriff and Dr. Kathyrn Sheriff we will plan the surgery in the hospital.  He wants refill of his cough medication.  His subjective symptom score is stable.  His chest x-ray looks stable.  He will have lab blood work today.        OV 08/14/2021  Subjective:  Patient ID: Tanner Bishop,  male , DOB: 12-Jul-1946 , age 73 y.o. , MRN: [id number] , ADDRESS: [place] [address] PCP [name], MD Patient Care Team: [name], MD as PCP - General Lovena Le Champ Mungo, MD as PCP - Cardiology (Cardiology) Brand Males, MD as Consulting Physician (Pulmonary Disease) Viona Gilmore, [facility] as Pharmacist (Pharmacist)  This Provider for this visit: Treatment Team:  Attending Provider: Brand Males, MD  Follow-up idiopathic pulmonary fibrosis  - On nintedanib and low-dose prednisone for cough  -Start portable oxygen for progressive disease March 2022  Idiopathic elevation in amylase unrelated to nintedanib.  Normal abdominal imaging.   Normal right heart catheterization 02/21/2021 by Dr. Glori Bickers.    - Mean pressure 17.  Wedge pressure 6.  Cardiac index 2.6.  PVR 2.1  08/14/2021 -   Chief Complaint  Patient presents with   Follow-up    Pt states that his breathing has become worse since last visit.      HPI Omauri Milroy 75 y.o. -returns for follow-up.  Approximately 2 weeks ago on 07/30/2021 he had a spinal surgery.  Surgery went well.  After that the back pain is improved and he sustained improvement.  His effort tolerance then improved but in the last 1 week he is progressively more short of breath.  He says he uses 2 L of nasal cannula oxygen at rest and 3 L with exertion but he states he is unable to do the things he used to even a week ago.  There is no associated worsening edema.  No orthopnea no paroxysmal nocturnal dyspnea.  No chest pain.  No hemoptysis no fever no chills no worsening cough.  He is taking his prednisone and nintedanib correctly.  He says the shortness of breath is so worse that he worries that he is going to die pretty soon.  His weight is stable.  He is unclear if he is desaturating but he did notice that he is having fluctuating heart rate.  Intermittently have significant bradycardia to 40.  His ILD symptom score  shows significant worsening.    CT     OV 09/11/2021  Subjective:  Patient ID: Tanner Bishop, male , DOB: [birth year] , age 75 y.o. , MRN: [id number] , ADDRESS: [address] [place] [address] PCP [name], MD Patient Care Team: [name], MD as PCP - General Lovena Le Champ Mungo, MD as PCP - Cardiology (Cardiology) Brand Males, MD as Consulting Physician (Pulmonary Disease) Viona Gilmore, [facility] as Pharmacist (  Pharmacist)  This Provider for this visit: Treatment Team:  Attending Provider: Brand Males, MD    09/11/2021 -   Chief Complaint  Patient presents with   Follow-up    Pt states his breathing is about the same since last visit.   Follow-up idiopathic pulmonary fibrosis  - On nintedanib and low-dose prednisone for cough  -Start portable oxygen for progressive disease March 2022 - >  hypoxemic at rest sept 2022  Idiopathic elevation in amylase unrelated to nintedanib.  Normal abdominal imaging.   Normal right heart catheterization 02/21/2021 by Dr. Glori Bickers.    - Mean pressure 17.  Wedge pressure 6.  Cardiac index 2.6.  PVR 2.1  Covid may 2022 paxlovid  Spin Surgery - aug 2022  HPI Rober Skeels 75 y.o. -acute visit.  After his last visit he did see cardiology.  Its he appears that his atrial fibrillation is okay.  He then called the support leader Marlane Mingle for the pulmonary fibrosis support group and was complaining about his worsening shortness of breath.  Mr. Hildred Alamin called me and I was asked to see the patient in short order.  But the patient tells me that compared to the last month he is the same.  Nevertheless significant worsening in dyspnea.  In July 2022 before the spine surgery he could walk a mile or more.  Now even with 2 L nasal cannula walking to the mailbox and back is making him very dyspneic.  He is not able to do a lot of activities of daily living easily.  It used to taking 5 minutes to put his clothes on and finish  her shower.  Now is taking 20 minutes.  He seems of lost some weight.  He continues on his nintedanib.  His echocardiogram was unrevealing.  He has had prednisone but this is not helping.        Narrative & Impression  CLINICAL DATA:  75 year old male with history of interstitial lung disease. Follow-up examination.   EXAM: CT CHEST WITHOUT CONTRAST   TECHNIQUE: Multidetector CT imaging of the chest was performed following the standard protocol without intravenous contrast. High resolution imaging of the lungs, as well as inspiratory and expiratory imaging, was performed.   COMPARISON:  Chest CT 01/24/2021.   FINDINGS: Cardiovascular: Heart size is mildly enlarged. There is no significant pericardial fluid, thickening or pericardial calcification. There is aortic atherosclerosis, as well as atherosclerosis of the great vessels of the mediastinum and the coronary arteries, including calcified atherosclerotic plaque in the left main, left anterior descending and left circumflex coronary arteries.   Mediastinum/Nodes: No pathologically enlarged mediastinal or hilar lymph nodes. Please note that accurate exclusion of hilar adenopathy is limited on noncontrast CT scans. Esophagus is unremarkable in appearance. No axillary lymphadenopathy.   Lungs/Pleura: High-resolution images again demonstrate some areas of ground-glass attenuation, septal thickening, thickening of the peribronchovascular interstitium, cylindrical bronchiectasis, peripheral bronchiolectasis and some mild honeycombing. Findings have a definitive craniocaudal gradient and appear mildly progressive compared to the prior examination. Inspiratory and expiratory imaging is unremarkable. No acute consolidative airspace disease. No pleural effusions. No definite suspicious appearing pulmonary nodules or masses are confidently identified upon this background of interstitial lung disease. Some tiny  calcified granulomas are noted in the left lower lobe.   Upper Abdomen: Aortic atherosclerosis.   Musculoskeletal: There are no aggressive appearing lytic or blastic lesions noted in the visualized portions of the skeleton.   IMPRESSION: 1. The appearance of the lungs is compatible with  interstitial lung disease, with a spectrum of findings considered diagnostic of usual interstitial pneumonia (UIP) per current ATS guidelines, as detailed above. Minimal progression of disease compared to the prior study. 2. Aortic atherosclerosis, in addition to left main and 2 vessel coronary artery disease. Please note that although the presence of coronary artery calcium documents the presence of coronary artery disease, the severity of this disease and any potential stenosis cannot be assessed on this non-gated CT examination. Assessment for potential risk factor modification, dietary therapy or pharmacologic therapy may be warranted, if clinically indicated. 3. Mild cardiomegaly.     Electronically Signed   By: Vinnie Langton M.D.   On: 08/20/2021 13:48       IMPRESSIONS     1. Left ventricular ejection fraction by 3D volume is 59 %. The left  ventricle has normal function. The left ventricle has no regional wall  motion abnormalities. There is mild left ventricular hypertrophy. Left  ventricular diastolic pa strain is 99.2 %.   2. Right ventricular systolic function is mildly reduced. The right  ventricular size is mildly enlarged. rameters are  consistent with Grade I diastolic dysfunction (impaired relaxation). The  average left ventricular global longitudinalThere is mildly elevated pulmonary  artery systolic pressure.   3. Left atrial size was moderately dilated.   4. The mitral valve is degenerative. Mild to moderate mitral valve  regurgitation. No evidence of mitral stenosis.   5. The aortic valve is normal in structure. Aortic valve regurgitation is  not visualized. No  aortic stenosis is present.   6. The inferior vena cava is normal in size with greater than 50%  respiratory variability, suggesting right atrial pressure of 3 mmHg.   Comparison(s): No significant change from prior study. Prior images  reviewed side by side.     OV 09/24/2021  Subjective:  Patient ID: Tanner Bishop, male , DOB: 1946-04-24 , age 40 y.o. , MRN: [id number] , ADDRESS: [place] [address] PCP [name], MD Patient Care Team: [name], MD as PCP - General Lovena Le Champ Mungo, MD as PCP - Cardiology (Cardiology) Brand Males, MD as Consulting Physician (Pulmonary Disease) Viona Gilmore, [facility] as Pharmacist (Pharmacist)  This Provider for this visit: Treatment Team:  Attending Provider: Brand Males, MD    09/24/2021 -   Chief Complaint  Patient presents with   Follow-up    PFT performed today.  Pt states he has been doing okay since last visit. States he may be some better.    Follow-up idiopathic pulmonary fibrosis  - On nintedanib and low-dose prednisone for cough  -Start portable oxygen for progressive disease March 2022 - >  hypoxemic at rest sept 2022  Idiopathic elevation in amylase unrelated to nintedanib.  Normal abdominal imaging.   Normal right heart catheterization 02/21/2021 by Dr. Glori Bickers.    - Mean pressure 17.  Wedge pressure 6.  Cardiac index 2.6.  PVR 2.1  Covid may 2022 paxlovid  HPI Quantay Wagenaar 75 y.o. - Seen for follow-up.  This visit is to see his pulmonary function test.  Shows dramatic decline in pulmonary function test FVC decline greater than 10% just in the last 3 months.  DLCO also has declined.  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  He states he is now accepted and come to terms with this decline.  Therefore he feels a little bit better.  His symptom score  is roughly the same.  Last visit I referred him for  repeat right heart catheterization.  He says he still has not heard from them.  He is very interested in pursuing this approach even though the most recent right heart catheterization was in March 2022.  This is because his hypoxemia onset is since then and if there is increased pulmonary artery pressures then you will qualify for standard of care in treprostinil.  We discussed clinical trial as a care option.  Currently IV infusion study based from media [place] is close to enrollment internationally.  He could be eligible for inhaled nitric oxide versus placebo study that looks at improved mobility.  Previously because of his back issues and ongoing schedule issues he declined.  He is also dealing with estrangement with his daughter and his wife having advanced dementia and a sister-in-law passing away.  He could also be potentially eligible for phase 3 inhaled treprostinil in patients with IPF but without pulmonary hypertension.  However he wants to undergo standard of critical care procedures first.  Therefore have written a to Dr. Loralie Champagne and Dr. Glori Bickers about right heart catheterization.  Of note: For the last week he is having some increased cough with yellow sputum.  He is requesting a repeat antibiotic and prednisone burst.  He is on daily prednisone 5 mg for cough.  He is requesting a refill on his chronic opioids which I did.         SYMPTOM SCALE - ILD 02/16/2019  06/08/2019  04/11/2020  06/11/2020  11/12/2020 169# weight 01/24/2021 171# 03/11/2021 175# 06/13/2021 174# - covid end emay 2022 08/14/2021 172# 09/11/2021 167# 09/24/2021 169#  O2 use _0  ra ra Uses o2 with exercise. Mowing yard 2L rest, 3L exertion 2 L rest, 3l exertion 2-4L  2Shortness of Breath 0 -> 5 scale with 5 being worst (score 6 If unable to do)            2At rest 1 1 0 _1 Simple tasks - showers, clothes change, eating, shaving _2 Household (dishes, doing  bed, laundry) _3 Shopping _4 Walking level at own pace _5 Walking up Stairs _6 Total (40 - 48) Dyspnea Score _7 couyg 3 Xx - due to recent pred _8 Did not answer 1 3  How bad is your fatigue 3 x _9 0 _10 nausea   0 0 0 0 0 0 0 0 0  vomit   0 0 0 0 0 0 0 0 0  diarrhea   _11 0 2  anxiety   0 0 0 0 0 0 1 00 0  deopresso   0 0 0 0 0 0 0 0 0       Simple office walk 185 feet x  3 laps goal with forehead probe 09/19/2018  01/12/2019  02/16/2019  06/08/2019  04/11/2020  06/11/2020  11/12/2020  01/24/2021  03/11/2021  08/14/2021  09/11/2021  O2 used Room air Room air Room air Room air  ra ra ra ra 2L [place] Walk RA 86%  Number laps completed _0 of 2 laps only .f   Comments about pace normal normal normal normal  Mod pace avg [ace Slow pace     Resting Pulse Ox/HR 100% and 73/min 100% ad 74/min 100% and 68/min 98% and 82/min 99% and 60/min 98% and 54 98% and 73/min 94% and 88/min 97% RA at rest 97% and HR 85 97% 2L [place] at rest, HR 73  Final Pulse Ox/HR 93% and 85/min 92% and 84/min 93% and 81/min 91% and 96/mni 91% and 74/mm 90% and 71/min 90% and 86/min 87% and 95/min desats to 87% on 2nd lap 93% ad HR 92 aat 1 lap and felt weak 4L [place] did all 3 laps - maintainted > 88%  Desaturated </= 88% no no no no yes yes yes yes     Desaturated <= 3% points Yes, 7 poin Yes, 8 ponts Yes, 7 points Yes, 7 points Yes, 8point Yes, 8 point Yes, 8 points Yes, 7 points     Got Tachycardic >/= 90/min no no no yes         Symptoms at end of test x none none none dyspnea none None dyspnea dyspnea  weak   Miscellaneous comments x x stable stable but tachy for first time     Corrected with 3L [place] - just 1 lap. Not all 3 laps tested on 3L [place] Premature stopping wihtout desats     PFT  PFT Results Latest Ref Rng & Units 09/24/2021 06/12/2021 03/11/2021 06/11/2020 04/11/2020  01/12/2019 10/13/2018  FVC-Pre L 1.86 1.97 2.10 2.26 2.23 2.21 2.25  FVC-Predicted Pre % 48 50 53 57 56 56 57  FVC-Post L - - - - - 2.25 -  FVC-Predicted Post % - - - - - 57 -  Pre FEV1/FVC % % 90 89 86 88 89 87 86  Post FEV1/FCV % % - - - - - 90 -  FEV1-Pre L 1.68 1.74 1.81 2.00 1.99 1.93 1.93  FEV1-Predicted Pre % 60 62 64 70 69 67 67  FEV1-Post L - - - - - 2.03 -  DLCO uncorrected ml/min/mmHg 10.95 13.83 11.41 13.69 14.72 14.38 14.17  DLCO UNC% % 46 59 48 58 62 50 50  DLCO corrected ml/min/mmHg 11.02 13.83 11.51 13.69 14.76 - -  DLCO COR %Predicted % 47 59 48 58 62 - -  DLVA Predicted % 96 114 89 96 110 102 94  TLC L - - - - - - -  TLC % Predicted % - - - - - - -  RV % Predicted % - - - - - - -       has a past medical history of Allergy, Arthritis, Atrial flutter (HCC), Cataract, Dysrhythmia, GERD (gastroesophageal reflux disease), H/O hiatal hernia, History of kidney stones, Hyperlipidemia, Injury of right hand, Pulmonary fibrosis ([place]), Renal stone (06/2014), and Ulcer.   reports that he quit smoking about 42 years ago. His smoking use included cigarettes. He has a 16.00 pack-year smoking history. He has never used smokeless tobacco.  Past Surgical History:  Procedure Laterality Date   ATRIAL FLUTTER ABLATION N/A 04/08/2015   Procedure: ATRIAL FLUTTER ABLATION;  Surgeon: Evans Lance, MD;  Location: [facility] CATH LAB;  Service: Cardiovascular;  Laterality: N/A;   CATARACT EXTRACTION, BILATERAL  2016   COLONOSCOPY  03/01/2017   per Dr. Loletha Carrow, clear, repeat in 5 yrs (brother had colon cancer)    ESOPHAGOGASTRODUODENOSCOPY  2007   EXTRACORPOREAL SHOCK WAVE LITHOTRIPSY  06/17/2014   per Dr. Jeffie Pollock    HAND SURGERY Right    LUMBAR LAMINECTOMY/DECOMPRESSION MICRODISCECTOMY Left 06/26/2021   Procedure: MICRODISCECTOMY LUMBAR FOUR-LUMBAR FIVE;  Surgeon: Consuella Lose, MD;  Location: [place];  Service: Neurosurgery;  Laterality: Left;   LUNG BIOPSY Left 03/22/2013   Procedure: LUNG  BIOPSY;  Surgeon: Melrose Nakayama, MD;  Location: [facility];  Service: Thoracic;  Laterality: Left;   POLYPECTOMY     RIGHT HEART CATH N/A 02/21/2021   Procedure: RIGHT HEART CATH;  Surgeon: Jolaine Artist, MD;  Location: [place] [facility];  Service: Cardiovascular;  Laterality: N/A;   TONSILLECTOMY     UPPER GASTROINTESTINAL ENDOSCOPY     VIDEO ASSISTED THORACOSCOPY Left 03/22/2013   Procedure: VIDEO ASSISTED THORACOSCOPY;  Surgeon: Melrose Nakayama, MD;  Location: [facility];  Service: Thoracic;  Laterality: Left;    No Known Allergies  Immunization History  Administered Date(s) Administered   Fluad Quad(high Dose 65+) 08/25/2019   Influenza Split 09/13/2013   Influenza, High Dose Seasonal PF 10/06/2016, 09/16/2017, 08/17/2018, 09/01/2021   Influenza-Unspecified 09/07/2014, 09/14/2015, 09/13/2020   PFIZER(Purple Top)SARS-COV-2 Vaccination 02/12/2020, 03/12/2020, 08/14/2020   Pfizer Covid-19 Vaccine Bivalent Booster 16yr & up 09/16/2021   Pneumococcal Conjugate-13 02/16/2017   Pneumococcal Polysaccharide-23 05/26/2013   Td 12/15/2007   Tdap 02/23/2018   Zoster Recombinat (Shingrix) 10/13/2018, 07/25/2019   Zoster, Live 02/19/2014    Family History  Problem Relation Age of Onset   Liver cancer Mother    Diabetes Sister    Lung cancer Sister    Heart disease Brother    Diabetes Brother    Colon cancer Brother    Diabetes Other    Cancer Other        prostate   Colon polyps Neg Hx    Esophageal cancer Neg Hx    Rectal cancer Neg Hx    Stomach cancer Neg Hx      Current Outpatient Medications:    albuterol (VENTOLIN HFA) 108 (90 Base) MCG/ACT inhaler, Inhale 1-2 puffs into the lungs every 6 (six) hours as needed for wheezing or shortness of breath., Disp: 18 g, Rfl: 1   flecainide (TAMBOCOR) 50 MG tablet, Take 1 tablet (50 mg total) by mouth 3 (three) times daily., Disp: 270 tablet, Rfl: 6   fluticasone (FLONASE) 50 MCG/ACT nasal spray, Place 2 sprays into both  nostrils daily as needed for allergies., Disp: , Rfl:    Nintedanib (OFEV) 150 MG CAPS, Take 1 capsule (150 mg total) by mouth 2 (two) times daily., Disp: , Rfl:    pantoprazole (PROTONIX) 40 MG tablet, TAKE 1 TABLET BY MOUTH TWICE A DAY, Disp: 180 tablet, Rfl: 2   predniSONE (DELTASONE) 5 MG tablet, Take 1 tablet (5 mg total) by mouth daily with breakfast., Disp: , Rfl:    zolpidem (AMBIEN) 5 MG tablet, Take 1 tablet (5 mg total) by mouth at bedtime as needed for sleep., Disp: , Rfl:    chlorpheniramine-HYDROcodone (TUSSIONEX PENNKINETIC ER) 10-8 MG/5ML SUER, Take 5 mLs by mouth 2 (two) times daily as needed for cough (PRN, cough, ipf, chronic cough, palliation)., Disp: 140 mL, Rfl: 0  Current Facility-Administered Medications:    chlorpheniramine-HYDROcodone (TUSSIONEX) 10-8 MG/5ML suspension 5 mL, 5 mL, Oral, Q12H PRN, RBrand Males MD      Objective:   Vitals:  09/24/21 1112  BP: 116/64  Pulse: 61  Temp: (!) 97.1 F (36.2 C)  TempSrc: Oral  SpO2: 99%  Weight: 169 lb (76.7 kg)  Height: 5' 7.5" (1.715 m)    Estimated body mass index is 26.08 kg/m as calculated from the following:   Height as of this encounter: 5' 7.5" (1.715 m).   Weight as of this encounter: 169 lb (76.7 kg).  _0 @  Filed Weights   09/24/21 1112  Weight: 169 lb (76.7 kg)     Physical Exam General: No distress. Looks well Neuro: Alert and Oriented x 3. GCS 15. Speech normal Psych: Pleasant Resp:  Barrel Chest - no.  Wheeze - no, Crackles - yes, No overt respiratory distress CVS: Normal heart sounds. Murmurs - no Ext: Stigmata of Connective Tissue Disease - no HEENT: Normal upper airway. PEERL +. No post nasal drip        Assessment:       ICD-10-CM   1. IPF (idiopathic pulmonary fibrosis) ([place])  J84.112     2. Chronic respiratory failure with hypoxia (HCC)  J96.11     3. Chronic cough  R05.3     4. Acute bronchitis, unspecified organism  J20.9          Plan:      Patient Instructions     ICD-10-CM   1. IPF (idiopathic pulmonary fibrosis) ([place])  J84.112     2. Chronic respiratory failure with hypoxia (HCC)  J96.11     3. Chronic cough  R05.3     4. Acute bronchitis, unspecified organism  J20.9         -You are having dramatic decline in your shortness of breath and symptoms following spinal surgery on 07/30/2021 and through 09/11/2021. Lung function test shows 10-15% decline in lung functio between June 2022 and 09/24/2021. There definitely is worsening  IPF therefore   - now needing 2L O2 even a trest  - currently also mild acute bronchitis (possible)  Plan - 2L o2 rest, 4L Fate o2 with exertion - keep pulse ox > 88% at home with exertion - re-refer Dr Haroldine Laws for Right heart cath - if pressurs up we can consider tyvaso nebulizer - I have messaged them  - continue Ofev  - if pulm artery pressures normal, then can consider TETON (inhaled tyvaso - above) study for IPF or inhaled Nitric Oxide study  - refill opioid cough syrup  - for bronchitis - Take prednisone 40 mg daily x 2 days, then 63m daily x 2 days, then 161mdaily x 2 days, then 79m67maily baseline - Take doxycycline 100m30m twice daily x 5 days; take after meals and avoid sunlight      Follow-up - 6 -8 week followup Dr RamaChase Caller30 min visit - Symptoms socre and walk test on 2L [place] at followu  ( Level 05 visit: Estb 40-54 min visit type: on-site physical face to visit  in total care time and counseling or/and coordination of care by this undersigned MD - Dr MuraBrand Malesis includes one or more of the following on this same day 09/24/2021: pre-charting, chart review, note writing, documentation discussion of test results, diagnostic or treatment recommendations, prognosis, risks and benefits of management options, instructions, education, compliance or risk-factor reduction. It excludes time spent by the [facility] staff in the care of the patient. Actual time  40 m55)   SIGNATURE    Dr. MuraBrand MalesD., F.C.C.P,  Pulmonary and Critical  Care Medicine Staff Physician, [place] Director - Interstitial Lung Disease  Program  Pulmonary [place] at [place], [place], [address]  Pager: [phone], If no answer or between  15:00h - 7:00h: call [phone] Telephone: [phone]  12:16 PM 09/24/2021

## 2021-09-24 NOTE — Telephone Encounter (Signed)
Hi Tanner Bishop/Dan  His PFT show decline. 09/24/2021 - most of worsening in past 3 months after spine surgery So possible he has new incrased in PA pressures. He is keen to get RHC repeat done. Says not heard from office as yet.   Thanks    SIGNATURE    Dr. Brand Males, M.D., F.C.C.P,  Pulmonary and Critical Care Medicine Staff Physician, [facility] Director - Interstitial Lung Disease  Program  Pulmonary [place] at [place], [place], [address]  NPI Number:  NPI #[id number]  Pager: [phone], If no answer  -> Check AMION or Try [phone] Telephone (clinical office): [phone] Telephone (research): [phone]  12:04 PM 09/24/2021

## 2021-09-25 DIAGNOSIS — J8410 Pulmonary fibrosis, unspecified: Secondary | ICD-10-CM | POA: Diagnosis not present

## 2021-09-26 NOTE — Telephone Encounter (Signed)
Attempted to contact patient for procedure preferences [facility]

## 2021-09-29 ENCOUNTER — Encounter (HOSPITAL_COMMUNITY): Payer: Self-pay | Admitting: Cardiology

## 2021-09-29 NOTE — Addendum Note (Signed)
Addended by: Ivann Trimarco, Sharlot Gowda on: 09/29/2021 01:17 PM   Modules accepted: Orders

## 2021-09-29 NOTE — Telephone Encounter (Signed)
-  patient aware and voiced understanding Instructions sent via Amare Bail Soto  09/29/2021  You are scheduled for a Cardiac Catheterization on Friday, October 21 with Dr. Glori Bickers.  1. Please arrive at the [facility] (Main Entrance A) at [facility]: [address] [place],  [address] at 8:30 AM (This time is two hours before your procedure to ensure your preparation). Free valet parking service is available.   Special note: Every effort is made to have your procedure done on time. Please understand that emergencies sometimes delay scheduled procedures.  2. Diet: Do not eat solid foods after midnight.  The patient may have clear liquids until 5am upon the day of the procedure.  3. Labs: You will need to have blood drawn on , October 18 at the [facility]. You do not need to be fasting.  4. Medication instructions in preparation for your procedure:   Contrast Allergy: No     On the morning of your procedure, take your Aspirin and any morning medicines NOT listed above.  You may use sips of water.  5. Plan for one night stay--bring personal belongings. 6. Bring a current list of your medications and current insurance cards. 7. You MUST have a responsible person to drive you home. 8. Someone MUST be with you the first 24 hours after you arrive home or your discharge will be delayed. 9. Please wear clothes that are easy to get on and off and wear slip-on shoes.  Thank you for allowing [place] to care for you!   -- [place] [facility]

## 2021-10-01 ENCOUNTER — Encounter: Payer: Self-pay | Admitting: Family Medicine

## 2021-10-01 ENCOUNTER — Telehealth (INDEPENDENT_AMBULATORY_CARE_PROVIDER_SITE_OTHER): Payer: PPO | Admitting: Family Medicine

## 2021-10-01 ENCOUNTER — Other Ambulatory Visit: Payer: Self-pay

## 2021-10-01 ENCOUNTER — Ambulatory Visit (HOSPITAL_COMMUNITY)
Admission: RE | Admit: 2021-10-01 | Discharge: 2021-10-01 | Disposition: A | Discharge status: Home / Self Care | Payer: PPO | Source: Ambulatory Visit | Attending: Cardiology | Admitting: Cardiology

## 2021-10-01 ENCOUNTER — Other Ambulatory Visit (HOSPITAL_COMMUNITY): Payer: Self-pay

## 2021-10-01 DIAGNOSIS — M1612 Unilateral primary osteoarthritis, left hip: Secondary | ICD-10-CM

## 2021-10-01 DIAGNOSIS — I480 Paroxysmal atrial fibrillation: Secondary | ICD-10-CM | POA: Insufficient documentation

## 2021-10-01 DIAGNOSIS — M1990 Unspecified osteoarthritis, unspecified site: Secondary | ICD-10-CM | POA: Insufficient documentation

## 2021-10-01 LAB — BASIC METABOLIC PANEL
Anion gap: 6 (ref 5–15)
BUN: 23 mg/dL (ref 8–23)
CO2: 28 mmol/L (ref 22–32)
Calcium: 8.9 mg/dL (ref 8.9–10.3)
Chloride: 102 mmol/L (ref 98–111)
Creatinine, Ser: 1.19 mg/dL (ref 0.61–1.24)
GFR, Estimated: >60 mL/min (ref >60)
Glucose, Bld: 106 mg/dL — ABNORMAL HIGH (ref 70–99)
Potassium: 5.2 mmol/L — ABNORMAL HIGH (ref 3.5–5.1)
Sodium: 136 mmol/L (ref 135–145)

## 2021-10-01 LAB — CBC
HCT: 43.1 % (ref 39.0–52.0)
Hemoglobin: 13.7 g/dL (ref 13.0–17.0)
MCH: 33.4 pg (ref 26.0–34.0)
MCHC: 31.8 g/dL (ref 30.0–36.0)
MCV: 105.1 fL — ABNORMAL HIGH (ref 80.0–100.0)
Platelets: 362 10*3/uL (ref 150–400)
RBC: 4.1 MIL/uL — ABNORMAL LOW (ref 4.22–5.81)
RDW: 15.6 % — ABNORMAL HIGH (ref 11.5–15.5)
WBC: 11.6 10*3/uL — ABNORMAL HIGH (ref 4.0–10.5)
nRBC: 0 % (ref 0.0–0.2)

## 2021-10-01 MED ORDER — MELOXICAM 15 MG PO TABS: 15.0000 mg | ORAL_TABLET | Freq: Every day | ORAL | 5 refills | Status: AC

## 2021-10-01 NOTE — Telephone Encounter (Signed)
Per patient's chart, RX was sent on 10/12.   Will close encounter.

## 2021-10-01 NOTE — Progress Notes (Signed)
   Subjective:    Patient ID: Tanner Bishop, male    DOB: [birth year], 75 y.o.   MRN: [id number]  HPI Virtual Visit via Telephone Note  I connected with the patient on 10/01/21 at  1:00 PM EDT by telephone and verified that I am speaking with the correct person using two identifiers.   I discussed the limitations, risks, security and privacy concerns of performing an evaluation and management service by telephone and the availability of in person appointments. I also discussed with the patient that there may be a patient responsible charge related to this service. The patient expressed understanding and agreed to proceed.  Location patient: home Location provider: work or home office Participants present for the call: patient, provider Patient did not have a visit in the prior 7 days to address this/these issue(s).   History of Present Illness: Here to follow up on left hip pain. He saw Dr. Vanetta Mulders (orthopedics) on 08-19-21 and he took an Xray of the hip which showed mild osteoarthritis. He told Tanner Bishop that he is not a surgical candidate. He gave him a steroid injection, and this helped a little. He could not tolerate the Diclofenac I gave him due to stomach upset. He is currently taking Aleve and Tylenol.    Observations/Objective: Patient sounds cheerful and well on the phone. I do not appreciate any SOB. Speech and thought processing are grossly intact. Patient reported vitals:  Assessment and Plan: OA of the left hip. He will try taking Meloxicam 15 mg daily. Follow up as needed. Alysia Penna, MD   Follow Up Instructions:     [phone] 5-10 [phone] 11-20 [id number] 21-30 I did not refer this patient for an OV in the next 24 hours for this/these issue(s).  I discussed the assessment and treatment plan with the patient. The patient was provided an opportunity to ask questions and all were answered. The patient agreed with the plan and demonstrated an understanding of the  instructions.   The patient was advised to call back or seek an in-person evaluation if the symptoms worsen or if the condition fails to improve as anticipated.  I provided 13 minutes of non-face-to-face time during this encounter.   Alysia Penna, MD     Review of Systems     Objective:   Physical Exam        Assessment & Plan:

## 2021-10-03 ENCOUNTER — Other Ambulatory Visit: Payer: Self-pay

## 2021-10-03 ENCOUNTER — Encounter (HOSPITAL_COMMUNITY): Payer: Self-pay | Admitting: Internal Medicine

## 2021-10-03 ENCOUNTER — Encounter (HOSPITAL_COMMUNITY)
Admission: RE | Disposition: A | Discharge status: Home / Self Care | Payer: Self-pay | Source: Ambulatory Visit | Attending: Internal Medicine

## 2021-10-03 ENCOUNTER — Ambulatory Visit (HOSPITAL_COMMUNITY)
Admission: RE | Admit: 2021-10-03 | Discharge: 2021-10-03 | Disposition: A | Discharge status: Home / Self Care | Payer: PPO | Source: Ambulatory Visit | Attending: Internal Medicine | Admitting: Internal Medicine

## 2021-10-03 DIAGNOSIS — J9611 Chronic respiratory failure with hypoxia: Secondary | ICD-10-CM | POA: Diagnosis not present

## 2021-10-03 DIAGNOSIS — J84112 Idiopathic pulmonary fibrosis: Secondary | ICD-10-CM | POA: Diagnosis not present

## 2021-10-03 DIAGNOSIS — Z87891 Personal history of nicotine dependence: Secondary | ICD-10-CM | POA: Insufficient documentation

## 2021-10-03 DIAGNOSIS — R053 Chronic cough: Secondary | ICD-10-CM | POA: Insufficient documentation

## 2021-10-03 DIAGNOSIS — J209 Acute bronchitis, unspecified: Secondary | ICD-10-CM | POA: Insufficient documentation

## 2021-10-03 DIAGNOSIS — I2721 Secondary pulmonary arterial hypertension: Secondary | ICD-10-CM | POA: Diagnosis not present

## 2021-10-03 DIAGNOSIS — Z79899 Other long term (current) drug therapy: Secondary | ICD-10-CM | POA: Diagnosis not present

## 2021-10-03 HISTORY — PX: RIGHT HEART CATH: CATH118263

## 2021-10-03 LAB — POCT I-STAT EG7
Acid-Base Excess: 1 mmol/L (ref 0.0–2.0)
Acid-Base Excess: 2 mmol/L (ref 0.0–2.0)
Acid-Base Excess: 3 mmol/L — ABNORMAL HIGH (ref 0.0–2.0)
Bicarbonate: 27.4 mmol/L (ref 20.0–28.0)
Bicarbonate: 27.9 mmol/L (ref 20.0–28.0)
Bicarbonate: 29.3 mmol/L — ABNORMAL HIGH (ref 20.0–28.0)
Calcium, Ion: 1.08 mmol/L — ABNORMAL LOW (ref 1.15–1.40)
Calcium, Ion: 1.1 mmol/L — ABNORMAL LOW (ref 1.15–1.40)
Calcium, Ion: 1.21 mmol/L (ref 1.15–1.40)
HCT: 36 % — ABNORMAL LOW (ref 39.0–52.0)
HCT: 36 % — ABNORMAL LOW (ref 39.0–52.0)
HCT: 38 % — ABNORMAL LOW (ref 39.0–52.0)
Hemoglobin: 12.2 g/dL — ABNORMAL LOW (ref 13.0–17.0)
Hemoglobin: 12.2 g/dL — ABNORMAL LOW (ref 13.0–17.0)
Hemoglobin: 12.9 g/dL — ABNORMAL LOW (ref 13.0–17.0)
O2 Saturation: 60 %
O2 Saturation: 61 %
O2 Saturation: 66 %
Potassium: 3.5 mmol/L (ref 3.5–5.1)
Potassium: 3.6 mmol/L (ref 3.5–5.1)
Potassium: 3.9 mmol/L (ref 3.5–5.1)
Sodium: 139 mmol/L (ref 135–145)
Sodium: 141 mmol/L (ref 135–145)
Sodium: 142 mmol/L (ref 135–145)
TCO2: 29 mmol/L (ref 22–32)
TCO2: 29 mmol/L (ref 22–32)
TCO2: 31 mmol/L (ref 22–32)
pCO2, Ven: 46.3 mmHg (ref 44.0–60.0)
pCO2, Ven: 47.5 mmHg (ref 44.0–60.0)
pCO2, Ven: 50.2 mmHg (ref 44.0–60.0)
pH, Ven: 7.368 (ref 7.250–7.430)
pH, Ven: 7.374 (ref 7.250–7.430)
pH, Ven: 7.388 (ref 7.250–7.430)
pO2, Ven: 32 mmHg (ref 32.0–45.0)
pO2, Ven: 33 mmHg (ref 32.0–45.0)
pO2, Ven: 35 mmHg (ref 32.0–45.0)

## 2021-10-03 SURGERY — RIGHT HEART CATH
Anesthesia: LOCAL

## 2021-10-03 MED ORDER — SODIUM CHLORIDE 0.9 % IV SOLN: 250.0000 mL | INTRAVENOUS | Status: DC | PRN

## 2021-10-03 MED ORDER — SODIUM CHLORIDE 0.9% FLUSH: 3.0000 mL | Freq: Two times a day (BID) | INTRAVENOUS | Status: DC

## 2021-10-03 MED ORDER — SODIUM CHLORIDE 0.9 % IV SOLN: INTRAVENOUS | Status: DC

## 2021-10-03 MED ORDER — ONDANSETRON HCL 4 MG/2ML IJ SOLN: 4.0000 mg | Freq: Four times a day (QID) | INTRAMUSCULAR | Status: DC | PRN

## 2021-10-03 MED ORDER — LIDOCAINE HCL (PF) 1 % IJ SOLN
INTRAMUSCULAR | Status: AC
  Filled 2021-10-03: qty 30

## 2021-10-03 MED ORDER — HEPARIN (PORCINE) IN NACL 1000-0.9 UT/500ML-% IV SOLN
INTRAVENOUS | Status: AC
  Filled 2021-10-03: qty 500

## 2021-10-03 MED ORDER — ACETAMINOPHEN 325 MG PO TABS: 650.0000 mg | ORAL_TABLET | ORAL | Status: DC | PRN

## 2021-10-03 MED ORDER — LIDOCAINE HCL (PF) 1 % IJ SOLN
INTRAMUSCULAR | Status: DC | PRN
  Administered 2021-10-03: 2 mL

## 2021-10-03 MED ORDER — SODIUM CHLORIDE 0.9% FLUSH: 3.0000 mL | INTRAVENOUS | Status: DC | PRN

## 2021-10-03 MED ORDER — LABETALOL HCL 5 MG/ML IV SOLN: 10.0000 mg | INTRAVENOUS | Status: DC | PRN

## 2021-10-03 MED ORDER — HEPARIN (PORCINE) IN NACL 1000-0.9 UT/500ML-% IV SOLN
INTRAVENOUS | Status: DC | PRN
  Administered 2021-10-03: 500 mL

## 2021-10-03 MED ORDER — HYDRALAZINE HCL 20 MG/ML IJ SOLN: 10.0000 mg | INTRAMUSCULAR | Status: DC | PRN

## 2021-10-03 SURGICAL SUPPLY — 6 items
CATH BALLN WEDGE 5F 110CM (CATHETERS) ×1 IMPLANT
PACK CARDIAC CATHETERIZATION (CUSTOM PROCEDURE TRAY) ×2 IMPLANT
SHEATH GLIDE SLENDER 4/5FR (SHEATH) ×1 IMPLANT
TRANSDUCER W/STOPCOCK (MISCELLANEOUS) ×2 IMPLANT
TUBING ART PRESS 72  MALE/FEM (TUBING) ×2
TUBING ART PRESS 72 MALE/FEM (TUBING) IMPLANT

## 2021-10-03 NOTE — Interval H&P Note (Signed)
History and Physical Interval Note:  10/03/2021 9:46 AM  Tanner Bishop  has presented today for surgery, with the diagnosis of pulmonary hypertension.  The various methods of treatment have been discussed with the patient and family. After consideration of risks, benefits and other options for treatment, the patient has consented to  Procedure(s): RIGHT HEART CATH (N/A) as a surgical intervention.  The patient's history has been reviewed, patient examined, no change in status, stable for surgery.  I have reviewed the patient's chart and labs.  Questions were answered to the patient's satisfaction.     [name]

## 2021-10-04 ENCOUNTER — Other Ambulatory Visit: Payer: Self-pay | Admitting: Primary Care

## 2021-10-07 ENCOUNTER — Telehealth: Payer: Self-pay | Admitting: Internal Medicine

## 2021-10-07 NOTE — Telephone Encounter (Signed)
Called and spoke with patient. He verbalized understanding of results. He wishes to proceed with Tyvaso. He is aware that I will send a message to our pharmacy team and they will able to provide him with more information in regards to the Tyvaso.

## 2021-10-07 NOTE — Telephone Encounter (Signed)
  At this time around his pulmonary artery pressures have increased and he technically meets criteria for inhaled treprostinil [see below in red).  I recommend that he try inhaled treprostinil dry powder inhaler.  Please let him know and once he agrees please forward this message to pharmacist Knox Saliva  Thanks   SIGNATURE    Dr. Brand Males, M.D., F.C.C.P,  Pulmonary and Critical Care Medicine Staff Physician, [place] Director - Interstitial Lung Disease  Program  Pulmonary [place] at [place], [place], [address]  NPI Number:  NPI #[id number]  Pager: [phone], If no answer  -> Check AMION or Try [phone] Telephone (clinical office): [phone] Telephone (research): [phone]  5:12 PM 10/07/2021 g   xxxxxxxx For Tyvaso Include in patients with ILD   - Right heart cath :  PVR > 3, PCWP </= 15, Pmap >/=  25 -  Patient needed to be able to walk 166m/ 300 feet on a 642malk test Exclude - LVEF < 40%,  - Baseline o2 > 10L -   xxxx   Findings:10/03/21 RHC   RA = 2 RV = 46/5 PA = 44/14 (25) PCW = 4 Fick cardiac output/index = 4.9/2.6 PVR = 4.3 Ao sat = 96% PA sat = 61%, 65% SVC sat = 60% PAPi = 15   Assessment: 1. Mild PAH    Plan/Discussion:    PAH remains mild but pressures increased from previous RHC earlier this year. Would consider trial of inhaled Tyvaso in setting of IPF.    DaGlori BickersMD  11:05 AM

## 2021-10-07 NOTE — Telephone Encounter (Signed)
Spoke to patient who is requesting heart cath results.  He has not contacted cardiology for results.  He would like to know next steps.  [name], please advise. thanks

## 2021-10-08 NOTE — Telephone Encounter (Signed)
Patient has been scheduled for 220pm tomorrow.

## 2021-10-09 ENCOUNTER — Ambulatory Visit: Payer: PPO | Admitting: Pharmacist

## 2021-10-09 ENCOUNTER — Telehealth: Payer: Self-pay | Admitting: Pharmacist

## 2021-10-09 ENCOUNTER — Other Ambulatory Visit: Payer: Self-pay

## 2021-10-09 DIAGNOSIS — Z79899 Other long term (current) drug therapy: Secondary | ICD-10-CM

## 2021-10-09 NOTE — Progress Notes (Signed)
HPI  Patient presents today to [facility] for Initial visit with pharmacy team for Tyvaso DPI counseling. Pertinent past medical history includes IPF, history of paroxysmal atrial fibrillation, atrial flutter, GERD, hyperlipidemia, history of colonic polyps.   RHC completed on 10/03/21 and patient deemed to meet criteria for Tyvaso treatment.  Therapy for ILD: Ofev 159m twice daily since May 2015  Anticoagulant use: No  OBJECTIVE No Known Allergies  Outpatient Encounter Medications as of 10/09/2021  Medication Sig   albuterol (VENTOLIN HFA) 108 (90 Base) MCG/ACT inhaler INHALE 1-2 PUFFS BY MOUTH EVERY 6 HOURS AS NEEDED FOR WHEEZE OR SHORTNESS OF BREATH   chlorpheniramine-HYDROcodone (TUSSIONEX PENNKINETIC ER) 10-8 MG/5ML SUER Take 5 mLs by mouth 2 (two) times daily as needed for cough (PRN, cough, ipf, chronic cough, palliation).   Cholecalciferol (VITAMIN D) 50 MCG (2000 UT) CAPS Take 2,000 Units by mouth daily.   flecainide (TAMBOCOR) 50 MG tablet Take 1 tablet (50 mg total) by mouth 3 (three) times daily.   fluticasone (FLONASE) 50 MCG/ACT nasal spray Place 2 sprays into both nostrils daily as needed for allergies. (Patient taking differently: Place 2 sprays into both nostrils 2 (two) times daily as needed for allergies.)   meloxicam (MOBIC) 15 MG tablet Take 1 tablet (15 mg total) by mouth daily.   Nintedanib (OFEV) 150 MG CAPS Take 1 capsule (150 mg total) by mouth 2 (two) times daily.   pantoprazole (PROTONIX) 40 MG tablet TAKE 1 TABLET BY MOUTH TWICE A DAY   predniSONE (DELTASONE) 5 MG tablet Take 1 tablet (5 mg total) by mouth daily with breakfast.   zolpidem (AMBIEN) 5 MG tablet Take 1 tablet (5 mg total) by mouth at bedtime as needed for sleep.   No facility-administered encounter medications on file as of 10/09/2021.     Immunization History  Administered Date(s) Administered   Fluad Quad(high Dose 65+) 08/25/2019   Influenza Split 09/13/2013   Influenza, High  Dose Seasonal PF 10/06/2016, 09/16/2017, 08/17/2018, 09/01/2021   Influenza-Unspecified 09/07/2014, 09/14/2015, 09/13/2020   PFIZER(Purple Top)SARS-COV-2 Vaccination 02/12/2020, 03/12/2020, 08/14/2020   Pfizer Covid-19 Vaccine Bivalent Booster 195yr& up 09/16/2021   Pneumococcal Conjugate-13 02/16/2017   Pneumococcal Polysaccharide-23 05/26/2013   Td 12/15/2007   Tdap 02/23/2018   Zoster Recombinat (Shingrix) 10/13/2018, 07/25/2019   Zoster, Live 02/19/2014     HRCT (08/20/21) - The appearance of the lungs is compatible with interstitial lung disease, with a spectrum of findings considered diagnostic of usual interstitial pneumonia (UIP) per current ATS guidelines, as detailed above. Minimal progression of disease compared to the prior study.  PFT's TLC  Date Value Ref Range Status  05/19/2017 3.72 L Final     CMP     Component Value Date/Time   NA 142 10/03/2021 1049   NA 137 02/18/2021 1056   K 3.5 10/03/2021 1049   CL 102 10/01/2021 1102   CO2 28 10/01/2021 1102   GLUCOSE 106 (H) 10/01/2021 1102   BUN 23 10/01/2021 1102   BUN 14 02/18/2021 1056   CREATININE 1.19 10/01/2021 1102   CREATININE 1.19 (H) 08/05/2020 1140   CALCIUM 8.9 10/01/2021 1102   PROT 6.6 08/14/2021 1439   ALBUMIN 3.8 08/14/2021 1439   AST 18 08/14/2021 1439   ALT 12 08/14/2021 1439   ALKPHOS 56 08/14/2021 1439   BILITOT 0.4 08/14/2021 1439   GFRNONAA >60 10/01/2021 1102   GFRAA 64 01/22/2021 1507     CBC    Component Value Date/Time   WBC 11.6 (  H) 10/01/2021 1102   RBC 4.10 (L) 10/01/2021 1102   HGB 12.2 (L) 10/03/2021 1049   HGB 14.3 02/18/2021 1056   HCT 36.0 (L) 10/03/2021 1049   HCT 42.6 02/18/2021 1056   PLT 362 10/01/2021 1102   PLT 305 02/18/2021 1056   MCV 105.1 (H) 10/01/2021 1102   MCV 94 02/18/2021 1056   MCH 33.4 10/01/2021 1102   MCHC 31.8 10/01/2021 1102   RDW 15.6 (H) 10/01/2021 1102   RDW 12.5 02/18/2021 1056   LYMPHSABS 1.4 08/14/2021 1442   MONOABS 0.6 08/14/2021 1442    EOSABS 0.1 08/14/2021 1442   BASOSABS 0.0 08/14/2021 1442     LFT's Hepatic Function Latest Ref Rng & Units 08/14/2021 06/13/2021 06/13/2021  Total Protein 6.0 - 8.3 g/dL 6.6 7.3 7.3  Albumin 3.5 - 5.2 g/dL 3.8 4.2 4.2  AST 0 - 37 U/L _0 ALT 0 - 53 U/L _1 Alk Phosphatase 39 - 117 U/L 56 49 49  Total Bilirubin 0.2 - 1.2 mg/dL 0.4 0.4 0.4  Bilirubin, Direct 0.0 - 0.3 mg/dL 0.1 - 0.1    ASSESSMENT  Tyvaso Medication Management Patient counseled on purpose, proper use, and common side effects of Tyvaso including cough, headache, nausea, dizziness, flushing, throat irritation and pharyngolaryngeal pain.  Reviewed less common but serious side effects including low blood pressure and increased bleeding risk.  Goals of therapy: increase mobility (improved 6MWT), imrpvoed amount of strain on heart based on NT-proBNP), functional quality of life as measured by improved 6MWT, reduced strain on heart (based on NT-proBNP), and reduced risk of PH-ILD progression  Dose for PH-ILD: 16 mcg 4 times per day administered every 4 hours while patient is awake; Increase by 16 mcg per treatment session every week to target dose is 72 mcg (12 inhalations) 4 times per day.   Adverse Effects: Common: cough, headache, nausea, dizziness, flushing, throat irritation and pharyngolaryngeal pain. Less frequent but serious: low blood pressure and increased bleeding risk  Access: Discussed coverage through medical/pharmacy benefit. Discussed specialty pharmacy nurse who makes home visit for first dose of Tyvaso.  Medication Reconciliation  A drug regimen assessment was performed, including review of allergies, interactions, disease-state management, dosing and immunization history. Medications were reviewed with the patient, including name, instructions, indication, goals of therapy, potential side effects, importance of adherence, and safe use.  Drug interaction(s): none noted  Anticoagulant use:  No  Immunizations  Patient is indicated for the influenzae, pneumonia, and shingles vaccinations.  PLAN - Complete Tyvaso referral form for [facility]: Phone: [phone]      Fax: [phone] and [employer] patient assistance paperwork. - Provider portion of paperwork signed by [name] today.  All questions encouraged and answered.  Instructed patient to call with any further questions or concerns.  Thank you for allowing pharmacy to participate in this patient's care.  This appointment required 60 minutes of patient care (this includes precharting, chart review, review of results, face-to-face care, etc.).   Knox Saliva, PharmD, MPH, BCPS Clinical Pharmacist (Rheumatology and Pulmonology)

## 2021-10-09 NOTE — Telephone Encounter (Signed)
Submitted completed referral paperwork to [facility] for TYVASO along with HRCT, right heart cath results, and most recent progress note.  Fax# [phone] Phone# [phone]  Knox Saliva, PharmD, MPH, BCPS Clinical Pharmacist (Rheumatology and Pulmonology)

## 2021-10-14 ENCOUNTER — Telehealth: Payer: Self-pay | Admitting: Internal Medicine

## 2021-10-15 NOTE — Telephone Encounter (Signed)
Submitted an URGENT Prior Authorization request to [facility] for TYVASO DPI via CoverMyMeds. Will update once we receive a response.  Key: [id number]  Knox Saliva, PharmD, MPH, BCPS Clinical Pharmacist (Rheumatology and Pulmonology)

## 2021-10-15 NOTE — Telephone Encounter (Signed)
Routing to correct pool

## 2021-10-15 NOTE — Telephone Encounter (Signed)
F/u will occur in Tyvaso DPI BIV encounter

## 2021-10-16 NOTE — Telephone Encounter (Signed)
Received notification from [facility] regarding a prior authorization for TYVASO DPI. Authorization has been APPROVED from 10/16/21 to 12/13/22.   Unable to run test claim as Tyvaso as limited distribution drug  Phone # [phone]  Faxed approval letter to [facility] Fax: [phone] Phone: [phone]  Knox Saliva, PharmD, MPH, BCPS Clinical Pharmacist (Rheumatology and Pulmonology)

## 2021-10-20 ENCOUNTER — Ambulatory Visit (INDEPENDENT_AMBULATORY_CARE_PROVIDER_SITE_OTHER): Payer: PPO | Admitting: Family Medicine

## 2021-10-20 ENCOUNTER — Encounter: Payer: Self-pay | Admitting: Family Medicine

## 2021-10-20 ENCOUNTER — Other Ambulatory Visit: Payer: Self-pay

## 2021-10-20 VITALS — BP 112/78 | HR 81 | Temp 97.8°F | Wt 167.4 lb

## 2021-10-20 DIAGNOSIS — M25552 Pain in left hip: Secondary | ICD-10-CM

## 2021-10-20 DIAGNOSIS — J8410 Pulmonary fibrosis, unspecified: Secondary | ICD-10-CM | POA: Diagnosis not present

## 2021-10-20 DIAGNOSIS — M1612 Unilateral primary osteoarthritis, left hip: Secondary | ICD-10-CM | POA: Diagnosis not present

## 2021-10-20 DIAGNOSIS — M25551 Pain in right hip: Secondary | ICD-10-CM | POA: Diagnosis not present

## 2021-10-20 DIAGNOSIS — G8929 Other chronic pain: Secondary | ICD-10-CM

## 2021-10-20 MED ORDER — TRAMADOL HCL 50 MG PO TABS: 50.0000 mg | ORAL_TABLET | Freq: Two times a day (BID) | ORAL | 0 refills | Status: AC | PRN

## 2021-10-20 NOTE — Patient Instructions (Signed)
You should contact your orthopedic provider in regards to your increased pain for additional options.  Also consider physical therapy.  If this is something you wish to do he can place an order for you.

## 2021-10-20 NOTE — Progress Notes (Signed)
Subjective:    Patient ID: Tanner Bishop, male    DOB: 1946/09/30, 75 y.o.   MRN: [id number]  Chief Complaint  Patient presents with   Shortness of Breath   Hip Pain    Started in left hip,now is in both hips and is getting worse. Pain is causing SOB    HPI Patient was seen today for acutely worsening chronic condition.  Patient endorses history of left hip OA becoming worse in the last week.  Seen by Ortho on 08/19/2021, imaging with mild primary arthritis left hip. S/p left hip steroid injection which did not help much.  Patient seen virtually 10/19 by PCP given Mobic which is not helping when alternated with Aleve.  Pain better with sitting.  When trying to walk pain is so bad it takes his breath.  Patient also tried Voltaren gel, wound emu cream, heat, and other topical analgesics.  Patient was not using dosing card and Voltaren gel package.  Pt notes right hip now starting to hurt with pain down left leg.  Denies loss of bowel or bladder, fever, chills, tingling.  Pt s/p surgery in July for left sciatic nerve symptoms.  Past Medical History:  Diagnosis Date   Allergy    Arthritis    Atrial flutter ([place])    a. s/p ablation 03/2015 Dr Lovena Le   Cataract    removed both eyes   Dysrhythmia    GERD (gastroesophageal reflux disease)    H/O hiatal hernia    History of kidney stones    Hyperlipidemia    Injury of right hand    permanent damage after workplace 2009 and 2010 Dr Langston [facility]   Pulmonary fibrosis [facility])    sees Dr. Onnie Graham    Renal stone 06/2014   Ulcer    unresolved    No Known Allergies  ROS General: Denies fever, chills, night sweats, changes in weight, changes in appetite HEENT: Denies headaches, ear pain, changes in vision, rhinorrhea, sore throat CV: Denies CP, palpitations, SOB, orthopnea + SOB Pulm: Denies SOB, cough, wheezing GI: Denies abdominal pain, nausea, vomiting, diarrhea, constipation GU: Denies dysuria, hematuria, frequency Msk:  Denies muscle cramps, joint pains  +L hip pain, L leg pain Neuro: Denies weakness, numbness, tingling Skin: Denies rashes, bruising Psych: Denies depression, anxiety, hallucinations     Objective:    Blood pressure 112/78, pulse 81, temperature 97.8 F (36.6 C), temperature source Oral, weight 167 lb 6.4 oz (75.9 kg), SpO2 97 %.  Gen. Pleasant, well-nourished, in no distress, normal affect on 2 L O2 via [place] HEENT: [place]/AT, face symmetric, conjunctiva clear, no scleral icterus, PERRLA, EOMI, nares patent without drainage Lungs: no accessory muscle [place] Cardiovascular: RRR, no peripheral edema Musculoskeletal: No TTP of cervical, thoracic, or lumbar spine.  Mild TTP of bilateral lumbar paraspinal muscles.  TTP of left lateral hip and thigh.  Positive logroll bilaterally.  Positive FADIR and FABER on Left.  Exam initially limited 2/2 patient cooperation, unable to relax to allow passive movement of leg.  No deformities, no cyanosis or clubbing, normal tone Neuro:  A&Ox3, CN II-XII intact, normal gait Skin:  Warm, no lesions/ rash   Wt Readings from Last 3 Encounters:  10/20/21 167 lb 6.4 oz (75.9 kg)  10/03/21 160 lb (72.6 kg)  09/24/21 169 lb (76.7 kg)    Lab Results  Component Value Date   WBC 11.6 (H) 10/01/2021   HGB 12.2 (L) 10/03/2021   HCT 36.0 (L) 10/03/2021   PLT  362 10/01/2021   GLUCOSE 106 (H) 10/01/2021   CHOL 225 (H) 04/03/2021   TRIG 90.0 04/03/2021   HDL 69.70 04/03/2021   LDLDIRECT 137.9 12/17/2010   LDLCALC 137 (H) 04/03/2021   ALT 12 08/14/2021   AST 18 08/14/2021   NA 142 10/03/2021   K 3.5 10/03/2021   CL 102 10/01/2021   CREATININE 1.19 10/01/2021   BUN 23 10/01/2021   CO2 28 10/01/2021   TSH 2.68 04/03/2021   PSA 1.60 04/03/2021   INR 1.0 06/13/2021   HGBA1C 6.4 04/03/2021    Assessment/Plan:  Primary osteoarthritis of left hip -Mild OA left hip noted on imaging from 08/19/2021 with Ortho -s/p injection in left hip without relief -Discussed various  options including topical analgesics, prescription meds, stretching, heat, water aerobics acupuncture, PT -Patient advised to continue follow-up with Ortho for further recommendations -Given limited supply of tramadol until Ortho appointment.  Chronic left hip pain -Likely 2/2 OA -Mild OA on x-ray from 08/19/2021 -Discussed proper use of Voltaren gel. -Care including stretching, exercising, PT.  Patient wishes to wait on PT. -Given limited supply of tramadol till follow-up with Ortho. -Advised to follow-up with Ortho - Plan: traMADol (ULTRAM) 50 MG tablet  Acute right hip pain -Likely 2/2 overuse/compensating for left hip pain -Supportive care stated above including gel 3 times daily -Follow-up with Ortho  - Plan: traMADol (ULTRAM) 50 MG tablet  Interstitial pulmonary fibrosis (HCC) -Stable on 2 L O2 via [place] -Continue current medications -Continue follow-up with pulmonology  F/u as needed with PCP  Grier Mitts, MD

## 2021-10-21 ENCOUNTER — Ambulatory Visit (INDEPENDENT_AMBULATORY_CARE_PROVIDER_SITE_OTHER): Payer: PPO | Admitting: Orthopaedic Surgery

## 2021-10-21 ENCOUNTER — Telehealth: Payer: PPO | Admitting: Family Medicine

## 2021-10-21 DIAGNOSIS — M25552 Pain in left hip: Secondary | ICD-10-CM

## 2021-10-21 DIAGNOSIS — M1612 Unilateral primary osteoarthritis, left hip: Secondary | ICD-10-CM

## 2021-10-21 MED ORDER — LIDOCAINE HCL 1 % IJ SOLN
4.0000 mL | INTRAMUSCULAR | Status: AC | PRN
  Administered 2021-10-21: 4 mL

## 2021-10-21 MED ORDER — TRIAMCINOLONE ACETONIDE 40 MG/ML IJ SUSP
80.0000 mg | INTRAMUSCULAR | Status: AC | PRN
  Administered 2021-10-21: 80 mg via INTRA_ARTICULAR

## 2021-10-21 NOTE — Progress Notes (Signed)
Chief Complaint: Left hip pain     History of Present Illness:   10/21/2021: Presents today for follow-up of the left hip.  He says that he got 2 months of very significant relief from this injection.  He said that this made his pain radiating down the leg feel much better.  [place] after [place] and was very active.  Tanner Bishop is a 75 y.o. male with left hip and buttock pain now for several months.  He states that he did have a lumbar spine surgery done in July 2022 after which he had no pain whatsoever in the leg.  He states he has been more active after that surgery and now experiences pain and what he describes as the buttock region.  This pain does not radiate down the leg.  He describes the pain as sore and that ultimately resting relieves the pain.  He is very active during the day.  He has been taking NSAIDs which helped somewhat for the pain.  He also endorses popping and clicking in the hip.  He has not had any previous injections.  He is scheduled to see a spine surgeon the following day    Surgical History:   No previous hip surgery  PMH/PSH/Family History/Social History/Meds/Allergies:    Past Medical History:  Diagnosis Date   Allergy    Arthritis    Atrial flutter ([place])    a. s/p ablation 03/2015 Dr Lovena Le   Cataract    removed both eyes   Dysrhythmia    GERD (gastroesophageal reflux disease)    H/O hiatal hernia    History of kidney stones    Hyperlipidemia    Injury of right hand    permanent damage after workplace 2009 and 2010 Dr Langston [facility]   Pulmonary fibrosis [facility])    sees Dr. Onnie Graham    Renal stone 06/2014   Ulcer    unresolved   Past Surgical History:  Procedure Laterality Date   ATRIAL FLUTTER ABLATION N/A 04/08/2015   Procedure: ATRIAL FLUTTER ABLATION;  Surgeon: Evans Lance, MD;  Location: [facility] CATH LAB;  Service: Cardiovascular;  Laterality: N/A;   CATARACT  EXTRACTION, BILATERAL  2016   COLONOSCOPY  03/01/2017   per Dr. Loletha Carrow, clear, repeat in 5 yrs (brother had colon cancer)    ESOPHAGOGASTRODUODENOSCOPY  2007   EXTRACORPOREAL SHOCK WAVE LITHOTRIPSY  06/17/2014   per Dr. Jeffie Pollock    HAND SURGERY Right    LUMBAR LAMINECTOMY/DECOMPRESSION MICRODISCECTOMY Left 06/26/2021   Procedure: MICRODISCECTOMY LUMBAR FOUR-LUMBAR FIVE;  Surgeon: Consuella Lose, MD;  Location: [place];  Service: Neurosurgery;  Laterality: Left;   LUNG BIOPSY Left 03/22/2013   Procedure: LUNG BIOPSY;  Surgeon: Melrose Nakayama, MD;  Location: [place];  Service: Thoracic;  Laterality: Left;   POLYPECTOMY     RIGHT HEART CATH N/A 02/21/2021   Procedure: RIGHT HEART CATH;  Surgeon: Jolaine Artist, MD;  Location: [place] CV LAB;  Service: Cardiovascular;  Laterality: N/A;   RIGHT HEART CATH N/A 10/03/2021   Procedure: RIGHT HEART CATH;  Surgeon: Jolaine Artist, MD;  Location: [place] CV LAB;  Service: Cardiovascular;  Laterality: N/A;   TONSILLECTOMY     UPPER GASTROINTESTINAL ENDOSCOPY     VIDEO ASSISTED THORACOSCOPY Left  03/22/2013   Procedure: VIDEO ASSISTED THORACOSCOPY;  Surgeon: Melrose Nakayama, MD;  Location: [facility] OR;  Service: Thoracic;  Laterality: Left;   Social History   Socioeconomic History   Marital status: Married    Spouse name: Not on file   Number of children: Not on file   Years of education: Not on file   Highest education level: Not on file  Occupational History   Occupation: LABORER    Employer: [employer]   Occupation: Disabled   Tobacco Use   Smoking status: Former    Packs/day: 1.00    Years: 16.00    Pack years: 16.00    Types: Cigarettes    Quit date: 12/14/1978    Years since quitting: 42.8   Smokeless tobacco: Never  Vaping Use   Vaping Use: Never used  Substance and Sexual Activity   Alcohol use: No    Alcohol/week: 0.0 standard drinks   Drug use: No   Sexual activity: Not on file  Other Topics Concern    Not on file  Social History Narrative   Not on file   Social Determinants of Health   Financial Resource Strain: Low Risk    Difficulty of Paying Living Expenses: Not hard at all  Food Insecurity: No Food Insecurity   Worried About Charity fundraiser in the Last Year: Never true   [place] in the Last Year: Never true  Transportation Needs: No Transportation Needs   Lack of Transportation (Medical): No   Lack of Transportation (Non-Medical): No  Physical Activity: Sufficiently Active   Days of Exercise per Week: 7 days   Minutes of Exercise per Session: 50 min  Stress: No Stress Concern Present   Feeling of Stress : Not at all  Social Connections: Socially Integrated   Frequency of Communication with Friends and Family: More than three times a week   Frequency of Social Gatherings with Friends and Family: More than three times a week   Attends Religious Services: More than 4 times per year   Active Member of [employer] or Organizations: Yes   Attends Music therapist: More than 4 times per year   Marital Status: Married   Family History  Problem Relation Age of Onset   Liver cancer Mother    Diabetes Sister    Lung cancer Sister    Heart disease Brother    Diabetes Brother    Colon cancer Brother    Diabetes Other    Cancer Other        prostate   Colon polyps Neg Hx    Esophageal cancer Neg Hx    Rectal cancer Neg Hx    Stomach cancer Neg Hx    No Known Allergies Current Outpatient Medications  Medication Sig Dispense Refill   albuterol (VENTOLIN HFA) 108 (90 Base) MCG/ACT inhaler INHALE 1-2 PUFFS BY MOUTH EVERY 6 HOURS AS NEEDED FOR WHEEZE OR SHORTNESS OF BREATH 18 each 1   chlorpheniramine-HYDROcodone (TUSSIONEX PENNKINETIC ER) 10-8 MG/5ML SUER Take 5 mLs by mouth 2 (two) times daily as needed for cough (PRN, cough, ipf, chronic cough, palliation). 140 mL 0   Cholecalciferol (VITAMIN D) 50 MCG (2000 UT) CAPS Take 2,000 Units by mouth daily.      flecainide (TAMBOCOR) 50 MG tablet Take 1 tablet (50 mg total) by mouth 3 (three) times daily. 270 tablet 6   fluticasone (FLONASE) 50 MCG/ACT nasal spray Place 2 sprays into both nostrils daily as needed for  allergies. (Patient taking differently: Place 2 sprays into both nostrils 2 (two) times daily as needed for allergies.)     meloxicam (MOBIC) 15 MG tablet Take 1 tablet (15 mg total) by mouth daily. 30 tablet 5   Nintedanib (OFEV) 150 MG CAPS Take 1 capsule (150 mg total) by mouth 2 (two) times daily.     pantoprazole (PROTONIX) 40 MG tablet TAKE 1 TABLET BY MOUTH TWICE A DAY 180 tablet 2   predniSONE (DELTASONE) 5 MG tablet Take 1 tablet (5 mg total) by mouth daily with breakfast.     traMADol (ULTRAM) 50 MG tablet Take 1 tablet (50 mg total) by mouth every 12 (twelve) hours as needed for up to 5 days. 10 tablet 0   zolpidem (AMBIEN) 5 MG tablet Take 1 tablet (5 mg total) by mouth at bedtime as needed for sleep.     No current facility-administered medications for this visit.   No results found.  Review of Systems:   A ROS was performed including pertinent positives and negatives as documented in the HPI.  Physical Exam :   Constitutional: NAD and appears stated age Neurological: Alert and oriented Psych: Appropriate affect and cooperative There were no vitals taken for this visit.   Comprehensive Musculoskeletal Exam:    Inspection Right Left  Skin No atrophy or gross abnormalities appreciated No atrophy or gross abnormalities appreciated  Palpation    Tenderness None None  Crepitus None None  Range of Motion    Flexion (passive) 120 120  Extension 30 30  IR 30 30 with pain  ER 40 40  Strength    Flexion  5/5 5/5  Extension 5/5 5/5  Special Tests    FABIR Negative Negative  FADER Negative Negative  ER Lag/Capsular Insufficiency Negative Negative  Instability Negative Negative  Sacroiliac pain Negative  Negative   Instability    Generalized Laxity No No  Neurologic     sciatic, femoral, obturator nerves intact to light sensation  Vascular/Lymphatic    DP pulse 2+ 2+  Lumbar Exam    Patient has symmetric lumbar range of motion with negative pain referral to hip     Imaging:   Xray (left hip 3 views): There is moderate osteoarthritis about left hip  I personally reviewed and interpreted the radiographs.   Assessment:   75 year old male with left hip osteoarthritis which has become more symptomatic as he is recovering from his back surgery and being more active.  At today's visit he would like an additional injection as he got significant relief from this.  I have advised that ultimately these injections tend to be less effective over time.  He understands this and would like an additional injection at today's visit as he is having significant pain.  He is about to start a new inhaler medication for his lungs which she believes will help with his breathing.  Plan :    -Left hip ultrasound-guided injection -He will follow-up should he want another injection     Procedure Note  Patient: Tanner Bishop             Date of Birth: 1946/05/22           MRN: [id number]             Visit Date: 10/21/2021  Procedures: Visit Diagnoses:  No diagnosis found.   Large Joint Inj: L hip joint on 10/21/2021 2:33 PM Indications: pain Details: 22 G 3.5 in needle, ultrasound-guided anterolateral approach  Arthrogram:  No  Medications: 4 mL lidocaine 1 %; 80 mg triamcinolone acetonide 40 MG/ML Outcome: tolerated well, no immediate complications Procedure, treatment alternatives, risks and benefits explained, specific risks discussed. Consent was given by the patient. Immediately prior to procedure a time out was called to verify the correct patient, procedure, equipment, support staff and site/side marked as required. Patient was prepped and draped in the usual sterile fashion.       I personally saw and evaluated the patient, and participated in the  management and treatment plan.  Vanetta Mulders, MD Attending Physician, Orthopedic Surgery  This document was dictated using Dragon voice recognition software. A reasonable attempt at proof reading has been made to minimize errors.

## 2021-10-21 NOTE — Telephone Encounter (Addendum)
Received [employer] assist PAP paperwork for provider portion of application. Placed in Dr. Golden Pop mailbox to be signed.  Once received, can be faxed to [place] with prior auth approval letter and insurance card copy (already attached) and copy of [place] Assist forms should be sent to [facility] as well.   Knox Saliva, PharmD, MPH, BCPS Clinical Pharmacist (Rheumatology and Pulmonology)

## 2021-10-22 NOTE — Telephone Encounter (Signed)
Submitted Patient Assistance Application to  [employer]  for TYVASO DPI - provider portion, PA, med list. Patient should have received his patient portion directly from manufacturer   Fax# [phone] Phone# [phone]   Also faxed provider forms to [facility] to file with their Tyvaso DPI referral and as FYI Fax: [phone]   Knox Saliva, PharmD, MPH, BCPS Clinical Pharmacist (Rheumatology and Pulmonology)

## 2021-10-24 ENCOUNTER — Telehealth: Payer: Self-pay

## 2021-10-24 NOTE — Telephone Encounter (Signed)
Patient called asking if he can have a Rx refill patient stated the pain has gotten better but does not want to be without over the weekend  traMADol (ULTRAM) 50 MG tablet

## 2021-10-26 DIAGNOSIS — J8410 Pulmonary fibrosis, unspecified: Secondary | ICD-10-CM | POA: Diagnosis not present

## 2021-10-29 ENCOUNTER — Other Ambulatory Visit: Payer: Self-pay | Admitting: Family Medicine

## 2021-10-29 NOTE — Telephone Encounter (Signed)
Last refill- 06/26/21 Last OV with Dr. Volanda Napoleon- 10/20/21  No future appointment scheduled. Can this patient receive a refill?

## 2021-10-31 NOTE — Telephone Encounter (Signed)
Received a fax from   Tanner Bishop  regarding an approval for TYVASO DPI patient assistance from 10/29/21 to 12/13/22.   Phone number: [phone]  Per [facility] portal, shipment for starter pack has not yet been scheduled. Will continue to f/u  Knox Saliva, PharmD, MPH, BCPS Clinical Pharmacist (Rheumatology and Pulmonology)

## 2021-11-03 NOTE — Telephone Encounter (Signed)
Patient was given a limited supply of tramadol during office visit until he had follow-up with Ortho.

## 2021-11-05 NOTE — Telephone Encounter (Signed)
Patient received shipment of Tyvaso DPI titration kit on 11/04/21. Nursing visit with [facility] for DPI training at home schedulde for 11/14/21  Knox Saliva, PharmD, MPH, BCPS Clinical Pharmacist (Rheumatology and Pulmonology)

## 2021-11-10 ENCOUNTER — Other Ambulatory Visit: Payer: Self-pay | Admitting: Internal Medicine

## 2021-11-11 ENCOUNTER — Encounter: Payer: Self-pay | Admitting: Internal Medicine

## 2021-11-11 ENCOUNTER — Other Ambulatory Visit: Payer: Self-pay | Admitting: *Deleted

## 2021-11-11 ENCOUNTER — Ambulatory Visit: Payer: PPO | Admitting: Internal Medicine

## 2021-11-11 ENCOUNTER — Other Ambulatory Visit: Payer: Self-pay

## 2021-11-11 VITALS — BP 124/70 | HR 77 | Temp 98.2°F | Ht 68.0 in | Wt 165.0 lb

## 2021-11-11 DIAGNOSIS — J9611 Chronic respiratory failure with hypoxia: Secondary | ICD-10-CM

## 2021-11-11 DIAGNOSIS — Z79899 Other long term (current) drug therapy: Secondary | ICD-10-CM

## 2021-11-11 DIAGNOSIS — D649 Anemia, unspecified: Secondary | ICD-10-CM

## 2021-11-11 DIAGNOSIS — J84112 Idiopathic pulmonary fibrosis: Secondary | ICD-10-CM | POA: Diagnosis not present

## 2021-11-11 DIAGNOSIS — R053 Chronic cough: Secondary | ICD-10-CM

## 2021-11-11 DIAGNOSIS — R634 Abnormal weight loss: Secondary | ICD-10-CM | POA: Diagnosis not present

## 2021-11-11 DIAGNOSIS — I2723 Pulmonary hypertension due to lung diseases and hypoxia: Secondary | ICD-10-CM

## 2021-11-11 LAB — CBC WITH DIFFERENTIAL/PLATELET
Basophils Absolute: 0 10*3/uL (ref 0.0–0.1)
Basophils Relative: 0.1 % (ref 0.0–3.0)
Eosinophils Absolute: 0.1 10*3/uL (ref 0.0–0.7)
Eosinophils Relative: 1 % (ref 0.0–5.0)
HCT: 39.9 % (ref 39.0–52.0)
Hemoglobin: 13 g/dL (ref 13.0–17.0)
Lymphocytes Relative: 11.1 % — ABNORMAL LOW (ref 12.0–46.0)
Lymphs Abs: 1.4 10*3/uL (ref 0.7–4.0)
MCHC: 32.5 g/dL (ref 30.0–36.0)
MCV: 103.4 fl — ABNORMAL HIGH (ref 78.0–100.0)
Monocytes Absolute: 1 10*3/uL (ref 0.1–1.0)
Monocytes Relative: 8.1 % (ref 3.0–12.0)
Neutro Abs: 9.8 10*3/uL — ABNORMAL HIGH (ref 1.4–7.7)
Neutrophils Relative %: 79.7 % — ABNORMAL HIGH (ref 43.0–77.0)
Platelets: 295 10*3/uL (ref 150.0–400.0)
RBC: 3.86 Mil/uL — ABNORMAL LOW (ref 4.22–5.81)
RDW: 14.5 % (ref 11.5–15.5)
WBC: 12.3 10*3/uL — ABNORMAL HIGH (ref 4.0–10.5)

## 2021-11-11 LAB — HEPATIC FUNCTION PANEL
ALT: 14 U/L (ref 0–53)
AST: 16 U/L (ref 0–37)
Albumin: 3.7 g/dL (ref 3.5–5.2)
Alkaline Phosphatase: 67 U/L (ref 39–117)
Bilirubin, Direct: 0.1 mg/dL (ref 0.0–0.3)
Total Bilirubin: 0.4 mg/dL (ref 0.2–1.2)
Total Protein: 6.3 g/dL (ref 6.0–8.3)

## 2021-11-11 NOTE — Patient Instructions (Addendum)
ICD-10-CM   1. IPF (idiopathic pulmonary fibrosis) ([place])  J84.112     2. Chronic respiratory failure with hypoxia (HCC)  J96.11     3. Medication management  Z79.899     4. Chronic cough  R05.3     5. WHO group 3 pulmonary arterial hypertension (HCC)  I27.23     6. Anemia, unspecified type  D64.9     7. Weight loss, non-intentional  R63.4       Clinically stable pulmonary fibrosis since last visit.  New diagnosis of WHO group 3 pulmonary hypertension October 2022.Tanner Bishop  Awaiting start with inhaled treprostinil in the next few days.  New onset anemia October 2022   Plan -Check CBC, liver function test today -Continue 2 L of oxygen even at rest and 4 L with exertion -Start inhaled treprostinil in the next few days with pharmacy support - refill opioid cough syrup -Continue prednisone 5 mg daily Baseline -Do spirometry and DLCO in January 2023/February 2023 - MOnitro weight loiss   Follow-up -January/February 2023 but after spirometry and DLCO-30-minute visit

## 2021-11-11 NOTE — Addendum Note (Signed)
Addended by: Lorretta Harp on: 11/11/2021 09:44 AM   Modules accepted: Orders

## 2021-11-11 NOTE — Progress Notes (Signed)
#IPF - uip path 03/22/13  - On OFEV since early May 2015   PFT FVC fev1 ratio BD fev1 TLC DLCO Walk test 19f x 3 laps wt rx             Spring 2014 2.9:L L/% /%        June 2015 2.7L/67% 2.34L/78% 86  3.68/57% 20.24/71%   ofev start  [name]  2016 2.5L/55% 2.1L/60% 84/108%    No desat, Pk HR 130    02/11/2015 Screening visit afferent cough study 2.59L/56% 2.24L/63% 87/112%    Dx with afluttter on ekg   Screen failed due to hx of stones  03/26/2015        No desat, PK HR 130    08/26/15 pft machine 2.73L/68% 2.38L/80% 87   [name]     06/29/2016 Office spiro 2.49L/56% 2.14L/65%     Pk HR 90 and lowest pulse ox 99% ->93%  ofev  11/02/2016  office full pft  2.61L/66% 2.3L/79%    17.23/60%   pfev  09/16/2017  2.53L/64%      Pulse ox 99% -> 93%, HR 81- > 84  ofev  12/22/2017        100% -> 97%,  HR 66 -> 96    09/19/2018        100% -> 93, HR 74 -> 85         OV 03/26/2015  Chief Complaint  Patient presents with   Follow-up    [name], dry cough. CATH procedure on 04/08/15. Denies any chest tightnes/congestion.    75year old male with idiopathic pulmonary fibrosis. He is on Ofev after having failed Esbriet in the past due to side effects. He is tolerating Ofev well. Liver function test in March 2016 was normal. I last saw him in January 2016. Subsequently in February 2016 we screened him for a cough idiopathic pulmonary fibrosis study called afferent but he screen failed due to history of renal stones. At this time he was incidentally diagnosed with new onset atrial flutter. He has seen Dr. GCrissie Sicklesand has been started on anticoagulation with Xarelto and metoprolol. He is due to undergo a ablation. He is not aware of the increased bleeding risk with Ofev in the setting of anticoagulation. He assures me that the anti-coag and is only until he undergoes ablation and after that the plan is to stop it. Therefore only short-term anticoagulation with Xarelto.  Overall his effort tolerance is fine. He has postponed his [facility] lung transplant until he completes his ablation.  Walking desaturation test in the office today he did not desaturate. This is stable. Spirometry not done   Past medical history: Atrial flutter new diagnoses as above   OV 06/29/2016  Chief Complaint  Patient presents with   Follow-up    pt states he is at baseline: sob with exertion, nonprod cough.      75year old male with idiopathic pulmonary fibrosis. He is on Ofev . He completed 6 months of PRAISE study (([id number] placebo) in l;ate winter/early spring 2017. This is SPalestinevisit. Overall stable. No worsening dyspnea and cough. Continues daily exercise is walking many miles. Some days he is sitting more than the others. He is interested in more trials and results of the praise study. These results are not out yet. He is compliant with his Ofe last liver function test was in February 2017. There are no new issues. He believes his atrial fibrillation  is in sinus rhythm;    OV  11/02/2016  Chief Complaint  Patient presents with   Follow-up    4 month IPF follow up and B&A review - does report increased prod cough with yellow mucus, wheezing, tightness in chest, increased SOB, x3-4 weeks with the weather change.  denies any f/c/s, hemoptysis, chest pain   IPF - on ofev. Last liver function test was in July 2017 and normal. Overall dyspnea stable. However he is been having diarrhea with ofev. He called in and we reduced it to 1 tablet a day and this improved the diarrhea. He back on 1 tablet twice a day. The diarrhea has returned although it is much milder. He has never taken any medication for this. Are function test shows slight improvement from July 2017 and similar levels to approximately one year ago  The new issue that he's having significant recurrent sinus infection in the back of chronic postnasal drainage. This since the fall 2017. He says that he and his wife catch  respirator infection and passive back and forth to each other. He is having cough, chest tightness, postnasal drip and yellow sputum. It is not affecting any dyspnea. There is no wheeze. There is no fever or hemoptysis.  OV 03/02/2017  Chief Complaint  Patient presents with   Follow-up    Pt states his breathing is at baseline. Pt c/o dry cough - pt states this is baseline. Pt denies CP/tightness.     Idiopathic pulmonary fibrosis on Ofev. Last seen November 2017. He is a routine follow-up.  He is here with his wife. His sinus infections a result. He is interested in research protocols but we have none right now. He is tolerating his Ofev associated with some mild diarrhea occasional which she takes medication. He is not much of a problem. He tells me that she walks 6 times a week for 5 miles a day. On the treadmill at a 5 incline walking at 3-1/2 miles an hour. For this he feels a little more dyspneic than before. Walking desaturation test 185 feet 3 laps on room air: His pulse ox dropped from 98% at rest and 93% with exertion. His heart rate jumped from 66 at rest and 97 at peak exertion. Features are suggestive of mild progression of the disease. He is not attending pulmonary fibrosis foundation because he felt this was negative but that was years ago. We discussed this and he might be interested again.     OV 09/16/2017  Chief Complaint  Patient presents with   Follow-up    PFT done today. Pt states that his breathing is gradually getting worse. SOB which depends if it is on exertion vs all the time and c/o prod cough with yellow mucus. Denies any CP   S IPF on fev followup. Overall doing well. He recent bronchitis and liked anabiotic and prednisone. He felt the prednisone helped his cough just currently rated as mild to moderate in severity. Helped the shortness of breath. He also helped arthralgia. He is now wondering what taking chronic prednisone. We had an extensive discussion about  this. Spirometry shows that the FVC stable compared to earlier this year but slightly progressive compared to one year ago. [facility] interstitial lung disease questionnaire shows symptoms. His wife is here with him. He is interested in research trials. He has participated in distress before. He is asking about opioid refill for cough if I wont do prednisone.   OV 12/22/2017  Chief Complaint  Patient presents with   Follow-up    Pt states he believes his breathing is at a stable point right now. States that he is coughing which is worse when he first wakes up and then also at night; occ will cough up yellow phlegm.     C.o ofev intolerance with fatigue, weight loss, diarrhea. Does not like lomotil. Wnaant to give it a holiday foir 2-3 weeks. INterested in research protocol - discussed [place]. 5 year cut off since diagnosis coming up 03/22/18. He is overall frustrated ith qualtiy of life. Walks 5 miles; starts feeling dizzy > 3 miles in but not checking pulse ox despite advice. Walking desaturation test on 12/22/2017 185 feet x 3 laps on ROOM AIR:  did noit desaturate. Rest pulse ox was 100%, final pulse ox was 97%. HR response 66/min at rest to 96/min at peak exertion. Patient Juanito Walthers  Did not Desaturate < 88% . Cordaro Kamphaus yes  Desaturated </= 3% points. Slaton Narciso yes did get tachyardic     Walking desaturation test on 02/01/2018 185 feet x 3 laps on ROOM AIR:  did not desaturate. Rest pulse ox was 100%, final pulse ox was 91%. HR response 67/min at rest to 85/min at peak exertion. Patient Nephi Petros  dod not Desaturate < 88% . Aniruddh Rennert yes diod  Desaturated </= 3% points. Kaleth Kaplan did not get tachyardic   OV 02/01/2018  Chief Complaint  Patient presents with   Follow-up    Pt states he has been doing good since last visit. Started back on OFEV 12/24/17 after taking a holiday off of it.     FU IPF  Took ofev holiday. And then restarted 01/24/18 and doing well. [place]  ofev ok. KBILD ok.   OV 03/16/2018'  . Chief Complaint  Patient presents with   Follow-up    PFT done today. States he has been doing well and denies any current complaints. Tolerating OFEV well and has been walking about 5 miles every day with no complaints.   Ms. Gomillion returns for follow-up of idiopathic pulmonary fibrosis.  He is now back on nintedanib after giving it a holiday for a few weeks.  He is tolerating it well and no problems.  He goes for daily walks and has no problems.  His lung function was reviewed today and it shows for the first time a significant decline of greater than 200 cc between 2 time points.  This correlates with him not taking over for a few weeks nevertheless overall he is happy with his health.  He is not interested in research trials anymore.  He has a new question about travel advice encounter.  He plans an [place] from [place], [place] intra[place]junal in [place] for 10 days.  This will start on May 22, 2018.  He wants to make sure it is safe for him to go.  He said he has never been on a cruise and he and his wife have a strong desire to go as part of his goals of care.   OV 05/16/2018  Chief Complaint  Patient presents with   Follow-up    Breathing is unchanged since last OV.    [name] , 75 y.o. , with dob Dec 13, 1946 and male ,Not Hispanic or Latino from [address] [place] [address] - presents to pulm ILD clinic for IPF.  He presents with his wife.  And just under 7 days he is going to embark on a  [place].  This visit is precautionary visit just before that.  Most recent visit was in April 2019.  At this point in time in terms of his IPF he feels stable.  He is compliant with full dose of 5 and is having only mild diarrhea.  Other than that he is tolerating it just fine.  He is looking forward to his cruise.  He had pulmonary function test today that shows an improvement compared to last visit and it is very  similar to summer/fall 2018 in terms of FVC and DLCO.  Although he does feel like he is coming down with an acute bronchitis and wants to take some antibiotic and prednisone I had of the trip.  We also discussed about him having prophylactic antibiotics handy.  He wants a refill on his Tussionex for cough.  At this point in time he is past 5 years of diagnosis and not eligible for research trials he is not interested in transplant although the recent pulmonary fibrosis foundation meeting he did hear from a lot of transplant patients about the individual personal experience.       OV 09/19/2018  Subjective:  Patient ID: [name], male , DOB: [birth year] , age 31 y.o. , MRN: [id number] , ADDRESS: [place] [id number]   09/19/2018 -   Chief Complaint  Patient presents with   Follow-up    Pt saw cards 08/23/18. States he is still having problems with his heart going in and out of rhythm and states he has had some episodes to where he almost passes out. States his breathing is at a stable point. States he also has  an occ cough.     HPI Aeron Plourde 75 y.o. -presents 5-year follow-up.  I personally saw him in June 2019.  Then approximately a month ago he presented and saw acutely my nurse practitioner because of dizziness.  It was felt to be A. fib RVR.  He subsequently saw Dr. Crissie Sickles his electrophysiologist.  I reviewed the note.  Flecainide was started.  He says despite that l approximately 10 weeks ago he had another episode of dizziness while getting out of the shower.  He was tachycardic with a heart rate of 140s.  He felt he might pass out but there is no focal neurologic deficits or abnormal sensation.  His pulse ox was normal at 97% at that time.  It happened at rest.  He says he has been walking on the treadmill for several miles and he does not desaturate.  He says his pulse ox is 93% at that time.  He never drops into the 80s.  He does not think his dizziness and  palpitation issues are related to his pulmonary fibrosis.  However [name] is wondering about oxygen drops during this time.  He is not tolerating his nintedanib at full dose without any problems.  He has had a successful [place] trip.  He is participating in the patient support group.  He is up-to-date with his flu shot      OV 10/13/2018  Subjective:  Patient ID: [name], male , DOB: 04-Oct-1946 , age 64 y.o. , MRN: [id number] , ADDRESS: [place] [id number]   10/13/2018 -   Chief Complaint  Patient presents with   Follow-up    review PFT.  c/o baseline sob with exertion.       HPI Clark Cardamone 75 y.o. -presents for follow-up of IPF to the ILD clinic.  This visit is arranged because letter physiologist Dr. Taylor was concerned about hypoxemia effects on his atrial fibrillation.  At this point in time patient tells me that after increasing dose of flecainide his atrial fibrillation and palpitations have improved and resolved.  Overall he feels stable.  On September 26, 2018 he had 6-minute walk test and this was pretty robust with walking 384 meters and with lowest pulse ox of 95%.  Then on October 05, 2018 he had overnight pulse oximetry that shows 12 minutes of desaturation less than 88%.  He is not interested in oxygen.  Then he called his October 07, 2018 with worsening bronchitis episodes and we gave him 5 days of prednisone and antibiotics.  He finished his last dose yesterday.  Overall he feels stable but pulmonary function test today shows decline in both FVC and DLCO.  And then when I questioned him he felt like he still had some residual bronchiti and feels another course of prednisone could help him.  There are no other new issues.  Last liver function test October 2019 and was normal.      OV 01/12/2019  Subjective:  Patient ID: Miranda Brickell, male , DOB: 11/24/1946 , age 72 y.o. , MRN: [id number] , ADDRESS: [address] [place]  [id number]   01/12/2019 -   Chief Complaint  Patient presents with   Follow-up    PFT performed today. Pt states he is about the same since last visit.States he still becomes SOB with exertion, has a lot of coughing but has been taking mucinex, and also has had some CP or chest tightness.     HPI Tahmir Bonilla 75 y.o. -returns for follow-up of his IPF.  He is now more than 5 years since diagnosis.,  April 2020 he will be 6 years since diagnosis.  He continues to exercise well on the treadmill and according to his wife he is does well.  He is not dropping oxygen at this time.  He does not have a cough when needed works on the tile.  However at other times the day especially when he is trying to lie down to bed or when he gets up in the morning and goes to the bathroom he has really bad cough.  Overall the cough severity is now 6 out of 10 in terms of how it is impacting his quality of life.  He tells me that every time he takes prednisone for the cough he feels better but then when he comes off prednisone cough gets worse.  He said multiple prednisone courses of brief duration for this chronic cough.  In addition for the last few weeks has had yellow sinus drainage and sinus headache and sinus congestion and he feels this is again making his chronic cough worse.  He is open to taking antibiotic and prednisone course.  In terms of taking nintedanib he has no side effects.  His last liver function test reviewed was in October 2019 and it was normal at that time.  Overall at this point in time he really wants good significant support for his cough in terms of affecting his quality of life.    ROS - per HPI     OV 02/16/2019  Subjective:  Patient ID: Mcclain Bowlds, male , DOB: 06/17/1946 , age 72 y.o. , MRN: [id number] , ADDRESS: [address]  [place] [address]   02/16/2019 -   Chief Complaint  Patient presents with   Follow-up    HRCT   and sinus CT performed 2/18. Pt states he is about the same  as last visit and states SOB is about the same. Pt still has a dry cough. Denies any complaints of CP/chest tightness.     HPI Jayse Brayboy 75 y.o. -returns for follow-up of his IPF.  He presents with his wife.  He is here to review the test results.  He had high-resolution CT chest and CT sinus.  These were done in order to reevaluate his disease because he is having significant cough.  His high-resolution CT chest shows only mild progression in his IPF in 3 years.  However there is significant new findings of tracheobronchomalacia although there is no lung cancer.  He also has two-vessel coronary artery calcification.  These are new findings.  Since last visit and a sinus episode he is stable.  He is currently run out of his nasal steroids.  He says the pharmacy said that I declined his prescription request.  I do not recollect such a conversation.  In any event he is now going to participate in the cough study called scenic.  He has received a copy of the consent form.  He is rated.  He has an appointment for his consent visit.  It is possible that the nasal steroid is on the exclusion list but I do not have the exclusion list with me.  Anyway he is not consented.  He is not having chest pain when he does his treadmill exercises.  He works out hard.  He states he gets tachycardic.  He says he has a 6 times that he will die from his heart condition before his pulmonary fibrosis.  He has upcoming appointment with his cardiologist [name] for atrial fibrillation.  ...................................................................... OV 06/08/2019   Subjective:  Patient ID: [name], male , DOB: [birth year] , age 50 y.o. , MRN: [id number] , ADDRESS: [place] [id number]   06/08/2019 -   Chief Complaint  Patient presents with   Follow-up    1wk f/u for IPF. Patient stated that he was given a round of prednisone last week for some SOB but is feeling much better.      HPI Chasen  Stoudt 75 y.o. -returns for IPF follow-up.  Last seen in March 2020.  Since the pandemic started he has been social distancing.  He has not attended any support group.  He has been tolerating his Ofev quite well without any difficulty.  Recently had a bronchitis exacerbation and we gave prednisone.  This resolved his cough.  He was going to participate in a cough study but because of the pandemic the study got canceled.  He tells me that every time he takes prednisone the cough goes away.  He is very interested in taking daily prednisone for symptom relief of cough.  We discussed the side effects of prednisone that include weight gain, easy bruising, adrenal insufficiency, osteoporosis, cataracts hypertension, diabetes, immunosuppression.  Despite all this he wants to take a low-dose prednisone daily.  His last liver function test was in March 2020.  This needs to be retested because he is on nintedanib.  His symptom scores at walk test show relative stability.  His last pulmonary function test was in January 2020 but because of the pandemic is on hold.  We are using symptom and walk test to determine progression.         OV 04/11/2020  Subjective:  Patient ID: [name], male , DOB: 01/23/1946 ,  age 55 y.o. , MRN: [id number] , ADDRESS: [address] [place] [address]   04/11/2020 -   Chief Complaint  Patient presents with   Follow-up    PFT performed today. Pt states he feels like his breathing is gradually becoming worse and states it could be at any time that he is SOB.   #IPF - uip path 03/22/13  - On OFEV since early May 2015 -On chronic daily prednisone because of cough since 2020  HPI Robley Gootee 75 y.o. -presents with his wife IPF follow-up.  Last saw him June 2020.  After that he feels his dyspnea is worse.  He says that he could do several miles in one 1 hour and 30 minutes on the treadmill.  The same distance and now taking 1 hour and 40 minutes.  He feels his disease is  getting worse.  He says the prednisone is working well for him because of improved cough and wellbeing.  He is having some skin bruising because of that.  The nintedanib he is tolerating well except for some mild diarrhea.  On objective symptom score is actually similar to 1 year ago but may be slightly worse than 6 months ago or 9 months ago.  On pulmonary function testing he is stable compared to 1 year ago but slow and steadily he is definitely worse over the many years.  We noticed on his walking desaturation test that he did not mount a tachycardic response as he is done in the past.  He is on flecainide for atrial fibrillation.  He takes his 3 times daily.  He feels his atrial fibrillation is under good control.  There is no chest pain.  There is no weight loss.  Is no loss of physical conditioning.  He is interested in clinical trials.   A month ago he had nausea and his amylase/lipase was elevated.  He says he is fine now.  Primary care address this. ROS - per HPI  IMPRESSION: 1. 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 Findings are consistent with UIP per consensus guidelines: Diagnosis of Idiopathic Pulmonary Fibrosis: An Official ATS/ERS/JRS/ALAT Clinical Practice Guideline. [place], Iss 5, [phone], Aug 14 2017. 2. 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 3. Two-vessel coronary atherosclerosis.   Aortic Atherosclerosis (ICD10-I70.0).     Electronically Signed   By: Ilona Sorrel M.D.   On: 01/31/2019 12:24  OV 06/11/2020  Subjective:  Patient ID: [name], male , DOB: [birth year] , age 62 y.o. , MRN: [id number] , ADDRESS: [place] [id number]   06/11/2020 -   Chief Complaint  Patient presents with   Follow-up    PFT performed today.  Pt states he has been doing good since last visit. States  breathing is about the same.,    #IPF - uip path 03/22/13  - On OFEV since early May 2015 -On chronic daily prednisone because of cough since 2020  HPI Angelina Magwood 75 y.o. -returns to clinic for ILD follow-up.  In the interim he feels that his wife has worsening dementia.  She is able to self-care but only does minimal cooking.  She is more irritable.  His daughter Theadora Rama is now on disability because of back issues.  Patient has a son in [place] who he is close with.  Together they take care of his wife.  He says  he has become the primary caregiver now for his wife.  Nevertheless she is functional and does some ADLs.  He continues with nintedanib 150 mg twice daily.  Overall tolerance is good just mild diarrhea.  This is baseline.  Shortness of breath is baseline symptom score is 14 and similar to the past.  Pulmonary function test appears stable.  Walking desaturation test is stable.  His last liver function test was 2 months ago.  His main issue is that he is having some financial issues covering the cost of nintedanib.  He $6000 grant is running out.  He only has 1 month supply left.  He wants to meet with the pharmacist.      OV 11/12/2020   Subjective:  Patient ID: [name], male , DOB: [birth year], age 4 y.o. years. , MRN: [id number],  ADDRESS: [place] [id number] PCP  [name], MD Providers : Treatment Team:  Attending Provider: Brand Males, MD Patient Care Team: [name], MD as PCP - General Brand Males, MD as Consulting Physician (Pulmonary Disease) [place], Anne Ng, [facility] as Pharmacist (Pharmacist)    Chief Complaint  Patient presents with   Follow-up    IPF, still getting SOB on DOE with some chest tightness       HPI Rodrick Hintz 75 y.o. -IPF follow-up.  Presents with his wife who I am seeing after a long time.  According to the patient patient himself is stable from IPF standpoint.  However he says for the last  several months he has had an exacerbation in his chronic back pain.  He has significant pain.  He has had steroid injections.  The first 1 was successful but the second 1 and subsequent failed.  He is not able to do his walks as he used to in the past.  He has gained some weight.  He is frustrated by this.  He is asking if he can go see a Restaurant manager, fast food.  The other issue with him was elevated amylase.  We never found a cause for this.  So his primary care physician worked him up he had abdominal CT imaging on 08/16/2020 and this was normal.  Otherwise he is okay  He is asking for a refill on his prednisone that he takes for cough.  He also wants Tussionex as needed.  He takes that for cough.  He also says that he got summoned for jury duty.  He is wondering what to do.  I told him under no circumstances he should ever do jury duty.  I worry about the risk   ROS - per HPI  OV 01/24/2021  Subjective:  Patient ID: [name], male , DOB: [birth year] , age 8 y.o. , MRN: [id number] , ADDRESS: [address]   [id number] PCP [name], MD Patient Care Team: [name], MD as PCP - General Brand Males, MD as Consulting Physician (Pulmonary Disease) Desantiago, Anne Ng, [facility] as Pharmacist (Pharmacist)  This Provider for this visit: Treatment Team:  Attending Provider: Brand Males, MD    01/24/2021 -   Chief Complaint  Patient presents with   Acute Visit    Increased SOB since last Friday, heart still feels "funny"   Follow-up idiopathic pulmonary fibrosis On nintedanib and low-dose prednisone for cough Idiopathic elevation in amylase unrelated to nintedanib.  Normal abdominal imaging.  HPI Dakai Milam 75 y.o. -returns for an acute visit.  On Friday, 01/22/2021 he says he got up and he felt  like baseline and then when he walked he suddenly noticed that he was suddenly way more short of breath than usual.  His heart started feeling funny.  He took an acute visit with  cardiology.  I reviewed the notes.  I do not see an EKG but he tells me an EKG was done and he was in sinus rhythm.  He had normal troponin, normal BNP and normal D-dimer.  Chest x-ray was baseline.  I personally visualized this.  Therefore he was sent to pulmonary.  We are seeing him as an acute visit today.  His symptom scores are definitely worse than baseline.  He is tolerating his nintedanib and his weight is stable.  His walking desaturation test shows a sudden decline.  For the first time his pulse ox is below 88%.  His pace of walking was much slower.  He tells me he still feels funny in his chest.  There are no Covid symptoms.  He is fully vaccinated.  There is no edema orthopnea proximal nocturnal dyspnea coughing or wheezing.  There is no sputum or fever production.  His wife is here with him..        CT Chest data  DG Chest 2 View  Result Date: 01/23/2021 CLINICAL DATA:  Shortness of breath cardiac palpitations EXAM: CHEST - 2 VIEW COMPARISON:  12/01/2016 FINDINGS: Cardiac shadow is at the upper limits of normal in size but stable. Lungs are well aerated bilaterally with mild fibrotic scarring stable in appearance from the prior exam. No new focal infiltrate or sizable effusion is seen. No acute bony abnormality is noted. IMPRESSION: Chronic scarring without acute abnormality. Electronically Signed   By: Inez Catalina M.D.   On: 01/23/2021 08:24   CT Chest High Resolution  Result Date: 01/24/2021 CLINICAL DATA:  IPF EXAM: CT CHEST WITHOUT CONTRAST TECHNIQUE: Multidetector CT imaging of the chest was performed following the standard protocol without intravenous contrast. High resolution imaging of the lungs, as well as inspiratory and expiratory imaging, was performed. COMPARISON:  01/31/2019, 03/11/2016, 07/24/2015, 02/14/2013 FINDINGS: Cardiovascular: Aortic atherosclerosis. Cardiomegaly. Scattered left coronary artery calcifications. Enlargement of the main pulmonary artery measuring  up to 3.4 cm in caliber. No pericardial effusion. Mediastinum/Nodes: No enlarged mediastinal, hilar, or axillary lymph nodes. Thyroid gland, trachea, and esophagus demonstrate no significant findings. Lungs/Pleura: Redemonstrated moderate pulmonary fibrosis in a pattern with apical to basal gradient, featuring traction bronchiectasis, irregular peripheral interstitial opacity, septal thickening, subpleural bronchiolectasis, and areas of honeycombing at the lung bases. Fibrotic findings are minimally worsened compared to prior examination dated 01/31/2019 and further slightly worsened over a longer period of follow-up dating back to 02/14/2013. No significant air trapping on expiratory phase imaging. Evidence of prior left lung wedge biopsy. No pleural effusion or pneumothorax. Upper Abdomen: No acute abnormality. Musculoskeletal: No chest wall mass or suspicious bone lesions identified. IMPRESSION: 1. Redemonstrated moderate pulmonary fibrosis in a pattern with apical to basal gradient, featuring traction bronchiectasis, irregular peripheral interstitial opacity, septal thickening, subpleural bronchiolectasis, and areas of honeycombing at the lung bases. Fibrotic findings are minimally worsened compared to prior examination dated 01/31/2019 and further slightly worsened over a longer period of follow-up dating back to 02/14/2013. Findings are in a UIP pattern and in keeping with reported diagnosis of IPF. 2. Cardiomegaly and coronary artery disease. 3. Enlargement of the main pulmonary artery, as can be seen in pulmonary hypertension. Aortic Atherosclerosis (ICD10-I70.0). Electronically Signed   By: Eddie Candle M.D.   On: 01/24/2021 09:16  OV 03/11/2021  Subjective:  Patient ID: [name], male , DOB: [birth year] , age 43 y.o. , MRN: [id number] , ADDRESS: [address] [place] [address] PCP [name], MD Patient Care Team: [name], MD as PCP - General Brand Males, MD  as Consulting Physician (Pulmonary Disease) Viona Gilmore, [facility] as Pharmacist (Pharmacist)  This Provider for this visit: Treatment Team:  Attending Provider: Brand Males, MD    03/11/2021 -   Chief Complaint  Patient presents with   Follow-up    Follow up after PFT.  Pt stated that he has the oxygen but these are the larger tanks.  He is waiting to get the POC from [facility].    Follow-up idiopathic pulmonary fibrosis  - On nintedanib and low-dose prednisone for cough  -Start portable oxygen for progressive disease March 2022  Idiopathic elevation in amylase unrelated to nintedanib.  Normal abdominal imaging.   Normal right heart catheterization 02/21/2021 by Dr. Glori Bickers.    - Mean pressure 17.  Wedge pressure 6.  Cardiac index 2.6.  PVR 2.1  HPI Benjie Noguera 75 y.o. -presents for follow-up since his last visit when we noticed exertional hypoxemia.  Got echocardiogram and then sent him to cardiology.  He had right heart catheterization.  The result is reviewed and is normal.  He is tolerating his nintedanib fine.  He has had problems qualifying for oxygen even though he desaturated.  He says something about insurance did not approve my order.  He is also frustrated with [facility].  He wants to switch to [place].  He is interested in clinical trial but current phase 3 trials are on hold because of logistic issues with sponsor [employer] study. Other studies might be available      OV 06/13/2021  Subjective:  Patient ID: [name], male , DOB: 1946/03/17 , age 4 y.o. , MRN: [id number] , ADDRESS: [address]  [place] [address] PCP [name], MD Patient Care Team: [name], MD as PCP - General Lovena Le Champ Mungo, MD as PCP - Cardiology (Cardiology) Brand Males, MD as Consulting Physician (Pulmonary Disease) Viona Gilmore, [facility] as Pharmacist (Pharmacist)  This Provider for this visit: Treatment Team:  Attending Provider: Brand Males, MD    06/13/2021 -   Chief Complaint  Patient presents with   Follow-up    PFT  performed 6/30 and cxr performed today. Pt states he feels like he is worse since last visit and states he is also coughing more too. Pt did have Covid and since then he has been worse.      HPI Nikan Diffley 75 y.o. -presents for follow-up.  This is unscheduled visit.  He tells me that he is scheduled himself for low back surgery because of worsening sciatica with Dr. Kathyrn Sheriff.  This visit was to be done in the outpatient suite.  When he showed up preoperatively the anesthesiologist rejected surgical fitness because of crackles and because anesthesiologist felt there was pedal edema.  Patient himself denies any pedal edema.  [In fact on exam he does not have pedal edema].  He says he was frustrated with experience because cardiology had cleared him for the procedure.  He also reports that crackles were felt to be due to heart failure although he has pulmonary fibrosis.  In talking to him he did have COVID at the end of May 2022.  After that he is slightly worse.  His FVC shows slight decline but  his DLCO is stable.  Try to do a walk test on him but because of his sciatica he could not walk.  He says he uses oxygen with exercise and mowing yard.  He is really bothered by his pain.  He was wondering about nonsurgical maneuvers to help his pain.  We talked about Tylenol but within limits.  We talked about chiropractor but he says it did not help him.  We talked about dry needling.  He took a recommendation from me or someone that I personally use for dry needling.  However he thinks he will just proceed with surgery.  I discussed Dr. Kathyrn Sheriff and Dr. Kathyrn Sheriff we will plan the surgery in the hospital.  He wants refill of his cough medication.  His subjective symptom score is stable.  His chest x-ray looks stable.  He will have lab blood work today.        OV 08/14/2021  Subjective:  Patient ID: [name],  male , DOB: 12-Jul-1946 , age 73 y.o. , MRN: [id number] , ADDRESS: [place] [address] PCP [name], MD Patient Care Team: [name], MD as PCP - General Lovena Le Champ Mungo, MD as PCP - Cardiology (Cardiology) Brand Males, MD as Consulting Physician (Pulmonary Disease) Viona Gilmore, [facility] as Pharmacist (Pharmacist)  This Provider for this visit: Treatment Team:  Attending Provider: Brand Males, MD  Follow-up idiopathic pulmonary fibrosis  - On nintedanib and low-dose prednisone for cough  -Start portable oxygen for progressive disease March 2022  Idiopathic elevation in amylase unrelated to nintedanib.  Normal abdominal imaging.   Normal right heart catheterization 02/21/2021 by Dr. Glori Bickers.    - Mean pressure 17.  Wedge pressure 6.  Cardiac index 2.6.  PVR 2.1  08/14/2021 -   Chief Complaint  Patient presents with   Follow-up    Pt states that his breathing has become worse since last visit.      HPI Tavien Stockhausen 75 y.o. -returns for follow-up.  Approximately 2 weeks ago on 07/30/2021 he had a spinal surgery.  Surgery went well.  After that the back pain is improved and he sustained improvement.  His effort tolerance then improved but in the last 1 week he is progressively more short of breath.  He says he uses 2 L of nasal cannula oxygen at rest and 3 L with exertion but he states he is unable to do the things he used to even a week ago.  There is no associated worsening edema.  No orthopnea no paroxysmal nocturnal dyspnea.  No chest pain.  No hemoptysis no fever no chills no worsening cough.  He is taking his prednisone and nintedanib correctly.  He says the shortness of breath is so worse that he worries that he is going to die pretty soon.  His weight is stable.  He is unclear if he is desaturating but he did notice that he is having fluctuating heart rate.  Intermittently have significant bradycardia to 40.  His ILD symptom score  shows significant worsening.    CT     OV 09/11/2021  Subjective:  Patient ID: [name], male , DOB: [birth year] , age 67 y.o. , MRN: [id number] , ADDRESS: [address] [place] [address] PCP [name], MD Patient Care Team: [name], MD as PCP - General Lovena Le Champ Mungo, MD as PCP - Cardiology (Cardiology) Brand Males, MD as Consulting Physician (Pulmonary Disease) Viona Gilmore, [facility] as Pharmacist (  Pharmacist)  This Provider for this visit: Treatment Team:  Attending Provider: Brand Males, MD    09/11/2021 -   Chief Complaint  Patient presents with   Follow-up    Pt states his breathing is about the same since last visit.   Follow-up idiopathic pulmonary fibrosis  - On nintedanib and low-dose prednisone for cough  -Start portable oxygen for progressive disease March 2022 - >  hypoxemic at rest sept 2022  Idiopathic elevation in amylase unrelated to nintedanib.  Normal abdominal imaging.   Normal right heart catheterization 02/21/2021 by Dr. Glori Bickers.    - Mean pressure 17.  Wedge pressure 6.  Cardiac index 2.6.  PVR 2.1  Covid may 2022 paxlovid  Spin Surgery - aug 2022  HPI Rober Skeels 75 y.o. -acute visit.  After his last visit he did see cardiology.  Its he appears that his atrial fibrillation is okay.  He then called the support leader Marlane Mingle for the pulmonary fibrosis support group and was complaining about his worsening shortness of breath.  Mr. Hildred Alamin called me and I was asked to see the patient in short order.  But the patient tells me that compared to the last month he is the same.  Nevertheless significant worsening in dyspnea.  In July 2022 before the spine surgery he could walk a mile or more.  Now even with 2 L nasal cannula walking to the mailbox and back is making him very dyspneic.  He is not able to do a lot of activities of daily living easily.  It used to taking 5 minutes to put his clothes on and finish  her shower.  Now is taking 20 minutes.  He seems of lost some weight.  He continues on his nintedanib.  His echocardiogram was unrevealing.  He has had prednisone but this is not helping.        Narrative & Impression  CLINICAL DATA:  75 year old male with history of interstitial lung disease. Follow-up examination.   EXAM: CT CHEST WITHOUT CONTRAST   TECHNIQUE: Multidetector CT imaging of the chest was performed following the standard protocol without intravenous contrast. High resolution imaging of the lungs, as well as inspiratory and expiratory imaging, was performed.   COMPARISON:  Chest CT 01/24/2021.   FINDINGS: Cardiovascular: Heart size is mildly enlarged. There is no significant pericardial fluid, thickening or pericardial calcification. There is aortic atherosclerosis, as well as atherosclerosis of the great vessels of the mediastinum and the coronary arteries, including calcified atherosclerotic plaque in the left main, left anterior descending and left circumflex coronary arteries.   Mediastinum/Nodes: No pathologically enlarged mediastinal or hilar lymph nodes. Please note that accurate exclusion of hilar adenopathy is limited on noncontrast CT scans. Esophagus is unremarkable in appearance. No axillary lymphadenopathy.   Lungs/Pleura: High-resolution images again demonstrate some areas of ground-glass attenuation, septal thickening, thickening of the peribronchovascular interstitium, cylindrical bronchiectasis, peripheral bronchiolectasis and some mild honeycombing. Findings have a definitive craniocaudal gradient and appear mildly progressive compared to the prior examination. Inspiratory and expiratory imaging is unremarkable. No acute consolidative airspace disease. No pleural effusions. No definite suspicious appearing pulmonary nodules or masses are confidently identified upon this background of interstitial lung disease. Some tiny  calcified granulomas are noted in the left lower lobe.   Upper Abdomen: Aortic atherosclerosis.   Musculoskeletal: There are no aggressive appearing lytic or blastic lesions noted in the visualized portions of the skeleton.   IMPRESSION: 1. The appearance of the lungs is compatible with  interstitial lung disease, with a spectrum of findings considered diagnostic of usual interstitial pneumonia (UIP) per current ATS guidelines, as detailed above. Minimal progression of disease compared to the prior study. 2. Aortic atherosclerosis, in addition to left main and 2 vessel coronary artery disease. Please note that although the presence of coronary artery calcium documents the presence of coronary artery disease, the severity of this disease and any potential stenosis cannot be assessed on this non-gated CT examination. Assessment for potential risk factor modification, dietary therapy or pharmacologic therapy may be warranted, if clinically indicated. 3. Mild cardiomegaly.     Electronically Signed   By: Daniel  Entrikin M.D.   On: 08/20/2021 13:48       IMPRESSIONS     1. Left ventricular ejection fraction by 3D volume is 59 %. The left  ventricle has normal function. The left ventricle has no regional wall  motion abnormalities. There is mild left ventricular hypertrophy. Left  ventricular diastolic pa strain is 14.0 %.   2. Right ventricular systolic function is mildly reduced. The right  ventricular size is mildly enlarged. rameters are  consistent with Grade I diastolic dysfunction (impaired relaxation). The  average left ventricular global longitudinalThere is mildly elevated pulmonary  artery systolic pressure.   3. Left atrial size was moderately dilated.   4. The mitral valve is degenerative. Mild to moderate mitral valve  regurgitation. No evidence of mitral stenosis.   5. The aortic valve is normal in structure. Aortic valve regurgitation is  not visualized. No  aortic stenosis is present.   6. The inferior vena cava is normal in size with greater than 50%  respiratory variability, suggesting right atrial pressure of 3 mmHg.   Comparison(s): No significant change from prior study. Prior images  reviewed side by side.     OV 09/24/2021  Subjective:  Patient ID: Mamadou Brandenburg, male , DOB: 02/21/1946 , age 75 y.o. , MRN: [id number] , ADDRESS: [address] [place] [address] PCP Fry, Stephen A, MD Patient Care Team: Fry, Stephen A, MD as PCP - General Taylor, Gregg W, MD as PCP - Cardiology (Cardiology) Aminah Zabawa, MD as Consulting Physician (Pulmonary Disease) Pryor, Madeline G, RPH as Pharmacist (Pharmacist)  This Provider for this visit: Treatment Team:  Attending Provider: Willie Plain, MD    09/24/2021 -   Chief Complaint  Patient presents with   Follow-up    PFT performed today.  Pt states he has been doing okay since last visit. States he may be some better.    Follow-up idiopathic pulmonary fibrosis  - On nintedanib and low-dose prednisone for cough  -Start portable oxygen for progressive disease March 2022 - >  hypoxemic at rest sept 2022  Idiopathic elevation in amylase unrelated to nintedanib.  Normal abdominal imaging.   Normal right heart catheterization 02/21/2021 by Dr. Daniel Bensimhon.    - Mean pressure 17.  Wedge pressure 6.  Cardiac index 2.6.  PVR 2.1  Covid may 2022 paxlovid  HPI Woodward Overbeck 75 y.o. - Seen for follow-up.  This visit is to see his pulmonary function test.  Shows dramatic decline in pulmonary function test FVC decline greater than 10% just in the last 3 months.  DLCO also has declined.  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  He states he is now accepted and come to terms with this decline.  Therefore he feels a little bit better.  His symptom score   is roughly the same.  Last visit I referred him for  repeat right heart catheterization.  He says he still has not heard from them.  He is very interested in pursuing this approach even though the most recent right heart catheterization was in March 2022.  This is because his hypoxemia onset is since then and if there is increased pulmonary artery pressures then you will qualify for standard of care in treprostinil.  We discussed clinical trial as a care option.  Currently IV infusion study based from media [place] is close to enrollment internationally.  He could be eligible for inhaled nitric oxide versus placebo study that looks at improved mobility.  Previously because of his back issues and ongoing schedule issues he declined.  He is also dealing with estrangement with his daughter and his wife having advanced dementia and a sister-in-law passing away.  He could also be potentially eligible for phase 3 inhaled treprostinil in patients with IPF but without pulmonary hypertension.  However he wants to undergo standard of critical care procedures first.  Therefore have written a to Dr. Loralie Champagne and Dr. Glori Bickers about right heart catheterization.  Of note: For the last week he is having some increased cough with yellow sputum.  He is requesting a repeat antibiotic and prednisone burst.  He is on daily prednisone 5 mg for cough.  He is requesting a refill on his chronic opioids which I did.        OV 11/11/2021  Subjective:  Patient ID: [name], male , DOB: September 23, 1946 , age 75 y.o. , MRN: [id number] , ADDRESS: [place] [address] PCP [name], MD Patient Care Team: [name], MD as PCP - General Lovena Le Champ Mungo, MD as PCP - Cardiology (Cardiology) Brand Males, MD as Consulting Physician (Pulmonary Disease) Viona Gilmore, [facility] as Pharmacist (Pharmacist)  This Provider for this visit: Treatment Team:  Attending Provider: Brand Males, MD    11/11/2021 -   Chief Complaint   Patient presents with   Follow-up    Pt recently had right heart cath and states he has received Tyvaso. Pt states he is about the same since last visit.     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  Normal abdominal imaging.  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 -returns for follow-up.  Overall he is feeling stable.  I continues 2 L oxygen at rest 4 L with exertion.  He he is beginning to use motorized wheelchair or scooter for groceries.  But nevertheless overall he is feeling stable.  No  side effects from nintedanib.  Continues have chronic cough for which she is on opioid cough syrup and prednisone.  He wants a refill on his Tussionex.  After his last visit he had right heart catheterization.  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  Inhaled treprostinil has been ordered.  He has the DPI at home.  He is going to start it in the next few days once pharmacy called him and gives him the support.  Review of the labs indicate new onset anemia in October 2022.  He is also losing weight as documented below.  Otherwise feels stable.  The progression that he had after the back surgery  seems to have stopped.  Symptom scores are listed below.       SYMPTOM SCALE - ILD 02/16/2019  06/08/2019  04/11/2020  06/11/2020  11/12/2020 169# weight 01/24/2021 171# 03/11/2021 175# 06/13/2021 174# - covid end emay 2022 08/14/2021 172# 09/11/2021 167# 09/24/2021 169# 11/11/2021 165#  O2 use _0  ra ra Uses o2 with exercise. Mowing yard 2L rest, 3L exertion 2 L rest, 3l exertion 2-4L 2004L  2Shortness of Breath 0 -> 5 scale with 5 being worst (score 6 If unable to do)             2At rest _1 1 1  Simple tasks - showers, clothes change, eating, shaving _2 Household (dishes, doing bed, laundry) _3 Shopping _4 Walking level at own pace _5 Walking up Stairs _6 Total (40 - 48) Dyspnea Score _7 couyg 3 Xx - due to recent pred _8 Did not answer _9 How bad is your fatigue 3 x _10 0 _11 nausea   _12  vomit   _13  diarrhea   _14 0 2 2  anxiety   _15  deopresso   _16       Simple office walk 185 feet x  3 laps goal with forehead probe 09/19/2018  01/12/2019  02/16/2019  06/08/2019  04/11/2020  06/11/2020  11/12/2020  01/24/2021  03/11/2021  08/14/2021  09/11/2021   O2 used Room air Room air Room air Room air  ra ra ra ra 2L [place] Walk RA 86%  Number laps completed _17 of 2 laps only .f   Comments about pace normal normal normal normal  Mod pace avg [ace Slow pace     Resting Pulse Ox/HR 100% and 73/min 100% ad 74/min 100% and 68/min 98% and 82/min 99% and 60/min 98% and 54 98% and 73/min 94% and 88/min 97% RA at rest 97% and HR 85 97% 2L [place] at rest, HR 73  Final Pulse Ox/HR 93% and 85/min  92% and 84/min 93% and 81/min 91% and 96/mni 91% and 74/mm 90% and 71/min 90% and 86/min 87% and 95/min desats to 87% on 2nd lap  93% ad HR 92 aat 1 lap and felt weak 4L [place] did all 3 laps - maintainted > 88%  Desaturated </= 88% no no no no yes yes yes yes     Desaturated <= 3% points Yes, 7 poin Yes, 8 ponts Yes, 7 points Yes, 7 points Yes, 8point Yes, 8 point Yes, 8 points Yes, 7 points     Got Tachycardic >/= 90/min no no no yes         Symptoms at end of test x none none none dyspnea none None dyspnea dyspnea  weak   Miscellaneous comments x x stable stable but tachy for first time     Corrected with 3L [address] - just 1 lap. Not all 3 laps tested on 3L [place] Premature stopping wihtout desats     CT Chest data  No results found.    PFT  PFT Results Latest Ref Rng & Units 09/24/2021 06/12/2021 03/11/2021 06/11/2020 04/11/2020 01/12/2019 10/13/2018  FVC-Pre L 1.86 1.97 2.10 2.26 2.23 2.21 2.25  FVC-Predicted Pre % 48 50 53 57 56 56 57  FVC-Post L - - - - - 2.25 -  FVC-Predicted Post % - - - - - 57 -  Pre FEV1/FVC % % 90 89 86 88 89 87 86  Post FEV1/FCV % % - - - - - 90 -  FEV1-Pre L 1.68 1.74 1.81 2.00 1.99 1.93 1.93  FEV1-Predicted Pre % 60 62 64 70 69 67 67  FEV1-Post L - - - - - 2.03 -  DLCO uncorrected ml/min/mmHg 10.95 13.83 11.41 13.69 14.72 14.38 14.17  DLCO UNC% % 46 59 48 58 62 50 50  DLCO corrected ml/min/mmHg 11.02 13.83 11.51 13.69 14.76 - -  DLCO COR %Predicted % 47 59 48 58 62 - -  DLVA Predicted % 96 114 89 96 110 102 94  TLC L - - - - - - -  TLC % Predicted % - - - - - - -  RV % Predicted % - - - - - - -       has a past medical history of Allergy, Arthritis, Atrial flutter (HCC), Cataract, Dysrhythmia, GERD (gastroesophageal reflux disease), H/O hiatal hernia, History of kidney stones, Hyperlipidemia, Injury of right hand, Pulmonary fibrosis ([place]), Renal stone (06/2014), and Ulcer.   reports that he quit smoking about 42 years ago. His smoking use included cigarettes. He has a 16.00 pack-year smoking history. He has never used smokeless tobacco.  Past Surgical History:  Procedure Laterality Date    ATRIAL FLUTTER ABLATION N/A 04/08/2015   Procedure: ATRIAL FLUTTER ABLATION;  Surgeon: Evans Lance, MD;  Location: [facility] CATH LAB;  Service: Cardiovascular;  Laterality: N/A;   CATARACT EXTRACTION, BILATERAL  2016   COLONOSCOPY  03/01/2017   per Dr. Loletha Carrow, clear, repeat in 5 yrs (brother had colon cancer)    ESOPHAGOGASTRODUODENOSCOPY  2007   EXTRACORPOREAL SHOCK WAVE LITHOTRIPSY  06/17/2014   per Dr. Jeffie Pollock    HAND SURGERY Right    LUMBAR LAMINECTOMY/DECOMPRESSION MICRODISCECTOMY Left 06/26/2021   Procedure: MICRODISCECTOMY LUMBAR FOUR-LUMBAR FIVE;  Surgeon: Consuella Lose, MD;  Location: [place];  Service: Neurosurgery;  Laterality: Left;   LUNG BIOPSY Left 03/22/2013   Procedure: LUNG BIOPSY;  Surgeon: Melrose Nakayama, MD;  Location: [place];  Service: Thoracic;  Laterality: Left;  POLYPECTOMY     RIGHT HEART CATH N/A 02/21/2021   Procedure: RIGHT HEART CATH;  Surgeon: Jolaine Artist, MD;  Location: [place] CV LAB;  Service: Cardiovascular;  Laterality: N/A;   RIGHT HEART CATH N/A 10/03/2021   Procedure: RIGHT HEART CATH;  Surgeon: Jolaine Artist, MD;  Location: [place] CV LAB;  Service: Cardiovascular;  Laterality: N/A;   TONSILLECTOMY     UPPER GASTROINTESTINAL ENDOSCOPY     VIDEO ASSISTED THORACOSCOPY Left 03/22/2013   Procedure: VIDEO ASSISTED THORACOSCOPY;  Surgeon: Melrose Nakayama, MD;  Location: [place];  Service: Thoracic;  Laterality: Left;    No Known Allergies  Immunization History  Administered Date(s) Administered   Fluad Quad(high Dose 65+) 08/25/2019   Influenza Split 09/13/2013   Influenza, High Dose Seasonal PF 10/06/2016, 09/16/2017, 08/17/2018, 09/01/2021   Influenza-Unspecified 09/07/2014, 09/14/2015, 09/13/2020   PFIZER(Purple Top)SARS-COV-2 Vaccination 02/12/2020, 03/12/2020, 08/14/2020   Pfizer Covid-19 Vaccine Bivalent Booster 81yr & up 09/16/2021   Pneumococcal Conjugate-13 02/16/2017   Pneumococcal Polysaccharide-23  05/26/2013   Td 12/15/2007   Tdap 02/23/2018   Zoster Recombinat (Shingrix) 10/13/2018, 07/25/2019   Zoster, Live 02/19/2014    Family History  Problem Relation Age of Onset   Liver cancer Mother    Diabetes Sister    Lung cancer Sister    Heart disease Brother    Diabetes Brother    Colon cancer Brother    Diabetes Other    Cancer Other        prostate   Colon polyps Neg Hx    Esophageal cancer Neg Hx    Rectal cancer Neg Hx    Stomach cancer Neg Hx      Current Outpatient Medications:    chlorpheniramine-HYDROcodone (TUSSIONEX PENNKINETIC ER) 10-8 MG/5ML SUER, Take 5 mLs by mouth 2 (two) times daily as needed for cough (PRN, cough, ipf, chronic cough, palliation)., Disp: 140 mL, Rfl: 0   Cholecalciferol (VITAMIN D) 50 MCG (2000 UT) CAPS, Take 2,000 Units by mouth daily., Disp: , Rfl:    flecainide (TAMBOCOR) 50 MG tablet, Take 1 tablet (50 mg total) by mouth 3 (three) times daily., Disp: 270 tablet, Rfl: 6   fluticasone (FLONASE) 50 MCG/ACT nasal spray, Place 2 sprays into both nostrils daily as needed for allergies. (Patient taking differently: Place 2 sprays into both nostrils 2 (two) times daily as needed for allergies.), Disp: , Rfl:    meloxicam (MOBIC) 15 MG tablet, Take 1 tablet (15 mg total) by mouth daily., Disp: 30 tablet, Rfl: 5   OFEV 150 MG CAPS, TAKE 1 CAPSULE BY MOUTH TWICE DAILY WITH FOOD., Disp: 60 capsule, Rfl: 10   pantoprazole (PROTONIX) 40 MG tablet, TAKE 1 TABLET BY MOUTH TWICE A DAY, Disp: 180 tablet, Rfl: 2   predniSONE (DELTASONE) 5 MG tablet, Take 1 tablet (5 mg total) by mouth daily with breakfast., Disp: , Rfl:    zolpidem (AMBIEN) 5 MG tablet, TAKE 1 TABLET BY MOUTH AT BEDTIME AS NEEDED FOR SLEEP, Disp: 30 tablet, Rfl: 5      Objective:   Vitals:   11/11/21 0857  BP: 124/70  Pulse: 77  Temp: 98.2 F (36.8 C)  TempSrc: Oral  SpO2: 98%  Weight: 165 lb (74.8 kg)  Height: _0  (1.727 m)    Estimated body mass index is 25.09 kg/m as  calculated from the following:   Height as of this encounter: _1  (1.727 m).   Weight as of this encounter: 165 lb (74.8 kg).  @  Luan Pulling  Filed Weights   11/11/21 0857  Weight: 165 lb (74.8 kg)     Physical Exam    General: No distress. Looks well Neuro: Alert and Oriented x 3. GCS 15. Speech normal Psych: Pleasant Resp:  Barrel Chest - no.  Wheeze - no, Crackles - yes bialteraly half way up, No overt respiratory distress CVS: Normal heart sounds. Murmurs - no Ext: Stigmata of Connective Tissue Disease - no HEENT: Normal upper airway. PEERL +. No post nasal drip        Assessment:       ICD-10-CM   1. IPF (idiopathic pulmonary fibrosis) ([place])  J84.112     2. Chronic respiratory failure with hypoxia (HCC)  J96.11     3. Medication management  Z79.899     4. Chronic cough  R05.3     5. WHO group 3 pulmonary arterial hypertension (HCC)  I27.23     6. Anemia, unspecified type  D64.9     7. Weight loss, non-intentional  R63.4          Plan:     Patient Instructions     ICD-10-CM   1. IPF (idiopathic pulmonary fibrosis) ([place])  J84.112     2. Chronic respiratory failure with hypoxia (HCC)  J96.11     3. Medication management  Z79.899     4. Chronic cough  R05.3     5. WHO group 3 pulmonary arterial hypertension (HCC)  I27.23     6. Anemia, unspecified type  D64.9     7. Weight loss, non-intentional  R63.4       Clinically stable pulmonary fibrosis since last visit.  New diagnosis of WHO group 3 pulmonary hypertension October 2022.Marland Kitchen  Awaiting start with inhaled treprostinil in the next few days.  New onset anemia October 2022   Plan -Check CBC, liver function test today -Continue 2 L of oxygen even at rest and 4 L with exertion -Start inhaled treprostinil in the next few days with pharmacy support - refill opioid cough syrup -Continue prednisone 5 mg daily Baseline -Do spirometry and DLCO in January 2023/February 2023 - MOnitro weight  loiss   Follow-up -January/February 2023 but after spirometry and DLCO-30-minute visit     SIGNATURE    Dr. Brand Males, M.D., F.C.C.P,  Pulmonary and Critical Care Medicine Staff Physician, [place] Director - Interstitial Lung Disease  Program  Pulmonary [place] at [place], [place], [address]  Pager: [phone], If no answer or between  15:00h - 7:00h: call [phone] Telephone: [phone]  9:30 AM 11/11/2021

## 2021-11-12 ENCOUNTER — Telehealth: Payer: Self-pay | Admitting: Internal Medicine

## 2021-11-12 MED ORDER — HYDROCOD POLST-CPM POLST ER 10-8 MG/5ML PO SUER: 5.0000 mL | Freq: Two times a day (BID) | ORAL | 0 refills | Status: DC | PRN

## 2021-11-12 NOTE — Telephone Encounter (Signed)
Called and spoke with pt letting him know that MR sent his Tussionex to pharmacy for him and he verbalized understanding. Nothing further needed.

## 2021-11-12 NOTE — Telephone Encounter (Signed)
Rx was pended after OV yesterday 11/29 for MR to send the med to pharmacy for pt but looks like MR never sent Rx in for pt.  Called and spoke with pt letting him know that I would get with MR tomorrow 12/1 to make sure he sends the Rx to pharmacy for pt.  Routing to MR as an FYI that he still needs to take care of sending med in for pt.

## 2021-11-12 NOTE — Telephone Encounter (Signed)
done

## 2021-11-20 ENCOUNTER — Ambulatory Visit (INDEPENDENT_AMBULATORY_CARE_PROVIDER_SITE_OTHER): Payer: PPO

## 2021-11-20 VITALS — Ht 68.0 in | Wt 165.0 lb

## 2021-11-20 DIAGNOSIS — Z0000 Encounter for general adult medical examination without abnormal findings: Secondary | ICD-10-CM

## 2021-11-20 NOTE — Progress Notes (Signed)
Subjective:   Itay Mella is a 75 y.o. male who presents for Medicare Annual/Subsequent preventive examination.  Review of Systems    No ROS Cardiac Risk Factors include: advanced age (>17mn, >>54women);male gender    Objective:    Today's Vitals   11/20/21 1028  Weight: 165 lb (74.8 kg)  Height: _0  (1.727 m)   Body mass index is 25.09 kg/m.  Advanced Directives 11/20/2021 10/03/2021 06/24/2021 02/21/2021 01/22/2021 11/19/2020 02/23/2018  Does Patient Have a Medical Advance Directive? _1  Yes Yes  Type of AParamedicof APalmetto EstatesLiving will HWalkerLiving will HLondonLiving will HStephensLiving will HGilsonLiving will Living will;Healthcare Power of Attorney -  Does patient want to make changes to medical advance directive? No - Patient declined No - Patient declined No - Patient declined No - Patient declined No - Patient declined No - Patient declined -  Copy of HHebronin Chart? No - copy requested No - copy requested - No - copy requested No - copy requested No - copy requested -  Would patient like information on creating a medical advance directive? - - - - - - -  Pre-existing out of facility DNR order (yellow form or pink MOST form) - - - - - - -    Current Medications (verified) Outpatient Encounter Medications as of 11/20/2021  Medication Sig   chlorpheniramine-HYDROcodone (TUSSIONEX PENNKINETIC ER) 10-8 MG/5ML SUER Take 5 mLs by mouth 2 (two) times daily as needed for cough (PRN, cough, ipf, chronic cough, palliation).   Cholecalciferol (VITAMIN D) 50 MCG (2000 UT) CAPS Take 2,000 Units by mouth daily.   flecainide (TAMBOCOR) 50 MG tablet Take 1 tablet (50 mg total) by mouth 3 (three) times daily.   fluticasone (FLONASE) 50 MCG/ACT nasal spray Place 2 sprays into both nostrils daily as needed for allergies. (Patient taking  differently: Place 2 sprays into both nostrils 2 (two) times daily as needed for allergies.)   meloxicam (MOBIC) 15 MG tablet Take 1 tablet (15 mg total) by mouth daily.   OFEV 150 MG CAPS TAKE 1 CAPSULE BY MOUTH TWICE DAILY WITH FOOD.   pantoprazole (PROTONIX) 40 MG tablet TAKE 1 TABLET BY MOUTH TWICE A DAY   predniSONE (DELTASONE) 5 MG tablet Take 1 tablet (5 mg total) by mouth daily with breakfast.   zolpidem (AMBIEN) 5 MG tablet TAKE 1 TABLET BY MOUTH AT BEDTIME AS NEEDED FOR SLEEP   No facility-administered encounter medications on file as of 11/20/2021.    Allergies (verified) Patient has no known allergies.   History: Past Medical History:  Diagnosis Date   Allergy    Arthritis    [place]    a. s/p ablation 03/2015 Dr TLovena Le  Cataract    removed both eyes   Dysrhythmia    GERD (gastroesophageal reflux disease)    H/O hiatal hernia    History of kidney stones    Hyperlipidemia    Injury of right hand    permanent damage after workplace 2009 and 2010 Dr [facility]ang[facility] ReusingPlastic Surgerr   Pulmonary fibrosis ([facility]    sees Dr. ROnnie Graham   Renal stone 06/2014   Ulcer    unresolved   Past Surgical History:  Procedure Laterality Date   ATRIAL FLUTTER ABLATION N/A 04/08/2015   Procedure: ATRIAL FLUTTER ABLATION;  Surgeon: GEvans Lance MD;  [facility]  Service: Cardiovascular;  Laterality: N/A;   BACK SURGERY  06/2021   CATARACT EXTRACTION, BILATERAL  2016   COLONOSCOPY  03/01/2017   per Dr. Loletha Carrow, clear, repeat in 5 yrs (brother had colon cancer)    ESOPHAGOGASTRODUODENOSCOPY  2007   EXTRACORPOREAL SHOCK WAVE LITHOTRIPSY  06/17/2014   per Dr. Jeffie Pollock    HAND SURGERY Right    LUMBAR LAMINECTOMY/DECOMPRESSION MICRODISCECTOMY Left 06/26/2021   Procedure: MICRODISCECTOMY LUMBAR FOUR-LUMBAR FIVE;  Surgeon: Consuella Lose, MD;  Location: [place];  Service: Neurosurgery;  Laterality: Left;   LUNG BIOPSY Left 03/22/2013   Procedure: LUNG BIOPSY;   Surgeon: Melrose Nakayama, MD;  Location: [place];  Service: Thoracic;  Laterality: Left;   POLYPECTOMY     RIGHT HEART CATH N/A 02/21/2021   Procedure: RIGHT HEART CATH;  Surgeon: Jolaine Artist, MD;  Location: [place] CV LAB;  Service: Cardiovascular;  Laterality: N/A;   RIGHT HEART CATH N/A 10/03/2021   Procedure: RIGHT HEART CATH;  Surgeon: Jolaine Artist, MD;  Location: [place] CV LAB;  Service: Cardiovascular;  Laterality: N/A;   TONSILLECTOMY     UPPER GASTROINTESTINAL ENDOSCOPY     VIDEO ASSISTED THORACOSCOPY Left 03/22/2013   Procedure: VIDEO ASSISTED THORACOSCOPY;  Surgeon: Melrose Nakayama, MD;  Location: [place];  Service: Thoracic;  Laterality: Left;   Family History  Problem Relation Age of Onset   Liver cancer Mother    Diabetes Sister    Lung cancer Sister    Heart disease Brother    Diabetes Brother    Colon cancer Brother    Diabetes Other    Cancer Other        prostate   Colon polyps Neg Hx    Esophageal cancer Neg Hx    Rectal cancer Neg Hx    Stomach cancer Neg Hx    Social History   Socioeconomic History   Marital status: Married    Spouse name: Not on file   Number of children: Not on file   Years of education: Not on file   Highest education level: Not on file  Occupational History   Occupation: LABORER    Employer: [employer] engineer   Occupation: Disabled   Tobacco Use   Smoking status: Former    Packs/day: 1.00    Years: 16.00    Pack years: 16.00    Types: Cigarettes    Quit date: 12/14/1978    Years since quitting: 42.9   Smokeless tobacco: Never  Vaping Use   Vaping Use: Never used  Substance and Sexual Activity   Alcohol use: No    Alcohol/week: 0.0 standard drinks   Drug use: No   Sexual activity: Not on file  Other Topics Concern   Not on file  Social History Narrative   Not on file   Social Determinants of Health   Financial Resource Strain: Low Risk    Difficulty of Paying Living Expenses: Not hard  at all  Food Insecurity: No Food Insecurity   Worried About Charity fundraiser in the Last Year: Never true   [place] in the Last Year: Never true  Transportation Needs: No Transportation Needs   Lack of Transportation (Medical): No   Lack of Transportation (Non-Medical): No  Physical Activity: Inactive   Days of Exercise per Week: 0 days   Minutes of Exercise per Session: 0 min  Stress: No Stress Concern Present   Feeling of Stress : Only a little  Social  Connections: Socially Integrated   Frequency of Communication with Friends and Family: More than three times a week   Frequency of Social Gatherings with Friends and Family: More than three times a week   Attends Religious Services: More than 4 times per year   Active Member of [employer] or Organizations: Yes   Attends Music therapist: More than 4 times per year   Marital Status: Married     Clinical Intake:  Diabetic? No  Interpreter Needed?: No  Activities of Daily Living  In your present state of health, do you have any difficulty performing the following activities: 11/20/2021 06/24/2021  Hearing? N N  Vision? N N  Difficulty concentrating or making decisions? N N  Walking or climbing stairs? Tempie Donning  Comment Patient had back surgery July of 2022 -  Dressing or bathing? N N  Doing errands, shopping? N N  Preparing Food and eating ? N -  Using the Toilet? N -  In the past six months, have you accidently leaked urine? N -  Do you have problems with loss of bowel control? N -  Managing your Medications? N -  Managing your Finances? N -  Housekeeping or managing your Housekeeping? N -  Some recent data might be hidden    Patient Care Team: [name], MD as PCP - General Evans Lance, MD as PCP - Cardiology (Cardiology) Brand Males, MD as Consulting Physician (Pulmonary Disease) Viona Gilmore, [facility] as Pharmacist (Pharmacist)  Indicate any recent Medical Services you may have  received from other than [facility] providers in the past year (date may be approximate).     Assessment:   This is a routine wellness examination for [place].  Virtual Visit via Telephone Note  I connected with  Reichen Hutzler on 11/20/21 at 10:30 AM EST by telephone and verified that I am speaking with the correct person using two identifiers.  Location: Patient: Home Provider: Office Persons participating in the virtual visit: patient/Nurse Health Advisor   I discussed the limitations, risks, security and privacy concerns of performing an evaluation and management service by telephone and the availability of in person appointments. The patient expressed understanding and agreed to proceed.  Interactive audio and video telecommunications were attempted between this nurse and patient, however failed, due to patient having technical difficulties OR patient did not have access to video capability.  We continued and completed visit with audio only.  Some vital signs may be absent or patient reported.   Criselda Peaches, LPN   Hearing/Vision screen Hearing Screening - Comments:: No difficulty hearing Vision Screening - Comments:: Wears reading glasses as needed. Followed by [facility].  Dietary issues and exercise activities discussed: Current Exercise Habits: The patient does not participate in regular exercise at present (Patient had back surgery in Julyof 2022), Exercise limited by: Other - see comments;respiratory conditions(s) (Patient had back surgery in July of 2022. Uses oxygen qhs)   Goals Addressed             This Visit's Progress    patient       Looks forward to family vacation.       Depression Screen PHQ 2/9 Scores 11/20/2021 04/03/2021 11/19/2020 02/29/2020 02/28/2019 02/23/2018 02/16/2017  PHQ - 2 Score 0 0 0 0 0 0 0  PHQ- 9 Score - 1 0 - - - -    Fall Risk Fall Risk  11/20/2021 04/03/2021 11/19/2020 02/29/2020 02/28/2019  Falls in the past year?  0 0 0 0 0  Number  falls in past yr: 0 0 0 0 -  Injury with Fall? 0 0 0 0 -  Risk for fall due to : - No Fall Risks - - -  Follow up - - Falls evaluation completed;Falls prevention discussed - -    FALL RISK PREVENTION PERTAINING TO THE HOME:  Any stairs in or around the home? Yes  If so, are there any without handrails? No  Home free of loose throw rugs in walkways, pet beds, electrical cords, etc? Yes  Adequate lighting in your home to reduce risk of falls? Yes   ASSISTIVE DEVICES UTILIZED TO PREVENT FALLS:  Life alert? No  Use of a cane, walker or w/c? No  Grab bars in the bathroom? Yes  Shower chair or bench in shower? No  Elevated toilet seat or a handicapped toilet? No   TIMED UP AND GO:  Was the test performed? No . Audio Visit  Cognitive Function: MMSE - Mini Mental State Exam 02/23/2018 02/16/2017  Not completed: (No Data) (No Data)     6CIT Screen 11/20/2021 11/19/2020  What Year? 0 points 0 points  What month? 0 points 0 points  What time? 0 points 0 points  Count back from 20 0 points 0 points  Months in reverse 0 points 0 points  Repeat phrase 0 points 0 points  Total Score 0 0    Immunizations Immunization History  Administered Date(s) Administered   Fluad Quad(high Dose 65+) 08/25/2019   Influenza Split 09/13/2013   Influenza, High Dose Seasonal PF 10/06/2016, 09/16/2017, 08/17/2018, 09/01/2021   Influenza-Unspecified 09/07/2014, 09/14/2015, 09/13/2020   PFIZER(Purple Top)SARS-COV-2 Vaccination 02/12/2020, 03/12/2020, 08/14/2020   Pfizer Covid-19 Vaccine Bivalent Booster 63yr & up 09/16/2021   Pneumococcal Conjugate-13 02/16/2017   Pneumococcal Polysaccharide-23 05/26/2013   Td 12/15/2007   Tdap 02/23/2018   Zoster Recombinat (Shingrix) 10/13/2018, 07/25/2019   Zoster, Live 02/19/2014    Screening Tests Health Maintenance  Topic Date Due   Hepatitis C Screening  11/20/2022 (Originally 05/27/1964)   COLONOSCOPY (Pts 45-483yrInsurance coverage will need to be  confirmed)  03/01/2022   TETANUS/TDAP  02/24/2028   Pneumonia Vaccine 6523Years old  Completed   INFLUENZA VACCINE  Completed   COVID-19 Vaccine  Completed   Zoster Vaccines- Shingrix  Completed   HPV VACCINES  Aged Out    Health Maintenance  There are no preventive care reminders to display for this patient.   Additional Screening:  Hepatitis C Screening: does qualify; Completed No Patient deferred.  Vision Screening: Recommended annual ophthalmology exams for early detection of glaucoma and other disorders of the eye. Is the patient up to date with their annual eye exam?  Yes  Who is the provider or what is the name of the office in which the patient attends annual eye exams? [facility]: Recommended annual dental exams for proper oral hygiene  Community Resource Referral / Chronic Care Management:  CRR required this visit?  No   CCM required this visit?  No      Plan:     I have personally reviewed and noted the following in the patient's chart:   Medical and social history Use of alcohol, tobacco or illicit drugs  Current medications and supplements including opioid prescriptions. Patient is currently taking opioid prescriptions. Information provided to patient regarding non-opioid alternatives. Patient advised to discuss non-opioid treatment plan with their provider. Functional ability and status Nutritional status Physical activity  Advanced directives List of other physicians Hospitalizations, surgeries, and ER visits in previous 12 months Vitals Screenings to include cognitive, depression, and falls Referrals and appointments  In addition, I have reviewed and discussed with patient certain preventive protocols, quality metrics, and best practice recommendations. A written personalized care plan for preventive services as well as general preventive health recommendations were provided to patient.     Criselda Peaches,  LPN   [phone]

## 2021-11-20 NOTE — Patient Instructions (Addendum)
Tanner Bishop , Thank you for taking time to come for your Medicare Wellness Visit. I appreciate your ongoing commitment to your health goals. Please review the following plan we discussed and let me know if I can assist you in the future.   These are the goals we discussed:  Goals      patient     Looks forward to family vacation.     Patient Stated     Would like to travel some; [place];  To maintain exercise and current lifestyle      Patient Stated     I will continue to walk 5 miles on treadmill daily        This is a list of the screening recommended for you and due dates:  Health Maintenance  Topic Date Due   Hepatitis C Screening: USPSTF Recommendation to screen - Ages 18-79 yo.  11/20/2022*   Colon Cancer Screening  03/01/2022   Tetanus Vaccine  02/24/2028   Pneumonia Vaccine  Completed   Flu Shot  Completed   COVID-19 Vaccine  Completed   Zoster (Shingles) Vaccine  Completed   HPV Vaccine  Aged Out  *Topic was postponed. The date shown is not the original due date.     Advanced directives: Yes  Conditions/risks identified: None  Next appointment: Follow up in one year for your annual wellness visit.   Preventive Care 61 Years and Older, Male Preventive care refers to lifestyle choices and visits with your health care provider that can promote health and wellness. What does preventive care include? A yearly physical exam. This is also called an annual well check. Dental exams once or twice a year. Routine eye exams. Ask your health care provider how often you should have your eyes checked. Personal lifestyle choices, including: Daily care of your teeth and gums. Regular physical activity. Eating a healthy diet. Avoiding tobacco and drug use. Limiting alcohol use. Practicing safe sex. Taking low doses of aspirin every day. Taking vitamin and mineral supplements as recommended by your health care provider. What happens during an annual well check? The  services and screenings done by your health care provider during your annual well check will depend on your age, overall health, lifestyle risk factors, and family history of disease. Counseling  Your health care provider may ask you questions about your: Alcohol use. Tobacco use. Drug use. Emotional well-being. Home and relationship well-being. Sexual activity. Eating habits. History of falls. Memory and ability to understand (cognition). Work and work Statistician. Screening  You may have the following tests or measurements: Height, weight, and BMI. Blood pressure. Lipid and cholesterol levels. These may be checked every 5 years, or more frequently if you are over 65 years old. Skin check. Lung cancer screening. You may have this screening every year starting at age 71 if you have a 30-pack-year history of smoking and currently smoke or have quit within the past 15 years. Fecal occult blood test (FOBT) of the stool. You may have this test every year starting at age 56. Flexible sigmoidoscopy or colonoscopy. You may have a sigmoidoscopy every 5 years or a colonoscopy every 10 years starting at age 67. Prostate cancer screening. Recommendations will vary depending on your family history and other risks. Hepatitis C blood test. Hepatitis B blood test. Sexually transmitted disease (STD) testing. Diabetes screening. This is done by checking your blood sugar (glucose) after you have not eaten for a while (fasting). You may have this done every 1-3 years. Abdominal  aortic aneurysm (AAA) screening. You may need this if you are a current or former smoker. Osteoporosis. You may be screened starting at age 14 if you are at high risk. Talk with your health care provider about your test results, treatment options, and if necessary, the need for more tests. Vaccines  Your health care provider may recommend certain vaccines, such as: Influenza vaccine. This is recommended every year. Tetanus,  diphtheria, and acellular pertussis (Tdap, Td) vaccine. You may need a Td booster every 10 years. Zoster vaccine. You may need this after age 83. Pneumococcal 13-valent conjugate (PCV13) vaccine. One dose is recommended after age 63. Pneumococcal polysaccharide (PPSV23) vaccine. One dose is recommended after age 47. Talk to your health care provider about which screenings and vaccines you need and how often you need them. This information is not intended to replace advice given to you by your health care provider. Make sure you discuss any questions you have with your health care provider. Document Released: 12/27/2015 Document Revised: 08/19/2016 Document Reviewed: 10/01/2015 Elsevier Interactive Patient Education  2017 [place] Prevention in the Home Falls can cause injuries. They can happen to people of all ages. There are many things you can do to make your home safe and to help prevent falls. What can I do on the outside of my home? Regularly fix the edges of walkways and driveways and fix any cracks. Remove anything that might make you trip as you walk through a door, such as a raised step or threshold. Trim any bushes or trees on the path to your home. Use bright outdoor lighting. Clear any walking paths of anything that might make someone trip, such as rocks or tools. Regularly check to see if handrails are loose or broken. Make sure that both sides of any steps have handrails. Any raised decks and porches should have guardrails on the edges. Have any leaves, snow, or ice cleared regularly. Use sand or salt on walking paths during winter. Clean up any spills in your garage right away. This includes oil or grease spills. What can I do in the bathroom? Use night lights. Install grab bars by the toilet and in the tub and shower. Do not use towel bars as grab bars. Use non-skid mats or decals in the tub or shower. If you need to sit down in the shower, use a plastic,  non-slip stool. Keep the floor dry. Clean up any water that spills on the floor as soon as it happens. Remove soap buildup in the tub or shower regularly. Attach bath mats securely with double-sided non-slip rug tape. Do not have throw rugs and other things on the floor that can make you trip. What can I do in the bedroom? Use night lights. Make sure that you have a light by your bed that is easy to reach. Do not use any sheets or blankets that are too big for your bed. They should not hang down onto the floor. Have a firm chair that has side arms. You can use this for support while you get dressed. Do not have throw rugs and other things on the floor that can make you trip. What can I do in the kitchen? Clean up any spills right away. Avoid walking on wet floors. Keep items that you use a lot in easy-to-reach places. If you need to reach something above you, use a strong step stool that has a grab bar. Keep electrical cords out of the way. Do not use  floor polish or wax that makes floors slippery. If you must use wax, use non-skid floor wax. Do not have throw rugs and other things on the floor that can make you trip. What can I do with my stairs? Do not leave any items on the stairs. Make sure that there are handrails on both sides of the stairs and use them. Fix handrails that are broken or loose. Make sure that handrails are as long as the stairways. Check any carpeting to make sure that it is firmly attached to the stairs. Fix any carpet that is loose or worn. Avoid having throw rugs at the top or bottom of the stairs. If you do have throw rugs, attach them to the floor with carpet tape. Make sure that you have a light switch at the top of the stairs and the bottom of the stairs. If you do not have them, ask someone to add them for you. What else can I do to help prevent falls? Wear shoes that: Do not have high heels. Have rubber bottoms. Are comfortable and fit you well. Are closed  at the toe. Do not wear sandals. If you use a stepladder: Make sure that it is fully opened. Do not climb a closed stepladder. Make sure that both sides of the stepladder are locked into place. Ask someone to hold it for you, if possible. Clearly mark and make sure that you can see: Any grab bars or handrails. First and last steps. Where the edge of each step is. Use tools that help you move around (mobility aids) if they are needed. These include: Canes. Walkers. Scooters. Crutches. Turn on the lights when you go into a dark area. Replace any light bulbs as soon as they burn out. Set up your furniture so you have a clear path. Avoid moving your furniture around. If any of your floors are uneven, fix them. If there are any pets around you, be aware of where they are. Review your medicines with your doctor. Some medicines can make you feel dizzy. This can increase your chance of falling. Ask your doctor what other things that you can do to help prevent falls. This information is not intended to replace advice given to you by your health care provider. Make sure you discuss any questions you have with your health care provider. Document Released: 09/26/2009 Document Revised: 05/07/2016 Document Reviewed: 01/04/2015 Elsevier Interactive Patient Education  2017 [place]. Opioid Pain Medicine Management Opioids are powerful medicines that are used to treat moderate to severe pain. When used for short periods of time, they can help you to: Sleep better. Do better in physical or occupational therapy. Feel better in the first few days after an injury. Recover from surgery. Opioids should be taken with the supervision of a trained health care provider. They should be taken for the shortest period of time possible. This is because opioids can be addictive, and the longer you take opioids, the greater your risk of addiction. This addiction can also be called opioid use disorder. What are the  risks? Using opioid pain medicines for longer than 3 days increases your risk of side effects. Side effects include: Constipation. Nausea and vomiting. Breathing difficulties (respiratory depression). Drowsiness. Confusion. Opioid use disorder. Itching. Taking opioid pain medicine for a long period of time can affect your ability to do daily tasks. It also puts you at risk for: Motor vehicle crashes. Depression. Suicide. Heart attack. Overdose, which can be life-threatening. What is a pain treatment  plan? A pain treatment plan is an agreement between you and your health care provider. Pain is unique to each person, and treatments vary depending on your condition. To manage your pain, you and your health care provider need to work together. To help you do this: Discuss the goals of your treatment, including how much pain you might expect to have and how you will manage the pain. Review the risks and benefits of taking opioid medicines. Remember that a good treatment plan uses more than one approach and minimizes the chance of side effects. Be honest about the amount of medicines you take and about any drug or alcohol use. Get pain medicine prescriptions from only one health care provider. Pain can be managed with many types of alternative treatments. Ask your health care provider to refer you to one or more specialists who can help you manage pain through: Physical or occupational therapy. Counseling (cognitive behavioral therapy). Good nutrition. Biofeedback. Massage. Meditation. Non-opioid medicine. Following a gentle exercise program. How to use opioid pain medicine Taking medicine Take your pain medicine exactly as told by your health care provider. Take it only when you need it. If your pain gets less severe, you may take less than your prescribed dose if your health care provider approves. If you are not having pain, do nottake pain medicine unless your health care provider  tells you to take it. If your pain is severe, do nottry to treat it yourself by taking more pills than instructed on your prescription. Contact your health care provider for help. Write down the times when you take your pain medicine. It is easy to become confused while on pain medicine. Writing the time can help you avoid overdose. Take other over-the-counter or prescription medicines only as told by your health care provider. Keeping yourself and others safe  While you are taking opioid pain medicine: Do not drive, use machinery, or power tools. Do not sign legal documents. Do not drink alcohol. Do not take sleeping pills. Do not supervise children by yourself. Do not do activities that require climbing or being in high places. Do not go to a lake, river, ocean, spa, or swimming pool. Do not share your pain medicine with anyone. Keep pain medicine in a locked cabinet or in a secure area where pets and children cannot reach it. Stopping your use of opioids If you have been taking opioid medicine for more than a few weeks, you may need to slowly decrease (taper) how much you take until you stop completely. Tapering your use of opioids can decrease your risk of symptoms of withdrawal, such as: Pain and cramping in the abdomen. Nausea. Sweating. Sleepiness. Restlessness. Uncontrollable shaking (tremors). Cravings for the medicine. Do not attempt to taper your use of opioids on your own. Talk with your health care provider about how to do this. Your health care provider may prescribe a step-down schedule based on how much medicine you are taking and how long you have been taking it. Getting rid of leftover pills Do not save any leftover pills. Get rid of leftover pills safely by: Taking the medicine to a prescription take-back program. This is usually offered by the county or law enforcement. Bringing them to a pharmacy that has a drug disposal container. Flushing them down the toilet.  Check the label or package insert of your medicine to see whether this is safe to do. Throwing them out in the trash. Check the label or package insert of your medicine to  see whether this is safe to do. If it is safe to throw it out, remove the medicine from the original container, put it into a sealable bag or container, and mix it with used coffee grounds, food scraps, dirt, or cat litter before putting it in the trash. Follow these instructions at home: Activity Do exercises as told by your health care provider. Avoid activities that make your pain worse. Return to your normal activities as told by your health care provider. Ask your health care provider what activities are safe for you. General instructions You may need to take these actions to prevent or treat constipation: Drink enough fluid to keep your urine pale yellow. Take over-the-counter or prescription medicines. Eat foods that are high in fiber, such as beans, whole grains, and fresh fruits and vegetables. Limit foods that are high in fat and processed sugars, such as fried or sweet foods. Keep all follow-up visits. This is important. Where to find support If you have been taking opioids for a long time, you may benefit from receiving support for quitting from a local support group or counselor. Ask your health care provider for a referral to these resources in your area. Where to find more information Centers for Disease Control and Prevention (CDC): http://www.wolf.info/ [place] Food and Drug Administration (FDA): [place] Get help right away if: You may have taken too much of an opioid (overdosed). Common symptoms of an overdose: Your breathing is slower or more shallow than normal. You have a very slow heartbeat (pulse). You have slurred speech. You have nausea and vomiting. Your pupils become very small. You have other potential symptoms: You are very confused. You faint or feel like you will faint. You have cold, clammy  skin. You have blue lips or fingernails. You have thoughts of harming yourself or harming others. These symptoms may represent a serious problem that is an emergency. Do not wait to see if the symptoms will go away. Get medical help right away. Call your local emergency services (911 in the [place] Do not drive yourself to the hospital.  If you ever feel like you may hurt yourself or others, or have thoughts about taking your own life, get help right away. Go to your nearest emergency department or: Call your local emergency services (911 in the [place] Call the [facility] 985-107-0556 in the [place] Call a suicide crisis helpline, such as the [facility] at 517 854 3576 or 988 in the [place]. This is open 24 hours a day in the [place] Text the Crisis Text Line at 512-215-5007 (in the [place].). Summary Opioid medicines can help you manage moderate to severe pain for a short period of time. A pain treatment plan is an agreement between you and your health care provider. Discuss the goals of your treatment, including how much pain you might expect to have and how you will manage the pain. If you think that you or someone else may have taken too much of an opioid, get medical help right away. This information is not intended to replace advice given to you by your health care provider. Make sure you discuss any questions you have with your health care provider. Document Revised: 06/25/2021 Document Reviewed: 03/12/2021 Elsevier Patient Education  [place].

## 2021-11-21 ENCOUNTER — Telehealth: Payer: Self-pay | Admitting: Internal Medicine

## 2021-11-21 NOTE — Telephone Encounter (Signed)
Routing to [place] for follow up

## 2021-11-21 NOTE — Telephone Encounter (Signed)
Patient had first nursing visit on 11/12/21 completed. F/u with [name] is scheduled for 01/12/22. Will add Tyvaso to patient's med list  Knox Saliva, PharmD, MPH, BCPS Clinical Pharmacist (Rheumatology and Pulmonology)

## 2021-11-24 NOTE — Telephone Encounter (Signed)
This was signed by MR and has been faxed to [place]. Nothing further needed.

## 2021-11-25 DIAGNOSIS — J8410 Pulmonary fibrosis, unspecified: Secondary | ICD-10-CM | POA: Diagnosis not present

## 2021-12-19 ENCOUNTER — Telehealth: Payer: Self-pay | Admitting: Internal Medicine

## 2021-12-19 ENCOUNTER — Ambulatory Visit: Payer: PPO | Admitting: Internal Medicine

## 2021-12-19 ENCOUNTER — Encounter: Payer: Self-pay | Admitting: Internal Medicine

## 2021-12-19 ENCOUNTER — Other Ambulatory Visit: Payer: Self-pay

## 2021-12-19 VITALS — BP 118/70 | HR 86 | Temp 97.4°F | Ht 68.0 in | Wt 164.6 lb

## 2021-12-19 DIAGNOSIS — Z79899 Other long term (current) drug therapy: Secondary | ICD-10-CM | POA: Diagnosis not present

## 2021-12-19 DIAGNOSIS — J84112 Idiopathic pulmonary fibrosis: Secondary | ICD-10-CM | POA: Diagnosis not present

## 2021-12-19 DIAGNOSIS — J9611 Chronic respiratory failure with hypoxia: Secondary | ICD-10-CM

## 2021-12-19 DIAGNOSIS — I2723 Pulmonary hypertension due to lung diseases and hypoxia: Secondary | ICD-10-CM | POA: Diagnosis not present

## 2021-12-19 DIAGNOSIS — R0609 Other forms of dyspnea: Secondary | ICD-10-CM | POA: Diagnosis not present

## 2021-12-19 DIAGNOSIS — R053 Chronic cough: Secondary | ICD-10-CM | POA: Diagnosis not present

## 2021-12-19 MED ORDER — HYDROCOD POLST-CPM POLST ER 10-8 MG/5ML PO SUER: 5.0000 mL | Freq: Two times a day (BID) | ORAL | 0 refills | Status: DC | PRN

## 2021-12-19 MED ORDER — PREDNISONE 10 MG PO TABS: ORAL_TABLET | ORAL | 0 refills | Status: AC

## 2021-12-19 NOTE — Patient Instructions (Addendum)
ICD-10-CM   1. Chronic respiratory failure with hypoxia (HCC)  J96.11     2. IPF (idiopathic pulmonary fibrosis) ([place])  J84.112     3. 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 Medication management  Z79.899     5. Dyspnea on exertion  R06.09     6. Chronic cough  R05.3       Really concerned that eother tyvaso is making things worse or disease is getting worse. Tolerating ofev ok. More easy desaturations now  Plan -Check CBC, liver function, bmet, bnp 12/19/2021  -Continue 2 L of oxygen even at rest and 4 L with exertion  - keep pulse ox > 88% -at your request will do prednisone burst   - Please take Take prednisone 44m once daily x 3 days, then 316monce daily x 3 days, then 208mnce daily x 3 days, then prednisone 10m1mce daily  x 3 days and then baseline 5mg 25m day -STOP  inhaled treprostinil   - reduce to three times daily x 2 days, then 2 times daily x 2 days and then 1 time daily x 2 days and STOP - refill opioid cough syrup  -- MOnitro weight loss - continue palliative care - if disease getting worse, we have to have some serious conversations about goals of care including hospice consideration -  Follow-up -7-12  days video or telephone visit - either [facility] or APP to discuss next step

## 2021-12-19 NOTE — Progress Notes (Signed)
#IPF - uip path 03/22/13  - On OFEV since early May 2015   PFT FVC fev1 ratio BD fev1 TLC DLCO Walk test 180f x 3 laps wt rx             Spring 2014 2.9:L L/% /%        June 2015 2.7L/67% 2.34L/78% 86  3.68/57% 20.24/71%   ofev start  [name]  2016 2.5L/55% 2.1L/60% 84/108%    No desat, Pk HR 130    02/11/2015 Screening visit afferent cough study 2.59L/56% 2.24L/63% 87/112%    Dx with afluttter on ekg   Screen failed due to hx of stones  03/26/2015        No desat, PK HR 130    08/26/15 pft machine 2.73L/68% 2.38L/80% 87   [name]     06/29/2016 Office spiro 2.49L/56% 2.14L/65%     Pk HR 90 and lowest pulse ox 99% ->93%  ofev  11/02/2016  office full pft  2.61L/66% 2.3L/79%    17.23/60%   pfev  09/16/2017  2.53L/64%      Pulse ox 99% -> 93%, HR 81- > 84  ofev  12/22/2017        100% -> 97%,  HR 66 -> 96    09/19/2018        100% -> 93, HR 74 -> 85         OV 03/26/2015  Chief Complaint  Patient presents with   Follow-up    [name], dry cough. CATH procedure on 04/08/15. Denies any chest tightnes/congestion.    76year old male with idiopathic pulmonary fibrosis. He is on Ofev after having failed Esbriet in the past due to side effects. He is tolerating Ofev well. Liver function test in March 2016 was normal. I last saw him in January 2016. Subsequently in February 2016 we screened him for a cough idiopathic pulmonary fibrosis study called afferent but he screen failed due to history of renal stones. At this time he was incidentally diagnosed with new onset atrial flutter. He has seen Dr. GCrissie Sicklesand has been started on anticoagulation with Xarelto and metoprolol. He is due to undergo a ablation. He is not aware of the increased bleeding risk with Ofev in the setting of anticoagulation. He assures me that the anti-coag and is only until he undergoes ablation and after that the plan is to stop it. Therefore only short-term anticoagulation with Xarelto.  Overall his effort tolerance is fine. He has postponed his [facility] lung transplant until he completes his ablation.  Walking desaturation test in the office today he did not desaturate. This is stable. Spirometry not done   Past medical history: Atrial flutter new diagnoses as above   OV 06/29/2016  Chief Complaint  Patient presents with   Follow-up    pt states he is at baseline: sob with exertion, nonprod cough.      76year old male with idiopathic pulmonary fibrosis. He is on Ofev . He completed 6 months of PRAISE study (([id number] placebo) in l;ate winter/early spring 2017. [place] Overall stable. No worsening dyspnea and cough. Continues daily exercise is walking many miles. Some days he is sitting more than the others. He is interested in more trials and results of the praise study. These results are not out yet. He is compliant with his Ofe last liver function test was in February 2017. There are no new issues. He believes his atrial fibrillation is  in sinus rhythm;    OV  11/02/2016  Chief Complaint  Patient presents with   Follow-up    4 month IPF follow up and B&A review - does report increased prod cough with yellow mucus, wheezing, tightness in chest, increased SOB, x3-4 weeks with the weather change.  denies any f/c/s, hemoptysis, chest pain   IPF - on ofev. Last liver function test was in July 2017 and normal. Overall dyspnea stable. However he is been having diarrhea with ofev. He called in and we reduced it to 1 tablet a day and this improved the diarrhea. He back on 1 tablet twice a day. The diarrhea has returned although it is much milder. He has never taken any medication for this. Are function test shows slight improvement from July 2017 and similar levels to approximately one year ago  The new issue that he's having significant recurrent sinus infection in the back of chronic postnasal drainage. This since the fall 2017. He says that he and his wife catch  respirator infection and passive back and forth to each other. He is having cough, chest tightness, postnasal drip and yellow sputum. It is not affecting any dyspnea. There is no wheeze. There is no fever or hemoptysis.  OV 03/02/2017  Chief Complaint  Patient presents with   Follow-up    Pt states his breathing is at baseline. Pt c/o dry cough - pt states this is baseline. Pt denies CP/tightness.     Idiopathic pulmonary fibrosis on Ofev. Last seen November 2017. He is a routine follow-up.  He is here with his wife. His sinus infections a result. He is interested in research protocols but we have none right now. He is tolerating his Ofev associated with some mild diarrhea occasional which she takes medication. He is not much of a problem. He tells me that she walks 6 times a week for 5 miles a day. On the treadmill at a 5 incline walking at 3-1/2 miles an hour. For this he feels a little more dyspneic than before. Walking desaturation test 185 feet 3 laps on room air: His pulse ox dropped from 98% at rest and 93% with exertion. His heart rate jumped from 66 at rest and 97 at peak exertion. Features are suggestive of mild progression of the disease. He is not attending pulmonary fibrosis foundation because he felt this was negative but that was years ago. We discussed this and he might be interested again.     OV 09/16/2017  Chief Complaint  Patient presents with   Follow-up    PFT done today. Pt states that his breathing is gradually getting worse. SOB which depends if it is on exertion vs all the time and c/o prod cough with yellow mucus. Denies any CP   S IPF on fev followup. Overall doing well. He recent bronchitis and liked anabiotic and prednisone. He felt the prednisone helped his cough just currently rated as mild to moderate in severity. Helped the shortness of breath. He also helped arthralgia. He is now wondering what taking chronic prednisone. We had an extensive discussion about  this. Spirometry shows that the FVC stable compared to earlier this year but slightly progressive compared to one year ago. [facility] interstitial lung disease questionnaire shows symptoms. His wife is here with him. He is interested in research trials. He has participated in distress before. He is asking about opioid refill for cough if I wont do prednisone.   OV 12/22/2017  Chief Complaint  Patient  Patient presents with   Follow-up    Pt states he believes his breathing is at a stable point right now. States that he is coughing which is worse when he first wakes up and then also at night; occ will cough up yellow phlegm.     C.o ofev intolerance with fatigue, weight loss, diarrhea. Does not like lomotil. Wnaant to give it a holiday foir 2-3 weeks. INterested in research protocol - discussed [place]. 5 year cut off since diagnosis coming up 03/22/18. He is overall frustrated ith qualtiy of life. Walks 5 miles; starts feeling dizzy > 3 miles in but not checking pulse ox despite advice. Walking desaturation test on 12/22/2017 185 feet x 3 laps on ROOM AIR:  did noit desaturate. Rest pulse ox was 100%, final pulse ox was 97%. HR response 66/min at rest to 96/min at peak exertion. Patient Tanner Bishop  Did not Desaturate < 88% . Tanner Bishop yes  Desaturated </= 3% points. Tanner Bishop yes did get tachyardic     Walking desaturation test on 02/01/2018 185 feet x 3 laps on ROOM AIR:  did not desaturate. Rest pulse ox was 100%, final pulse ox was 91%. HR response 67/min at rest to 85/min at peak exertion. Patient Tanner Bishop  dod not Desaturate < 88% . Tanner Bishop yes diod  Desaturated </= 3% points. Tanner Bishop did not get tachyardic   OV 02/01/2018  Chief Complaint  Patient presents with   Follow-up    Pt states he has been doing good since last visit. Started back on OFEV 12/24/17 after taking a holiday off of it.     FU IPF  Took ofev holiday. And then restarted 01/24/18 and doing well. [place]  ofev ok. KBILD ok.   OV 03/16/2018'  . Chief Complaint  Patient presents with   Follow-up    PFT done today. States he has been doing well and denies any current complaints. Tolerating OFEV well and has been walking about 5 miles every day with no complaints.   Ms. Carton returns for follow-up of idiopathic pulmonary fibrosis.  He is now back on nintedanib after giving it a holiday for a few weeks.  He is tolerating it well and no problems.  He goes for daily walks and has no problems.  His lung function was reviewed today and it shows for the first time a significant decline of greater than 200 cc between 2 time points.  This correlates with him not taking over for a few weeks nevertheless overall he is happy with his health.  He is not interested in research trials anymore.  He has a new question about travel advice encounter.  He plans an [place] from [place], [place] intra[place]junal in [place] for 10 days.  This will start on May 22, 2018.  He wants to make sure it is safe for him to go.  He said he has never been on a cruise and he and his wife have a strong desire to go as part of his goals of care.   OV 05/16/2018  Chief Complaint  Patient presents with   Follow-up    Breathing is unchanged since last OV.    Tanner Bishop , 76 y.o. , with dob 11/21/1946 and male ,Not Hispanic or Latino from [address] [place] [address] - presents to pulm ILD clinic for IPF.  He presents with his wife.  And just under 7 days he is going to embark on a  [place].  This visit is precautionary visit just before that.  Most recent visit was in April 2019.  At this point in time in terms of his IPF he feels stable.  He is compliant with full dose of 5 and is having only mild diarrhea.  Other than that he is tolerating it just fine.  He is looking forward to his cruise.  He had pulmonary function test today that shows an improvement compared to last visit and it is very  similar to summer/fall 2018 in terms of FVC and DLCO.  Although he does feel like he is coming down with an acute bronchitis and wants to take some antibiotic and prednisone I had of the trip.  We also discussed about him having prophylactic antibiotics handy.  He wants a refill on his Tussionex for cough.  At this point in time he is past 5 years of diagnosis and not eligible for research trials he is not interested in transplant although the recent pulmonary fibrosis foundation meeting he did hear from a lot of transplant patients about the individual personal experience.       OV 09/19/2018  Subjective:  Patient ID: Tanner Bishop, male , DOB: 04/08/1946 , age 89 y.o. , MRN: [id number] , ADDRESS: [place] [address]   09/19/2018 -   Chief Complaint  Patient presents with   Follow-up    Pt saw cards 08/23/18. States he is still having problems with his heart going in and out of rhythm and states he has had some episodes to where he almost passes out. States his breathing is at a stable point. States he also has  an occ cough.     HPI Kirin Pevehouse 75 y.o. -presents 5-year follow-up.  I personally saw him in June 2019.  Then approximately a month ago he presented and saw acutely my nurse practitioner because of dizziness.  It was felt to be A. fib RVR.  He subsequently saw Dr. Crissie Sickles his electrophysiologist.  I reviewed the note.  Flecainide was started.  He says despite that l approximately 10 weeks ago he had another episode of dizziness while getting out of the shower.  He was tachycardic with a heart rate of 140s.  He felt he might pass out but there is no focal neurologic deficits or abnormal sensation.  His pulse ox was normal at 97% at that time.  It happened at rest.  He says he has been walking on the treadmill for several miles and he does not desaturate.  He says his pulse ox is 93% at that time.  He never drops into the 80s.  He does not think his dizziness and  palpitation issues are related to his pulmonary fibrosis.  However [name] is wondering about oxygen drops during this time.  He is not tolerating his nintedanib at full dose without any problems.  He has had a successful [place] trip.  He is participating in the patient support group.  He is up-to-date with his flu shot      OV 10/13/2018  Subjective:  Patient ID: Tanner Bishop, male , DOB: 04-04-1946 , age 68 y.o. , MRN: [id number] , ADDRESS: [place] [id number]   10/13/2018 -   Chief Complaint  Patient presents with   Follow-up    review PFT.  c/o baseline sob with exertion.       HPI Tanner Bishop 76 y.o. -presents for follow-up of IPF to the ILD clinic.  This visit is arranged because letter physiologist [name] was concerned about hypoxemia effects on his atrial fibrillation.  At this point in time patient tells me that after increasing dose of flecainide his atrial fibrillation and palpitations have improved and resolved.  Overall he feels stable.  On September 26, 2018 he had 6-minute walk test and this was pretty robust with walking 384 meters and with lowest pulse ox of 95%.  Then on October 05, 2018 he had overnight pulse oximetry that shows 12 minutes of desaturation less than 88%.  He is not interested in oxygen.  Then he called his October 07, 2018 with worsening bronchitis episodes and we gave him 5 days of prednisone and antibiotics.  He finished his last dose yesterday.  Overall he feels stable but pulmonary function test today shows decline in both FVC and DLCO.  And then when I questioned him he felt like he still had some residual bronchiti and feels another course of prednisone could help him.  There are no other new issues.  Last liver function test October 2019 and was normal.      OV 01/12/2019  Subjective:  Patient ID: Tanner Bishop, male , DOB: Mar 07, 1946 , age 18 y.o. , MRN: [id number] , ADDRESS: [place]  [id number]   01/12/2019 -   Chief Complaint  Patient presents with   Follow-up    PFT performed today. Pt states he is about the same since last visit.States he still becomes SOB with exertion, has a lot of coughing but has been taking mucinex, and also has had some CP or chest tightness.     HPI Tanner Bishop 76 y.o. -returns for follow-up of his IPF.  He is now more than 5 years since diagnosis.,  April 2020 he will be 6 years since diagnosis.  He continues to exercise well on the treadmill and according to his wife he is does well.  He is not dropping oxygen at this time.  He does not have a cough when needed works on the tile.  However at other times the day especially when he is trying to lie down to bed or when he gets up in the morning and goes to the bathroom he has really bad cough.  Overall the cough severity is now 6 out of 10 in terms of how it is impacting his quality of life.  He tells me that every time he takes prednisone for the cough he feels better but then when he comes off prednisone cough gets worse.  He said multiple prednisone courses of brief duration for this chronic cough.  In addition for the last few weeks has had yellow sinus drainage and sinus headache and sinus congestion and he feels this is again making his chronic cough worse.  He is open to taking antibiotic and prednisone course.  In terms of taking nintedanib he has no side effects.  His last liver function test reviewed was in October 2019 and it was normal at that time.  Overall at this point in time he really wants good significant support for his cough in terms of affecting his quality of life.    ROS - per HPI     OV 02/16/2019  Subjective:  Patient ID: Tanner Bishop, male , DOB: Dec 15, 1945 , age 64 y.o. , MRN: [id number] , ADDRESS: [address]  [place] [address]   02/16/2019 -   Chief Complaint  Patient presents with   Follow-up    HRCT  and sinus CT performed 2/18. Pt states he is about the same  as last visit and states SOB is about the same. Pt still has a dry cough. Denies any complaints of CP/chest tightness.     HPI Abdinasir Luczynski 76 y.o. -returns for follow-up of his IPF.  He presents with his wife.  He is here to review the test results.  He had high-resolution CT chest and CT sinus.  These were done in order to reevaluate his disease because he is having significant cough.  His high-resolution CT chest shows only mild progression in his IPF in 3 years.  However there is significant new findings of tracheobronchomalacia although there is no lung cancer.  He also has two-vessel coronary artery calcification.  These are new findings.  Since last visit and a sinus episode he is stable.  He is currently run out of his nasal steroids.  He says the pharmacy said that I declined his prescription request.  I do not recollect such a conversation.  In any event he is now going to participate in the cough study called scenic.  He has received a copy of the consent form.  He is rated.  He has an appointment for his consent visit.  It is possible that the nasal steroid is on the exclusion list but I do not have the exclusion list with me.  Anyway he is not consented.  He is not having chest pain when he does his treadmill exercises.  He works out hard.  He states he gets tachycardic.  He says he has a 6 times that he will die from his heart condition before his pulmonary fibrosis.  He has upcoming appointment with his cardiologist [name] for atrial fibrillation.  ...................................................................... OV 06/08/2019   Subjective:  Patient ID: Tanner Bishop, male , DOB: 09-09-1946 , age 31 y.o. , MRN: [id number] , ADDRESS: [place] [id number]   06/08/2019 -   Chief Complaint  Patient presents with   Follow-up    1wk f/u for IPF. Patient stated that he was given a round of prednisone last week for some SOB but is feeling much better.      HPI Boyce  Eckersley 76 y.o. -returns for IPF follow-up.  Last seen in March 2020.  Since the pandemic started he has been social distancing.  He has not attended any support group.  He has been tolerating his Ofev quite well without any difficulty.  Recently had a bronchitis exacerbation and we gave prednisone.  This resolved his cough.  He was going to participate in a cough study but because of the pandemic the study got canceled.  He tells me that every time he takes prednisone the cough goes away.  He is very interested in taking daily prednisone for symptom relief of cough.  We discussed the side effects of prednisone that include weight gain, easy bruising, adrenal insufficiency, osteoporosis, cataracts hypertension, diabetes, immunosuppression.  Despite all this he wants to take a low-dose prednisone daily.  His last liver function test was in March 2020.  This needs to be retested because he is on nintedanib.  His symptom scores at walk test show relative stability.  His last pulmonary function test was in January 2020 but because of the pandemic is on hold.  We are using symptom and walk test to determine progression.         OV 04/11/2020  Subjective:  Patient ID: Tanner Bishop, male , DOB: 1946/06/21 ,  age 48 y.o. , MRN: [id number] , ADDRESS: [address] [place]  [address]   04/11/2020 -   Chief Complaint  Patient presents with   Follow-up    PFT performed today. Pt states he feels like his breathing is gradually becoming worse and states it could be at any time that he is SOB.   #IPF - uip path 03/22/13  - On OFEV since early May 2015 -On chronic daily prednisone because of cough since 2020  HPI Tanner Bishop 76 y.o. -presents with his wife IPF follow-up.  Last saw him June 2020.  After that he feels his dyspnea is worse.  He says that he could do several miles in one 1 hour and 30 minutes on the treadmill.  The same distance and now taking 1 hour and 40 minutes.  He feels his disease is  getting worse.  He says the prednisone is working well for him because of improved cough and wellbeing.  He is having some skin bruising because of that.  The nintedanib he is tolerating well except for some mild diarrhea.  On objective symptom score is actually similar to 1 year ago but may be slightly worse than 6 months ago or 9 months ago.  On pulmonary function testing he is stable compared to 1 year ago but slow and steadily he is definitely worse over the many years.  We noticed on his walking desaturation test that he did not mount a tachycardic response as he is done in the past.  He is on flecainide for atrial fibrillation.  He takes his 3 times daily.  He feels his atrial fibrillation is under good control.  There is no chest pain.  There is no weight loss.  Is no loss of physical conditioning.  He is interested in clinical trials.   A month ago he had nausea and his amylase/lipase was elevated.  He says he is fine now.  Primary care address this. ROS - per HPI  IMPRESSION: 1. 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 Findings are consistent with UIP per consensus guidelines: Diagnosis of Idiopathic Pulmonary Fibrosis: An Official ATS/ERS/JRS/ALAT Clinical Practice Guideline. [place], Iss 5, [phone], Aug 14 2017. 2. 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 3. Two-vessel coronary atherosclerosis.   Aortic Atherosclerosis (ICD10-I70.0).     Electronically Signed   By: Ilona Sorrel M.D.   On: 01/31/2019 12:24  OV 06/11/2020  Subjective:  Patient ID: Tanner Bishop, male , DOB: 1946-01-15 , age 53 y.o. , MRN: [id number] , ADDRESS: [place] [address]   06/11/2020 -   Chief Complaint  Patient presents with   Follow-up    PFT performed today.  Pt states he has been doing good since last visit. States  breathing is about the same.,    #IPF - uip path 03/22/13  - On OFEV since early May 2015 -On chronic daily prednisone because of cough since 2020  HPI Tanner Bishop 76 y.o. -returns to clinic for ILD follow-up.  In the interim he feels that his wife has worsening dementia.  She is able to self-care but only does minimal cooking.  She is more irritable.  His daughter Theadora Rama is now on disability because of back issues.  Patient has a son in [place] who he is close with.  Together they take care of his wife.  He says  he has become the primary caregiver now for his wife.  Nevertheless she is functional and does some ADLs.  He continues with nintedanib 150 mg twice daily.  Overall tolerance is good just mild diarrhea.  This is baseline.  Shortness of breath is baseline symptom score is 14 and similar to the past.  Pulmonary function test appears stable.  Walking desaturation test is stable.  His last liver function test was 2 months ago.  His main issue is that he is having some financial issues covering the cost of nintedanib.  He $6000 grant is running out.  He only has 1 month supply left.  He wants to meet with the pharmacist.      OV 11/12/2020   Subjective:  Patient ID: Tanner Bishop, male , DOB: 25-Mar-1946, age 44 y.o. years. , MRN: [id number],  ADDRESS: [place] [address] PCP  [name], MD Providers : Treatment Team:  Attending Provider: Brand Males, MD Patient Care Team: [name], MD as PCP - General Brand Males, MD as Consulting Physician (Pulmonary Disease) [place], Anne Ng, [facility] as Pharmacist (Pharmacist)    Chief Complaint  Patient presents with   Follow-up    IPF, still getting SOB on DOE with some chest tightness       HPI Tanner Bishop 76 y.o. -IPF follow-up.  Presents with his wife who I am seeing after a long time.  According to the patient patient himself is stable from IPF standpoint.  However he says for the last  several months he has had an exacerbation in his chronic back pain.  He has significant pain.  He has had steroid injections.  The first 1 was successful but the second 1 and subsequent failed.  He is not able to do his walks as he used to in the past.  He has gained some weight.  He is frustrated by this.  He is asking if he can go see a Restaurant manager, fast food.  The other issue with him was elevated amylase.  We never found a cause for this.  So his primary care physician worked him up he had abdominal CT imaging on 08/16/2020 and this was normal.  Otherwise he is okay  He is asking for a refill on his prednisone that he takes for cough.  He also wants Tussionex as needed.  He takes that for cough.  He also says that he got summoned for jury duty.  He is wondering what to do.  I told him under no circumstances he should ever do jury duty.  I worry about the risk   ROS - per HPI  OV 01/24/2021  Subjective:  Patient ID: Tanner Bishop, male , DOB: 1946-08-29 , age 37 y.o. , MRN: [id number] , ADDRESS: [address] [place] [address] PCP [name], MD Patient Care Team: [name], MD as PCP - General Brand Males, MD as Consulting Physician (Pulmonary Disease) Desantiago, Anne Ng, [facility] as Pharmacist (Pharmacist)  This Provider for this visit: Treatment Team:  Attending Provider: Brand Males, MD    01/24/2021 -   Chief Complaint  Patient presents with   Acute Visit    Increased SOB since last Friday, heart still feels "funny"   Follow-up idiopathic pulmonary fibrosis On nintedanib and low-dose prednisone for cough Idiopathic elevation in amylase unrelated to nintedanib.  Normal abdominal imaging.  HPI Tanner Bishop 76 y.o. -returns for an acute visit.  On Friday, 01/22/2021 he says he got up and he felt  like baseline and then when he walked he suddenly noticed that he was suddenly way more short of breath than usual.  His heart started feeling funny.  He took an acute visit with  cardiology.  I reviewed the notes.  I do not see an EKG but he tells me an EKG was done and he was in sinus rhythm.  He had normal troponin, normal BNP and normal D-dimer.  Chest x-ray was baseline.  I personally visualized this.  Therefore he was sent to pulmonary.  We are seeing him as an acute visit today.  His symptom scores are definitely worse than baseline.  He is tolerating his nintedanib and his weight is stable.  His walking desaturation test shows a sudden decline.  For the first time his pulse ox is below 88%.  His pace of walking was much slower.  He tells me he still feels funny in his chest.  There are no Covid symptoms.  He is fully vaccinated.  There is no edema orthopnea proximal nocturnal dyspnea coughing or wheezing.  There is no sputum or fever production.  His wife is here with him..        CT Chest data  DG Chest 2 View  Result Date: 01/23/2021 CLINICAL DATA:  Shortness of breath cardiac palpitations EXAM: CHEST - 2 VIEW COMPARISON:  12/01/2016 FINDINGS: Cardiac shadow is at the upper limits of normal in size but stable. Lungs are well aerated bilaterally with mild fibrotic scarring stable in appearance from the prior exam. No new focal infiltrate or sizable effusion is seen. No acute bony abnormality is noted. IMPRESSION: Chronic scarring without acute abnormality. Electronically Signed   By: Inez Catalina M.D.   On: 01/23/2021 08:24   CT Chest High Resolution  Result Date: 01/24/2021 CLINICAL DATA:  IPF EXAM: CT CHEST WITHOUT CONTRAST TECHNIQUE: Multidetector CT imaging of the chest was performed following the standard protocol without intravenous contrast. High resolution imaging of the lungs, as well as inspiratory and expiratory imaging, was performed. COMPARISON:  01/31/2019, 03/11/2016, 07/24/2015, 02/14/2013 FINDINGS: Cardiovascular: Aortic atherosclerosis. Cardiomegaly. Scattered left coronary artery calcifications. Enlargement of the main pulmonary artery measuring  up to 3.4 cm in caliber. No pericardial effusion. Mediastinum/Nodes: No enlarged mediastinal, hilar, or axillary lymph nodes. Thyroid gland, trachea, and esophagus demonstrate no significant findings. Lungs/Pleura: Redemonstrated moderate pulmonary fibrosis in a pattern with apical to basal gradient, featuring traction bronchiectasis, irregular peripheral interstitial opacity, septal thickening, subpleural bronchiolectasis, and areas of honeycombing at the lung bases. Fibrotic findings are minimally worsened compared to prior examination dated 01/31/2019 and further slightly worsened over a longer period of follow-up dating back to 02/14/2013. No significant air trapping on expiratory phase imaging. Evidence of prior left lung wedge biopsy. No pleural effusion or pneumothorax. Upper Abdomen: No acute abnormality. Musculoskeletal: No chest wall mass or suspicious bone lesions identified. IMPRESSION: 1. Redemonstrated moderate pulmonary fibrosis in a pattern with apical to basal gradient, featuring traction bronchiectasis, irregular peripheral interstitial opacity, septal thickening, subpleural bronchiolectasis, and areas of honeycombing at the lung bases. Fibrotic findings are minimally worsened compared to prior examination dated 01/31/2019 and further slightly worsened over a longer period of follow-up dating back to 02/14/2013. Findings are in a UIP pattern and in keeping with reported diagnosis of IPF. 2. Cardiomegaly and coronary artery disease. 3. Enlargement of the main pulmonary artery, as can be seen in pulmonary hypertension. Aortic Atherosclerosis (ICD10-I70.0). Electronically Signed   By: Tanner Candle M.D.   On: 01/24/2021 09:16  OV 03/11/2021  Subjective:  Patient ID: Tanner Bishop, male , DOB: 09-Oct-1946 , age 25 y.o. , MRN: [id number] , ADDRESS: [address] [place] [address] PCP [name], MD Patient Care Team: [name], MD as PCP - General Brand Males, MD  as Consulting Physician (Pulmonary Disease) Viona Gilmore, [facility] as Pharmacist (Pharmacist)  This Provider for this visit: Treatment Team:  Attending Provider: Brand Males, MD    03/11/2021 -   Chief Complaint  Patient presents with   Follow-up    Follow up after PFT.  Pt stated that he has the oxygen but these are the larger tanks.  He is waiting to get the POC from [facility].    Follow-up idiopathic pulmonary fibrosis  - On nintedanib and low-dose prednisone for cough  -Start portable oxygen for progressive disease March 2022  Idiopathic elevation in amylase unrelated to nintedanib.  Normal abdominal imaging.   Normal right heart catheterization 02/21/2021 by Dr. Glori Bickers.    - Mean pressure 17.  Wedge pressure 6.  Cardiac index 2.6.  PVR 2.1  HPI Tanner Bishop 76 y.o. -presents for follow-up since his last visit when we noticed exertional hypoxemia.  Got echocardiogram and then sent him to cardiology.  He had right heart catheterization.  The result is reviewed and is normal.  He is tolerating his nintedanib fine.  He has had problems qualifying for oxygen even though he desaturated.  He says something about insurance did not approve my order.  He is also frustrated with [facility].  He wants to switch to [facility].  He is interested in clinical trial but current phase 3 trials are on hold because of logistic issues with sponsor STARSCAPE study. Other studies might be available      OV 06/13/2021  Subjective:  Patient ID: Tanner Bishop, male , DOB: 1946/04/21 , age 84 y.o. , MRN: [id number] , ADDRESS: [address] [place] [address] PCP [name], MD Patient Care Team: [name], MD as PCP - General Lovena Le Champ Mungo, MD as PCP - Cardiology (Cardiology) Brand Males, MD as Consulting Physician (Pulmonary Disease) Viona Gilmore, [facility] as Pharmacist (Pharmacist)  This Provider for this visit: Treatment Team:  Attending Provider: Brand Males, MD    06/13/2021 -   Chief Complaint  Patient presents with   Follow-up    PFT  performed 6/30 and cxr performed today. Pt states he feels like he is worse since last visit and states he is also coughing more too. Pt did have Covid and since then he has been worse.      HPI Tanner Bishop 76 y.o. -presents for follow-up.  This is unscheduled visit.  He tells me that he is scheduled himself for low back surgery because of worsening sciatica with Dr. Kathyrn Sheriff.  This visit was to be done in the outpatient suite.  When he showed up preoperatively the anesthesiologist rejected surgical fitness because of crackles and because anesthesiologist felt there was pedal edema.  Patient himself denies any pedal edema.  [In fact on exam he does not have pedal edema].  He says he was frustrated with experience because cardiology had cleared him for the procedure.  He also reports that crackles were felt to be due to heart failure although he has pulmonary fibrosis.  In talking to him he did have COVID at the end of May 2022.  After that he is slightly worse.  His FVC shows slight decline but  his DLCO is stable.  Try to do a walk test on him but because of his sciatica he could not walk.  He says he uses oxygen with exercise and mowing yard.  He is really bothered by his pain.  He was wondering about nonsurgical maneuvers to help his pain.  We talked about Tylenol but within limits.  We talked about chiropractor but he says it did not help him.  We talked about dry needling.  He took a recommendation from me or someone that I personally use for dry needling.  However he thinks he will just proceed with surgery.  I discussed Dr. Kathyrn Sheriff and Dr. Kathyrn Sheriff we will plan the surgery in the hospital.  He wants refill of his cough medication.  His subjective symptom score is stable.  His chest x-ray looks stable.  He will have lab blood work today.        OV 08/14/2021  Subjective:  Patient ID: Tanner Bishop,  male , DOB: Feb 15, 1946 , age 47 y.o. , MRN: [id number] , ADDRESS: [place] [id number] PCP [name], MD Patient Care Team: [name], MD as PCP - General Lovena Le Champ Mungo, MD as PCP - Cardiology (Cardiology) Brand Males, MD as Consulting Physician (Pulmonary Disease) Viona Gilmore, [facility] as Pharmacist (Pharmacist)  This Provider for this visit: Treatment Team:  Attending Provider: Brand Males, MD  Follow-up idiopathic pulmonary fibrosis  - On nintedanib and low-dose prednisone for cough  -Start portable oxygen for progressive disease March 2022  Idiopathic elevation in amylase unrelated to nintedanib.  Normal abdominal imaging.   Normal right heart catheterization 02/21/2021 by Dr. Glori Bickers.    - Mean pressure 17.  Wedge pressure 6.  Cardiac index 2.6.  PVR 2.1  08/14/2021 -   Chief Complaint  Patient presents with   Follow-up    Pt states that his breathing has become worse since last visit.      HPI Tanner Bishop 76 y.o. -returns for follow-up.  Approximately 2 weeks ago on 07/30/2021 he had a spinal surgery.  Surgery went well.  After that the back pain is improved and he sustained improvement.  His effort tolerance then improved but in the last 1 week he is progressively more short of breath.  He says he uses 2 L of nasal cannula oxygen at rest and 3 L with exertion but he states he is unable to do the things he used to even a week ago.  There is no associated worsening edema.  No orthopnea no paroxysmal nocturnal dyspnea.  No chest pain.  No hemoptysis no fever no chills no worsening cough.  He is taking his prednisone and nintedanib correctly.  He says the shortness of breath is so worse that he worries that he is going to die pretty soon.  His weight is stable.  He is unclear if he is desaturating but he did notice that he is having fluctuating heart rate.  Intermittently have significant bradycardia to 40.  His ILD symptom score  shows significant worsening.    CT     OV 09/11/2021  Subjective:  Patient ID: Tanner Bishop, male , DOB: [birth year] , age 83 y.o. , MRN: [id number] , ADDRESS: [address] [place] [address] PCP [name], MD Patient Care Team: [name], MD as PCP - General Lovena Le Champ Mungo, MD as PCP - Cardiology (Cardiology) Brand Males, MD as Consulting Physician (Pulmonary Disease) Viona Gilmore, [facility] as Pharmacist (  Pharmacist)  This Provider for this visit: Treatment Team:  Attending Provider: Brand Males, MD    09/11/2021 -   Chief Complaint  Patient presents with   Follow-up    Pt states his breathing is about the same since last visit.   Follow-up idiopathic pulmonary fibrosis  - On nintedanib and low-dose prednisone for cough  -Start portable oxygen for progressive disease March 2022 - >  hypoxemic at rest sept 2022  Idiopathic elevation in amylase unrelated to nintedanib.  Normal abdominal imaging.   Normal right heart catheterization 02/21/2021 by Dr. Glori Bickers.    - Mean pressure 17.  Wedge pressure 6.  Cardiac index 2.6.  PVR 2.1  Covid may 2022 paxlovid  Spin Surgery - aug 2022  HPI Tanner Bishop 76 y.o. -acute visit.  After his last visit he did see cardiology.  Its he appears that his atrial fibrillation is okay.  He then called the support leader Marlane Mingle for the pulmonary fibrosis support group and was complaining about his worsening shortness of breath.  Mr. Hildred Alamin called me and I was asked to see the patient in short order.  But the patient tells me that compared to the last month he is the same.  Nevertheless significant worsening in dyspnea.  In July 2022 before the spine surgery he could walk a mile or more.  Now even with 2 L nasal cannula walking to the mailbox and back is making him very dyspneic.  He is not able to do a lot of activities of daily living easily.  It used to taking 5 minutes to put his clothes on and finish  her shower.  Now is taking 20 minutes.  He seems of lost some weight.  He continues on his nintedanib.  His echocardiogram was unrevealing.  He has had prednisone but this is not helping.        Narrative & Impression  CLINICAL DATA:  75 year old male with history of interstitial lung disease. Follow-up examination.   EXAM: CT CHEST WITHOUT CONTRAST   TECHNIQUE: Multidetector CT imaging of the chest was performed following the standard protocol without intravenous contrast. High resolution imaging of the lungs, as well as inspiratory and expiratory imaging, was performed.   COMPARISON:  Chest CT 01/24/2021.   FINDINGS: Cardiovascular: Heart size is mildly enlarged. There is no significant pericardial fluid, thickening or pericardial calcification. There is aortic atherosclerosis, as well as atherosclerosis of the great vessels of the mediastinum and the coronary arteries, including calcified atherosclerotic plaque in the left main, left anterior descending and left circumflex coronary arteries.   Mediastinum/Nodes: No pathologically enlarged mediastinal or hilar lymph nodes. Please note that accurate exclusion of hilar adenopathy is limited on noncontrast CT scans. Esophagus is unremarkable in appearance. No axillary lymphadenopathy.   Lungs/Pleura: High-resolution images again demonstrate some areas of ground-glass attenuation, septal thickening, thickening of the peribronchovascular interstitium, cylindrical bronchiectasis, peripheral bronchiolectasis and some mild honeycombing. Findings have a definitive craniocaudal gradient and appear mildly progressive compared to the prior examination. Inspiratory and expiratory imaging is unremarkable. No acute consolidative airspace disease. No pleural effusions. No definite suspicious appearing pulmonary nodules or masses are confidently identified upon this background of interstitial lung disease. Some tiny  calcified granulomas are noted in the left lower lobe.   Upper Abdomen: Aortic atherosclerosis.   Musculoskeletal: There are no aggressive appearing lytic or blastic lesions noted in the visualized portions of the skeleton.   IMPRESSION: 1. The appearance of the lungs is compatible with  interstitial lung disease, with a spectrum of findings considered diagnostic of usual interstitial pneumonia (UIP) per current ATS guidelines, as detailed above. Minimal progression of disease compared to the prior study. 2. Aortic atherosclerosis, in addition to left main and 2 vessel coronary artery disease. Please note that although the presence of coronary artery calcium documents the presence of coronary artery disease, the severity of this disease and any potential stenosis cannot be assessed on this non-gated CT examination. Assessment for potential risk factor modification, dietary therapy or pharmacologic therapy may be warranted, if clinically indicated. 3. Mild cardiomegaly.     Electronically Signed   By: Vinnie Langton M.D.   On: 08/20/2021 13:48       IMPRESSIONS     1. Left ventricular ejection fraction by 3D volume is 59 %. The left  ventricle has normal function. The left ventricle has no regional wall  motion abnormalities. There is mild left ventricular hypertrophy. Left  ventricular diastolic pa strain is 52.7 %.   2. Right ventricular systolic function is mildly reduced. The right  ventricular size is mildly enlarged. rameters are  consistent with Grade I diastolic dysfunction (impaired relaxation). The  average left ventricular global longitudinalThere is mildly elevated pulmonary  artery systolic pressure.   3. Left atrial size was moderately dilated.   4. The mitral valve is degenerative. Mild to moderate mitral valve  regurgitation. No evidence of mitral stenosis.   5. The aortic valve is normal in structure. Aortic valve regurgitation is  not visualized. No  aortic stenosis is present.   6. The inferior vena cava is normal in size with greater than 50%  respiratory variability, suggesting right atrial pressure of 3 mmHg.   Comparison(s): No significant change from prior study. Prior images  reviewed side by side.     OV 09/24/2021  Subjective:  Patient ID: Tanner Bishop, male , DOB: 1946-05-18 , age 16 y.o. , MRN: [id number] , ADDRESS: [place] [address] PCP [name], MD Patient Care Team: [name], MD as PCP - General Lovena Le Champ Mungo, MD as PCP - Cardiology (Cardiology) Brand Males, MD as Consulting Physician (Pulmonary Disease) Viona Gilmore, [facility] as Pharmacist (Pharmacist)  This Provider for this visit: Treatment Team:  Attending Provider: Brand Males, MD    09/24/2021 -   Chief Complaint  Patient presents with   Follow-up    PFT performed today.  Pt states he has been doing okay since last visit. States he may be some better.    Follow-up idiopathic pulmonary fibrosis  - On nintedanib and low-dose prednisone for cough  -Start portable oxygen for progressive disease March 2022 - >  hypoxemic at rest sept 2022  Idiopathic elevation in amylase unrelated to nintedanib.  Normal abdominal imaging.   Normal right heart catheterization 02/21/2021 by Dr. Glori Bickers.    - Mean pressure 17.  Wedge pressure 6.  Cardiac index 2.6.  PVR 2.1  Covid may 2022 paxlovid  HPI Foxx Gidley 76 y.o. - Seen for follow-up.  This visit is to see his pulmonary function test.  Shows dramatic decline in pulmonary function test FVC decline greater than 10% just in the last 3 months.  DLCO also has declined.  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  He states he is now accepted and come to terms with this decline.  Therefore he feels a little bit better.  His symptom score  is roughly the same.  Last visit I referred him for  repeat right heart catheterization.  He says he still has not heard from them.  He is very interested in pursuing this approach even though the most recent right heart catheterization was in March 2022.  This is because his hypoxemia onset is since then and if there is increased pulmonary artery pressures then you will qualify for standard of care in treprostinil.  We discussed clinical trial as a care option.  Currently IV infusion study based from media [place] is close to enrollment internationally.  He could be eligible for inhaled nitric oxide versus placebo study that looks at improved mobility.  Previously because of his back issues and ongoing schedule issues he declined.  He is also dealing with estrangement with his daughter and his wife having advanced dementia and a sister-in-law passing away.  He could also be potentially eligible for phase 3 inhaled treprostinil in patients with IPF but without pulmonary hypertension.  However he wants to undergo standard of critical care procedures first.  Therefore have written a to Dr. Loralie Champagne and Dr. Glori Bickers about right heart catheterization.  Of note: For the last week he is having some increased cough with yellow sputum.  He is requesting a repeat antibiotic and prednisone burst.  He is on daily prednisone 5 mg for cough.  He is requesting a refill on his chronic opioids which I did.        OV 11/11/2021  Subjective:  Patient ID: Tanner Bishop, male , DOB: 1946/10/21 , age 4 y.o. , MRN: [id number] , ADDRESS: [place] [address] PCP [name], MD Patient Care Team: [name], MD as PCP - General Lovena Le Champ Mungo, MD as PCP - Cardiology (Cardiology) Brand Males, MD as Consulting Physician (Pulmonary Disease) Viona Gilmore, [facility] as Pharmacist (Pharmacist)  This Provider for this visit: Treatment Team:  Attending Provider: Brand Males, MD    11/11/2021 -   Chief Complaint   Patient presents with   Follow-up    Pt recently had right heart cath and states he has received Tyvaso. Pt states he is about the same since last visit.    HPI Dung Bomar 76 y.o. -returns for follow-up.  Overall he is feeling stable.  I continues 2 L oxygen at rest 4 L with exertion.  He he is beginning to use motorized wheelchair or scooter for groceries.  But nevertheless overall he is feeling stable.  No side effects from nintedanib.  Continues have chronic cough for which she is on opioid cough syrup and prednisone.  He wants a refill on his Tussionex.  After his last visit he had right heart catheterization.  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  Inhaled treprostinil has been ordered.  He has the DPI at home.  He is going to start it in the next few days once pharmacy called him and gives him the support.  Review of the labs indicate new onset anemia in October 2022.  He is also losing weight as documented below.  Otherwise feels stable.  The progression that he had after the back surgery seems to have stopped.  Symptom scores are listed below.      OV 12/19/2021  Subjective:  Patient ID: Tanner Bishop, male , DOB: 11-04-1946 , age 64 y.o. 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  Normal abdominal imaging.  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 States even with his O2, he feels like he is having to gasp for breath.     HPI Tanner Bishop 76 y.o. -he is here for his 1 month follow-up after starting inhaled treprostinil.  He is already on nintedanib.  He is tolerating nintedanib fine but now he is on full dose of inhaled treprostinil dry powder inhaler 4 times daily.  He tells me that he is significantly declined.  In fact in September 2022 we could walk the standard 3 laps in our office on 4 L nasal cannula.  Today on 4 L nasal cannula pulsed he could only walk 1 lap and stopped because of significant desaturation.  Although it was above 88% he dropped from 100% to 90% which is a 10 point drop and he stopped it 1 lap.  There is a significant worsening.  He does not know if it is because the disease is getting worse or if this is because of the inhaled treprostinil.  Nevertheless he is thinking 1 hour to take a shower and change his clothes he is extremely exhausted.  Some dizziness.  He feels that he is going to die any moment.  He has home palliative care visiting him.  He is taking care of his wife and this is difficult.  His daughter is now estranged from him and is living in a trailer.  I believe he has a son who is in town and he is in touch with.  He is also having significant amount of cough.  He is on  low-dose prednisone for this.  He wants another burst of prednisone.  He feels this will help him.  I recently refilled his Tussionex for cough but he wants another refill.  We did have a conversation that we are running out of options and his disease might be getting worse.  He understands this.      SYMPTOM SCALE - ILD 02/16/2019  06/08/2019  04/11/2020  06/11/2020  11/12/2020 169# weight 01/24/2021 171# 03/11/2021 175# 06/13/2021 174# - covid end emay 2022 08/14/2021 172# 09/11/2021 167# 09/24/2021 169# 11/11/2021 165# 12/19/2021 164# Tyvaso ofev  O2 use _0   ra ra Uses o2 with exercise. Mowing yard 2L rest, 3L exertion 2 L rest, 3l exertion 2-4L 2-4L 2-4L. Taking 1h to shower   2Shortness of Breath 0 -> 5 scale with 5 being worst (score 6 If unable to do)              2At rest 1 1 0 _0 0  Simple tasks - showers, clothes change, eating, shaving _1 Household (dishes, doing bed, laundry) _2 Shopping _3 Walking level at own pace _4 Walking up Stairs _5 Total (40 - 48) Dyspnea Score _6 couyg 3 Xx - due to recent pred _7 Did not answer _8 How bad is your fatigue 3 x _9 0 _10 nausea   0 0 0 0 0 0 0 0 0 0 1  vomit   0 0 0 0 0 0 0 0 0 0 3  diarrhea   _11 0 _12 anxiety   0 0 0 0 0 0 1 00 0 1 3  deopresso   0 0 0 0 0 0 0 0 0 0 0       Simple office walk 185 feet x  3 laps goal with forehead probe 09/19/2018  01/12/2019  02/16/2019  06/08/2019  04/11/2020  06/11/2020  11/12/2020  01/24/2021  03/11/2021  08/14/2021  09/11/2021    O2 used Room air Room air Room air Room air  ra ra ra ra 2L [place] Walk RA 86% 4L [place] oulse  Number laps completed _13 of 2 laps only .f  Did only 1 lap and stopped  Comments about pace normal normal normal normal  Mod pace avg [ace Slow  pace      Resting Pulse Ox/HR 100% and 73/min 100% ad 74/min 100% and 68/min 98% and 82/min 99% and 60/min 98% and 54 98% and 73/min 94% and 88/min 97% RA at rest 97% and HR 85 97% 2L [place] at rest, HR 73 100%  Final Pulse Ox/HR 93% and 85/min 92% and 84/min 93% and 81/min 91% and 96/mni 91% and 74/mm 90% and 71/min 90% and 86/min 87% and 95/min desats to 87% on 2nd lap 93% ad HR 92 aat 1 lap and felt weak 4L [place] did all 3 laps - maintainted > 88% 90%  Desaturated </= 88% no no no no yes yes yes yes      Desaturated <= 3% points Yes, 7 poin Yes, 8 ponts Yes, 7 points Yes, 7 points Yes, 8point Yes, 8 point Yes, 8 points Yes, 7 points      Got Tachycardic >/= 90/min no no no yes          Symptoms at end of test x none none none dyspnea none None dyspnea dyspnea  weak    Miscellaneous comments x x stable  stable but tachy for first time     Corrected with 3L [place] - just 1 lap. Not all 3 laps tested on 3L [place] Premature stopping wihtout desats  Did only 1 laps- severe dyspnea     CT Chest data  No results found.    PFT  PFT Results Latest Ref Rng & Units 09/24/2021 06/12/2021 03/11/2021 06/11/2020 04/11/2020 01/12/2019 10/13/2018  FVC-Pre L 1.86 1.97 2.10 2.26 2.23 2.21 2.25  FVC-Predicted Pre % 48 50 53 57 56 56 57  FVC-Post L - - - - - 2.25 -  FVC-Predicted Post % - - - - - 57 -  Pre FEV1/FVC % % 90 89 86 88 89 87 86  Post FEV1/FCV % % - - - - - 90 -  FEV1-Pre L 1.68 1.74 1.81 2.00 1.99 1.93 1.93  FEV1-Predicted Pre % 60 62 64 70 69 67 67  FEV1-Post L - - - - - 2.03 -  DLCO uncorrected ml/min/mmHg 10.95 13.83 11.41 13.69 14.72 14.38 14.17  DLCO UNC% % 46 59 48 58 62 50 50  DLCO corrected ml/min/mmHg 11.02 13.83 11.51 13.69 14.76 - -  DLCO COR %Predicted % 47 59 48 58 62 - -  DLVA Predicted % 96 114 89 96 110 102 94  TLC L - - - - - - -  TLC % Predicted % - - - - - - -  RV % Predicted % - - - - - - -       has a past medical history of Allergy, Arthritis, Atrial flutter (HCC), Cataract,  Dysrhythmia, GERD (gastroesophageal reflux disease), H/O hiatal hernia, History of kidney stones, Hyperlipidemia, Injury of right hand, Pulmonary fibrosis ([place]), Renal stone (06/2014), and Ulcer.   reports that he quit smoking about 43 years ago. His smoking use included cigarettes. He has a 16.00 pack-year smoking history. He has never used smokeless tobacco.  Past Surgical History:  Procedure Laterality Date   ATRIAL FLUTTER ABLATION N/A 04/08/2015   Procedure: ATRIAL FLUTTER ABLATION;  Surgeon: Evans Lance, MD;  Location: [facility] CATH LAB;  Service: Cardiovascular;  Laterality: N/A;   BACK SURGERY  06/2021   CATARACT EXTRACTION, BILATERAL  2016   COLONOSCOPY  03/01/2017   per Dr. Loletha Carrow, clear, repeat in 5 yrs (brother had colon cancer)    ESOPHAGOGASTRODUODENOSCOPY  2007   EXTRACORPOREAL SHOCK WAVE LITHOTRIPSY  06/17/2014   per Dr. Jeffie Pollock    HAND SURGERY Right    LUMBAR LAMINECTOMY/DECOMPRESSION MICRODISCECTOMY Left 06/26/2021   Procedure: MICRODISCECTOMY LUMBAR FOUR-LUMBAR FIVE;  Surgeon: Consuella Lose, MD;  Location: [place];  Service: Neurosurgery;  Laterality: Left;   LUNG BIOPSY Left 03/22/2013   Procedure: LUNG BIOPSY;  Surgeon: Melrose Nakayama, MD;  Location: [place];  Service: Thoracic;  Laterality: Left;   POLYPECTOMY     RIGHT HEART CATH N/A 02/21/2021   Procedure: RIGHT HEART CATH;  Surgeon: Jolaine Artist, MD;  Location: [place] CV LAB;  Service: Cardiovascular;  Laterality: N/A;   RIGHT HEART CATH N/A 10/03/2021   Procedure: RIGHT HEART CATH;  Surgeon: Jolaine Artist, MD;  Location: [place] CV LAB;  Service: Cardiovascular;  Laterality: N/A;   TONSILLECTOMY     UPPER GASTROINTESTINAL ENDOSCOPY     VIDEO ASSISTED THORACOSCOPY Left 03/22/2013   Procedure: VIDEO ASSISTED THORACOSCOPY;  Surgeon: Melrose Nakayama, MD;  Location: [place];  Service: Thoracic;  Laterality: Left;    No Known Allergies  Immunization History  Administered Date(s)  Administered   Fluad Quad(high Dose 65+) 08/25/2019   Influenza Split 09/13/2013   Influenza, High Dose Seasonal PF 10/06/2016, 09/16/2017, 08/17/2018, 09/01/2021   Influenza-Unspecified 09/07/2014, 09/14/2015, 09/13/2020   PFIZER(Purple Top)SARS-COV-2 Vaccination 02/12/2020, 03/12/2020, 08/14/2020   Pfizer Covid-19 Vaccine Bivalent Booster 30yr & up 09/16/2021   Pneumococcal Conjugate-13 02/16/2017   Pneumococcal Polysaccharide-23 05/26/2013   Td 12/15/2007   Tdap 02/23/2018   Zoster Recombinat (Shingrix) 10/13/2018, 07/25/2019   Zoster, Live 02/19/2014    Family History  Problem Relation Age of Onset   Liver cancer Mother    Diabetes Sister    Lung cancer Sister    Heart disease Brother    Diabetes Brother    Colon cancer Brother    Diabetes Other    Cancer Other        prostate   Colon polyps Neg Hx    Esophageal cancer Neg Hx    Rectal cancer Neg Hx    Stomach cancer Neg Hx      Current Outpatient Medications:    chlorpheniramine-HYDROcodone (TUSSIONEX PENNKINETIC ER) 10-8 MG/5ML SUER, Take 5 mLs by mouth 2 (two) times daily as needed for cough (PRN, cough, ipf, chronic cough, palliation)., Disp: 140 mL, Rfl: 0   Cholecalciferol (VITAMIN D) 50 MCG (2000 UT) CAPS, Take 2,000 Units by mouth daily., Disp: , Rfl:    flecainide (TAMBOCOR) 50 MG tablet, Take 1 tablet (50 mg total) by mouth 3 (three) times daily., Disp: 270 tablet, Rfl: 6   fluticasone (FLONASE) 50 MCG/ACT nasal spray, Place 2 sprays into both nostrils daily as needed for allergies. (Patient taking differently: Place 2 sprays into both nostrils 2 (two) times daily as needed for allergies.), Disp: , Rfl:    meloxicam (MOBIC) 15 MG tablet, Take 1 tablet (15 mg total) by mouth daily., Disp: 30 tablet, Rfl: 5   OFEV 150 MG CAPS, TAKE 1 CAPSULE BY MOUTH TWICE DAILY WITH FOOD., Disp: 60 capsule, Rfl: 10   pantoprazole (PROTONIX) 40 MG tablet, TAKE 1 TABLET BY MOUTH TWICE A DAY, Disp: 180 tablet, Rfl: 2   predniSONE  (DELTASONE) 5 MG tablet, Take 1 tablet (5 mg total) by mouth daily with breakfast., Disp: , Rfl:    Treprostinil (TYVASO DPI TITRATION KIT) 16 & 32 & 48 MCG POWD, Inhale into the lungs in the morning, at noon, in the evening, and at bedtime., Disp: , Rfl:    zolpidem (AMBIEN) 5 MG tablet, TAKE 1 TABLET BY MOUTH AT BEDTIME AS NEEDED FOR SLEEP, Disp: 30 tablet, Rfl: 5      Objective:   Vitals:   12/19/21 1517  BP: 118/70  Pulse: 86  Temp: (!) 97.4 F (36.3 C)  TempSrc: Oral  SpO2: 96%  Weight: 164 lb 9.6 oz (74.7 kg)  Height: 5' 8" (1.727 m)    Estimated body mass index is 25.03 kg/m as calculated from the following:   Height as of this encounter: 5' 8" (1.727 m).   Weight as of this encounter: 164 lb 9.6 oz (74.7 kg).  _0 @  Filed Weights   12/19/21 1517  Weight: 164 lb 9.6 oz (74.7 kg)     Physical Exam  General: No distress. Looks same but gtes dyspneic on standing for exam Neuro: Alert and Oriented x 3. GCS 15. Speech normal Psych: Pleasant Resp:  Barrel Chest - no.  Wheeze - no, Crackles - yes, No overt respiratory distress CVS: Normal heart sounds. Murmurs - no Ext: Stigmata of Connective Tissue Disease - no HEENT:  Normal upper airway. PEERL +. No post nasal drip        Assessment:       ICD-10-CM   1. Chronic respiratory failure with hypoxia (HCC)  J96.11     2. IPF (idiopathic pulmonary fibrosis) ([place])  J84.112     3. 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 Medication management  Z79.899     5. Dyspnea on exertion  R06.09     6. Chronic cough  R05.3          Plan:     Patient Instructions     ICD-10-CM   1. Chronic respiratory failure with hypoxia (HCC)  J96.11     2. IPF (idiopathic pulmonary fibrosis) ([place])  J84.112     3. 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 Medication management  Z79.899     5. Dyspnea on exertion  R06.09     6. Chronic cough  R05.3       Really  concerned that eother tyvaso is making things worse or disease is getting worse. Tolerating ofev ok. More easy desaturations now  Plan -Check CBC, liver function, bmet, bnp 12/19/2021  -Continue 2 L of oxygen even at rest and 4 L with exertion  - keep pulse ox > 88% -at your request will do prednisone burst   - Please take Take prednisone 60m once daily x 3 days, then 316monce daily x 3 days, then 2062mnce daily x 3 days, then prednisone 99m56mce daily  x 3 days and then baseline 5mg 7m day -STOP  inhaled treprostinil   - reduce to three times daily x 2 days, then 2 times daily x 2 days and then 1 time daily x 2 days and STOP - refill opioid cough syrup  -- MOnitro weight loss - continue palliative care - if disease getting worse, we have to have some serious conversations about goals of care including hospice consideration -  Follow-up -7-12  days video or telephone visit - either Loyce Flaming or APP to discuss next step   ( Level 05 visit: Estb 40-54 min  in  visit type: on-site physical face to visit  in total care time and counseling or/and coordination of care by this undersigned MD - Dr MuralBrand Maless includes one or more of the following on this same day 12/19/2021: pre-charting, chart review, note writing, documentation discussion of test results, diagnostic or treatment recommendations, prognosis, risks and benefits of management options, instructions, education, compliance or risk-factor reduction. It excludes time spent by the CMA [facility]ice staff in the care of the patient. Actual time 50 min)  SIGNATURE    Dr. MuralBrand Males., F.C.C.P,  Pulmonary and Critical Care Medicine Staff Physician, [facility] - Interstitial Lung Disease  Program  Pulmonary [id number] Number:  NPI #[id number]: [phone] answer  -> Check AMION or Try [phone] Telephone (clinical office): [phone] Telephone (research): [phone]  6:29 PM 12/19/2021

## 2021-12-19 NOTE — Telephone Encounter (Signed)
Triage/Emily  Plese call [facility]. LEt them know I am concerned patient Is declining. Taking 1h to do shower and has so much dyspnea. Like them to address his symptoms and goals and code status  Thanks    SIGNATURE    Dr. Brand Males, M.D., F.C.C.P,  Pulmonary and Critical Care Medicine Staff Physician, [place] Director - Interstitial Lung Disease  Program  Pulmonary [place] [facility] [place], [place], [address]  NPI Number:  NPI #[id number]  Pager: [phone], If no answer  -> Check AMION or Try [phone] Telephone (clinical office): [phone] Telephone (research): [phone]  6:34 PM 12/19/2021

## 2021-12-20 LAB — CBC WITH DIFFERENTIAL/PLATELET
Absolute Monocytes: 890 cells/uL (ref 200–950)
Basophils Absolute: 74 cells/uL (ref 0–200)
Basophils Relative: 0.7 %
Eosinophils Absolute: 85 cells/uL (ref 15–500)
Eosinophils Relative: 0.8 %
HCT: 39.1 % (ref 38.5–50.0)
Hemoglobin: 13.5 g/dL (ref 13.2–17.1)
Lymphs Abs: 1654 cells/uL (ref 850–3900)
MCH: 34.4 pg — ABNORMAL HIGH (ref 27.0–33.0)
MCHC: 34.5 g/dL (ref 32.0–36.0)
MCV: 99.5 fL (ref 80.0–100.0)
MPV: 10.1 fL (ref 7.5–12.5)
Monocytes Relative: 8.4 %
Neutro Abs: 7897 cells/uL — ABNORMAL HIGH (ref 1500–7800)
Neutrophils Relative %: 74.5 %
Platelets: 419 10*3/uL — ABNORMAL HIGH (ref 140–400)
RBC: 3.93 10*6/uL — ABNORMAL LOW (ref 4.20–5.80)
RDW: 12.4 % (ref 11.0–15.0)
Total Lymphocyte: 15.6 %
WBC: 10.6 10*3/uL (ref 3.8–10.8)

## 2021-12-20 LAB — BASIC METABOLIC PANEL
BUN: 23 mg/dL (ref 7–25)
CO2: 26 mmol/L (ref 20–32)
Calcium: 9.1 mg/dL (ref 8.6–10.3)
Chloride: 101 mmol/L (ref 98–110)
Creat: 1.15 mg/dL (ref 0.70–1.28)
Glucose, Bld: 114 mg/dL — ABNORMAL HIGH (ref 65–99)
Potassium: 4.7 mmol/L (ref 3.5–5.3)
Sodium: 138 mmol/L (ref 135–146)

## 2021-12-20 LAB — HEPATIC FUNCTION PANEL
AG Ratio: 1.4 (calc) (ref 1.0–2.5)
ALT: 9 U/L (ref 9–46)
AST: 18 U/L (ref 10–35)
Albumin: 3.9 g/dL (ref 3.6–5.1)
Alkaline phosphatase (APISO): 79 U/L (ref 35–144)
Bilirubin, Direct: 0.1 mg/dL (ref 0.0–0.2)
Globulin: 2.7 g/dL (calc) (ref 1.9–3.7)
Indirect Bilirubin: 0.3 mg/dL (calc) (ref 0.2–1.2)
Total Bilirubin: 0.4 mg/dL (ref 0.2–1.2)
Total Protein: 6.6 g/dL (ref 6.1–8.1)

## 2021-12-20 LAB — BRAIN NATRIURETIC PEPTIDE: Brain Natriuretic Peptide: 35 pg/mL (ref <100)

## 2021-12-22 NOTE — Telephone Encounter (Signed)
[place] at [phone] and was transferred to the [facility] location. When stated to them pt's name, they could not locate him in their system for Palliative Care.  Checked to see if we had ever placed a referral to Palliative Care on pt and I do not see an order. Checked prior visits to see if this order was missed and I do not see where it was mentioned for [place] to refer pt to Palliative Care.

## 2021-12-23 NOTE — Telephone Encounter (Signed)
Order has been placed for palliative care. Nothing further needed.

## 2021-12-23 NOTE — Telephone Encounter (Signed)
That is odd. Anyways, please refer patient Tanner Bishop to home palliative care with possibility could be changed to hospice

## 2021-12-25 ENCOUNTER — Telehealth: Payer: Self-pay

## 2021-12-25 NOTE — Telephone Encounter (Signed)
Spoke with patient and patient's wife Tanner Bishop and scheduled an in-person Palliative Consult for 01/07/22 @ [facility] screening was negative. No pets in home. Patient lives with wife.  Consent obtained; updated Outlook/Netsmart/Team List and Epic.   Patient is aware he may be receiving a call from provider the day before or day of to confirm appointment.

## 2021-12-26 DIAGNOSIS — J8410 Pulmonary fibrosis, unspecified: Secondary | ICD-10-CM | POA: Diagnosis not present

## 2021-12-30 ENCOUNTER — Telehealth: Payer: Self-pay | Admitting: Pharmacist

## 2021-12-30 NOTE — Chronic Care Management (AMB) (Signed)
Chronic Care Management Pharmacy Assistant   Name: Tanner Bishop  MRN: [id number] DOB: Oct 16, 1946  Reason for Encounter: Disease State / General Medication Assessment Call   Conditions to be addressed/monitored: HLD, GERD, Osteoarthritis  Recent office visits:  10/20/2021 Grier Mitts MD - Patient was seen for primary osteoarthritis of left hip and additional issues. Started Tramadol 50 mg twice daily as needed. Follow up if symptoms worsen or fail to improve.  10/01/2021 Alysia Penna MD - Patient was seen for primary osteoarthritis of left hip. Started Meloxican 15 mg daily. No follow up noted.   08/12/2021 Alysia Penna MD - Patient was seen for acute pain of left hip. Started Diclofenac 75 mg twice daily. No follow up noted.   Recent consult visits:  12/19/2021 Brand Males MD (pulmonary) - Patient was seen for chronic respiratory failure with hypoxia and additional issues. Started Prednisone taper. No follow up noted.  11/11/2021 Brand Males MD (pulmonary) - Patient was seen for idiopathic pulmonary fibrosis and additional issues. Discontinued Albuterol. No follow up noted.  10/21/2021 Vanetta Mulders MD (orthopedic) - Patient was seen for unilateral primary osteoarthritis left hip. No medication changes. No follow up noted.  09/24/2021  Brand Males MD (pulmonary) - Patient was seen for idiopathic pulmonary fibrosis and additional issues. Changed prednisone to 5 mg daily. No follow up noted.  09/11/2021  Brand Males MD (pulmonary) - Patient was seen for idiopathic pulmonary fibrosis and additional issues. Discontinued Diclofenac. Follow up October 2022.  09/03/2021 Cristopher Peru MD (cardiology) - Patient was seen for paroxysmal atrial fibrillation. No medication changes. No follow up noted.  08/22/2021 Geraldo Pitter NP (cardiology) - Patient was seen for paroxysmal atrial fibrillation. Started Albuterol 108 mcg/act 1-2 puffs every 6 hours as needed. Discontinued  prednisone 5 mg daily and changed to Prednisone 10 mg dose pack then resume 5 mg daily. Follow up in 4 weeks.   08/19/2021 Vanetta Mulders MD (orthopedic surgery) - Patient was seen for hip pain and additional issue. No medication changes. No follow up noted.   08/14/2021 Brand Males MD (pulmonary) - Patient was seen for idiopathic pulmonary fibrosis and additional issues. Discontinued  Tylenol and Calcium. Follow up 2 weeks.    Hospital visits:  None  Medications: Outpatient Encounter Medications as of 12/30/2021  Medication Sig   chlorpheniramine-HYDROcodone (TUSSIONEX PENNKINETIC ER) 10-8 MG/5ML SUER Take 5 mLs by mouth 2 (two) times daily as needed for cough (PRN, cough, ipf, chronic cough, palliation).   Cholecalciferol (VITAMIN D) 50 MCG (2000 UT) CAPS Take 2,000 Units by mouth daily.   flecainide (TAMBOCOR) 50 MG tablet Take 1 tablet (50 mg total) by mouth 3 (three) times daily.   fluticasone (FLONASE) 50 MCG/ACT nasal spray Place 2 sprays into both nostrils daily as needed for allergies. (Patient taking differently: Place 2 sprays into both nostrils 2 (two) times daily as needed for allergies.)   meloxicam (MOBIC) 15 MG tablet Take 1 tablet (15 mg total) by mouth daily.   OFEV 150 MG CAPS TAKE 1 CAPSULE BY MOUTH TWICE DAILY WITH FOOD.   pantoprazole (PROTONIX) 40 MG tablet TAKE 1 TABLET BY MOUTH TWICE A DAY   predniSONE (DELTASONE) 10 MG tablet Take 4 tablets (40 mg total) by mouth daily with breakfast for 3 days, THEN 3 tablets (30 mg total) daily with breakfast for 3 days, THEN 2 tablets (20 mg total) daily with breakfast for 3 days, THEN 1 tablet (10 mg total) daily with breakfast for 3 days.   predniSONE (  DELTASONE) 5 MG tablet Take 1 tablet (5 mg total) by mouth daily with breakfast.   Treprostinil (TYVASO DPI TITRATION KIT) 16 & 32 & 48 MCG POWD Inhale into the lungs in the morning, at noon, in the evening, and at bedtime.   zolpidem (AMBIEN) 5 MG tablet TAKE 1 TABLET BY MOUTH  AT BEDTIME AS NEEDED FOR SLEEP   No facility-administered encounter medications on file as of 12/30/2021.  Fill History:  FLECAINIDE ACETATE 50 MG TAB 11/10/2021 90   MELOXICAM 15 MG TABLET 12/26/2021 30   PANTOPRAZOLE SOD DR 40 MG TAB 12/13/2021 90   PREDNISONE 10 MG TABLET 12/19/2021 12   ZOLPIDEM TARTRATE 5 MG TABLET 10/30/2021 30    12/30/2021 Name: Tanner Bishop MRN: [id number] DOB: 06-Dec-1946 Tanner Bishop is a 76 y.o. year old male who is a primary care patient of [name], MD.  Comprehensive medication review performed; Spoke to patient regarding cholesterol  Lipid Panel    Component Value Date/Time   CHOL 225 (H) 04/03/2021 0919   TRIG 90.0 04/03/2021 0919   HDL 69.70 04/03/2021 0919   LDLCALC 137 (H) 04/03/2021 0919   LDLDIRECT 137.9 12/17/2010 1005    10-year ASCVD risk score: The 10-year ASCVD risk score (Arnett DK, et al., 2019) is: 19.1%   Values used to calculate the score:     Age: 60 years     Sex: Male     Is Non-Hispanic African American: No     Diabetic: No     Tobacco smoker: No     Systolic Blood Pressure: 902 mmHg     Is BP treated: No     HDL Cholesterol: 69.7 mg/dL     Total Cholesterol: 225 mg/dL  Current medications reviewed:  Reviewed medications with patient, everything remains the same except he is no longer taking Tyvaso.   ASCVD risk enhancing conditions: age >72  Any recent hospitalizations or ED visits since last visit with CPP? No recent hospital visits.   What diet changes have been made?  Patient is eating two meals daily, he will generally have cereal for breakfast and a late lunch which includes a meat and vegetable.  In the evening he will eat light like a piece of fruit.   What exercise is being done?  Patient went from walking 3 miles daily to hardly able to walk to his mailbox due to the pulmonary fibrosis.   Adherence Review: Does the patient have >5 day gap between last estimated fill dates? No  Care Gaps: AWV -  scheduled 11/23/2022 Last BP - 118/70 on 12/29/2021  Star Rating Drugs: None  [place]  Clinical Pharmacist Assistant [phone]

## 2022-01-05 ENCOUNTER — Ambulatory Visit (INDEPENDENT_AMBULATORY_CARE_PROVIDER_SITE_OTHER): Payer: PPO | Admitting: Internal Medicine

## 2022-01-05 ENCOUNTER — Other Ambulatory Visit: Payer: Self-pay

## 2022-01-05 DIAGNOSIS — J9611 Chronic respiratory failure with hypoxia: Secondary | ICD-10-CM | POA: Diagnosis not present

## 2022-01-05 DIAGNOSIS — R0609 Other forms of dyspnea: Secondary | ICD-10-CM | POA: Diagnosis not present

## 2022-01-05 DIAGNOSIS — I2723 Pulmonary hypertension due to lung diseases and hypoxia: Secondary | ICD-10-CM

## 2022-01-05 DIAGNOSIS — J84112 Idiopathic pulmonary fibrosis: Secondary | ICD-10-CM

## 2022-01-05 DIAGNOSIS — R053 Chronic cough: Secondary | ICD-10-CM

## 2022-01-05 NOTE — Progress Notes (Signed)
OV 01/05/2022  Subjective:  Patient ID: Tanner Bishop, male , DOB: [birth year] , age 76 y.o. , MRN: [id number] , ADDRESS: [place] [phone] PCP [name], MD Patient Care Team: [name], MD as PCP - General Evans Lance, MD as PCP - Cardiology (Cardiology) Brand Males, MD as Consulting Physician (Pulmonary Disease) Viona Gilmore, [facility] as Pharmacist (Pharmacist)  This Provider for this visit: Treatment Team:  Attending Provider: Brand Males, MD  Type of visit: Telephone Circumstance: COVID-19 national emergency Identification of patient Tanner Bishop with 12/11/1946 and MRN [id number] - 2 person identifier Risks: Risks, benefits, limitations of telephone visit explained. Patient understood and verbalized agreement to proceed Anyone else on call: just patient Patient location: his cell phone at his home - [phone] This provider location: [address], [address]; [place]; [place] [address]. [facility]. [phone]    01/05/2022 -  IPF fullowup   Since last visti - no better. Not worse but same. Says prednisone helped some with cough. But ECOG 3-4. Says o2 drops easily nto 70s with 2L On at rest and if he stands up and walk few feet wtill drop into 70s. Portable o2  goes upto 4L. Even with 4L drops into low 80s. Told him he needs 10L and will need long tubing with home concentrator . We referred him to palliative care  - 01/07/22 at9 am is his appt. His wife ha dementia and cooks but cannot drive. He is the caregiver for her and she helps a bit too. Helps him with getting dressed, bathe and put on shoes but her memory is bad and hard for her to comprehend. Stil able to eat. He is drying to get his affairs organized. He wants video call with his son and grandaughter to set goals and life expectancy.   Quit tyvaso one week ago due to severe cough and cough improved. Now coughs when very dyspneic    PFT  PFT  Results Latest Ref Rng & Units 09/24/2021 06/12/2021 03/11/2021 06/11/2020 04/11/2020 01/12/2019 10/13/2018  FVC-Pre L 1.86 1.97 2.10 2.26 2.23 2.21 2.25  FVC-Predicted Pre % 48 50 53 57 56 56 57  FVC-Post L - - - - - 2.25 -  FVC-Predicted Post % - - - - - 57 -  Pre FEV1/FVC % % 90 89 86 88 89 87 86  Post FEV1/FCV % % - - - - - 90 -  FEV1-Pre L 1.68 1.74 1.81 2.00 1.99 1.93 1.93  FEV1-Predicted Pre % 60 62 64 70 69 67 67  FEV1-Post L - - - - - 2.03 -  DLCO uncorrected ml/min/mmHg 10.95 13.83 11.41 13.69 14.72 14.38 14.17  DLCO UNC% % 46 59 48 58 62 50 50  DLCO corrected ml/min/mmHg 11.02 13.83 11.51 13.69 14.76 - -  DLCO COR %Predicted % 47 59 48 58 62 - -  DLVA Predicted % 96 114 89 96 110 102 94  TLC L - - - - - - -  TLC % Predicted % - - - - - - -  RV % Predicted % - - - - - - -       has a past medical history of Allergy, Arthritis, Atrial flutter (HCC), Cataract, Dysrhythmia, GERD (gastroesophageal reflux disease), H/O hiatal hernia, History of kidney stones, Hyperlipidemia, Injury of right hand, Pulmonary fibrosis ([place]), Renal stone (06/2014), and Ulcer.   reports that he quit smoking about  43 years ago. His smoking use included cigarettes. He has a 16.00 pack-year smoking history. He has never used smokeless tobacco.  Past Surgical History:  Procedure Laterality Date   ATRIAL FLUTTER ABLATION N/A 04/08/2015   Procedure: ATRIAL FLUTTER ABLATION;  Surgeon: Evans Lance, MD;  [facility] C[facility] LAB;  Service: Cardiovascular;  Laterality: N/A;   BACK SURGERY  06/2021   CATARACT EXTRACTION, BILATERAL  2016   COLONOSCOPY  03/01/2017   per Dr. Loletha Carrow, clear, repeat in 5 yrs (brother had colon cancer)    ESOPHAGOGASTRODUODENOSCOPY  2007   EXTRACORPOREAL SHOCK WAVE LITHOTRIPSY  06/17/2014   per Dr. Jeffie Pollock    HAND SURGERY Right    LUMBAR LAMINECTOMY/DECOMPRESSION MICRODISCECTOMY Left 06/26/2021   Procedure: MICRODISCECTOMY LUMBAR FOUR-LUMBAR FIVE;  Surgeon: Consuella Lose, MD;   Location: [place];  Service: Neurosurgery;  Laterality: Left;   LUNG BIOPSY Left 03/22/2013   Procedure: LUNG BIOPSY;  Surgeon: Melrose Nakayama, MD;  Location: [place];  Service: Thoracic;  Laterality: Left;   POLYPECTOMY     RIGHT HEART CATH N/A 02/21/2021   Procedure: RIGHT HEART CATH;  Surgeon: Jolaine Artist, MD;  Location: [place] CV LAB;  Service: Cardiovascular;  Laterality: N/A;   RIGHT HEART CATH N/A 10/03/2021   Procedure: RIGHT HEART CATH;  Surgeon: Jolaine Artist, MD;  Location: [place] CV LAB;  Service: Cardiovascular;  Laterality: N/A;   TONSILLECTOMY     UPPER GASTROINTESTINAL ENDOSCOPY     VIDEO ASSISTED THORACOSCOPY Left 03/22/2013   Procedure: VIDEO ASSISTED THORACOSCOPY;  Surgeon: Melrose Nakayama, MD;  Location: [place];  Service: Thoracic;  Laterality: Left;    No Known Allergies  Immunization History  Administered Date(s) Administered   Fluad Quad(high Dose 65+) 08/25/2019   Influenza Split 09/13/2013   Influenza, High Dose Seasonal PF 10/06/2016, 09/16/2017, 08/17/2018, 09/01/2021   Influenza-Unspecified 09/07/2014, 09/14/2015, 09/13/2020   PFIZER(Purple Top)SARS-COV-2 Vaccination 02/12/2020, 03/12/2020, 08/14/2020   Pfizer Covid-19 Vaccine Bivalent Booster 25yr & up 09/16/2021   Pneumococcal Conjugate-13 02/16/2017   Pneumococcal Polysaccharide-23 05/26/2013   Td 12/15/2007   Tdap 02/23/2018   Zoster Recombinat (Shingrix) 10/13/2018, 07/25/2019   Zoster, Live 02/19/2014    Family History  Problem Relation Age of Onset   Liver cancer Mother    Diabetes Sister    Lung cancer Sister    Heart disease Brother    Diabetes Brother    Colon cancer Brother    Diabetes Other    Cancer Other        prostate   Colon polyps Neg Hx    Esophageal cancer Neg Hx    Rectal cancer Neg Hx    Stomach cancer Neg Hx      Current Outpatient Medications:    chlorpheniramine-HYDROcodone (TUSSIONEX PENNKINETIC ER) 10-8 MG/5ML SUER, Take 5 mLs by  mouth 2 (two) times daily as needed for cough (PRN, cough, ipf, chronic cough, palliation)., Disp: 140 mL, Rfl: 0   Cholecalciferol (VITAMIN D) 50 MCG (2000 UT) CAPS, Take 2,000 Units by mouth daily., Disp: , Rfl:    flecainide (TAMBOCOR) 50 MG tablet, Take 1 tablet (50 mg total) by mouth 3 (three) times daily., Disp: 270 tablet, Rfl: 6   fluticasone (FLONASE) 50 MCG/ACT nasal spray, Place 2 sprays into both nostrils daily as needed for allergies. (Patient taking differently: Place 2 sprays into both nostrils 2 (two) times daily as needed for allergies.), Disp: , Rfl:    meloxicam (MOBIC) 15 MG tablet, Take 1 tablet (15  mg total) by mouth daily., Disp: 30 tablet, Rfl: 5   OFEV 150 MG CAPS, TAKE 1 CAPSULE BY MOUTH TWICE DAILY WITH FOOD., Disp: 60 capsule, Rfl: 10   pantoprazole (PROTONIX) 40 MG tablet, TAKE 1 TABLET BY MOUTH TWICE A DAY, Disp: 180 tablet, Rfl: 2   predniSONE (DELTASONE) 5 MG tablet, Take 1 tablet (5 mg total) by mouth daily with breakfast., Disp: , Rfl:    Treprostinil (TYVASO DPI TITRATION KIT) 16 & 32 & 48 MCG POWD, Inhale into the lungs in the morning, at noon, in the evening, and at bedtime., Disp: , Rfl:    zolpidem (AMBIEN) 5 MG tablet, TAKE 1 TABLET BY MOUTH AT BEDTIME AS NEEDED FOR SLEEP, Disp: 30 tablet, Rfl: 5      Objective:   There were no vitals filed for this visit.  Estimated body mass index is 25.03 kg/m as calculated from the following:   Height as of 12/19/21: _0  (1.727 m).   Weight as of 12/19/21: 164 lb 9.6 oz (74.7 kg).  _1 @  There were no vitals filed for this visit.   Physical Exam  Dyspneic pn phone talking AxOX3      Assessment:       ICD-10-CM   1. Chronic respiratory failure with hypoxia (HCC)  J96.11     2. IPF (idiopathic pulmonary fibrosis) ([place])  J84.112     3. 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 Chronic cough  R05.3     5. Dyspnea on exertion  R06.09          Plan:     Patient  Instructions     ICD-10-CM   1. Chronic respiratory failure with hypoxia (HCC)  J96.11     2. IPF (idiopathic pulmonary fibrosis) ([place])  J84.112     3. 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 Chronic cough  R05.3     5. Dyspnea on exertion  R06.09        Really concerned even more worsening of disease and nearing end of life -few months to a year but will not be surprised if you passed away < 6 months. Makes you hospice eligible   Plan --Continue 2 L of oxygen even at rest and 8-10 L with exertion  - keep pulse ox > 88%  - CMA to inform DME company to give him long tubing to do ADL -continue prednisone  32m per day -No more  inhaled treprostinil  - continue opioid cough syrup --change palliative care consult 01/07/22 to hospice evalaution - no more PFT - no more CT scan -continue ofev for now but might have to stop once you and if you enter hospice -  Follow-up -7-12  days video  visit (no more face to face visit)- with Dr RChase Caller- with family and patient for continued goals of care  - if video not possible do telephone visit but ideally prefer video visit   (Telephone visit - Level 02 visit: Estb 11-20 for this visit type which was visit type: telephone visit in total care time and counseling or/and coordination of care by this undersigned MD - Dr MBrand Males This includes one or more of the following for care delivered on 01/05/2022 same day: pre-charting, chart review, note writing, documentation discussion of test results, diagnostic or treatment recommendations, prognosis, risks and benefits of management options, instructions, education, compliance or risk-factor reduction. It excludes time  spent by the [place] or office staff in the care of the patient. Actual time was 18 min. E&M code is [phone])   SIGNATURE    Dr. Brand Males, M.D., F.C.C.P,  Pulmonary and Critical Care Medicine Staff Physician, [place] Director -  Interstitial Lung Disease  Program  Pulmonary [place] at [place], [place], [address]  Pager: [phone], If no answer or between  15:00h - 7:00h: call [phone] Telephone: [phone]  11:13 AM 01/05/2022

## 2022-01-05 NOTE — Addendum Note (Signed)
Addended by: Lorretta Harp on: 01/05/2022 11:30 AM   Modules accepted: Orders

## 2022-01-05 NOTE — Patient Instructions (Addendum)
ICD-10-CM   1. Chronic respiratory failure with hypoxia (HCC)  J96.11     2. IPF (idiopathic pulmonary fibrosis) ([place])  J84.112     3. 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 Chronic cough  R05.3     5. Dyspnea on exertion  R06.09        Really concerned even more worsening of disease and nearing end of life -few months to a year but will not be surprised if you passed away < 6 months. Makes you hospice eligible   Plan --Continue 2 L of oxygen even at rest and 8-10 L with exertion  - keep pulse ox > 88%  - CMA to inform DME company to give him long tubing to do ADL -continue prednisone  78m per day -No more  inhaled treprostinil  - continue opioid cough syrup --change palliative care consult 01/07/22 to hospice evalaution - no more PFT - no more CT scan -continue ofev for now but might have to stop once you and if you enter hospice -  Follow-up -7-12  days video  visit (no more face to face visit)- with Dr RChase Caller- with family and patient for continued goals of care  - if video not possible do telephone visit but ideally prefer video visit

## 2022-01-07 ENCOUNTER — Ambulatory Visit (HOSPITAL_BASED_OUTPATIENT_CLINIC_OR_DEPARTMENT_OTHER): Payer: PPO | Admitting: Orthopaedic Surgery

## 2022-01-07 ENCOUNTER — Other Ambulatory Visit: Payer: Self-pay

## 2022-01-07 ENCOUNTER — Other Ambulatory Visit: Payer: PPO | Admitting: Hospice

## 2022-01-07 DIAGNOSIS — M1612 Unilateral primary osteoarthritis, left hip: Secondary | ICD-10-CM | POA: Diagnosis not present

## 2022-01-07 DIAGNOSIS — M25552 Pain in left hip: Secondary | ICD-10-CM

## 2022-01-07 MED ORDER — TRIAMCINOLONE ACETONIDE 40 MG/ML IJ SUSP
80.0000 mg | INTRAMUSCULAR | Status: AC | PRN
  Administered 2022-01-07: 13:00:00 80 mg via INTRA_ARTICULAR

## 2022-01-07 MED ORDER — LIDOCAINE HCL 1 % IJ SOLN
4.0000 mL | INTRAMUSCULAR | Status: AC | PRN
Start: 2022-01-07 — End: 2022-01-07
  Administered 2022-01-07: 13:00:00 4 mL

## 2022-01-07 NOTE — Progress Notes (Signed)
Chief Complaint: Left hip pain     History of Present Illness:   01/07/2022: Presents today for follow-up of the left hip.  Presents today for follow-up as he continues to experience improvement with injections.  He states that he was recently placed on hospice per his pulmonary doctor  Tanner Bishop is a 76 y.o. male with left hip and buttock pain now for several months.  He states that he did have a lumbar spine surgery done in July 2022 after which he had no pain whatsoever in the leg.  He states he has been more active after that surgery and now experiences pain and what he describes as the buttock region.  This pain does not radiate down the leg.  He describes the pain as sore and that ultimately resting relieves the pain.  He is very active during the day.  He has been taking NSAIDs which helped somewhat for the pain.  He also endorses popping and clicking in the hip.  He has not had any previous injections.  He is scheduled to see a spine surgeon the following day    Surgical History:   No previous hip surgery  PMH/PSH/Family History/Social History/Meds/Allergies:    Past Medical History:  Diagnosis Date   Allergy    Arthritis    Atrial flutter ([employer])    a. s/p ablation 03/2015 Dr Lovena Le   Cataract    removed both eyes   Dysrhythmia    GERD (gastroesophageal reflux disease)    H/O hiatal hernia    History of kidney stones    Hyperlipidemia    Injury of right hand    permanent damage after workplace 2009 and 2010 Dr Lang[facility]   Pulmonary fibrosis [facility])    sees Dr. Onnie Graham    Renal stone 06/2014   Ulcer    unresolved   Past Surgical History:  Procedure Laterality Date   ATRIAL FLUTTER ABLATION N/A 04/08/2015   Procedure: ATRIAL FLUTTER ABLATION;  Surgeon: Evans Lance, MD;  Location: [facility] CATH LAB;  Service: Cardiovascular;  Laterality: N/A;   BACK SURGERY  06/2021   CATARACT EXTRACTION, BILATERAL  2016    COLONOSCOPY  03/01/2017   per Dr. Loletha Carrow, clear, repeat in 5 yrs (brother had colon cancer)    ESOPHAGOGASTRODUODENOSCOPY  2007   EXTRACORPOREAL SHOCK WAVE LITHOTRIPSY  06/17/2014   per Dr. Jeffie Pollock    HAND SURGERY Right    LUMBAR LAMINECTOMY/DECOMPRESSION MICRODISCECTOMY Left 06/26/2021   Procedure: MICRODISCECTOMY LUMBAR FOUR-LUMBAR FIVE;  Surgeon: Consuella Lose, MD;  Location: [place];  Service: Neurosurgery;  Laterality: Left;   LUNG BIOPSY Left 03/22/2013   Procedure: LUNG BIOPSY;  Surgeon: Melrose Nakayama, MD;  Location: [place];  Service: Thoracic;  Laterality: Left;   POLYPECTOMY     RIGHT HEART CATH N/A 02/21/2021   Procedure: RIGHT HEART CATH;  Surgeon: Jolaine Artist, MD;  Location: [place] CV LAB;  Service: Cardiovascular;  Laterality: N/A;   RIGHT HEART CATH N/A 10/03/2021   Procedure: RIGHT HEART CATH;  Surgeon: Jolaine Artist, MD;  Location: [place] CV LAB;  Service: Cardiovascular;  Laterality: N/A;   TONSILLECTOMY     UPPER GASTROINTESTINAL ENDOSCOPY     VIDEO ASSISTED THORACOSCOPY Left 03/22/2013   Procedure: VIDEO ASSISTED THORACOSCOPY;  Surgeon: Melrose Nakayama,  MD;  Location: [facility];  Service: Thoracic;  Laterality: Left;   Social History   Socioeconomic History   Marital status: Married    Spouse name: Not on file   Number of children: Not on file   Years of education: Not on file   Highest education level: Not on file  Occupational History   Occupation: LABORER    Employer: Chemical engineer   Occupation: Disabled   Tobacco Use   Smoking status: Former    Packs/day: 1.00    Years: 16.00    Pack years: 16.00    Types: Cigarettes    Quit date: 12/14/1978    Years since quitting: 43.0   Smokeless tobacco: Never  Vaping Use   Vaping Use: Never used  Substance and Sexual Activity   Alcohol use: No    Alcohol/week: 0.0 standard drinks   Drug use: No   Sexual activity: Not on file  Other Topics Concern   Not on file  Social  History Narrative   Not on file   Social Determinants of Health   Financial Resource Strain: Low Risk    Difficulty of Paying Living Expenses: Not hard at all  Food Insecurity: No Food Insecurity   Worried About Charity fundraiser in the Last Year: Never true   [place] in the Last Year: Never true  Transportation Needs: No Transportation Needs   Lack of Transportation (Medical): No   Lack of Transportation (Non-Medical): No  Physical Activity: Inactive   Days of Exercise per Week: 0 days   Minutes of Exercise per Session: 0 min  Stress: No Stress Concern Present   Feeling of Stress : Only a little  Social Connections: Engineer, building services of Communication with Friends and Family: More than three times a week   Frequency of Social Gatherings with Friends and Family: More than three times a week   Attends Religious Services: More than 4 times per year   Active Member of [employer] or Organizations: Yes   Attends Music therapist: More than 4 times per year   Marital Status: Married   Family History  Problem Relation Age of Onset   Liver cancer Mother    Diabetes Sister    Lung cancer Sister    Heart disease Brother    Diabetes Brother    Colon cancer Brother    Diabetes Other    Cancer Other        prostate   Colon polyps Neg Hx    Esophageal cancer Neg Hx    Rectal cancer Neg Hx    Stomach cancer Neg Hx    No Known Allergies Current Outpatient Medications  Medication Sig Dispense Refill   chlorpheniramine-HYDROcodone (TUSSIONEX PENNKINETIC ER) 10-8 MG/5ML SUER Take 5 mLs by mouth 2 (two) times daily as needed for cough (PRN, cough, ipf, chronic cough, palliation). 140 mL 0   Cholecalciferol (VITAMIN D) 50 MCG (2000 UT) CAPS Take 2,000 Units by mouth daily.     flecainide (TAMBOCOR) 50 MG tablet Take 1 tablet (50 mg total) by mouth 3 (three) times daily. 270 tablet 6   fluticasone (FLONASE) 50 MCG/ACT nasal spray Place 2 sprays into both  nostrils daily as needed for allergies. (Patient taking differently: Place 2 sprays into both nostrils 2 (two) times daily as needed for allergies.)     meloxicam (MOBIC) 15 MG tablet Take 1 tablet (15 mg total) by mouth daily. 30 tablet 5   OFEV  150 MG CAPS TAKE 1 CAPSULE BY MOUTH TWICE DAILY WITH FOOD. 60 capsule 10   pantoprazole (PROTONIX) 40 MG tablet TAKE 1 TABLET BY MOUTH TWICE A DAY 180 tablet 2   predniSONE (DELTASONE) 5 MG tablet Take 1 tablet (5 mg total) by mouth daily with breakfast.     zolpidem (AMBIEN) 5 MG tablet TAKE 1 TABLET BY MOUTH AT BEDTIME AS NEEDED FOR SLEEP 30 tablet 5   No current facility-administered medications for this visit.   No results found.  Review of Systems:   A ROS was performed including pertinent positives and negatives as documented in the HPI.  Physical Exam :   Constitutional: NAD and appears stated age Neurological: Alert and oriented Psych: Appropriate affect and cooperative There were no vitals taken for this visit.   Comprehensive Musculoskeletal Exam:    Inspection Right Left  Skin No atrophy or gross abnormalities appreciated No atrophy or gross abnormalities appreciated  Palpation    Tenderness None None  Crepitus None None  Range of Motion    Flexion (passive) 120 120  Extension 30 30  IR 30 30 with pain  ER 40 40  Strength    Flexion  5/5 5/5  Extension 5/5 5/5  Special Tests    FABIR Negative Negative  FADER Negative Negative  ER Lag/Capsular Insufficiency Negative Negative  Instability Negative Negative  Sacroiliac pain Negative  Negative   Instability    Generalized Laxity No No  Neurologic    sciatic, femoral, obturator nerves intact to light sensation  Vascular/Lymphatic    DP pulse 2+ 2+  Lumbar Exam    Patient has symmetric lumbar range of motion with negative pain referral to hip     Imaging:   Xray (left hip 3 views): There is moderate osteoarthritis about left hip  I personally reviewed and  interpreted the radiographs.   Assessment:   76 year old male with left hip osteoarthritis he would like another left ultrasound-guided injection today as he continues to experience relief from this.  I do believe that this is a reasonable treatment strategy given his poor prognosis with his pulmonary disease.  Plan :    -Left hip ultrasound-guided injection provided after verbal consent obtained -He will follow-up should he want another injection     Procedure Note  Patient: Tarrin Lebow             Date of Birth: [birth year]           MRN: [id number]             Visit Date: 01/07/2022  Procedures: Visit Diagnoses:  No diagnosis found.   Large Joint Inj on 01/07/2022 1:17 PM Indications: pain Details: 22 G 3.5 in needle, ultrasound-guided anterolateral approach  Arthrogram: No  Medications: 4 mL lidocaine 1 %; 80 mg triamcinolone acetonide 40 MG/ML Outcome: tolerated well, no immediate complications Procedure, treatment alternatives, risks and benefits explained, specific risks discussed. Consent was given by the patient. Immediately prior to procedure a time out was called to verify the correct patient, procedure, equipment, support staff and site/side marked as required. Patient was prepped and draped in the usual sterile fashion.       I personally saw and evaluated the patient, and participated in the management and treatment plan.  Vanetta Mulders, MD Attending Physician, Orthopedic Surgery  This document was dictated using Dragon voice recognition software. A reasonable attempt at proof reading has been made to minimize errors.

## 2022-01-12 ENCOUNTER — Telehealth: Payer: Self-pay | Admitting: Internal Medicine

## 2022-01-12 ENCOUNTER — Other Ambulatory Visit: Payer: Self-pay

## 2022-01-12 ENCOUNTER — Telehealth: Admitting: Internal Medicine

## 2022-01-12 DIAGNOSIS — J84112 Idiopathic pulmonary fibrosis: Secondary | ICD-10-CM | POA: Diagnosis not present

## 2022-01-12 DIAGNOSIS — J9611 Chronic respiratory failure with hypoxia: Secondary | ICD-10-CM

## 2022-01-12 NOTE — Patient Instructions (Addendum)
ICD-10-CM   1. Chronic respiratory failure with hypoxia (HCC)  J96.11     2. IPF (idiopathic pulmonary fibrosis) ([place])  J84.112     3. 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 Chronic cough  R05.3     5. Dyspnea on exertion  R06.09      Video vsiit wth family on Friday 01/16/22 at his request Will contact [facility] to see if we can get portable o2

## 2022-01-12 NOTE — Progress Notes (Signed)
OV 01/05/2022  Subjective:  Patient ID: Tanner Bishop, male , DOB: 10/23/1946 , age 76 y.o. , MRN: [id number] , ADDRESS: [place] [id number] PCP [name], MD Patient Care Team: [name], MD as PCP - General Evans Lance, MD as PCP - Cardiology (Cardiology) Brand Males, MD as Consulting Physician (Pulmonary Disease) Viona Gilmore, [facility] as Pharmacist (Pharmacist)  This Provider for this visit: Treatment Team:  Attending Provider: Brand Males, MD  Type of visit: Telephone Circumstance: COVID-19 national emergency Identification of patient Tanner Bishop with [birth year] and MRN [id number] - 2 person identifier Risks: Risks, benefits, limitations of telephone visit explained. Patient understood and verbalized agreement to proceed Anyone else on call: just patient Patient location: his cell phone at his home - [phone] This provider location: [address], [address]; [place]; [place] [address]. [facility]. [phone]    01/05/2022 -  IPF fullowup   Since last visti - no better. Not worse but same. Says prednisone helped some with cough. But ECOG 3-4. Says o2 drops easily nto 70s with 2L On at rest and if he stands up and walk few feet wtill drop into 70s. Portable o2  goes upto 4L. Even with 4L drops into low 80s. Told him he needs 10L and will need long tubing with home concentrator . We referred him to palliative care  - 01/07/22 at9 am is his appt. His wife ha dementia and cooks but cannot drive. He is the caregiver for her and she helps a bit too. Helps him with getting dressed, bathe and put on shoes but her memory is bad and hard for her to comprehend. Stil able to eat. He is drying to get his affairs organized. He wants video call with his son and grandaughter to set goals and life expectancy.   Quit tyvaso one week ago due to severe cough and cough improved. Now coughs when very dyspneic    OV  01/12/2022  Subjective:  Patient ID: Tanner Bishop, male , DOB: [birth year] , age 43 y.o. , MRN: [id number] , ADDRESS: [place] [id number] PCP [name], MD Patient Care Team: [name], MD as PCP - General Evans Lance, MD as PCP - Cardiology (Cardiology) Brand Males, MD as Consulting Physician (Pulmonary Disease) Viona Gilmore, [facility] as Pharmacist (Pharmacist)  This Provider for this visit: Treatment Team:  Attending Provider: Brand Males, MD Type of visit: Telephone/ Circumstance: COVID-19 national emergency Identification of patient Tanner Bishop with [birth year] and MRN [id number] - 2 person identifier Risks: Risks, benefits, limitations of telephone visit explained. Patient understood and verbalized agreement to proceed Anyone else on call: just patient Patient location: his home This provider location: [address], [address]; [place]; [place] [address]. [facility]. [phone]   S: called for video for family conference but family NA - so we convered to telephone call > he now has hospice . Says he does not want to give up his portable o2 and [facility] is talking to hospice. HE is unable to afford paying out of pocket for portable o2. Feels very dyspneic.     PFT  PFT Results Latest Ref Rng & Units 09/24/2021 06/12/2021 03/11/2021 06/11/2020 04/11/2020 01/12/2019 10/13/2018  FVC-Pre L 1.86 1.97 2.10 2.26 2.23 2.21 2.25  FVC-Predicted Pre % 48 50 53 57 56 56 57  FVC-Post L - - - - -  2.25 -  FVC-Predicted Post % - - - - - 57 -  Pre FEV1/FVC % % 90 89 86 88 89 87 86  Post FEV1/FCV % % - - - - - 90 -  FEV1-Pre L 1.68 1.74 1.81 2.00 1.99 1.93 1.93  FEV1-Predicted Pre % 60 62 64 70 69 67 67  FEV1-Post L - - - - - 2.03 -  DLCO uncorrected ml/min/mmHg 10.95 13.83 11.41 13.69 14.72 14.38 14.17  DLCO UNC% % 46 59 48 58 62 50 50  DLCO corrected ml/min/mmHg 11.02 13.83 11.51 13.69 14.76 - -  DLCO COR %Predicted % 47 59 48  58 62 - -  DLVA Predicted % 96 114 89 96 110 102 94  TLC L - - - - - - -  TLC % Predicted % - - - - - - -  RV % Predicted % - - - - - - -       has a past medical history of Allergy, Arthritis, Atrial flutter (HCC), Cataract, Dysrhythmia, GERD (gastroesophageal reflux disease), H/O hiatal hernia, History of kidney stones, Hyperlipidemia, Injury of right hand, Pulmonary fibrosis ([place]), Renal stone (06/2014), and Ulcer.   reports that he quit smoking about 43 years ago. His smoking use included cigarettes. He has a 16.00 pack-year smoking history. He has never used smokeless tobacco.  Past Surgical History:  Procedure Laterality Date   ATRIAL FLUTTER ABLATION N/A 04/08/2015   Procedure: ATRIAL FLUTTER ABLATION;  Surgeon: Evans Lance, MD;  Location: [facility] CATH LAB;  Service: Cardiovascular;  Laterality: N/A;   BACK SURGERY  06/2021   CATARACT EXTRACTION, BILATERAL  2016   COLONOSCOPY  03/01/2017   per Dr. Loletha Carrow, clear, repeat in 5 yrs (brother had colon cancer)    ESOPHAGOGASTRODUODENOSCOPY  2007   EXTRACORPOREAL SHOCK WAVE LITHOTRIPSY  06/17/2014   per Dr. Jeffie Pollock    HAND SURGERY Right    LUMBAR LAMINECTOMY/DECOMPRESSION MICRODISCECTOMY Left 06/26/2021   Procedure: MICRODISCECTOMY LUMBAR FOUR-LUMBAR FIVE;  Surgeon: Consuella Lose, MD;  Location: [place];  Service: Neurosurgery;  Laterality: Left;   LUNG BIOPSY Left 03/22/2013   Procedure: LUNG BIOPSY;  Surgeon: Melrose Nakayama, MD;  Location: [place];  Service: Thoracic;  Laterality: Left;   POLYPECTOMY     RIGHT HEART CATH N/A 02/21/2021   Procedure: RIGHT HEART CATH;  Surgeon: Jolaine Artist, MD;  Location: [place] CV LAB;  Service: Cardiovascular;  Laterality: N/A;   RIGHT HEART CATH N/A 10/03/2021   Procedure: RIGHT HEART CATH;  Surgeon: Jolaine Artist, MD;  Location: [place] CV LAB;  Service: Cardiovascular;  Laterality: N/A;   TONSILLECTOMY     UPPER GASTROINTESTINAL ENDOSCOPY     VIDEO ASSISTED  THORACOSCOPY Left 03/22/2013   Procedure: VIDEO ASSISTED THORACOSCOPY;  Surgeon: Melrose Nakayama, MD;  Location: [place];  Service: Thoracic;  Laterality: Left;    No Known Allergies  Immunization History  Administered Date(s) Administered   Fluad Quad(high Dose 65+) 08/25/2019   Influenza Split 09/13/2013   Influenza, High Dose Seasonal PF 10/06/2016, 09/16/2017, 08/17/2018, 09/01/2021   Influenza-Unspecified 09/07/2014, 09/14/2015, 09/13/2020   PFIZER(Purple Top)SARS-COV-2 Vaccination 02/12/2020, 03/12/2020, 08/14/2020   Pfizer Covid-19 Vaccine Bivalent Booster 83yr & up 09/16/2021   Pneumococcal Conjugate-13 02/16/2017   Pneumococcal Polysaccharide-23 05/26/2013   Td 12/15/2007   Tdap 02/23/2018   Zoster Recombinat (Shingrix) 10/13/2018, 07/25/2019   Zoster, Live 02/19/2014    Family History  Problem Relation Age of Onset   Liver cancer Mother  Diabetes Sister    Lung cancer Sister    Heart disease Brother    Diabetes Brother    Colon cancer Brother    Diabetes Other    Cancer Other        prostate   Colon polyps Neg Hx    Esophageal cancer Neg Hx    Rectal cancer Neg Hx    Stomach cancer Neg Hx      Current Outpatient Medications:    chlorpheniramine-HYDROcodone (TUSSIONEX PENNKINETIC ER) 10-8 MG/5ML SUER, Take 5 mLs by mouth 2 (two) times daily as needed for cough (PRN, cough, ipf, chronic cough, palliation)., Disp: 140 mL, Rfl: 0   Cholecalciferol (VITAMIN D) 50 MCG (2000 UT) CAPS, Take 2,000 Units by mouth daily., Disp: , Rfl:    flecainide (TAMBOCOR) 50 MG tablet, Take 1 tablet (50 mg total) by mouth 3 (three) times daily., Disp: 270 tablet, Rfl: 6   fluticasone (FLONASE) 50 MCG/ACT nasal spray, Place 2 sprays into both nostrils daily as needed for allergies. (Patient taking differently: Place 2 sprays into both nostrils 2 (two) times daily as needed for allergies.), Disp: , Rfl:    meloxicam (MOBIC) 15 MG tablet, Take 1 tablet (15 mg total) by mouth daily.,  Disp: 30 tablet, Rfl: 5   OFEV 150 MG CAPS, TAKE 1 CAPSULE BY MOUTH TWICE DAILY WITH FOOD., Disp: 60 capsule, Rfl: 10   pantoprazole (PROTONIX) 40 MG tablet, TAKE 1 TABLET BY MOUTH TWICE A DAY, Disp: 180 tablet, Rfl: 2   predniSONE (DELTASONE) 5 MG tablet, Take 1 tablet (5 mg total) by mouth daily with breakfast., Disp: , Rfl:    zolpidem (AMBIEN) 5 MG tablet, TAKE 1 TABLET BY MOUTH AT BEDTIME AS NEEDED FOR SLEEP, Disp: 30 tablet, Rfl: 5      Objective:   There were no vitals filed for this visit.  Estimated body mass index is 25.03 kg/m as calculated from the following:   Height as of 12/19/21: _0  (1.727 m).   Weight as of 12/19/21: 164 lb 9.6 oz (74.7 kg).  _1 @  There were no vitals filed for this visit.   Physical Exam Sounded ok on phone   Assessment:       ICD-10-CM   1. Chronic respiratory failure with hypoxia (HCC)  J96.11     2. IPF (idiopathic pulmonary fibrosis) ([place])  W09.811          Plan:     Patient Instructions     ICD-10-CM   1. Chronic respiratory failure with hypoxia (HCC)  J96.11     2. IPF (idiopathic pulmonary fibrosis) ([place])  J84.112     3. 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 Chronic cough  R05.3     5. Dyspnea on exertion  R06.09      Video vsiit wth family on Friday 01/16/22 at his request Will contact [facility] to see if we can get portable o2   (Telephone visit - Level 02 visit: Estb 11-20 for this visit type which was visit type: telephone visit in total care time and counseling or/and coordination of care by this undersigned MD - Dr Brand Males. This includes one or more of the following for care delivered on 01/12/2022 same day: pre-charting, chart review, note writing, documentation discussion of test results, diagnostic or treatment recommendations, prognosis, risks and benefits of management options, instructions, education, compliance or risk-factor reduction. It excludes time spent by the  [place] or office staff  in the care of the patient. Actual time was 15 min. E&M code is [phone])   SIGNATURE    Dr. Brand Males, M.D., F.C.C.P,  Pulmonary and Critical Care Medicine Staff Physician, [place] Director - Interstitial Lung Disease  Program  Pulmonary [place] at [place], [place], [address]  Pager: [phone], If no answer or between  15:00h - 7:00h: call [phone] Telephone: [phone]  10:10 PM 01/12/2022

## 2022-01-12 NOTE — Telephone Encounter (Signed)
Patient is scheduled for a mychart video visit on 01/16/2022 at 1pm.

## 2022-01-12 NOTE — Telephone Encounter (Signed)
Tanner Bishop visit today 01/12/2022 got covnerted from video visit to a telephone visit because his family was not there.  He specifically is asking for a video visit on Friday, 01/16/2022.  I looked at my schedule.  And based on the patient's and his family schedule, please set for a video visit at 1 PM on Friday, 01/16/2022  Basically doing a goals of care and therefore a CMA assistance is not required   Thanks    SIGNATURE    Dr. Brand Males, M.D., F.C.C.P,  Pulmonary and Critical Care Medicine Staff Physician, [place] Director - Interstitial Lung Disease  Program  Pulmonary [place] at [place], [place], [address]  NPI Number:  NPI #[id number] [place] Number: [id number]  Pager: [phone], If no answer  -> Check AMION or Try [phone] Telephone (clinical office): [phone] Telephone (research): [phone]  11:09 AM 01/12/2022

## 2022-01-15 ENCOUNTER — Other Ambulatory Visit: Payer: Self-pay | Admitting: Internal Medicine

## 2022-01-15 MED ORDER — HYDROCOD POLI-CHLORPHE POLI ER 10-8 MG/5ML PO SUER: 5.0000 mL | Freq: Two times a day (BID) | ORAL | 0 refills | Status: DC | PRN

## 2022-01-15 NOTE — Telephone Encounter (Signed)
done

## 2022-01-15 NOTE — Telephone Encounter (Signed)
Rx has been pended and is being routed back to MR.

## 2022-01-15 NOTE — Telephone Encounter (Signed)
Spoke to [place] with [facility] Izora Gala is requesting a refill on tussionex 10-46m/5ml.  Last refilled 12/19/2021 #140 with 0 refills.  Last OV 01/12/2022 with pending OV 01/16/2022. [facility] [place]   MR, please advise. Thanks

## 2022-01-16 ENCOUNTER — Telehealth (INDEPENDENT_AMBULATORY_CARE_PROVIDER_SITE_OTHER): Payer: PPO | Admitting: Internal Medicine

## 2022-01-16 ENCOUNTER — Telehealth: Payer: Self-pay | Admitting: Internal Medicine

## 2022-01-16 ENCOUNTER — Other Ambulatory Visit: Payer: Self-pay

## 2022-01-16 DIAGNOSIS — R0609 Other forms of dyspnea: Secondary | ICD-10-CM | POA: Diagnosis not present

## 2022-01-16 DIAGNOSIS — J84112 Idiopathic pulmonary fibrosis: Secondary | ICD-10-CM

## 2022-01-16 DIAGNOSIS — R053 Chronic cough: Secondary | ICD-10-CM

## 2022-01-16 DIAGNOSIS — Z515 Encounter for palliative care: Secondary | ICD-10-CM | POA: Diagnosis not present

## 2022-01-16 DIAGNOSIS — I2723 Pulmonary hypertension due to lung diseases and hypoxia: Secondary | ICD-10-CM

## 2022-01-16 DIAGNOSIS — Z7189 Other specified counseling: Secondary | ICD-10-CM | POA: Diagnosis not present

## 2022-01-16 DIAGNOSIS — J9611 Chronic respiratory failure with hypoxia: Secondary | ICD-10-CM

## 2022-01-16 NOTE — Telephone Encounter (Signed)
Please let hospice know I am recommending oral morphine syrup for dyspnea and couhg

## 2022-01-16 NOTE — Telephone Encounter (Signed)
Called and spoke with Izora Gala from [facility] letting her know that MR refilled pt's cough med for him and she verbalized understanding. Nothing further needed.

## 2022-01-16 NOTE — Telephone Encounter (Signed)
Spoke with Izora Gala and reviewed [name] recommendations. Izora Gala stated understanding and stated she would have everything to start on Monday as it is currently 1707 on Friday. Nothing further needed at this time.

## 2022-01-16 NOTE — Telephone Encounter (Signed)
Lm for Tanner Bishop.

## 2022-01-16 NOTE — Patient Instructions (Signed)
ICD-10-CM   1. Chronic respiratory failure with hypoxia (HCC)  J96.11     2. IPF (idiopathic pulmonary fibrosis) ([place])  J84.112     3. 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 Chronic cough  R05.3     5. Dyspnea on exertion  R06.09     6. Palliative care encounter  Z51.5     7. Hospice care patient  Z51.5     8. Goals of care, counseling/discussion  Z71.89      Continue oxygen Have discussion on DNR with [facility] [place] prednisone Will inform [facility] to start oral moprhine syrup for cough and dyspnea contol   Followup Video visit In 1 month with Tammy PArrett or Dr Chase Caller - 15 min

## 2022-01-16 NOTE — Telephone Encounter (Signed)
Pt had video visit today 01/16/22 with MR. Info below was stated during visit:  Continue oxygen Have discussion on DNR with [facility] [place] prednisone Will inform [facility] to start oral moprhine syrup for cough and dyspnea control     Attempted to call Izora Gala with [facility] to further discuss this with her but unable to reach. Left message for her to return call.

## 2022-01-16 NOTE — Progress Notes (Signed)
Type of visit: Video Circumstance: COVID-19 national emergency Identification of patient Tanner Bishop with Mar 26, 1946 and MRN [id number] - 2 person identifier Risks: Risks, benefits, limitations of telephone visit explained. Patient understood and verbalized agreement to proceed Anyone else on call: whole family of son, daughter, daughter in law, wife, grandaugher Patient location: his home This provider location: [address] [place] [address] [address]; [place]; [place] [address]. [place] [facility]. [phone]    Xxx S: In this video visit the family*for goals of care.  He is now established with hospice care.  They wanted to know life expectancy.  I did indicate to them it could be few months or a year or year and a half.  Is currently on 3 L 4 L with rest and 6-8 L with exertion.  His son is most concerned about his severe cough and is a daughter.  Currently he is on prednisone and also Tussionex.  This is not helping.  I informed them that I would tell the hospice care to start oral morphine which can help his shortness of breath and cough.  He has class III-4 dyspnea on exertion.  Very minimal exertion makes him desaturate and cough severely.  He is unable to do ADLs.  Did explain the progressive nature of the disease.  Obje  - Family is gathered around.  He looks mildly cushingoid he has oxygen on.  He was able to have a conversation.  He was sitting on sofa     ICD-10-CM   1. Chronic respiratory failure with hypoxia (HCC)  J96.11     2. IPF (idiopathic pulmonary fibrosis) ([place])  J84.112     3. 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 Chronic cough  R05.3     5. Dyspnea on exertion  R06.09     6. Palliative care encounter  Z51.5     7. Hospice care patient  Z51.5     8. Goals of care, counseling/discussion  Z71.89       Patient Instructions     ICD-10-CM   1. Chronic respiratory failure with hypoxia (HCC)  J96.11     2. IPF (idiopathic pulmonary  fibrosis) ([place])  J84.112     3. 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 Chronic cough  R05.3     5. Dyspnea on exertion  R06.09     6. Palliative care encounter  Z51.5     7. Hospice care patient  Z51.5     8. Goals of care, counseling/discussion  Z71.89      Continue oxygen Have discussion on DNR with [facility] [place] prednisone Will inform [facility] to start oral moprhine syrup for cough and dyspnea contol   Followup Video visit In 1 month with Tammy PArrett or Dr Chase Caller - 27 min     SIGNATURE    Dr. Brand Males, M.D., F.C.C.P,  Pulmonary and Critical Care Medicine Staff Physician, [place] Director - Interstitial Lung Disease  Program  Pulmonary [place] at [place], [place], [address]  NPI Number:  NPI #[id number] [place] Number: [id number]  Pager: [phone], If no answer  -> Check AMION or Try [phone] Telephone (clinical office): [phone] Telephone (research): [phone]  1:25 PM 01/16/2022

## 2022-01-26 DIAGNOSIS — J8410 Pulmonary fibrosis, unspecified: Secondary | ICD-10-CM | POA: Diagnosis not present

## 2022-02-09 DIAGNOSIS — Z6825 Body mass index (BMI) 25.0-25.9, adult: Secondary | ICD-10-CM | POA: Diagnosis not present

## 2022-02-09 DIAGNOSIS — M47816 Spondylosis without myelopathy or radiculopathy, lumbar region: Secondary | ICD-10-CM | POA: Diagnosis not present

## 2022-02-10 ENCOUNTER — Telehealth: Payer: Self-pay | Admitting: Internal Medicine

## 2022-02-10 NOTE — Telephone Encounter (Signed)
Spoke to [place] with [facility]. She is requesting refilling on Tussionex 10-38m/5ml Last refilled 01/15/2022 #1447mwith 0 refills.   Dr. RaChase Callerplease advise. Thanks

## 2022-02-10 NOTE — Telephone Encounter (Signed)
Ok to fill. Can hospice not do this? Why do they ask me to fill each time but I can do it

## 2022-02-11 NOTE — Telephone Encounter (Signed)
Attempted to call Tanner Bishop from [facility] and was placed on hold for 10 mins. Will try again ?

## 2022-02-13 ENCOUNTER — Encounter: Payer: Self-pay | Admitting: Adult Health

## 2022-02-13 ENCOUNTER — Telehealth: Admitting: Adult Health

## 2022-02-13 ENCOUNTER — Other Ambulatory Visit: Payer: Self-pay

## 2022-02-13 DIAGNOSIS — M47816 Spondylosis without myelopathy or radiculopathy, lumbar region: Secondary | ICD-10-CM | POA: Diagnosis not present

## 2022-02-13 DIAGNOSIS — J84112 Idiopathic pulmonary fibrosis: Secondary | ICD-10-CM

## 2022-02-13 DIAGNOSIS — Z515 Encounter for palliative care: Secondary | ICD-10-CM

## 2022-02-13 DIAGNOSIS — J9611 Chronic respiratory failure with hypoxia: Secondary | ICD-10-CM | POA: Diagnosis not present

## 2022-02-13 DIAGNOSIS — R053 Chronic cough: Secondary | ICD-10-CM

## 2022-02-13 MED ORDER — HYDROCOD POLI-CHLORPHE POLI ER 10-8 MG/5ML PO SUER: 5.0000 mL | Freq: Every evening | ORAL | 0 refills | Status: AC | PRN

## 2022-02-13 NOTE — Progress Notes (Signed)
Virtual Visit via Video Note ? ?I connected with Tanner Bishop on 02/13/22 at  2:00 PM EST by a video enabled telemedicine application and verified that I am speaking with the correct person using two identifiers. ? ?Location: ?Patient: Home  ?Provider: Office  ?  ?I discussed the limitations of evaluation and management by telemedicine and the availability of in person appointments. The patient expressed understanding and agreed to proceed. ? ?History of Present Illness: ?76 year old male followed for idiopathic pulmonary fibrosis, chronic respiratory failure on oxygen, pulmonary hypertension-who group 3 ? ?Today's video visit is a 1 month follow-up.  Patient has underlying idiopathic pulmonary fibrosis that is steroid and oxygen dependent.  He also has who group 3 pulmonary hypertension.  He is on home hospice.  Patient says he continues to be very short of breath with any type of minimum activity.  Typically dyspnea is manageable at rest but gets very winded if he tries to do any type of activities.  Currently on oxygen 3 to 4 L at rest and 6 to 8 L with activity.  He is on prednisone 5 mg daily.  Patient has a daily cough that is very aggravating.  He has been using Tussionex at bedtime.  Last visit oral morphine was added.  He says this has helped some.  He does need a refill today of his Tussionex. ?Patient says he was recently seen by primary provider with a fall with rib contusion.  He has used tramadol for rib pain.  Have advised him on potential complications of multiple pain medications.  Advised to use with caution.  He also has lorazepam and Neurontin.  Patient's family is in attendance for today's video visit.  Advised of the potential risk for confusion and balance.  Discussed risk for falls.   ?Today for the visit O2 saturations are 99% on 3.5 L at rest. ?He denies any hemoptysis, fever, chest pain, orthopnea or increased edema.  Appetite is fair. ? ?Past Medical History:  ?Diagnosis Date  ? Allergy    ? Arthritis   ? Atrial flutter ([place])   ? a. s/p ablation 03/2015 Dr Lovena Le  ? Cataract   ? removed both eyes  ? Dysrhythmia   ? GERD (gastroesophageal reflux disease)   ? H/O hiatal hernia   ? History of kidney stones   ? Hyperlipidemia   ? Injury of right hand   ? permanent damage after workplace 2009 and 2010 Dr Langston [facility] [employer] [facility]  ? Pulmonary fibrosis ([place])   ? sees Dr. Onnie Graham   ? Renal stone 06/2014  ? Ulcer   ? unresolved  ?  ?Current Outpatient Medications on File Prior to Visit  ?Medication Sig Dispense Refill  ? Cholecalciferol (VITAMIN D) 50 MCG (2000 UT) CAPS Take 2,000 Units by mouth daily.    ? flecainide (TAMBOCOR) 50 MG tablet Take 1 tablet (50 mg total) by mouth 3 (three) times daily. 270 tablet 6  ? fluticasone (FLONASE) 50 MCG/ACT nasal spray Place 2 sprays into both nostrils daily as needed for allergies. (Patient taking differently: Place 2 sprays into both nostrils 2 (two) times daily as needed for allergies.)    ? gabapentin (NEURONTIN) 100 MG capsule Take 100 mg by mouth 3 (three) times daily.    ? LORazepam (ATIVAN) 0.5 MG tablet SMARTSIG:1 Tablet(s) By Mouth Every 0-4 Hours PRN    ? meloxicam (MOBIC) 15 MG tablet Take 1 tablet (15 mg total) by mouth daily. 30 tablet 5  ? Morphine Sulfate (  MORPHINE CONCENTRATE) 10 mg / 0.5 ml concentrated solution SMARTSIG:0.25 Milliliter(s) By Mouth Every 4 Hours PRN    ? pantoprazole (PROTONIX) 40 MG tablet TAKE 1 TABLET BY MOUTH TWICE A DAY 180 tablet 2  ? predniSONE (DELTASONE) 5 MG tablet Take 1 tablet (5 mg total) by mouth daily with breakfast.    ? traMADol (ULTRAM) 50 MG tablet Take 50 mg by mouth every 4 (four) hours as needed.    ? zolpidem (AMBIEN) 5 MG tablet TAKE 1 TABLET BY MOUTH AT BEDTIME AS NEEDED FOR SLEEP 30 tablet 5  ? ?No current facility-administered medications on file prior to visit.  ? ? ?  ?Observations/Objective: ?Appears in no acute distress.  Chronically ill-appearing on oxygen. ? ?Assessment and Plan: ?IPF-appears  progressive-continue on prednisone 5 mg daily.  Continue with hospice care ? ?Chronic respiratory failure continue on oxygen to maintain O2 saturations greater than 88 to 90% ? ?Chronic cough-cough control regimen.  Patient may use morphine as needed for cough.  Advised patient against using Tussionex however says he has been on this for a very long time and it helps his cough.  1 refill was given PMP was reviewed.  We will leave additional refills to [name]  ? ? ?Plan  ?Patient Instructions  ?Continue on current regimen ?Continue with home hospice ?Continue on oxygen to maintain O2 saturations greater than 88 to 90% ?Follow-up with [name] with video visit in 4 weeks and As needed   ?Please contact office for sooner follow up if symptoms do not improve or worsen or seek emergency care  ?  ? ? ?Follow Up Instructions: ? ?  ?I discussed the assessment and treatment plan with the patient. The patient was provided an opportunity to ask questions and all were answered. The patient agreed with the plan and demonstrated an understanding of the instructions. ?  ?The patient was advised to call back or seek an in-person evaluation if the symptoms worsen or if the condition fails to improve as anticipated. ? ?I provided 25  minutes of non-face-to-face time during this encounter. ? ? ?Rexene Edison, NP ? ?

## 2022-02-13 NOTE — Patient Instructions (Addendum)
Continue on current regimen ?Continue with home hospice ?Continue on oxygen to maintain O2 saturations greater than 88 to 90% ?Follow-up with [name] with video visit in 4 weeks and As needed   ?Please contact office for sooner follow up if symptoms do not improve or worsen or seek emergency care  ? ?

## 2022-02-13 NOTE — Telephone Encounter (Signed)
Called and spoke with Izora Gala with [facility] letting her know the info from MR. Izora Gala said she would speak with the hospice MD there to get med refilled for pt. Nothing further needed. ?

## 2022-02-18 ENCOUNTER — Telehealth: Payer: Self-pay | Admitting: Internal Medicine

## 2022-02-18 NOTE — Telephone Encounter (Signed)
MR, please advise on this for pt and [facility] if you want [place] to order pt a face mask based off of the message sent. ?

## 2022-02-19 NOTE — Telephone Encounter (Signed)
Can certainly try facemask oxygen.  Hospice will need to provide him 1.  I think hospice should be able to do this even without talking first to me or him asking me. ?

## 2022-02-19 NOTE — Telephone Encounter (Signed)
Spoke with Izora Gala and notified of response per MR. She will take care of obtaining the mask. Nothing further needed.  ?

## 2022-02-24 ENCOUNTER — Other Ambulatory Visit: Payer: Self-pay | Admitting: Internal Medicine

## 2022-03-02 ENCOUNTER — Telehealth: Payer: Self-pay | Admitting: Internal Medicine

## 2022-03-02 NOTE — Telephone Encounter (Signed)
Fax received from [facility] that pt passed away 03-26-22 at 10:31am. ?

## 2022-03-04 ENCOUNTER — Ambulatory Visit: Payer: PPO | Admitting: Internal Medicine

## 2022-03-05 ENCOUNTER — Telehealth: Payer: Self-pay | Admitting: Internal Medicine

## 2022-03-05 NOTE — Telephone Encounter (Signed)
Called his wife Tanner Bishop and expressed condolences.  Patient spent the night before his passing away on his own in a recliner.  In the morning his son came and took the more for breakfast.  Upon return he collapsed by his recliner and he passed away.  The burial was yesterday.  The wife is still grieving. ? ? ? ? ?SIGNATURE  ? ? ?Dr. Brand Males, M.D., F.C.C.P,  ?Pulmonary and Critical Care Medicine ?Staff Physician, Faith ?Center Director - Interstitial Lung Disease  Program  ?[facility] ?[place], [place], [address] ? ?NPI Number:  NPI #[id number] ?DEA Number: [id number] ? ?Pager: [phone], If no answer  -> Check AMION or Try [phone] ?Telephone (clinical office): [phone] ?Telephone (research): [phone] ? ?3:19 PM ?03/05/2022 ? ?

## 2022-03-14 DEATH — deceased

## 2022-03-17 ENCOUNTER — Telehealth: Payer: PPO | Admitting: Internal Medicine

## 2022-08-05 ENCOUNTER — Telehealth: Payer: PPO
